# Patient Record
Sex: Female | Born: 1955
Health system: Southern US, Community
[De-identification: ages and names within clinical notes are randomized; demographics above are authoritative.]

## PROBLEM LIST (undated history)

## (undated) DIAGNOSIS — J189 Pneumonia, unspecified organism: Secondary | ICD-10-CM

## (undated) DIAGNOSIS — I639 Cerebral infarction, unspecified: Secondary | ICD-10-CM

## (undated) DIAGNOSIS — K766 Portal hypertension: Secondary | ICD-10-CM

## (undated) DIAGNOSIS — T4145XA Adverse effect of unspecified anesthetic, initial encounter: Secondary | ICD-10-CM

## (undated) DIAGNOSIS — R112 Nausea with vomiting, unspecified: Secondary | ICD-10-CM

## (undated) DIAGNOSIS — G459 Transient cerebral ischemic attack, unspecified: Secondary | ICD-10-CM

## (undated) DIAGNOSIS — G43909 Migraine, unspecified, not intractable, without status migrainosus: Secondary | ICD-10-CM

## (undated) DIAGNOSIS — K3189 Other diseases of stomach and duodenum: Secondary | ICD-10-CM

## (undated) DIAGNOSIS — G4700 Insomnia, unspecified: Secondary | ICD-10-CM

## (undated) DIAGNOSIS — F1011 Alcohol abuse, in remission: Secondary | ICD-10-CM

## (undated) DIAGNOSIS — B1920 Unspecified viral hepatitis C without hepatic coma: Secondary | ICD-10-CM

## (undated) DIAGNOSIS — Z9889 Other specified postprocedural states: Secondary | ICD-10-CM

## (undated) DIAGNOSIS — F319 Bipolar disorder, unspecified: Secondary | ICD-10-CM

## (undated) DIAGNOSIS — I8390 Asymptomatic varicose veins of unspecified lower extremity: Secondary | ICD-10-CM

## (undated) DIAGNOSIS — K219 Gastro-esophageal reflux disease without esophagitis: Secondary | ICD-10-CM

## (undated) DIAGNOSIS — J449 Chronic obstructive pulmonary disease, unspecified: Secondary | ICD-10-CM

## (undated) DIAGNOSIS — K7469 Other cirrhosis of liver: Secondary | ICD-10-CM

## (undated) DIAGNOSIS — B182 Chronic viral hepatitis C: Secondary | ICD-10-CM

## (undated) DIAGNOSIS — F17200 Nicotine dependence, unspecified, uncomplicated: Secondary | ICD-10-CM

## (undated) DIAGNOSIS — T8859XA Other complications of anesthesia, initial encounter: Secondary | ICD-10-CM

## (undated) DIAGNOSIS — F32A Depression, unspecified: Secondary | ICD-10-CM

## (undated) DIAGNOSIS — E039 Hypothyroidism, unspecified: Secondary | ICD-10-CM

## (undated) DIAGNOSIS — T7840XA Allergy, unspecified, initial encounter: Secondary | ICD-10-CM

## (undated) DIAGNOSIS — F329 Major depressive disorder, single episode, unspecified: Secondary | ICD-10-CM

## (undated) HISTORY — DX: Asymptomatic varicose veins of unspecified lower extremity: I83.90

## (undated) HISTORY — DX: Unspecified viral hepatitis C without hepatic coma: B19.20

## (undated) HISTORY — DX: Allergy, unspecified, initial encounter: T78.40XA

## (undated) HISTORY — DX: Bipolar disorder, unspecified: F31.9

## (undated) HISTORY — DX: Chronic viral hepatitis C: B18.2

## (undated) HISTORY — PX: APPENDECTOMY: SHX54

## (undated) HISTORY — DX: Migraine, unspecified, not intractable, without status migrainosus: G43.909

## (undated) HISTORY — DX: Unspecified viral hepatitis C without hepatic coma: K74.69

## (undated) HISTORY — PX: DIAGNOSTIC LAPAROSCOPY: SUR761

## (undated) HISTORY — DX: Other diseases of stomach and duodenum: K31.89

## (undated) HISTORY — PX: OTHER SURGICAL HISTORY: SHX169

## (undated) HISTORY — DX: Portal hypertension: K76.6

## (undated) HISTORY — DX: Alcohol abuse, in remission: F10.11

## (undated) HISTORY — DX: Insomnia, unspecified: G47.00

## (undated) HISTORY — DX: Nicotine dependence, unspecified, uncomplicated: F17.200

## (undated) HISTORY — DX: Cerebral infarction, unspecified: I63.9

## (undated) HISTORY — PX: LIVER BIOPSY: SHX301

## (undated) HISTORY — DX: Hypothyroidism, unspecified: E03.9

---

## 2008-07-20 ENCOUNTER — Emergency Department (HOSPITAL_COMMUNITY): Admission: EM | Admit: 2008-07-20 | Discharge: 2008-07-20 | Payer: Self-pay | Admitting: Family Medicine

## 2009-03-08 ENCOUNTER — Other Ambulatory Visit: Payer: Self-pay | Admitting: Specialist

## 2009-07-05 ENCOUNTER — Ambulatory Visit: Payer: Self-pay

## 2010-05-09 ENCOUNTER — Encounter (INDEPENDENT_AMBULATORY_CARE_PROVIDER_SITE_OTHER): Payer: Self-pay | Admitting: *Deleted

## 2010-05-09 ENCOUNTER — Other Ambulatory Visit (HOSPITAL_COMMUNITY): Payer: Self-pay | Admitting: Family Medicine

## 2010-05-09 DIAGNOSIS — K7031 Alcoholic cirrhosis of liver with ascites: Secondary | ICD-10-CM

## 2010-05-09 LAB — CONVERTED CEMR LAB
BUN: 15 mg/dL (ref 6–23)
Basophils Absolute: 0 10*3/uL (ref 0.0–0.1)
Basophils Relative: 0 % (ref 0–1)
CO2: 21 meq/L (ref 19–32)
Cholesterol: 165 mg/dL (ref 0–200)
Creatinine, Ser: 0.66 mg/dL (ref 0.40–1.20)
Eosinophils Absolute: 0.1 10*3/uL (ref 0.0–0.7)
Eosinophils Relative: 2 % (ref 0–5)
Glucose, Bld: 107 mg/dL — ABNORMAL HIGH (ref 70–99)
HCT: 51.1 % — ABNORMAL HIGH (ref 36.0–46.0)
HDL: 59 mg/dL (ref >39)
HIV: NONREACTIVE
Hemoglobin: 17.3 g/dL — ABNORMAL HIGH (ref 12.0–15.0)
Hep B S Ab: POSITIVE — AB
MCHC: 33.9 g/dL (ref 30.0–36.0)
MCV: 93.2 fL (ref 78.0–100.0)
Monocytes Absolute: 0.7 10*3/uL (ref 0.1–1.0)
Platelets: 142 10*3/uL — ABNORMAL LOW (ref 150–400)
RDW: 13.7 % (ref 11.5–15.5)
Sodium: 138 meq/L (ref 135–145)
T4, Total: 16.1 ug/dL — ABNORMAL HIGH (ref 5.0–12.5)
Total Bilirubin: 1 mg/dL (ref 0.3–1.2)
Total Protein: 7.7 g/dL (ref 6.0–8.3)
Triglycerides: 115 mg/dL (ref <150)
VLDL: 23 mg/dL (ref 0–40)

## 2010-05-12 ENCOUNTER — Ambulatory Visit (HOSPITAL_COMMUNITY): Payer: Self-pay

## 2010-05-16 ENCOUNTER — Ambulatory Visit (HOSPITAL_COMMUNITY)
Admission: RE | Admit: 2010-05-16 | Discharge: 2010-05-16 | Disposition: A | Discharge status: Home / Self Care | Payer: Self-pay | Source: Ambulatory Visit | Attending: Family Medicine | Admitting: Family Medicine

## 2010-05-16 DIAGNOSIS — R188 Other ascites: Secondary | ICD-10-CM | POA: Insufficient documentation

## 2010-05-16 DIAGNOSIS — B1920 Unspecified viral hepatitis C without hepatic coma: Secondary | ICD-10-CM | POA: Insufficient documentation

## 2010-05-16 DIAGNOSIS — K7460 Unspecified cirrhosis of liver: Secondary | ICD-10-CM | POA: Insufficient documentation

## 2010-05-16 DIAGNOSIS — K7031 Alcoholic cirrhosis of liver with ascites: Secondary | ICD-10-CM

## 2010-05-30 ENCOUNTER — Ambulatory Visit (HOSPITAL_COMMUNITY)
Admission: RE | Admit: 2010-05-30 | Discharge: 2010-05-30 | Disposition: A | Discharge status: Home / Self Care | Payer: Self-pay | Source: Ambulatory Visit | Attending: Family Medicine | Admitting: Family Medicine

## 2010-05-30 ENCOUNTER — Other Ambulatory Visit (HOSPITAL_COMMUNITY): Payer: Self-pay | Admitting: Family Medicine

## 2010-05-30 DIAGNOSIS — R52 Pain, unspecified: Secondary | ICD-10-CM

## 2010-05-30 DIAGNOSIS — M79609 Pain in unspecified limb: Secondary | ICD-10-CM | POA: Insufficient documentation

## 2010-07-18 ENCOUNTER — Other Ambulatory Visit (HOSPITAL_COMMUNITY): Payer: Self-pay | Admitting: Family Medicine

## 2010-07-18 ENCOUNTER — Ambulatory Visit: Payer: Self-pay

## 2010-07-18 DIAGNOSIS — E049 Nontoxic goiter, unspecified: Secondary | ICD-10-CM

## 2010-07-25 ENCOUNTER — Ambulatory Visit (HOSPITAL_COMMUNITY)
Admission: RE | Admit: 2010-07-25 | Discharge: 2010-07-25 | Disposition: A | Discharge status: Home / Self Care | Payer: Self-pay | Source: Ambulatory Visit | Attending: Family Medicine | Admitting: Family Medicine

## 2010-07-25 DIAGNOSIS — E042 Nontoxic multinodular goiter: Secondary | ICD-10-CM | POA: Insufficient documentation

## 2010-07-25 DIAGNOSIS — E049 Nontoxic goiter, unspecified: Secondary | ICD-10-CM

## 2010-08-01 ENCOUNTER — Other Ambulatory Visit (HOSPITAL_COMMUNITY): Payer: Self-pay | Admitting: Family Medicine

## 2010-08-01 ENCOUNTER — Other Ambulatory Visit: Payer: Self-pay | Admitting: Family Medicine

## 2010-08-01 DIAGNOSIS — E042 Nontoxic multinodular goiter: Secondary | ICD-10-CM

## 2010-08-15 ENCOUNTER — Other Ambulatory Visit (HOSPITAL_COMMUNITY): Payer: Self-pay | Admitting: Family Medicine

## 2010-08-15 ENCOUNTER — Ambulatory Visit (HOSPITAL_COMMUNITY)
Admission: RE | Admit: 2010-08-15 | Discharge: 2010-08-15 | Disposition: A | Discharge status: Home / Self Care | Payer: Self-pay | Source: Ambulatory Visit | Attending: Family Medicine | Admitting: Family Medicine

## 2010-08-15 ENCOUNTER — Other Ambulatory Visit: Payer: Self-pay | Admitting: Interventional Radiology

## 2010-08-15 DIAGNOSIS — E042 Nontoxic multinodular goiter: Secondary | ICD-10-CM

## 2010-08-15 DIAGNOSIS — E049 Nontoxic goiter, unspecified: Secondary | ICD-10-CM | POA: Insufficient documentation

## 2010-10-03 ENCOUNTER — Ambulatory Visit (INDEPENDENT_AMBULATORY_CARE_PROVIDER_SITE_OTHER): Payer: PRIVATE HEALTH INSURANCE | Admitting: Surgery

## 2010-10-03 ENCOUNTER — Encounter (INDEPENDENT_AMBULATORY_CARE_PROVIDER_SITE_OTHER): Payer: Self-pay | Admitting: Surgery

## 2010-10-03 VITALS — BP 108/68 | HR 68 | Temp 96.8°F | Ht 64.0 in | Wt 152.0 lb

## 2010-10-03 DIAGNOSIS — E049 Nontoxic goiter, unspecified: Secondary | ICD-10-CM

## 2010-10-03 NOTE — Progress Notes (Signed)
Chief Complaint  Patient presents with  . Thyroid Nodule    multinodular goiter with compressive symptoms    HISTORY: Patient is a 55 year old white female with long-standing multinodular thyroid goiter. This is been present for many years and was previously followed in [place]. Patient has never been on thyroid medication. She's had no prior head or neck surgery. There is no immediate family history of thyroid disease. There is no family history mother and her colopathy. Patient underwent thyroid ultrasound at [facility] imaging in May 2012. This showed an enlarged right thyroid lobe measuring 5.9 cm and an enlarged left thyroid lobe measuring 8.0 cm. Dominant nodules were noted bilaterally. Patient underwent ultrasound-guided fine-needle aspiration biopsies. Cytopathology was benign. Patient is now referred for evaluation for possible thyroidectomy based on compressive symptoms.   Past Medical History  Diagnosis Date  . Weight gain   . Fatigue   . Hep A w/o coma   . Hep C w/ coma, chronic   . Wears dentures   . Rash     and bruises easily  . Cough with sputum   . NVD (normal vaginal delivery)   . Poor appetite   . Liver disease   . Migraines   . Wears glasses   . Arthritis      Current outpatient prescriptions:benztropine (COGENTIN) 0.5 MG tablet, Take 0.5 mg by mouth daily.  , Disp: , Rfl: ;  citalopram (CELEXA) 40 MG tablet, Take 40 mg by mouth daily.  , Disp: , Rfl: ;  haloperidol (HALDOL) 1 MG tablet, Take 1 mg by mouth 2 (two) times daily.  , Disp: , Rfl: ;  hydrOXYzine (VISTARIL) 50 MG capsule, Take 50 mg by mouth Nightly.  , Disp: , Rfl:  lamoTRIgine (LAMICTAL) 150 MG tablet, Take 150 mg by mouth daily. Patient taking 1.5 daily  , Disp: , Rfl: ;  zolpidem (AMBIEN) 10 MG tablet, Take 10 mg by mouth at bedtime as needed.  , Disp: , Rfl:    Allergies  Allergen Reactions  . Demerol Anaphylaxis  . Penicillins Anaphylaxis, Hives and Itching  . Percocet  (Oxycodone-Acetaminophen) Hives, Shortness Of Breath and Itching  . Percodan (Oxycodone-Aspirin) Hives, Shortness Of Breath and Itching  . Aspirin Nausea And Vomiting  . Morphine And Related Other (See Comments)    Unknown by patient has been a while since reaction has occurred.     Family History  Problem Relation Age of Onset  . Other Mother     pacemaker  . Cancer Father     bladder  . Diabetes Father   . Hypertension Father      History  Substance Use Topics  . Smoking status: Current Everyday Smoker -- 0.5 packs/day  . Smokeless tobacco: Not on file  . Alcohol Use: Yes     occasional     PERTINENT POSITIVES FROM REVIEW OF SYSTEMS: Patient notes moderate compressive symptoms including dysphagia with most meals, occasional choking sensation, nocturnal shortness of breath awakening her from sleep at least twice weekly, and a persistent moderate globus sensation. Patient denies tremors. She denies palpitations.   EXAM: Filed Vitals:   10/03/10 1649  BP: 108/68  Pulse: 68  Temp: 96.8 F (36 C)   HEENT shows her to be normocephalic. Sclerae clear. Pupils equal and reactive. Dentition fair. Mucous membranes moist. Voice quality normal. Neck shows obvious asymmetry with the left thyroid lobe being markedly larger than the right palpation reveals a firm multinodular thyroid gland larger on the left than on  the right. It is mildly tender. There is no associated lymphadenopathy. There is no anterior or posterior cervical lymphadenopathy. There are no supraclavicular masses. Chest is clear to auscultation with a few scattered rhonchi. Cardiac exam shows regular rate and rhythm without significant murmur Extremities are nontender without edema. No tenderness. Peripheral pulses are full. Neurologically the patient is intact without focal deficit. There's no sign of tremor.   LABORATORY RESULTS: See E-Chart for most recent results   RADIOLOGY RESULTS: See E-Chart or I-Site  for most recent results   IMPRESSION: Multinodular thyroid goiter with benign cytopathology, moderate compressive symptoms   PLAN: The patient and I had a lengthy discussion regarding the above findings and symptoms. I explained to her that at this point she had essentially 2 options on how to proceed. First she could pursue a course of observation. This would require followup in approximately 6 months. We would repeat her thyroid ultrasound. We would monitor her thyroid function test it the laboratory. We would perform physical examinations. If her gland showed changes that were worrisome for neoplasm or if her symptoms of compression continued to become more severe, then she would require thyroidectomy.  The second option would be to proceed with thyroidectomy at this time. If the patient elected surgery I would recommend total thyroidectomy. We discussed the risk and benefits of the procedure including the risk of injury to parathyroid glands and the risk of injury to recurrent laryngeal nerves. We discussed the hospital stay to be anticipated and her recovery. We discussed the need for lifelong thyroid hormone replacement.  At this time the patient is not interested in proceeding immediately to surgery. She is comfortable with the plan for continued observation. We will obtain a TSH level at the laboratory. We will schedule her for followup in his office in 6 months.

## 2010-10-25 HISTORY — PX: ESOPHAGOGASTRODUODENOSCOPY: SHX1529

## 2011-03-06 ENCOUNTER — Ambulatory Visit (HOSPITAL_COMMUNITY)
Admission: RE | Admit: 2011-03-06 | Discharge: 2011-03-06 | Disposition: A | Discharge status: Home / Self Care | Payer: Self-pay | Source: Ambulatory Visit | Attending: Family Medicine | Admitting: Family Medicine

## 2011-03-06 ENCOUNTER — Other Ambulatory Visit (HOSPITAL_COMMUNITY): Payer: Self-pay | Admitting: Family Medicine

## 2011-03-06 DIAGNOSIS — R52 Pain, unspecified: Secondary | ICD-10-CM

## 2011-03-06 DIAGNOSIS — M25569 Pain in unspecified knee: Secondary | ICD-10-CM | POA: Insufficient documentation

## 2011-03-23 ENCOUNTER — Other Ambulatory Visit (INDEPENDENT_AMBULATORY_CARE_PROVIDER_SITE_OTHER): Payer: Self-pay | Admitting: Surgery

## 2011-03-23 ENCOUNTER — Ambulatory Visit
Admission: RE | Admit: 2011-03-23 | Discharge: 2011-03-23 | Disposition: A | Discharge status: Home / Self Care | Payer: No Typology Code available for payment source | Source: Ambulatory Visit | Attending: Surgery | Admitting: Surgery

## 2011-03-23 DIAGNOSIS — E042 Nontoxic multinodular goiter: Secondary | ICD-10-CM

## 2011-03-27 DIAGNOSIS — K3189 Other diseases of stomach and duodenum: Secondary | ICD-10-CM

## 2011-03-27 HISTORY — DX: Other diseases of stomach and duodenum: K31.89

## 2011-04-03 ENCOUNTER — Encounter (INDEPENDENT_AMBULATORY_CARE_PROVIDER_SITE_OTHER): Payer: PRIVATE HEALTH INSURANCE | Admitting: Surgery

## 2011-04-20 ENCOUNTER — Encounter (INDEPENDENT_AMBULATORY_CARE_PROVIDER_SITE_OTHER): Payer: Self-pay | Admitting: Surgery

## 2011-04-20 ENCOUNTER — Ambulatory Visit (INDEPENDENT_AMBULATORY_CARE_PROVIDER_SITE_OTHER): Payer: PRIVATE HEALTH INSURANCE | Admitting: Surgery

## 2011-04-20 VITALS — BP 124/76 | HR 72 | Temp 98.2°F | Resp 18 | Ht 64.0 in | Wt 151.2 lb

## 2011-04-20 DIAGNOSIS — E049 Nontoxic goiter, unspecified: Secondary | ICD-10-CM

## 2011-04-20 NOTE — Progress Notes (Signed)
Chief Complaint  Patient presents with  . Follow-up    thyroid goiter   HISTORY: Patient is a 55-year-old white female previously evaluated for bilateral thyroid nodules, multinodular goiter, and moderate compressive symptoms. At her last visit we discussed observation versus thyroidectomy. Patient decided to observe the gland with a followup thyroid ultrasound. Ultrasound was performed in December 2012 and shows interval increase in size of 3 dominant nodules in the right thyroid lobe and persistent abnormality of a markedly enlarged left thyroid lobe.  Patient believes the thyroid gland has enlarged over the past 6 months. She notes no problems with dysphagia had practically every meal. She complains of nighttime choking sensation and chronic cough. Patient now presents for consideration for thyroidectomy.  Past Medical History  Diagnosis Date  . Weight gain   . Fatigue   . Hep A w/o coma   . Hep C w/ coma, chronic   . Wears dentures   . Rash     and bruises easily  . Cough with sputum   . NVD (normal vaginal delivery)   . Poor appetite   . Liver disease   . Migraines   . Wears glasses   . Arthritis   . Thyroid disease     thyroid goiter  . Bipolar 1 disorder   . Insomnia      Current Outpatient Prescriptions  Medication Sig Dispense Refill  . benztropine (COGENTIN) 0.5 MG tablet Take 0.5 mg by mouth daily.        . citalopram (CELEXA) 40 MG tablet Take 40 mg by mouth daily.        . haloperidol (HALDOL) 1 MG tablet Take 1 mg by mouth 2 (two) times daily.        . hydrOXYzine (VISTARIL) 50 MG capsule Take 50 mg by mouth Nightly.        . lamoTRIgine (LAMICTAL) 150 MG tablet Take 150 mg by mouth daily. Patient taking 1.5 daily        . zolpidem (AMBIEN) 10 MG tablet Take 10 mg by mouth at bedtime as needed.           Allergies  Allergen Reactions  . Demerol Anaphylaxis  . Penicillins Anaphylaxis, Hives and Itching  . Percocet (Oxycodone-Acetaminophen) Hives,  Shortness Of Breath and Itching  . Percodan (Oxycodone-Aspirin) Hives, Shortness Of Breath and Itching  . Aspirin Nausea And Vomiting  . Morphine And Related Other (See Comments)    Unknown by patient has been a while since reaction has occurred.     Family History  Problem Relation Age of Onset  . Other Mother     pacemaker  . Cancer Father     bladder  . Diabetes Father   . Hypertension Father      History   Social History  . Marital Status: Legally Separated    Spouse Name: N/A    Number of Children: N/A  . Years of Education: N/A   Social History Main Topics  . Smoking status: Current Everyday Smoker -- 0.5 packs/day  . Smokeless tobacco: None  . Alcohol Use: Yes     occasional  . Drug Use: No  . Sexually Active: None   Other Topics Concern  . None   Social History Narrative  . None    REVIEW OF SYSTEMS - PERTINENT POSITIVES ONLY: Moderate dysphagia at most meals. Nocturnal choking sensation and shortness of breath. Chronic cough.  EXAM: Filed Vitals:   04/20/11 1015  BP: 124/76  Pulse: 72    Temp: 98.2 F (36.8 C)  Resp: 18    HEENT: normocephalic; pupils equal and reactive; sclerae clear; dentition good; mucous membranes moist NECK:  Enlarged thyroid with multiple nodules; dominant mass left lobe approx 6 cm in size; asymmetric on extension; no palpable anterior or posterior cervical lymphadenopathy; no supraclavicular masses; no tenderness CHEST: clear to auscultation bilaterally without rales, rhonchi, or wheezes CARDIAC: regular rate and rhythm without significant murmur; peripheral pulses are full ABDOMEN: soft without distension; bowel sounds present; no mass; no hepatosplenomegaly; no hernia EXT:  non-tender without edema; no deformity NEURO: no gross focal deficits; no sign of tremor   LABORATORY RESULTS: See [facility] ([facility]L-Epic) for most recent results   RADIOLOGY RESULTS: See [facility] ([facility]-Epic) for most recent  results   IMPRESSION: Multinodular thyroid goiter, interval enlargement of thyroid nodules, progression of compressive symptoms.  PLAN: The patient and I reviewed the above studies and issues. I have recommended proceeding with total thyroidectomy at this time. We again was reviewed the procedure of total thyroidectomy. We discussed the risk and benefits including potential for injury to laryngeal nerves and the parathyroid glands. We discussed the location of the surgical incision. We discussed the hospital stay to be anticipated. We discussed the need for lifelong thyroid hormone replacement therapy. Patient understands and wishes to proceed.  We will make arrangements for surgery in the near future.  The risks and benefits of the procedure have been discussed at length with the patient.  The patient understands the proposed procedure, potential alternative treatments, and the course of recovery to be expected.  All of the patient's questions have been answered at this time.  The patient wishes to proceed with surgery and will schedule a date for their procedure through our office staff.   Korbyn Vanes M. Taralynn Quiett, MD, FACS General & Endocrine Surgery [facility]   Visit Diagnoses: 1. Thyroid goiter, multinodular     Primary Care Physician: SHAW,EVA, MD, MD   

## 2011-04-20 NOTE — Patient Instructions (Signed)
[facility] [phone]  THYROID & PARATHYROID SURGERY -- POST OP INSTRUCTIONS  Always review your discharge instruction sheet from the facility where your surgery was performed.  1. A prescription for pain medication may be given to you upon discharge.  Take your pain medication as prescribed, if needed.  If narcotic pain medicine is not needed, then you may take acetaminophen (Tylenol) or ibuprofen (Advil) as needed. 2. Take your usually prescribed medications unless otherwise directed. 3. If you need a refill on your pain medication, please contact your pharmacy. They will contact our office to request authorization.  Prescriptions will not be processed after 5 pm or on weekends. 4. Start with a light diet upon arrival home, such as soup and crackers, etc.  Be sure to drink pleny of fluids daily.  Resume your normal diet the day after surgery. 5. Most patients will experience some swelling and bruising on the chest and neck area.  Ice packs will help.  Swelling and bruising can take several days to resolve.  6. It is common to experience some constipation if taking pain medication after surgery.  Increasing fluid intake and taking a stool softener will usually help or prevent this problem.  A mild laxative (Milk of Magnesia or Miralax) should be taken according to package directions if there are no bowel movements after 48 hours. 7. You may remove your bandages 24-48 hours after surgery, and you may shower at that time.  You have steri-strips (small skin tapes) in place directly over the incision.  These strips should be left on the skin for 7-10 days and then removed. 8. You may resume regular (light) daily activities beginning the next day-such as daily self-care, walking, climbing stairs-gradually increasing activities as tolerated.  You may have sexual intercourse when it is comfortable.  Refrain from any heavy lifting or straining until approved by your doctor.  You may drive  when you no longer are taking prescription pain medication, you can comfortably wear a seatbelt, and you can safely maneuver your car and apply brakes. 9. You should see your doctor in the office for a follow-up appointment approximately two weeks after your surgery.  Make sure that you call for this appointment within a day or two after you arrive home to insure a convenient appointment time.  WHEN TO CALL YOUR DOCTOR: 1. Fever over 101.5 2. Inability to urinate 3. Nausea and/or vomiting - persistent 4. Extreme swelling or bruising 5. Continued bleeding from incision 6. Increased pain, redness, or drainage from the incision 7. Difficulty swallowing or breathing 8. Muscle cramping or spasms 9. Numbness or tingling in hands or feet or around lips  The clinic staff is available to answer your questions during regular business hours.  Please don't hesitate to call and ask to speak to one of the nurses if you have concerns.  [email or web address]   

## 2011-05-14 ENCOUNTER — Encounter (HOSPITAL_COMMUNITY): Payer: Self-pay | Admitting: Pharmacy Technician

## 2011-05-14 ENCOUNTER — Ambulatory Visit (HOSPITAL_COMMUNITY)
Admission: RE | Admit: 2011-05-14 | Discharge: 2011-05-14 | Disposition: A | Discharge status: Home / Self Care | Payer: Self-pay | Source: Ambulatory Visit | Attending: Surgery | Admitting: Surgery

## 2011-05-14 ENCOUNTER — Other Ambulatory Visit: Payer: Self-pay

## 2011-05-14 ENCOUNTER — Encounter (HOSPITAL_COMMUNITY): Payer: Self-pay

## 2011-05-14 ENCOUNTER — Encounter (HOSPITAL_COMMUNITY)
Admission: RE | Admit: 2011-05-14 | Discharge: 2011-05-14 | Disposition: A | Discharge status: Home / Self Care | Payer: Self-pay | Source: Ambulatory Visit | Attending: Surgery | Admitting: Surgery

## 2011-05-14 DIAGNOSIS — Z01811 Encounter for preprocedural respiratory examination: Secondary | ICD-10-CM | POA: Insufficient documentation

## 2011-05-14 DIAGNOSIS — F319 Bipolar disorder, unspecified: Secondary | ICD-10-CM | POA: Insufficient documentation

## 2011-05-14 DIAGNOSIS — K7460 Unspecified cirrhosis of liver: Secondary | ICD-10-CM | POA: Insufficient documentation

## 2011-05-14 DIAGNOSIS — Z01812 Encounter for preprocedural laboratory examination: Secondary | ICD-10-CM | POA: Insufficient documentation

## 2011-05-14 DIAGNOSIS — E042 Nontoxic multinodular goiter: Secondary | ICD-10-CM | POA: Insufficient documentation

## 2011-05-14 DIAGNOSIS — K219 Gastro-esophageal reflux disease without esophagitis: Secondary | ICD-10-CM | POA: Insufficient documentation

## 2011-05-14 DIAGNOSIS — B1920 Unspecified viral hepatitis C without hepatic coma: Secondary | ICD-10-CM | POA: Insufficient documentation

## 2011-05-14 HISTORY — DX: Gastro-esophageal reflux disease without esophagitis: K21.9

## 2011-05-14 HISTORY — DX: Depression, unspecified: F32.A

## 2011-05-14 HISTORY — DX: Other specified postprocedural states: R11.2

## 2011-05-14 HISTORY — DX: Major depressive disorder, single episode, unspecified: F32.9

## 2011-05-14 HISTORY — DX: Other complications of anesthesia, initial encounter: T88.59XA

## 2011-05-14 HISTORY — DX: Other specified postprocedural states: Z98.890

## 2011-05-14 HISTORY — DX: Adverse effect of unspecified anesthetic, initial encounter: T41.45XA

## 2011-05-14 LAB — CBC
HCT: 44.5 % (ref 36.0–46.0)
Hemoglobin: 15.3 g/dL — ABNORMAL HIGH (ref 12.0–15.0)
MCV: 91.2 fL (ref 78.0–100.0)
RDW: 13.1 % (ref 11.5–15.5)
WBC: 5.3 10*3/uL (ref 4.0–10.5)

## 2011-05-14 LAB — COMPREHENSIVE METABOLIC PANEL
Alkaline Phosphatase: 77 U/L (ref 39–117)
BUN: 12 mg/dL (ref 6–23)
CO2: 27 mEq/L (ref 19–32)
Chloride: 102 mEq/L (ref 96–112)
Creatinine, Ser: 0.73 mg/dL (ref 0.50–1.10)
GFR calc Af Amer: >90 mL/min (ref >90)
GFR calc non Af Amer: >90 mL/min (ref >90)
Glucose, Bld: 97 mg/dL (ref 70–99)
Potassium: 4.2 mEq/L (ref 3.5–5.1)
Total Bilirubin: 0.8 mg/dL (ref 0.3–1.2)

## 2011-05-14 LAB — SURGICAL PCR SCREEN: Staphylococcus aureus: INVALID — AB

## 2011-05-14 NOTE — Pre-Procedure Instructions (Signed)
Per LAB- INVALID MRSA RESULTS- SENT OFF TO LAB FOR FURTHER STUDIES

## 2011-05-14 NOTE — Pre-Procedure Instructions (Signed)
1610- abnormal platelets called to Dr Sid Falcon nurse Zella Ball at [facility]

## 2011-05-14 NOTE — Patient Instructions (Signed)
20 [name]  05/14/2011   Your procedure is scheduled on:  05/17/11  Thursday  Surgery [id number]  Report to [facility] at   0515    AM.  Call this number if you have problems the morning of surgery: [phone]      Remember:  STOP ASPIRINS,ANTIINFLAMMATORIES,VITAMINS 7 DAYS BEFORE SURGERY   Do not eat food:After Midnight. Wednesday night  May have clear liquids: none after MIDNIGHT Wednesday NIGHT  Clear liquids include soda, tea, black coffee, apple or grape juice, broth.  Take these medicines the morning of surgery with A SIP OF WATER: COGENTIN,CELEXA,HALDOL,LAMICTEL WITH SIP WATER   Do not wear jewelry, make-up or nail polish.  Do not wear lotions, powders, or perfumes. You may wear deodorant.  Do not shave 48 hours prior to surgery.  Do not bring valuables to the hospital.  Contacts, dentures or bridgework may not be worn into surgery.  Leave suitcase in the car. After surgery it may be brought to your room.  For patients admitted to the hospital, checkout time is 11:00 AM the day of discharge.   Patients discharged the day of surgery will not be allowed to drive home.  Name and phone number of your driver:    friend                                                                  Special Instructions: CHG Shower Use Special Wash: 1/2 bottle night before surgery and 1/2 bottle morning of surgery. REGULAR SOAP FACE AND PRIVATES              LADIES- NO SHAVING 48 HOURS BEFORE USING BETASEPT SOAP.                   Please read over the following fact sheets that you were given: MRSA Information

## 2011-05-15 ENCOUNTER — Encounter (HOSPITAL_COMMUNITY): Payer: Self-pay

## 2011-05-15 NOTE — Progress Notes (Signed)
Quick Note:  These results are acceptable for scheduled surgery. TMG ______ 

## 2011-05-15 NOTE — Progress Notes (Signed)
Faxed to [facility] 05/15/2011.

## 2011-05-15 NOTE — Progress Notes (Signed)
Faxed to [facility] 05/15/2011. 

## 2011-05-16 NOTE — Pre-Procedure Instructions (Signed)
Per lab at 1525-  mrsa result still preliminary- chart flagged for checking by short stay nurses

## 2011-05-16 NOTE — Anesthesia Preprocedure Evaluation (Addendum)
Anesthesia Evaluation  Patient identified by MRN, date of birth, ID band Patient awake    Reviewed: Allergy & Precautions, H&P , NPO status , Patient's Chart, lab work & pertinent test results  History of Anesthesia Complications (+) PONV and PROLONGED EMERGENCE  Airway Mallampati: II TM Distance: >3 FB Neck ROM: full    Dental  (+) Lower Dentures and Upper Dentures   Pulmonary Current Smoker,  clear to auscultation  Pulmonary exam normal       Cardiovascular Exercise Tolerance: Good neg cardio ROS regular Normal    Neuro/Psych  Headaches, PSYCHIATRIC DISORDERS Depression Bipolar Disorder Negative Neurological ROS  Negative Psych ROS   GI/Hepatic negative GI ROS, GERD-  ,(+) Cirrhosis -       , Hepatitis -, C  Endo/Other  Negative Endocrine ROS  Renal/GU negative Renal ROS  Genitourinary negative   Musculoskeletal   Abdominal   Peds  Hematology negative hematology ROS (+)   Anesthesia Other Findings   Reproductive/Obstetrics negative OB ROS                          Anesthesia Physical Anesthesia Plan  ASA: III  Anesthesia Plan: General   Post-op Pain Management:    Induction: Intravenous  Airway Management Planned: Oral ETT  Additional Equipment:   Intra-op Plan:   Post-operative Plan: Extubation in OR  Informed Consent: I have reviewed the patients History and Physical, chart, labs and discussed the procedure including the risks, benefits and alternatives for the proposed anesthesia with the patient or authorized representative who has indicated his/her understanding and acceptance.   Dental Advisory Given  Plan Discussed with: CRNA and Surgeon  Anesthesia Plan Comments: (Major problems with narcotics in past including anaphylaxis with demerol and hives with oxycodone.)       Anesthesia Quick Evaluation

## 2011-05-17 ENCOUNTER — Ambulatory Visit (HOSPITAL_COMMUNITY)
Admission: RE | Admit: 2011-05-17 | Discharge: 2011-05-20 | Disposition: A | Discharge status: Home Health | Payer: Medicaid Other | Source: Ambulatory Visit | Attending: Surgery | Admitting: Surgery

## 2011-05-17 ENCOUNTER — Other Ambulatory Visit (INDEPENDENT_AMBULATORY_CARE_PROVIDER_SITE_OTHER): Payer: Self-pay | Admitting: Surgery

## 2011-05-17 ENCOUNTER — Ambulatory Visit (HOSPITAL_COMMUNITY): Payer: Medicaid Other | Admitting: Anesthesiology

## 2011-05-17 ENCOUNTER — Encounter (HOSPITAL_COMMUNITY)
Admission: RE | Disposition: A | Discharge status: Home Health | Payer: Self-pay | Source: Ambulatory Visit | Attending: Surgery

## 2011-05-17 ENCOUNTER — Encounter (HOSPITAL_COMMUNITY): Payer: Self-pay | Admitting: *Deleted

## 2011-05-17 ENCOUNTER — Encounter (HOSPITAL_COMMUNITY): Payer: Self-pay | Admitting: Anesthesiology

## 2011-05-17 DIAGNOSIS — Z8619 Personal history of other infectious and parasitic diseases: Secondary | ICD-10-CM | POA: Insufficient documentation

## 2011-05-17 DIAGNOSIS — Z79899 Other long term (current) drug therapy: Secondary | ICD-10-CM | POA: Insufficient documentation

## 2011-05-17 DIAGNOSIS — R5383 Other fatigue: Secondary | ICD-10-CM | POA: Insufficient documentation

## 2011-05-17 DIAGNOSIS — R5381 Other malaise: Secondary | ICD-10-CM | POA: Insufficient documentation

## 2011-05-17 DIAGNOSIS — E042 Nontoxic multinodular goiter: Secondary | ICD-10-CM

## 2011-05-17 DIAGNOSIS — E049 Nontoxic goiter, unspecified: Secondary | ICD-10-CM

## 2011-05-17 DIAGNOSIS — F319 Bipolar disorder, unspecified: Secondary | ICD-10-CM | POA: Insufficient documentation

## 2011-05-17 DIAGNOSIS — Z01812 Encounter for preprocedural laboratory examination: Secondary | ICD-10-CM | POA: Insufficient documentation

## 2011-05-17 DIAGNOSIS — R635 Abnormal weight gain: Secondary | ICD-10-CM | POA: Insufficient documentation

## 2011-05-17 HISTORY — PX: THYROIDECTOMY: SHX17

## 2011-05-17 LAB — MRSA CULTURE

## 2011-05-17 SURGERY — THYROIDECTOMY
Anesthesia: General | Site: Neck | Wound class: Clean

## 2011-05-17 MED ORDER — DEXAMETHASONE SODIUM PHOSPHATE 10 MG/ML IJ SOLN
INTRAMUSCULAR | Status: DC | PRN
  Administered 2011-05-17: 8 mg via INTRAVENOUS

## 2011-05-17 MED ORDER — NEOSTIGMINE METHYLSULFATE 1 MG/ML IJ SOLN
INTRAMUSCULAR | Status: DC | PRN
  Administered 2011-05-17: 3 mg via INTRAVENOUS

## 2011-05-17 MED ORDER — ZOLPIDEM TARTRATE 10 MG PO TABS: 10.0000 mg | ORAL_TABLET | Freq: Every evening | ORAL | Status: DC | PRN

## 2011-05-17 MED ORDER — PROMETHAZINE HCL 25 MG/ML IJ SOLN: 6.2500 mg | INTRAMUSCULAR | Status: DC | PRN

## 2011-05-17 MED ORDER — LACTATED RINGERS IV SOLN: INTRAVENOUS | Status: DC

## 2011-05-17 MED ORDER — PROPOFOL 10 MG/ML IV BOLUS
INTRAVENOUS | Status: DC | PRN
  Administered 2011-05-17: 150 mg via INTRAVENOUS

## 2011-05-17 MED ORDER — HALOPERIDOL 1 MG PO TABS
1.0000 mg | ORAL_TABLET | Freq: Three times a day (TID) | ORAL | Status: DC
  Filled 2011-05-17 (×11): qty 1

## 2011-05-17 MED ORDER — PROMETHAZINE HCL 25 MG/ML IJ SOLN
12.5000 mg | Freq: Four times a day (QID) | INTRAMUSCULAR | Status: DC | PRN
  Administered 2011-05-18: 12.5 mg via INTRAVENOUS
  Filled 2011-05-17: qty 1

## 2011-05-17 MED ORDER — ROCURONIUM BROMIDE 100 MG/10ML IV SOLN
INTRAVENOUS | Status: DC | PRN
  Administered 2011-05-17: 30 mg via INTRAVENOUS

## 2011-05-17 MED ORDER — CALCIUM CARBONATE 1250 (500 CA) MG PO TABS
1000.0000 mg | ORAL_TABLET | Freq: Three times a day (TID) | ORAL | Status: DC
  Filled 2011-05-17 (×6): qty 2

## 2011-05-17 MED ORDER — FENTANYL CITRATE 0.05 MG/ML IJ SOLN
25.0000 ug | INTRAMUSCULAR | Status: DC | PRN
  Administered 2011-05-17: 50 ug via INTRAVENOUS

## 2011-05-17 MED ORDER — CITALOPRAM HYDROBROMIDE 40 MG PO TABS
40.0000 mg | ORAL_TABLET | Freq: Every day | ORAL | Status: DC
  Administered 2011-05-17: 40 mg via ORAL
  Filled 2011-05-17 (×4): qty 1

## 2011-05-17 MED ORDER — MIDAZOLAM HCL 5 MG/5ML IJ SOLN
INTRAMUSCULAR | Status: DC | PRN
  Administered 2011-05-17: 2 mg via INTRAVENOUS

## 2011-05-17 MED ORDER — DROPERIDOL 2.5 MG/ML IJ SOLN
INTRAMUSCULAR | Status: DC | PRN
  Administered 2011-05-17: 0.625 mg via INTRAVENOUS

## 2011-05-17 MED ORDER — HYDROXYZINE PAMOATE 50 MG PO CAPS: 50.0000 mg | ORAL_CAPSULE | Freq: Every day | ORAL | Status: DC

## 2011-05-17 MED ORDER — HYDROXYZINE HCL 25 MG PO TABS
25.0000 mg | ORAL_TABLET | Freq: Every day | ORAL | Status: DC
  Filled 2011-05-17 (×4): qty 1

## 2011-05-17 MED ORDER — KCL IN DEXTROSE-NACL 20-5-0.45 MEQ/L-%-% IV SOLN
INTRAVENOUS | Status: DC
  Administered 2011-05-17 – 2011-05-19 (×3): via INTRAVENOUS
  Filled 2011-05-17 (×4): qty 1000

## 2011-05-17 MED ORDER — ONDANSETRON HCL 4 MG/2ML IJ SOLN
INTRAMUSCULAR | Status: DC | PRN
  Administered 2011-05-17: 4 mg via INTRAVENOUS

## 2011-05-17 MED ORDER — ACETAMINOPHEN 325 MG PO TABS
650.0000 mg | ORAL_TABLET | ORAL | Status: DC | PRN
  Administered 2011-05-18 – 2011-05-19 (×2): 650 mg via ORAL
  Filled 2011-05-17 (×2): qty 2

## 2011-05-17 MED ORDER — LACTATED RINGERS IV SOLN
INTRAVENOUS | Status: DC | PRN
  Administered 2011-05-17: 07:00:00 via INTRAVENOUS

## 2011-05-17 MED ORDER — GLYCOPYRROLATE 0.2 MG/ML IJ SOLN
INTRAMUSCULAR | Status: DC | PRN
  Administered 2011-05-17: .4 mg via INTRAVENOUS

## 2011-05-17 MED ORDER — LIDOCAINE HCL (CARDIAC) 20 MG/ML IV SOLN
INTRAVENOUS | Status: DC | PRN
  Administered 2011-05-17: 75 mg via INTRAVENOUS

## 2011-05-17 MED ORDER — LAMOTRIGINE 150 MG PO TABS
150.0000 mg | ORAL_TABLET | Freq: Every day | ORAL | Status: DC
  Administered 2011-05-17: 150 mg via ORAL
  Filled 2011-05-17 (×4): qty 1

## 2011-05-17 MED ORDER — BENZTROPINE MESYLATE 0.5 MG PO TABS
0.5000 mg | ORAL_TABLET | Freq: Every day | ORAL | Status: DC
  Administered 2011-05-17: 0.5 mg via ORAL
  Filled 2011-05-17 (×4): qty 1

## 2011-05-17 MED ORDER — FENTANYL CITRATE 0.05 MG/ML IJ SOLN
INTRAMUSCULAR | Status: DC | PRN
  Administered 2011-05-17: 100 ug via INTRAVENOUS
  Administered 2011-05-17 (×2): 50 ug via INTRAVENOUS
  Administered 2011-05-17: 100 ug via INTRAVENOUS
  Administered 2011-05-17: 50 ug via INTRAVENOUS

## 2011-05-17 MED ORDER — TRAMADOL HCL 50 MG PO TABS: 50.0000 mg | ORAL_TABLET | Freq: Four times a day (QID) | ORAL | Status: DC | PRN

## 2011-05-17 MED ORDER — CIPROFLOXACIN IN D5W 400 MG/200ML IV SOLN
400.0000 mg | INTRAVENOUS | Status: AC
  Administered 2011-05-17: 400 mg via INTRAVENOUS

## 2011-05-17 SURGICAL SUPPLY — 40 items
ATTRACTOMAT 16X20 MAGNETIC DRP (DRAPES) ×2 IMPLANT
BENZOIN TINCTURE PRP APPL 2/3 (GAUZE/BANDAGES/DRESSINGS) ×2 IMPLANT
BLADE HEX COATED 2.75 (ELECTRODE) ×2 IMPLANT
BLADE SURG 15 STRL LF DISP TIS (BLADE) ×1 IMPLANT
BLADE SURG 15 STRL SS (BLADE) ×1
CANISTER SUCTION 2500CC (MISCELLANEOUS) ×2 IMPLANT
CHLORAPREP W/TINT 10.5 ML (MISCELLANEOUS) ×2 IMPLANT
CLIP TI MEDIUM 6 (CLIP) ×4 IMPLANT
CLIP TI WIDE RED SMALL 6 (CLIP) ×4 IMPLANT
CLOSURE STERI STRIP 1/2 X4 (GAUZE/BANDAGES/DRESSINGS) ×2 IMPLANT
CLOTH BEACON ORANGE TIMEOUT ST (SAFETY) ×2 IMPLANT
DISSECTOR ROUND CHERRY 3/8 STR (MISCELLANEOUS) IMPLANT
DRAPE PED LAPAROTOMY (DRAPES) ×2 IMPLANT
DRESSING SURGICEL FIBRLLR 1X2 (HEMOSTASIS) ×1 IMPLANT
DRSG SURGICEL FIBRILLAR 1X2 (HEMOSTASIS) ×2
ELECT REM PT RETURN 9FT ADLT (ELECTROSURGICAL) ×2
ELECTRODE REM PT RTRN 9FT ADLT (ELECTROSURGICAL) ×1 IMPLANT
GAUZE SPONGE 4X4 16PLY XRAY LF (GAUZE/BANDAGES/DRESSINGS) ×2 IMPLANT
GLOVE SURG ORTHO 8.0 STRL STRW (GLOVE) ×2 IMPLANT
GOWN STRL NON-REIN LRG LVL3 (GOWN DISPOSABLE) ×2 IMPLANT
GOWN STRL REIN XL XLG (GOWN DISPOSABLE) ×4 IMPLANT
KIT BASIN OR (CUSTOM PROCEDURE TRAY) ×2 IMPLANT
NS IRRIG 1000ML POUR BTL (IV SOLUTION) ×2 IMPLANT
PACK BASIC VI WITH GOWN DISP (CUSTOM PROCEDURE TRAY) ×2 IMPLANT
PENCIL BUTTON HOLSTER BLD 10FT (ELECTRODE) ×2 IMPLANT
SHEARS HARMONIC 9CM CVD (BLADE) ×2 IMPLANT
SPONGE GAUZE 4X4 12PLY (GAUZE/BANDAGES/DRESSINGS) IMPLANT
SPONGE GAUZE 4X4 FOR O.R. (GAUZE/BANDAGES/DRESSINGS) ×2 IMPLANT
STAPLER VISISTAT 35W (STAPLE) ×2 IMPLANT
STRIP CLOSURE SKIN 1/2X4 (GAUZE/BANDAGES/DRESSINGS) ×2 IMPLANT
SUT MNCRL AB 4-0 PS2 18 (SUTURE) ×2 IMPLANT
SUT SILK 2 0 (SUTURE) ×1
SUT SILK 2-0 18XBRD TIE 12 (SUTURE) ×1 IMPLANT
SUT SILK 3 0 (SUTURE)
SUT SILK 3-0 18XBRD TIE 12 (SUTURE) IMPLANT
SUT VIC AB 3-0 SH 18 (SUTURE) ×2 IMPLANT
SYR BULB IRRIGATION 50ML (SYRINGE) ×2 IMPLANT
TAPE CLOTH SURG 4X10 WHT LF (GAUZE/BANDAGES/DRESSINGS) ×2 IMPLANT
TOWEL OR 17X26 10 PK STRL BLUE (TOWEL DISPOSABLE) ×2 IMPLANT
YANKAUER SUCT BULB TIP 10FT TU (MISCELLANEOUS) ×2 IMPLANT

## 2011-05-17 NOTE — Progress Notes (Signed)
Pt is alert and oriented, pt is extremely agitated, pt refuses meds pt is also complaining of nausea but is refuses to receive anything for it, pt uncooperative Means, Metztli Sachdev N 05-17-11 18:32pm

## 2011-05-17 NOTE — Interval H&P Note (Signed)
History and Physical Interval Note:  05/17/2011 7:33 AM  [name]  has presented today for surgery, with the diagnosis of multinodular goiter.  The various methods of treatment have been discussed with the patient and family. After consideration of risks, benefits and other options for treatment, the patient has consented to    Procedure(s) (LRB): THYROIDECTOMY (N/A) as a surgical intervention .    The patients' history has been reviewed, patient examined, no change in status, stable for surgery.  I have reviewed the patients' chart and labs.  Questions were answered to the patient's satisfaction.    Velora Heckler, MD, FACS General & Endocrine Surgery [facility]  Ollie Delano Judie Petit

## 2011-05-17 NOTE — Plan of Care (Signed)
Problem: Phase I Progression Outcomes Goal: Incision/dressings dry and intact Outcome: Progressing Dressing to anterior neck is CDI

## 2011-05-17 NOTE — Anesthesia Postprocedure Evaluation (Signed)
  Anesthesia Post-op Note  Patient: [name]  Procedure(s) Performed: Procedure(s) (LRB): THYROIDECTOMY (N/A)  Patient Location: PACU  Anesthesia Type: General  Level of Consciousness: awake and alert   Airway and Oxygen Therapy: Patient Spontanous Breathing  Post-op Pain: mild  Post-op Assessment: Post-op Vital signs reviewed, Patient's Cardiovascular Status Stable, Respiratory Function Stable, Patent Airway and No signs of Nausea or vomiting  Post-op Vital Signs: stable  Complications: No apparent anesthesia complications

## 2011-05-17 NOTE — H&P (View-Only) (Signed)
Chief Complaint  Patient presents with  . Follow-up    thyroid goiter   HISTORY: Patient is a 56 year old white female previously evaluated for bilateral thyroid nodules, multinodular goiter, and moderate compressive symptoms. At her last visit we discussed observation versus thyroidectomy. Patient decided to observe the gland with a followup thyroid ultrasound. Ultrasound was performed in December 2012 and shows interval increase in size of 3 dominant nodules in the right thyroid lobe and persistent abnormality of a markedly enlarged left thyroid lobe.  Patient believes the thyroid gland has enlarged over the past 6 months. She notes no problems with dysphagia had practically every meal. She complains of nighttime choking sensation and chronic cough. Patient now presents for consideration for thyroidectomy.  Past Medical History  Diagnosis Date  . Weight gain   . Fatigue   . Hep A w/o coma   . Hep C w/ coma, chronic   . Wears dentures   . Rash     and bruises easily  . Cough with sputum   . NVD (normal vaginal delivery)   . Poor appetite   . Liver disease   . Migraines   . Wears glasses   . Arthritis   . Thyroid disease     thyroid goiter  . Bipolar 1 disorder   . Insomnia      Current Outpatient Prescriptions  Medication Sig Dispense Refill  . benztropine (COGENTIN) 0.5 MG tablet Take 0.5 mg by mouth daily.        . citalopram (CELEXA) 40 MG tablet Take 40 mg by mouth daily.        . haloperidol (HALDOL) 1 MG tablet Take 1 mg by mouth 2 (two) times daily.        . hydrOXYzine (VISTARIL) 50 MG capsule Take 50 mg by mouth Nightly.        . lamoTRIgine (LAMICTAL) 150 MG tablet Take 150 mg by mouth daily. Patient taking 1.5 daily        . zolpidem (AMBIEN) 10 MG tablet Take 10 mg by mouth at bedtime as needed.           Allergies  Allergen Reactions  . Demerol Anaphylaxis  . Penicillins Anaphylaxis, Hives and Itching  . Percocet (Oxycodone-Acetaminophen) Hives,  Shortness Of Breath and Itching  . Percodan (Oxycodone-Aspirin) Hives, Shortness Of Breath and Itching  . Aspirin Nausea And Vomiting  . Morphine And Related Other (See Comments)    Unknown by patient has been a while since reaction has occurred.     Family History  Problem Relation Age of Onset  . Other Mother     pacemaker  . Cancer Father     bladder  . Diabetes Father   . Hypertension Father      History   Social History  . Marital Status: Legally Separated    Spouse Name: N/A    Number of Children: N/A  . Years of Education: N/A   Social History Main Topics  . Smoking status: Current Everyday Smoker -- 0.5 packs/day  . Smokeless tobacco: None  . Alcohol Use: Yes     occasional  . Drug Use: No  . Sexually Active: None   Other Topics Concern  . None   Social History Narrative  . None    REVIEW OF SYSTEMS - PERTINENT POSITIVES ONLY: Moderate dysphagia at most meals. Nocturnal choking sensation and shortness of breath. Chronic cough.  EXAM: Filed Vitals:   04/20/11 1015  BP: 124/76  Pulse: 72  Temp: 98.2 F (36.8 C)  Resp: 18    HEENT: normocephalic; pupils equal and reactive; sclerae clear; dentition good; mucous membranes moist NECK:  Enlarged thyroid with multiple nodules; dominant mass left lobe approx 6 cm in size; asymmetric on extension; no palpable anterior or posterior cervical lymphadenopathy; no supraclavicular masses; no tenderness CHEST: clear to auscultation bilaterally without rales, rhonchi, or wheezes CARDIAC: regular rate and rhythm without significant murmur; peripheral pulses are full ABDOMEN: soft without distension; bowel sounds present; no mass; no hepatosplenomegaly; no hernia EXT:  non-tender without edema; no deformity NEURO: no gross focal deficits; no sign of tremor   LABORATORY RESULTS: See [facility] (CHL-Epic) for most recent results   RADIOLOGY RESULTS: See [facility] (CHL-Epic) for most recent  results   IMPRESSION: Multinodular thyroid goiter, interval enlargement of thyroid nodules, progression of compressive symptoms.  PLAN: The patient and I reviewed the above studies and issues. I have recommended proceeding with total thyroidectomy at this time. We again was reviewed the procedure of total thyroidectomy. We discussed the risk and benefits including potential for injury to laryngeal nerves and the parathyroid glands. We discussed the location of the surgical incision. We discussed the hospital stay to be anticipated. We discussed the need for lifelong thyroid hormone replacement therapy. Patient understands and wishes to proceed.  We will make arrangements for surgery in the near future.  The risks and benefits of the procedure have been discussed at length with the patient.  The patient understands the proposed procedure, potential alternative treatments, and the course of recovery to be expected.  All of the patient's questions have been answered at this time.  The patient wishes to proceed with surgery and will schedule a date for their procedure through our office staff.   Velora Heckler, MD, FACS General & Endocrine Surgery [facility]   Visit Diagnoses: 1. Thyroid goiter, multinodular     Primary Care Physician: Norberto Sorenson, MD, MD

## 2011-05-17 NOTE — Plan of Care (Signed)
Problem: Phase I Progression Outcomes Goal: OOB as tolerated unless otherwise ordered Outcome: Progressing Patient has been up walking in room, husband at bedside, no c/o pain voiced

## 2011-05-17 NOTE — Brief Op Note (Signed)
05/17/2011  9:36 AM  PATIENT:  Eulis Foster  56 y.o. female  PRE-OPERATIVE DIAGNOSIS:  multinodular goiter   POST-OPERATIVE DIAGNOSIS:  multinodular goiter  PROCEDURE:  Procedure(s) (LRB): THYROIDECTOMY (N/A)  SURGEON:  Surgeon(s) and Role:    * Velora Heckler, MD - Primary  ASSISTANTS: none   ANESTHESIA:   general  EBL:     BLOOD ADMINISTERED:none  DRAINS: none   LOCAL MEDICATIONS USED:  NONE  SPECIMEN:  Excision  DISPOSITION OF SPECIMEN:  PATHOLOGY  COUNTS:  YES  DICTATION: .Other Dictation: Dictation Number [id number]  PLAN OF CARE: Admit for overnight observation  PATIENT DISPOSITION:  PACU - hemodynamically stable.   Delay start of Pharmacological VTE agent (>24hrs) due to surgical blood loss or risk of bleeding: yes  Velora Heckler, MD, FACS General & Endocrine Surgery [facility]

## 2011-05-17 NOTE — Progress Notes (Signed)
Patient has refused all medicine due at this time. Requesting something to eat; will change diet to full liquids, tolerating clears without any difficulty, no n/v.

## 2011-05-17 NOTE — Transfer of Care (Signed)
Immediate Anesthesia Transfer of Care Note  Patient: [name]  Procedure(s) Performed: Procedure(s) (LRB): THYROIDECTOMY (N/A)  Patient Location: PACU  Anesthesia Type: General  Level of Consciousness: awake, alert , oriented and patient cooperative  Airway & Oxygen Therapy: Patient Spontanous Breathing and Patient connected to face mask oxygen  Post-op Assessment: Report given to PACU RN and Post -op Vital signs reviewed and stable  Post vital signs: Reviewed and stable  Complications: No apparent anesthesia complications

## 2011-05-18 LAB — BASIC METABOLIC PANEL
BUN: 12 mg/dL (ref 6–23)
CO2: 24 mEq/L (ref 19–32)
Calcium: 7.4 mg/dL — ABNORMAL LOW (ref 8.4–10.5)
Calcium: 7.9 mg/dL — ABNORMAL LOW (ref 8.4–10.5)
Creatinine, Ser: 0.71 mg/dL (ref 0.50–1.10)
GFR calc Af Amer: >90 mL/min (ref >90)
GFR calc non Af Amer: >90 mL/min (ref >90)
GFR calc non Af Amer: 81 mL/min — ABNORMAL LOW (ref >90)
Glucose, Bld: 126 mg/dL — ABNORMAL HIGH (ref 70–99)
Glucose, Bld: 108 mg/dL — ABNORMAL HIGH (ref 70–99)
Sodium: 138 mEq/L (ref 135–145)

## 2011-05-18 MED ORDER — SODIUM CHLORIDE 0.9 % IV SOLN
1.0000 g | Freq: Once | INTRAVENOUS | Status: AC
  Administered 2011-05-18: 1 g via INTRAVENOUS
  Filled 2011-05-18 (×2): qty 10

## 2011-05-18 MED ORDER — CALCIUM CARBONATE 1250 (500 CA) MG PO TABS
1000.0000 mg | ORAL_TABLET | Freq: Four times a day (QID) | ORAL | Status: DC
  Administered 2011-05-18 – 2011-05-20 (×9): 1000 mg via ORAL
  Filled 2011-05-18 (×12): qty 2

## 2011-05-18 MED ORDER — CALCITRIOL 0.25 MCG PO CAPS
0.2500 ug | ORAL_CAPSULE | Freq: Every day | ORAL | Status: DC
  Administered 2011-05-19: 0.25 ug via ORAL
  Filled 2011-05-18 (×3): qty 1

## 2011-05-18 MED ORDER — SODIUM CHLORIDE 0.9 % IV SOLN
1.0000 g | INTRAVENOUS | Status: AC
  Administered 2011-05-18: 1 g via INTRAVENOUS
  Filled 2011-05-18: qty 10

## 2011-05-18 NOTE — Progress Notes (Deleted)
Pt discharged home, discharge instructions reviewed with patient and spouse, IV removed per protocol, pt to follow up with MD 05-19-11 15:13pm

## 2011-05-18 NOTE — Progress Notes (Signed)
Patient ID: [name], female   DOB: 1955-06-13, 56 y.o.   MRN: [id number] 1 Day Post-Op  Subjective: Pt feels ok this morning.  The nurse reports that the patient refused to take her calcium supplements yesterday.  Objective: Vital signs in last 24 hours: Temp:  [96.6 F (35.9 C)-98.8 F (37.1 C)] 98.2 F (36.8 C) (02/22 0625) Pulse Rate:  [52-71] 60  (02/22 0625) Resp:  [14-21] 18  (02/22 0625) BP: (100-146)/(50-74) 100/57 mmHg (02/22 0625) SpO2:  [93 %-100 %] 94 % (02/22 0625) FiO2 (%):  [2 %] 2 % (02/21 1218) Weight:  [146 lb (66.225 kg)] 146 lb (66.225 kg) (02/21 1118)    Intake/Output from previous day: 02/21 0701 - 02/22 0700 In: 3080.4 [P.O.:1200; I.V.:1880.4] Out: 1300 [Urine:1300] Intake/Output this shift:    PE: Neck: incision c/d/i with steri-strips present.  No hematoma present.  Tender appropriately. No edema.  Trachea midline.  Lab Results:  No results found for this basename: WBC:2,HGB:2,HCT:2,PLT:2 in the last 72 hours BMET  [facility] 05/18/11 0355  NA 135  K 4.1  CL 102  CO2 24  GLUCOSE 126*  BUN 11  CREATININE 0.71  CALCIUM 7.4*   PT/INR No results found for this basename: LABPROT:2,INR:2 in the last 72 hours   Studies/Results: No results found.  Anti-infectives: Anti-infectives     Start     Dose/Rate Route Frequency Ordered Stop   05/17/11 0530   ciprofloxacin (CIPRO) IVPB 400 mg        400 mg 200 mL/hr over 60 Minutes Intravenous 120 min pre-op 05/17/11 0527 05/17/11 0750           Assessment/Plan  1. S/p total thyroidectomy 2. Hypocalcemia  Plan: 1. D/w Dr. Gerrit [name].  He is replacing her calcium today through her IV and giving her Vit D.   2. Recheck her Ca level this afternoon 3. Anticipate discharge tomorrow if Ca+ above 8.0 per Dr. Gerrit [name].   LOS: 1 day    [name] E 05/18/2011

## 2011-05-18 NOTE — Op Note (Signed)
[name], KILMARTIN NO.:  [id number]  MEDICAL RECORD NO.:  [id number]  LOCATION:  1530                         FACILITY:  [facility]  PHYSICIAN:  Velora Heckler, MD      DATE OF BIRTH:  25-Dec-1955  DATE OF PROCEDURE: 17 May 2011                               OPERATIVE REPORT   PREOPERATIVE DIAGNOSIS:  Multinodular thyroid goiter with compressive symptoms.  POSTOPERATIVE DIAGNOSIS:  Multinodular thyroid goiter with compressive symptoms.  PROCEDURE:  Total thyroidectomy.  SURGEON:  Velora Heckler, MD, FACS  ANESTHESIA:  General.  ANESTHESIOLOGIST:  Dr. Ronelle Nigh  ESTIMATED BLOOD LOSS:  Minimal.  PREPARATION:  ChloraPrep.  COMPLICATIONS:  None.  INDICATIONS:  The patient is a 56 year old, white female followed for bilateral thyroid nodules.  She has a multinodular goiter with compressive symptoms.  On sequential ultrasound scanning, she has had an interval increase in the size of the dominant nodules bilaterally.  She now comes to surgery for thyroidectomy.  BODY OF REPORT:  Procedure was done in OR #6 at the [facility].  The patient was brought to the operating room, placed in a supine position on the operating room table.  Following administration of general anesthesia, the patient was positioned and then prepped and draped in the usual strict aseptic fashion.  After ascertaining that an adequate level of anesthesia had been achieved, a Kocher incision was made with #15 blade.  Dissection was carried through subcutaneous tissues and platysma.  Hemostasis was obtained with the electrocautery.  Skin flaps were elevated cephalad and caudad from the thyroid notch to the sternal notch.  A Mahorner self-retaining retractor was placed for exposure.  Strap muscles were incised in the midline, dissection was begun on the left side.  Left lobe was markedly enlarged with a dominant nodule occupying the upper portion of the lobe  measuring at least 4 cm in maximum diameter.  Middle thyroid vein was divided between Ligaclips with the Harmonic Scalpel.  Gland was gently mobilized.  Inferior venous tributaries were divided between Ligaclips with the Harmonic Scalpel.  Superior pole vessels were dissected out individually and divided between Ligaclips with the Harmonic Scalpel. Parathyroid tissue was identified and preserved.  Gland was rolled anteriorly.  Branches of the inferior thyroid artery were divided between Ligaclips with the Harmonic Scalpel.  Recurrent laryngeal nerve was identified and preserved along its course.  Ligament of Allyson Sabal was released with the electrocautery and the gland was mobilized onto the anterior trachea.  Isthmus was mobilized across the midline.  There was no significant pyramidal lobe.  Dry pack was placed in the left neck.  Next, we turned our attention to the right thyroid lobe.  There were multiple nodules.  Middle vein was divided between Ligaclips with the Harmonic Scalpel.  Superior pole vessels were dissected out and divided individually between Ligaclips with the Harmonic Scalpel.  Parathyroid tissue was identified and preserved.  Inferior venous tributaries were divided between Ligaclips with the Harmonic Scalpel.  Branches of the inferior thyroid artery were divided between small Ligaclips with the Harmonic Scalpel.  Recurrent laryngeal nerve was identified and preserved along its course at the edge  of the esophagus.  Ligament of Allyson Sabal was released with the electrocautery and the gland was mobilized onto the anterior trachea, from which it was completely excised with the electrocautery used for hemostasis.  Sutures used to mark the right superior pole.  The entire thyroid gland was submitted to Pathology for review.  Neck was irrigated with warm saline.  Surgicel was placed in the operative field bilaterally.  Strap muscles were reapproximated in the midline with  interrupted 3-0 Vicryl sutures.  Platysma was closed with interrupted 3-0 Vicryl sutures.  Skin was closed with a running 4-0 Monocryl subcuticular suture.  Wound was washed and dried and benzoin and Steri-Strips were applied.  Sterile dressings were applied.  The patient was awakened from anesthesia and brought to the recovery room. The patient tolerated the procedure well.   Velora Heckler, MD, FACS   TMG/MEDQ  D:  05/17/2011  T:  05/18/2011  Job:  [id number]  cc:   Norberto Sorenson, MD Fax: [phone]

## 2011-05-19 LAB — BASIC METABOLIC PANEL
BUN: 10 mg/dL (ref 6–23)
Creatinine, Ser: 0.8 mg/dL (ref 0.50–1.10)
GFR calc Af Amer: >90 mL/min (ref >90)
GFR calc non Af Amer: 81 mL/min — ABNORMAL LOW (ref >90)

## 2011-05-19 MED ORDER — CALCIUM GLUCONATE 10 % IV SOLN
1.0000 g | Freq: Three times a day (TID) | INTRAVENOUS | Status: DC
  Administered 2011-05-19 (×3): 1 g via INTRAVENOUS
  Filled 2011-05-19 (×6): qty 10

## 2011-05-19 NOTE — Progress Notes (Signed)
Patient ID: [name], female   DOB: [birth year], 56 y.o.   MRN: [id number] 2 Days Post-Op  Subjective: No complaints today. Denies cramps or paresthesias.  Objective: Vital signs in last 24 hours: Temp:  [97.9 F (36.6 C)-98 F (36.7 C)] 97.9 F (36.6 C) (02/23 0543) Pulse Rate:  [61-63] 62  (02/23 0543) Resp:  [18] 18  (02/23 0543) BP: (95-110)/(55-61) 106/61 mmHg (02/23 0543) SpO2:  [92 %-95 %] 95 % (02/23 0543)    Intake/Output from previous day: 02/22 0701 - 02/23 0700 In: 1683.3 [P.O.:480; I.V.:1203.3] Out: 1700 [Urine:1700] Intake/Output this shift:    General appearance: alert and no distress Incision/Wound:clean and dry without hematoma or evidence of infection  Lab Results:  No results found for this basename: WBC:2,HGB:2,HCT:2,PLT:2 in the last 72 hours BMET  Basename 05/19/11 0500 05/18/11 1550  NA 138 138  K 3.8 4.3  CL 104 104  CO2 26 25  GLUCOSE 100* 108*  BUN 10 12  CREATININE 0.80 0.80  CALCIUM 7.1* 7.9*     Studies/Results: No results found.  Anti-infectives: Anti-infectives     Start     Dose/Rate Route Frequency Ordered Stop   05/17/11 0530   ciprofloxacin (CIPRO) IVPB 400 mg        400 mg 200 mL/hr over 60 Minutes Intravenous 120 min pre-op 05/17/11 0527 05/17/11 0750          Assessment/Plan: s/p Procedure(s): THYROIDECTOMY Persistent postop hypocalcemia. We'll continue oral calcium and Rocaltrol and repeat IV calcium today.   LOS: 2 days    Luceil Herrin T 05/19/2011

## 2011-05-20 MED ORDER — TRAMADOL HCL 50 MG PO TABS: 50.0000 mg | ORAL_TABLET | Freq: Four times a day (QID) | ORAL | Status: AC | PRN

## 2011-05-20 MED ORDER — CALCIUM CARBONATE 1250 (500 CA) MG PO TABS: 2.0000 | ORAL_TABLET | Freq: Four times a day (QID) | ORAL | Status: DC

## 2011-05-20 NOTE — Discharge Instructions (Signed)
[facility]      [facility] °[phone] ° °THYROID/ PARATHYROID SURGERY: POST OP INSTRUCTIONS ° °Always review your discharge instruction sheet given to you by the facility where your surgery was performed. ° °IF YOU HAVE DISABILITY OR FAMILY LEAVE FORMS, YOU MUST BRING THEM TO THE OFFICE FOR PROCESSING.  PLEASE DO NOT GIVE THEM TO YOUR DOCTOR. ° °1. A prescription for pain medication may be given to you upon discharge.  Take your pain medication as prescribed, if needed.  If narcotic pain medicine is not needed, then you may take acetaminophen (Tylenol) or ibuprofen (Advil) as needed. °2. Take your usually prescribed medications unless otherwise directed. °3. If you need a refill on your pain medication, please contact your pharmacy. They will contact our office to request authorization.  Prescriptions will not be filled after 5pm or on week-ends. °4. You should follow a light diet the first 24 hours after arrival home, such as soup and crackers, etc.  Be sure to include lots of fluids daily.  Resume your normal diet the day after surgery. °5. Most patients will experience some swelling and bruising on the chest and neck area.  Ice packs will help.  Swelling and bruising can take several days to resolve.  °6. It is common to experience some constipation if taking pain medication after surgery.  Increasing fluid intake and taking a stool softener will usually help or prevent this problem from occurring.  A mild laxative (Milk of Magnesia or Miralax) should be taken according to package directions if there are no bowel movements after 48 hours. °7. Unless discharge instructions indicate otherwise, you may remove your bandages 24-48 hours after surgery, and you may shower at that time.  You may have steri-strips (small skin tapes) in place directly over the incision.  These strips should be left on the skin for 7-10 days.  If your surgeon used skin glue on the incision, you may shower in 24 hours.  The  glue will flake off over the next 2-3 weeks.  Any sutures or staples will be removed at the office during your follow-up visit. °8. ACTIVITIES:  You may resume regular (light) daily activities beginning the next day--such as daily self-care, walking, climbing stairs--gradually increasing activities as tolerated.  You may have sexual intercourse when it is comfortable.  Refrain from any heavy lifting or straining until approved by your doctor. °a. You may drive when you no longer are taking prescription pain medication, you can comfortably wear a seatbelt, and you can safely maneuver your car and apply brakes °b. RETURN TO WORK:  __________________________________________________________ °9. You should see your doctor in the office for a follow-up appointment approximately two weeks after your surgery.  Make sure that you call for this appointment within a day or two after you arrive home to insure a convenient appointment time. °10. OTHER INSTRUCTIONS: ____________________________________________________________________________ _________________________________________________________________________________________________________________ °_________________________________________________________________________________________________________________ ° ° °WHEN TO CALL YOUR DOCTOR: °1. Fever over 101.0 °2. Inability to urinate °3. Nausea and/or vomiting °4. Extreme swelling or bruising °5. Continued bleeding from incision. °6. Increased pain, redness, or drainage from the incision. °7. Difficulty swallowing or breathing °8. Muscle cramping or spasms. °9. Numbness or tingling in hands or feet or around lips. ° °The clinic staff is available to answer your questions during regular business hours.  Please don’t hesitate to call and ask to speak to one of the nurses if you have concerns. ° °For further questions, please visit [email or web address] °

## 2011-05-20 NOTE — Progress Notes (Signed)
Patient ID: [name], female   DOB: March 24, 1956, 56 y.o.   MRN: [id number] 3 Days Post-Op  Subjective: No complaints. Was to go home.  Objective: Vital signs in last 24 hours: Temp:  [97.7 F (36.5 C)-98.6 F (37 C)] 97.7 F (36.5 C) (02/24 0547) Pulse Rate:  [62-71] 67  (02/24 0547) Resp:  [18] 18  (02/24 0547) BP: (104-114)/(52-72) 114/72 mmHg (02/24 0547) SpO2:  [95 %-97 %] 97 % (02/24 0547) Last BM Date: 05/19/11  Intake/Output from previous day: 02/23 0701 - 02/24 0700 In: 1145 [P.O.:480; I.V.:665] Out: 500 [Urine:500] Intake/Output this shift:    Incision/Wound:clean and dry without complication  Lab Results:  No results found for this basename: WBC:2,HGB:2,HCT:2,PLT:2 in the last 72 hours BMET  Basename 05/20/11 0417 05/19/11 0500 05/18/11 1550  NA -- 138 138  K -- 3.8 4.3  CL -- 104 104  CO2 -- 26 25  GLUCOSE -- 100* 108*  BUN -- 10 12  CREATININE -- 0.80 0.80  CALCIUM 8.6 7.1* --     Studies/Results: No results found.  Anti-infectives: Anti-infectives     Start     Dose/Rate Route Frequency Ordered Stop   05/17/11 0530   ciprofloxacin (CIPRO) IVPB 400 mg        400 mg 200 mL/hr over 60 Minutes Intravenous 120 min pre-op 05/17/11 0527 05/17/11 0750          Assessment/Plan: s/p Procedure(s): THYROIDECTOMY Doing well. Postop hypocalcemia resolved. Discharge home.   LOS: 3 days    Paulette Lynch T 05/20/2011

## 2011-05-21 ENCOUNTER — Other Ambulatory Visit (INDEPENDENT_AMBULATORY_CARE_PROVIDER_SITE_OTHER): Payer: Self-pay | Admitting: Surgery

## 2011-05-21 ENCOUNTER — Telehealth (INDEPENDENT_AMBULATORY_CARE_PROVIDER_SITE_OTHER): Payer: Self-pay

## 2011-05-21 DIAGNOSIS — Z9889 Other specified postprocedural states: Secondary | ICD-10-CM

## 2011-05-21 DIAGNOSIS — E890 Postprocedural hypothyroidism: Secondary | ICD-10-CM

## 2011-05-21 NOTE — Telephone Encounter (Addendum)
8:45am - Call to patient for update on condition as requested- no answer- left message for a call back. 10:15am - Call returned- Patient stated she is doing well- Glad to be at home- She was advised to call back if condition should change or if she has any questions before her follow up visit - Serum calcium level will be checked on Wednesday 05/23/2011 - Lab slip faxed today.

## 2011-05-23 ENCOUNTER — Telehealth (INDEPENDENT_AMBULATORY_CARE_PROVIDER_SITE_OTHER): Payer: Self-pay

## 2011-05-23 ENCOUNTER — Other Ambulatory Visit (INDEPENDENT_AMBULATORY_CARE_PROVIDER_SITE_OTHER): Payer: Self-pay | Admitting: Surgery

## 2011-05-23 NOTE — Telephone Encounter (Signed)
done

## 2011-05-24 ENCOUNTER — Other Ambulatory Visit (INDEPENDENT_AMBULATORY_CARE_PROVIDER_SITE_OTHER): Payer: Self-pay | Admitting: Surgery

## 2011-05-24 DIAGNOSIS — E890 Postprocedural hypothyroidism: Secondary | ICD-10-CM

## 2011-05-24 DIAGNOSIS — E042 Nontoxic multinodular goiter: Secondary | ICD-10-CM

## 2011-05-24 DIAGNOSIS — Z9889 Other specified postprocedural states: Secondary | ICD-10-CM

## 2011-05-24 NOTE — Progress Notes (Signed)
Addended by: Charise Carwin on: 05/24/2011 10:05 AM   Modules accepted: Orders

## 2011-05-24 NOTE — Progress Notes (Signed)
Addended by: Charise Carwin on: 05/24/2011 10:16 AM   Modules accepted: Orders

## 2011-05-28 ENCOUNTER — Encounter (HOSPITAL_COMMUNITY): Payer: Self-pay | Admitting: Surgery

## 2011-05-30 ENCOUNTER — Encounter (INDEPENDENT_AMBULATORY_CARE_PROVIDER_SITE_OTHER): Payer: Self-pay | Admitting: Surgery

## 2011-05-30 ENCOUNTER — Ambulatory Visit (INDEPENDENT_AMBULATORY_CARE_PROVIDER_SITE_OTHER): Payer: PRIVATE HEALTH INSURANCE | Admitting: Surgery

## 2011-05-30 VITALS — BP 108/78 | HR 78 | Temp 97.0°F | Resp 16 | Ht 64.0 in | Wt 153.4 lb

## 2011-05-30 DIAGNOSIS — E049 Nontoxic goiter, unspecified: Secondary | ICD-10-CM

## 2011-05-30 MED ORDER — SYNTHROID 88 MCG PO TABS: 88.0000 ug | ORAL_TABLET | Freq: Every day | ORAL | Status: DC

## 2011-05-30 NOTE — Patient Instructions (Signed)
  COCOA BUTTER & VITAMIN E CREAM  ([employer] or other brand)  Apply cocoa butter/vitamin E cream to your incision 2 - 3 times daily.  Massage cream into incision for one minute with each application.  Use sunscreen (50 SPF or higher) for first 6 months after surgery if area is exposed to sun.  You may substitute Mederma or other scar reducing creams as desired.   

## 2011-05-30 NOTE — Progress Notes (Signed)
Visit Diagnoses: 1. Thyroid goiter, multinodular     HISTORY: Patient returns for her first postoperative visit having undergone total thyroidectomy for multinodular thyroid goiter. Postoperative course has been uncomplicated. Followup serum calcium level is low normal at 8.3. Patient continues to take calcium supplements several times daily. She has had no symptoms. Patient is not currently taking thyroid hormone replacement.  EXAM: Surgical wound is healing nicely. Steri-Strips are removed. Mild soft tissue swelling. No sign of infection. Voice quality is normal.  IMPRESSION: Status post total thyroidectomy for multinodular thyroid goiter  PLAN: Patient is given a prescription for Synthroid 88 mcg daily. She will start that now. She will wean her calcium tablets. We will check a TSH level in 6 weeks. I will see her back at that time for a wound check. Patient will also start applying topical creams her incision.  Velora Heckler, MD, FACS General & Endocrine Surgery [facility]

## 2011-06-26 ENCOUNTER — Telehealth (INDEPENDENT_AMBULATORY_CARE_PROVIDER_SITE_OTHER): Payer: Self-pay

## 2011-06-26 NOTE — Telephone Encounter (Addendum)
C/o of weight gain. Patient stated she has gained 15 lbs since surgery. She thinks its because she is taking Synthroid. Incision still tender. Patient advised to monitor diet intake. Surgery is somewhat fresh and it will take time to heal. She is a daily smoker.

## 2011-07-16 ENCOUNTER — Encounter (INDEPENDENT_AMBULATORY_CARE_PROVIDER_SITE_OTHER): Payer: Self-pay | Admitting: Surgery

## 2011-07-16 ENCOUNTER — Ambulatory Visit (INDEPENDENT_AMBULATORY_CARE_PROVIDER_SITE_OTHER): Payer: PRIVATE HEALTH INSURANCE | Admitting: Surgery

## 2011-07-16 VITALS — BP 126/80 | HR 74 | Temp 97.4°F | Resp 18 | Ht 64.0 in | Wt 153.2 lb

## 2011-07-16 DIAGNOSIS — E049 Nontoxic goiter, unspecified: Secondary | ICD-10-CM

## 2011-07-16 DIAGNOSIS — E890 Postprocedural hypothyroidism: Secondary | ICD-10-CM

## 2011-07-16 NOTE — Progress Notes (Signed)
Visit Diagnoses: 1. Thyroid goiter, multinodular   2. Hypothyroidism, postsurgical     HISTORY: Patient returns today for followup. At her last visit she was started on thyroid hormone replacement with Synthroid 88 mcg daily. Followup TSH level was checked last week and is normal at 3.740.  EXAM: Surgical wound is well-healed. Mild soft tissue swelling. No sign of seroma. Voice quality is normal. No palpable masses.  IMPRESSION: Status post total thyroidectomy, now on thyroid hormone replacement therapy  PLAN: Patient will remain on Synthroid 88 mcg daily. I will ask her primary physician to manage this and to check her TSH level periodically in the future.  Patient will return in 6 months for final wound check.  Velora Heckler, MD, FACS General & Endocrine Surgery [facility]

## 2011-07-16 NOTE — Patient Instructions (Signed)
  COCOA BUTTER & VITAMIN E CREAM  ([employer] or other brand)  Apply cocoa butter/vitamin E cream to your incision 2 - 3 times daily.  Massage cream into incision for one minute with each application.  Use sunscreen (50 SPF or higher) for first 6 months after surgery if area is exposed to sun.  You may substitute Mederma or other scar reducing creams as desired.   

## 2011-07-20 ENCOUNTER — Encounter (INDEPENDENT_AMBULATORY_CARE_PROVIDER_SITE_OTHER): Payer: Self-pay

## 2011-09-03 ENCOUNTER — Telehealth (INDEPENDENT_AMBULATORY_CARE_PROVIDER_SITE_OTHER): Payer: Self-pay

## 2011-09-03 NOTE — Telephone Encounter (Signed)
I spoke with pt via phone. The nodules pt feels are not at thyroid area. Pt is advised to see her PCP to have this evaluated. Pt advised she should not have bloody sputum from surgery back in Feb. 2013. Pt states she agrees and will call her pcp now.

## 2011-09-03 NOTE — Telephone Encounter (Signed)
[name] is s/p total thyroidectomy.  She called today c/o of a cough which started one week ago.  She also says she feels some lumps on her neck.  While eating an ice cream cone this weekend she began coughing.  She says there was a light spatter of blood on the cone.  She noticed the cough and the lump(s) at the same time. She has no fever or pain.  I told her we would contact her with a f/u appointment.

## 2011-09-04 ENCOUNTER — Emergency Department (INDEPENDENT_AMBULATORY_CARE_PROVIDER_SITE_OTHER): Payer: Medicaid Other

## 2011-09-04 ENCOUNTER — Encounter (HOSPITAL_COMMUNITY): Payer: Self-pay | Admitting: *Deleted

## 2011-09-04 ENCOUNTER — Emergency Department (INDEPENDENT_AMBULATORY_CARE_PROVIDER_SITE_OTHER)
Admission: EM | Admit: 2011-09-04 | Discharge: 2011-09-04 | Disposition: A | Discharge status: Home / Self Care | Payer: Self-pay | Source: Home / Self Care | Attending: Emergency Medicine | Admitting: Emergency Medicine

## 2011-09-04 DIAGNOSIS — J209 Acute bronchitis, unspecified: Secondary | ICD-10-CM

## 2011-09-04 MED ORDER — ALBUTEROL SULFATE HFA 108 (90 BASE) MCG/ACT IN AERS: 1.0000 | INHALATION_SPRAY | Freq: Four times a day (QID) | RESPIRATORY_TRACT | Status: DC | PRN

## 2011-09-04 MED ORDER — BENZONATATE 200 MG PO CAPS: 200.0000 mg | ORAL_CAPSULE | Freq: Three times a day (TID) | ORAL | Status: AC | PRN

## 2011-09-04 MED ORDER — DOXYCYCLINE HYCLATE 100 MG PO TABS: 100.0000 mg | ORAL_TABLET | Freq: Two times a day (BID) | ORAL | Status: AC

## 2011-09-04 NOTE — Discharge Instructions (Signed)

## 2011-09-04 NOTE — ED Provider Notes (Signed)
Chief Complaint  Patient presents with  . Cough    History of Present Illness:   [name] is a 56 year old female with hepatitis B and C. and cirrhosis. She's had a one-week history of cough productive green sputum. She denies any blood in her sputum. She's had occasional wheezing. She denies any history of asthma, but she does smoke a half pack of cigarettes a day. She's had no fevers, chills, sweats, nasal congestion, rhinorrhea, or sore throat. She's had a slight headache. She's felt a lump in the right side of her neck which seems somewhat tender to touch. She denies any other lymphadenopathy. The patient also states that a week ago she was in the ice cream cone she noted some specks of blood on aspirin. She was not coughing at the time and doesn't think she coughed up any blood. She thinks this came from somewhere inside of her mouth, but she's not sure where it's from. She has dentures. She's not had any oral ulcerations. She denies any sore throat, hoarseness, or pain on swallowing.  Review of Systems:  Other than noted above, the patient denies any of the following symptoms: Systemic:  No fevers, chills, sweats, weight loss or gain, fatigue, or tiredness. ENT:  No nasal congestion, sneezing, itching, postnasal drip, sinus pressure, headache, sore throat, or hoarseness. Lungs:  No wheezing, shortness of breath, chest tightness or congestion. Heart:  No chest pain, tightness, pressure, PND, orthopnea, or ankle edema. GI:  No indigestion, heartburn, waterbrash, burping, abdominal pain, nausea, or vomiting.  PMFSH:  Past medical history, family history, social history, meds, and allergies were reviewed.  Physical Exam:   Vital signs:  BP 116/64  Pulse 72  Temp(Src) 98.2 F (36.8 C) (Oral)  Resp 16  SpO2 94% General:  Alert and oriented.  In no distress.  Skin warm and dry. ENT: TMs and ear canals normal.  Nasal mucosa normal, without drainage.  Pharynx clear without exudate or drainage.  No  intraoral lesions. She has upper and lower dentures. No lesions were seen on the lips, tongue, floor the mouth, the alveolar ridges, or the pharynx. Her frenulum was a little bit irritated, it looks like from her dentures, and this may have been the source of the bleeding. There was no active bleeding today. Neck:  No adenopathy, tenderness or mass.  No JVD. She had no lymphadenopathy. The bump that she is referring to in the right side of her neck appears to be a prominent carotid bulb. There is no lymphadenopathy, mass, or tenderness to palpation. Lungs:  No respiratory distress.  Breath sounds clear and equal bilaterally.  No wheezes, rales or rhonchi. Heart:  Regular rhythm, no gallops or murmers.  No pedal edema. Abdomon:  Soft and nontender.  No organomegaly or mass.  Radiology:  Dg Chest 2 View  09/04/2011  *RADIOLOGY REPORT*  Clinical Data: 1-week history of productive coughing.  Smoker.  CHEST - 2 VIEW  Comparison: 05/14/2011.  Findings: Cardiac silhouette is in the upper range of normal size. Mediastinal and hilar contours appear stable. No pulmonary infiltrates or nodules were evident.  No pleural disease is evident.  There is slightly osteopenic appearance of the bones. Minimal degenerative spondylosis changes are present.  Minimal central peribronchial thickening is seen.  IMPRESSION: Minimal central peribronchial thickening.  This may be associated with bronchitis, asthma, and reactive airway disease.  No consolidation or pleural effusion is evident.  Original Report Authenticated By: Crawford Givens, M.D.    Medical Decision Making:  I think she just has acute bronchitis. The knot on her neck just appears to be a prominent carotid bulb. I did not feel any lymphadenopathy. I'm not sure the cause for the blood that she was able to spread out her mouth. This may or discomfort and irritated frenulum. I did suggest that she followup with an ENT Dr. for the knot on her neck and also the oral  bleeding.  Assessment:  The encounter diagnosis was Acute bronchitis.  Plan:   1.  The following meds were prescribed:   New Prescriptions   ALBUTEROL (PROVENTIL HFA;VENTOLIN HFA) 108 (90 BASE) MCG/ACT INHALER    Inhale 1-2 puffs into the lungs every 6 (six) hours as needed for wheezing.   BENZONATATE (TESSALON) 200 MG CAPSULE    Take 1 capsule (200 mg total) by mouth 3 (three) times daily as needed for cough.   DOXYCYCLINE (VIBRA-TABS) 100 MG TABLET    Take 1 tablet (100 mg total) by mouth 2 (two) times daily.   2.  The patient was instructed in symptomatic care and handouts were given. 3.  The patient was told to return if becoming worse in any way, if no better in 3 or 4 days, and given some red flag symptoms that would indicate earlier return.  Follow up:  The patient was told to follow up with a primary care physician and also with Dr. Suzanna Obey for her ENT issues.      [name], MD 09/04/11 Mikle Bosworth

## 2011-09-04 NOTE — ED Notes (Signed)
Pt reports a productive cough, and   Sore throat  X 1 week without a fever.    She had a thyroidectomy about 2 months ago

## 2011-09-17 ENCOUNTER — Other Ambulatory Visit (INDEPENDENT_AMBULATORY_CARE_PROVIDER_SITE_OTHER): Payer: Self-pay | Admitting: Surgery

## 2011-09-19 ENCOUNTER — Telehealth (INDEPENDENT_AMBULATORY_CARE_PROVIDER_SITE_OTHER): Payer: Self-pay

## 2011-09-19 NOTE — Telephone Encounter (Signed)
Pt notified 2 refills of her Synthroid called to [facility].  She was instructed to see Dr. Norberto Sorenson (PCP) for followup management of her TSH and.  Pt has an appt with Dr. Clelia Croft next week.

## 2011-09-26 LAB — CBC
Hemoglobin: 15.9 g/dL (ref 12.0–16.0)
WBC: 5.9
platelet count: 103

## 2011-09-26 LAB — PROTIME-INR: PT: 11.2

## 2011-09-26 LAB — COMPREHENSIVE METABOLIC PANEL
ALT: 113 U/L — AB (ref 7–35)
Albumin: 4.1
Creat: 0.67

## 2011-10-02 ENCOUNTER — Other Ambulatory Visit (HOSPITAL_COMMUNITY): Payer: Self-pay | Admitting: Family Medicine

## 2011-10-02 DIAGNOSIS — Z1231 Encounter for screening mammogram for malignant neoplasm of breast: Secondary | ICD-10-CM

## 2011-10-19 ENCOUNTER — Ambulatory Visit (HOSPITAL_COMMUNITY)
Admission: RE | Admit: 2011-10-19 | Discharge: 2011-10-19 | Disposition: A | Discharge status: Home / Self Care | Payer: Self-pay | Source: Ambulatory Visit | Attending: Family Medicine | Admitting: Family Medicine

## 2011-10-19 DIAGNOSIS — Z1231 Encounter for screening mammogram for malignant neoplasm of breast: Secondary | ICD-10-CM | POA: Insufficient documentation

## 2011-11-30 ENCOUNTER — Telehealth (INDEPENDENT_AMBULATORY_CARE_PROVIDER_SITE_OTHER): Payer: Self-pay

## 2011-11-30 ENCOUNTER — Encounter (INDEPENDENT_AMBULATORY_CARE_PROVIDER_SITE_OTHER): Payer: Self-pay

## 2011-11-30 NOTE — Telephone Encounter (Signed)
Recall letter mailed to pt. Exam only.

## 2012-01-16 ENCOUNTER — Ambulatory Visit (INDEPENDENT_AMBULATORY_CARE_PROVIDER_SITE_OTHER): Payer: Self-pay | Admitting: Surgery

## 2012-01-16 ENCOUNTER — Encounter (INDEPENDENT_AMBULATORY_CARE_PROVIDER_SITE_OTHER): Payer: Self-pay | Admitting: Surgery

## 2012-01-16 VITALS — BP 120/74 | HR 71 | Temp 97.3°F | Ht 64.0 in | Wt 155.6 lb

## 2012-01-16 DIAGNOSIS — E890 Postprocedural hypothyroidism: Secondary | ICD-10-CM

## 2012-01-16 DIAGNOSIS — E049 Nontoxic goiter, unspecified: Secondary | ICD-10-CM

## 2012-01-16 NOTE — Patient Instructions (Signed)
  COCOA BUTTER & VITAMIN E CREAM  ([employer] or other brand)  Apply cocoa butter/vitamin E cream to your incision 2 - 3 times daily.  Massage cream into incision for one minute with each application.  Use sunscreen (50 SPF or higher) for first 6 months after surgery if area is exposed to sun.  You may substitute Mederma or other scar reducing creams as desired.   

## 2012-01-16 NOTE — Progress Notes (Signed)
General Surgery [facility]  Visit Diagnoses: 1. Thyroid goiter, multinodular   2. Hypothyroidism, postsurgical     HISTORY: Patient returns for follow-up having undergone total thyroidectomy in February 2013 for multinodular goiter with compressive symptoms. She is currently taking Synthroid 88 mcg daily. She continues to apply topical creams to her incision.  PERTINENT REVIEW OF SYSTEMS: Denies tremor. Denies palpitations. Patient notes weight gain of approximately 20 pounds. She notes mild thinning of the hair. She notes mild fatigue.  EXAM: HEENT: normocephalic; pupils equal and reactive; sclerae clear; dentition good; mucous membranes moist NECK:  Well-healed surgical incision, no palpable masses in thyroid bed; symmetric on extension; no palpable anterior or posterior cervical lymphadenopathy; no supraclavicular masses; no tenderness CHEST: clear to auscultation bilaterally without rales, rhonchi, or wheezes CARDIAC: regular rate and rhythm without significant murmur; peripheral pulses are full EXT:  non-tender without edema; no deformity NEURO: no gross focal deficits; no sign of tremor   IMPRESSION: Status post total thyroidectomy, possible mild postsurgical hypothyroidism  PLAN: Patient will establish a new relationship with her primary care provider in December. She should have her TSH level checked at that time and possibly her dose of Synthroid adjusted. I will leave this up to her primary care physician. Patient will continue to apply topical creams to her incision.  Patient will return to this practice as needed for follow-up.  Velora Heckler, MD, [facility] Office: [phone]

## 2012-03-13 ENCOUNTER — Ambulatory Visit (INDEPENDENT_AMBULATORY_CARE_PROVIDER_SITE_OTHER): Payer: Medicare Other | Admitting: Family Medicine

## 2012-03-13 ENCOUNTER — Encounter: Payer: Self-pay | Admitting: Family Medicine

## 2012-03-13 VITALS — BP 112/64 | HR 76 | Temp 97.8°F | Ht 64.0 in | Wt 161.0 lb

## 2012-03-13 DIAGNOSIS — F1011 Alcohol abuse, in remission: Secondary | ICD-10-CM

## 2012-03-13 DIAGNOSIS — K7469 Other cirrhosis of liver: Secondary | ICD-10-CM | POA: Insufficient documentation

## 2012-03-13 DIAGNOSIS — F319 Bipolar disorder, unspecified: Secondary | ICD-10-CM | POA: Insufficient documentation

## 2012-03-13 DIAGNOSIS — K7460 Unspecified cirrhosis of liver: Secondary | ICD-10-CM

## 2012-03-13 DIAGNOSIS — B182 Chronic viral hepatitis C: Secondary | ICD-10-CM

## 2012-03-13 DIAGNOSIS — G43909 Migraine, unspecified, not intractable, without status migrainosus: Secondary | ICD-10-CM | POA: Insufficient documentation

## 2012-03-13 DIAGNOSIS — K219 Gastro-esophageal reflux disease without esophagitis: Secondary | ICD-10-CM | POA: Insufficient documentation

## 2012-03-13 DIAGNOSIS — K769 Liver disease, unspecified: Secondary | ICD-10-CM

## 2012-03-13 DIAGNOSIS — E049 Nontoxic goiter, unspecified: Secondary | ICD-10-CM

## 2012-03-13 DIAGNOSIS — E890 Postprocedural hypothyroidism: Secondary | ICD-10-CM

## 2012-03-13 DIAGNOSIS — B070 Plantar wart: Secondary | ICD-10-CM

## 2012-03-13 DIAGNOSIS — F17200 Nicotine dependence, unspecified, uncomplicated: Secondary | ICD-10-CM | POA: Insufficient documentation

## 2012-03-13 DIAGNOSIS — L84 Corns and callosities: Secondary | ICD-10-CM | POA: Insufficient documentation

## 2012-03-13 DIAGNOSIS — B1920 Unspecified viral hepatitis C without hepatic coma: Secondary | ICD-10-CM | POA: Insufficient documentation

## 2012-03-13 LAB — T4, FREE: Free T4: 1 ng/dL (ref 0.60–1.60)

## 2012-03-13 NOTE — Assessment & Plan Note (Signed)
Continue to encourage cessation.  Discussed methods to help quit.  precontemplative

## 2012-03-13 NOTE — Progress Notes (Signed)
Subjective:    Patient ID: [name], female    DOB: 21-Oct-1955, 56 y.o.   MRN: [id number]  HPI CC: new pt to establish  Has seen several doctors in last few years.  Prior saw [facility].  Sees Dr. Gerrit Friends for thyroid.  Currently seeing [facility] Dr. Foy Guadalajara (GI) for h/o Hep C and cirrhosis.  Off liver meds (ie interferon) 2/2 h/o bipolar and interactions.  Has f/u with GI 03/2012.    Bipolar disorder - dx age 6s.  compliant with meds.  Followed by [facility] psych (Dr. Verlon Au).  Doesn't like risperdal.  To f/u with her 03/2012.  H/o alcohol abuse recently (stopped in last several months) and substance abuse (remote). Smoking - 1/2 ppd.  Precontemplative.  Foot pain - h/o calluses, plantar fasciitis.  R big toe occasionally locks up.  Has not seen foot doctor in past.  Hypothyroid - on synthroid daily.  Noticing weight gain as well as hair loss.  Has gained 37lbs in last year. Wt Readings from Last 3 Encounters:  03/13/12 161 lb (73.029 kg)  01/16/12 155 lb 9.6 oz (70.58 kg)  07/16/11 153 lb 4 oz (69.514 kg)   Lives alone.  Divorced.  No children Occupation: disability for bipolar Activity: no regular exercise Diet: some water, fruits/vegetables occasional  Preventative: Mammogram - 09/2011 WNL No recent CPE. Flu shot - declines Tetanus shot - thinks about 61yrs ago.  Declines today.  Medications and allergies reviewed and updated in chart.  Past histories reviewed and updated if relevant as below. Patient Active Problem List  Diagnosis  . Thyroid goiter, multinodular  . Hypothyroidism, postsurgical  . History of alcohol abuse  . Smoker  . Chronic hepatitis C  . GERD (gastroesophageal reflux disease)  . Bipolar 1 disorder  . Migraines  . Cirrhosis   Past Medical History  Diagnosis Date  . Migraines   . Acquired hypothyroidism     thyroid goiter s/p thyroidectomy  . Bipolar 1 disorder 1990s  . Insomnia   . Varicose veins   . GERD (gastroesophageal reflux  disease)   . Depression     states seen weekly at [facility]  . Complication of anesthesia     states 15 yrs ago, hard time waking up, states cant breathe when coming out of anesthesia  . Hepatitis A     states followed at [facility]/last OV 10/12 and EGD report on chart  . Chronic hepatitis C   . Cirrhosis   . Liver disease   . Smoker   . History of alcohol abuse    Past Surgical History  Procedure Date  . Appendectomy   . Liver biopsy   . Diagnostic laparoscopy     15 yrs ago  . Thyroidectomy 05/17/2011    Procedure: THYROIDECTOMY;  Surgeon: Velora Heckler, MD;  Location: [facility];  Service: General;  Laterality: N/A;  Total Thyroidectomy    History  Substance Use Topics  . Smoking status: Current Every Day Smoker -- 0.5 packs/day for 40 years    Types: Cigarettes  . Smokeless tobacco: Former Neurosurgeon     Comment: decreasing use 05/30/2011  . Alcohol Use: No   Family History  Problem Relation Age of Onset  . Heart disease Mother     pacemaker  . Cancer Father     bladder  . Diabetes Father   . Hypertension Father   . Cancer Father     melanoma   Allergies  Allergen Reactions  .  Demerol Anaphylaxis  . Penicillins Anaphylaxis, Hives and Itching  . Percocet (Oxycodone-Acetaminophen) Hives, Shortness Of Breath and Itching  . Percodan (Oxycodone-Aspirin) Hives, Shortness Of Breath and Itching  . Aspirin Nausea And Vomiting  . Morphine And Related Other (See Comments)    Trouble breathing, rash   Current Outpatient Prescriptions on File Prior to Visit  Medication Sig Dispense Refill  . citalopram (CELEXA) 40 MG tablet Take 40 mg by mouth daily.       . risperiDONE (RISPERDAL) 3 MG tablet Take 3 mg by mouth daily.      Marland Kitchen SYNTHROID 88 MCG tablet TAKE 1 TABLET (88 MCG TOTAL) BY MOUTH DAILY.  30 tablet  3     Review of Systems  Constitutional: Positive for unexpected weight change (weight gain). Negative for fever, chills, activity change, appetite change and fatigue.   HENT: Negative for hearing loss and neck pain.   Eyes: Negative for visual disturbance.  Respiratory: Positive for cough and wheezing. Negative for chest tightness and shortness of breath.   Cardiovascular: Negative for chest pain, palpitations and leg swelling.  Gastrointestinal: Negative for nausea, vomiting, abdominal pain, diarrhea, constipation, blood in stool and abdominal distention.  Genitourinary: Negative for hematuria and difficulty urinating.  Musculoskeletal: Negative for myalgias and arthralgias.  Skin: Negative for rash.  Neurological: Negative for dizziness, seizures, syncope and headaches.  Hematological: Does not bruise/bleed easily.  Psychiatric/Behavioral: Positive for dysphoric mood (on rollercoaster, h/o bipolar). The patient is not nervous/anxious.        Objective:   Physical Exam  Nursing note and vitals reviewed. Constitutional: She is oriented to person, place, and time. She appears well-developed and well-nourished. No distress.  HENT:  Head: Normocephalic and atraumatic.  Right Ear: Hearing, tympanic membrane, external ear and ear canal normal.  Left Ear: Hearing, tympanic membrane, external ear and ear canal normal.  Nose: Nose normal.  Mouth/Throat: Oropharynx is clear and moist. No oropharyngeal exudate.  Eyes: Conjunctivae normal and EOM are normal. Pupils are equal, round, and reactive to light. No scleral icterus.  Neck: Normal range of motion. Neck supple. No thyromegaly present.  Cardiovascular: Normal rate, regular rhythm, normal heart sounds and intact distal pulses.   No murmur heard. Pulses:      Radial pulses are 2+ on the right side, and 2+ on the left side.  Pulmonary/Chest: Effort normal and breath sounds normal. No respiratory distress. She has no wheezes. She has no rales.  Abdominal: Soft. Bowel sounds are normal. She exhibits no distension and no mass. There is no tenderness. There is no rebound and no guarding.  Musculoskeletal:  Normal range of motion. She exhibits no edema.       L sole with 3 large hyperkeratotic calluses, anticipate plantar wart at central core, very tender to palpation  Lymphadenopathy:    She has no cervical adenopathy.  Neurological: She is alert and oriented to person, place, and time.       CN grossly intact, station and gait intact  Skin: Skin is warm and dry. No rash noted.  Psychiatric: She has a normal mood and affect. Her behavior is normal. Judgment and thought content normal.       Assessment & Plan:

## 2012-03-13 NOTE — Assessment & Plan Note (Signed)
requested records from [facility].

## 2012-03-13 NOTE — Assessment & Plan Note (Signed)
Endorsing several sxs of uncontrolled thyroid.  Check TSH today, titrate levothyroxine accordingly.

## 2012-03-13 NOTE — Assessment & Plan Note (Signed)
Presumed 2/2 plantar warts on left sole. Will refer to podiatry for further evaluation/treatment. Pt agrees with plan.

## 2012-03-13 NOTE — Assessment & Plan Note (Signed)
S/p thyroidectomy, now hypothyroid.  See below.

## 2012-03-13 NOTE — Patient Instructions (Signed)
Blood work today. Pass by [name]'s office for referral to foot doctor. We will request records from Dr. Foy Guadalajara. Return in 2-3 months for medicare wellness visit, prior fasting for blood work. Return sooner if needed.

## 2012-03-13 NOTE — Assessment & Plan Note (Signed)
Request records from GI.

## 2012-03-13 NOTE — Assessment & Plan Note (Signed)
Followed by [facility].  Recommended keep f/u with Dr. Verlon Au. Pt not happy with current regimen.  Advised her to discuss concerns with psychiatrist.

## 2012-03-13 NOTE — Assessment & Plan Note (Signed)
Endorses abstinence currently

## 2012-03-14 ENCOUNTER — Encounter: Payer: Self-pay | Admitting: *Deleted

## 2012-04-27 ENCOUNTER — Encounter: Payer: Self-pay | Admitting: Family Medicine

## 2012-04-30 ENCOUNTER — Encounter: Payer: Self-pay | Admitting: Family Medicine

## 2012-05-08 ENCOUNTER — Other Ambulatory Visit: Payer: Medicare Other

## 2012-05-11 ENCOUNTER — Other Ambulatory Visit: Payer: Self-pay | Admitting: Family Medicine

## 2012-05-11 DIAGNOSIS — K7460 Unspecified cirrhosis of liver: Secondary | ICD-10-CM

## 2012-05-11 DIAGNOSIS — B182 Chronic viral hepatitis C: Secondary | ICD-10-CM

## 2012-05-15 ENCOUNTER — Ambulatory Visit (INDEPENDENT_AMBULATORY_CARE_PROVIDER_SITE_OTHER): Payer: Medicare Other | Admitting: Family Medicine

## 2012-05-15 ENCOUNTER — Encounter: Payer: Self-pay | Admitting: Family Medicine

## 2012-05-15 VITALS — BP 128/76 | HR 68 | Temp 97.8°F | Ht 64.0 in | Wt 159.2 lb

## 2012-05-15 DIAGNOSIS — K7460 Unspecified cirrhosis of liver: Secondary | ICD-10-CM

## 2012-05-15 DIAGNOSIS — Z0000 Encounter for general adult medical examination without abnormal findings: Secondary | ICD-10-CM

## 2012-05-15 DIAGNOSIS — B182 Chronic viral hepatitis C: Secondary | ICD-10-CM

## 2012-05-15 DIAGNOSIS — K5900 Constipation, unspecified: Secondary | ICD-10-CM

## 2012-05-15 DIAGNOSIS — Z139 Encounter for screening, unspecified: Secondary | ICD-10-CM

## 2012-05-15 DIAGNOSIS — Z113 Encounter for screening for infections with a predominantly sexual mode of transmission: Secondary | ICD-10-CM | POA: Insufficient documentation

## 2012-05-15 DIAGNOSIS — F17200 Nicotine dependence, unspecified, uncomplicated: Secondary | ICD-10-CM

## 2012-05-15 DIAGNOSIS — E890 Postprocedural hypothyroidism: Secondary | ICD-10-CM

## 2012-05-15 DIAGNOSIS — F319 Bipolar disorder, unspecified: Secondary | ICD-10-CM

## 2012-05-15 DIAGNOSIS — Z1211 Encounter for screening for malignant neoplasm of colon: Secondary | ICD-10-CM

## 2012-05-15 LAB — CBC WITH DIFFERENTIAL/PLATELET
Basophils Absolute: 0 10*3/uL (ref 0.0–0.1)
Eosinophils Absolute: 0.1 10*3/uL (ref 0.0–0.7)
HCT: 46 % (ref 36.0–46.0)
Lymphs Abs: 1.6 10*3/uL (ref 0.7–4.0)
MCV: 95 fl (ref 78.0–100.0)
Monocytes Absolute: 0.8 10*3/uL (ref 0.1–1.0)
Neutrophils Relative %: 55.2 % (ref 43.0–77.0)
Platelets: 91 10*3/uL — ABNORMAL LOW (ref 150.0–400.0)
RDW: 13.1 % (ref 11.5–14.6)
WBC: 5.7 10*3/uL (ref 4.5–10.5)

## 2012-05-15 LAB — LIPID PANEL: Cholesterol: 163 mg/dL (ref 0–200)

## 2012-05-15 LAB — COMPREHENSIVE METABOLIC PANEL
Albumin: 3.7 g/dL (ref 3.5–5.2)
BUN: 11 mg/dL (ref 6–23)
Calcium: 8.6 mg/dL (ref 8.4–10.5)
Chloride: 106 mEq/L (ref 96–112)
Glucose, Bld: 85 mg/dL (ref 70–99)
Potassium: 4.2 mEq/L (ref 3.5–5.1)

## 2012-05-15 MED ORDER — POLYETHYLENE GLYCOL 3350 17 GM/SCOOP PO POWD: 17.0000 g | Freq: Every day | ORAL | Status: DC

## 2012-05-15 NOTE — Patient Instructions (Signed)
Stool kit today. EKG today. For constipation - start miralax powder once daily (hold for diarrhea) - with a lot of water to help it work.  If not improved let me know. Blood work today Return in 6 months for follow up, sooner if needed.

## 2012-05-15 NOTE — Progress Notes (Signed)
Subjective:    Patient ID: [name], female    DOB: 1955-10-22, 57 y.o.   MRN: [id number]  HPI CC: welcome to medicare visit  Received medicare since 01/2012. Lots of family stress recently.  Step mother not doing well.  Hospice involved. Smoking 1/2 ppd. Significant constipation - relieved with fleet laxatives.  Endorses good water and fiber intake. Decreased sex drive noted.  Weight gain noted.  Compensated HCV cirrhosis - followed by hepatologist at [place].  Menopause at early age - late 41s.  LMP at age 51.  No spotting/bleeding since.  Lives alone. Divorced. No children  Occupation: disability for bipolar  Activity: no regular exercise  Diet: some water, fruits/vegetables occasional  Hearing and vision screen passed today. Denies depression/anhedonia. + 2 falls in last year - fall down steps.  Didn't injure herself.  No prodromal sxs.  Denies dizziness, lightneadedness, presycope, imbalance.  All mechanical falls.  Preventative:  Colon cancer screening - never colonoscopy.  Requests stool kit.  No blood in stool. No fmhx colon cancer Mammogram - 09/2011 WNL Well woman with OBGYN - done 09/2011 ([facility]).  States normal. Flu shot - declines  UTD hep A/B shots Tetanus shot - declines Advanced directives: did not discuss given age  Medications and allergies reviewed and updated in chart.  Past histories reviewed and updated if relevant as below. Patient Active Problem List  Diagnosis  . Hypothyroidism, postsurgical  . History of alcohol abuse  . Smoker  . Chronic hepatitis C  . GERD (gastroesophageal reflux disease)  . Bipolar 1 disorder  . Migraines  . Cirrhosis  . Corns and callus   Past Medical History  Diagnosis Date  . Migraines   . Acquired hypothyroidism     thyroid goiter s/p thyroidectomy  . Bipolar 1 disorder 1990s  . Insomnia   . Varicose veins   . GERD (gastroesophageal reflux disease)     Hpylori + and treated (2012)  . Depression    states seen weekly at [facility]  . Complication of anesthesia     states 15 yrs ago, hard time waking up, states cant breathe when coming out of anesthesia  . Chronic hepatitis C     genotype 1a  . Compensated HCV cirrhosis   . Smoker   . History of alcohol abuse   . Portal hypertensive gastropathy 2013  . Portal hypertension    Past Surgical History  Procedure Laterality Date  . Appendectomy    . Liver biopsy    . Diagnostic laparoscopy      15 yrs ago  . Thyroidectomy  05/17/2011    Procedure: TOTAL THYROIDECTOMY;  Surgeon: Velora Heckler, MD  . Left foot surgery  1990s  . Esophagogastroduodenoscopy  10/2010    nl esophagus, HH, portal hypertensive gastropathy, erosive gastropathy, Hpylori + (Dr. Karrie Doffing)   History  Substance Use Topics  . Smoking status: Current Every Day Smoker -- 0.50 packs/day for 40 years    Types: Cigarettes  . Smokeless tobacco: Former Neurosurgeon     Comment: decreasing use 05/30/2011  . Alcohol Use: No   Family History  Problem Relation Age of Onset  . Heart disease Mother     pacemaker  . Cancer Father     bladder  . Diabetes Father   . Hypertension Father   . Cancer Father     melanoma   Allergies  Allergen Reactions  . Demerol Anaphylaxis  . Penicillins Anaphylaxis, Hives and Itching  .  Percocet (Oxycodone-Acetaminophen) Hives, Shortness Of Breath and Itching  . Percodan (Oxycodone-Aspirin) Hives, Shortness Of Breath and Itching  . Aspirin Nausea And Vomiting  . Morphine And Related Other (See Comments)    Trouble breathing, rash   Current Outpatient Prescriptions on File Prior to Visit  Medication Sig Dispense Refill  . benztropine (COGENTIN) 2 MG tablet Take 2 mg by mouth 2 (two) times daily.      Marland Kitchen lamoTRIgine (LAMICTAL) 200 MG tablet Take 200 mg by mouth daily.      Marland Kitchen SYNTHROID 88 MCG tablet TAKE 1 TABLET (88 MCG TOTAL) BY MOUTH DAILY.  30 tablet  3  . citalopram (CELEXA) 40 MG tablet Take 40 mg by mouth daily.       .  risperiDONE (RISPERDAL) 3 MG tablet Take 3 mg by mouth daily.       No current facility-administered medications on file prior to visit.     Review of Systems  Constitutional: Positive for unexpected weight change (gaining weight). Negative for fever, chills, activity change, appetite change and fatigue.  HENT: Negative for hearing loss and neck pain.   Eyes: Negative for visual disturbance.  Respiratory: Positive for cough. Negative for chest tightness, shortness of breath and wheezing.   Cardiovascular: Negative for chest pain, palpitations and leg swelling.  Gastrointestinal: Negative for nausea, vomiting, abdominal pain, diarrhea, constipation, blood in stool and abdominal distention.  Genitourinary: Negative for hematuria and difficulty urinating.  Musculoskeletal: Negative for myalgias and arthralgias.  Skin: Negative for rash.  Neurological: Negative for dizziness, seizures, syncope and headaches.  Hematological: Does not bruise/bleed easily.  Psychiatric/Behavioral: Positive for dysphoric mood. The patient is not nervous/anxious.        Objective:   Physical Exam  Nursing note and vitals reviewed. Constitutional: She is oriented to person, place, and time. She appears well-developed and well-nourished. No distress.  HENT:  Head: Normocephalic and atraumatic.  Right Ear: Hearing, tympanic membrane, external ear and ear canal normal.  Left Ear: Hearing, tympanic membrane, external ear and ear canal normal.  Nose: Nose normal.  Mouth/Throat: Oropharynx is clear and moist. No oropharyngeal exudate.  Eyes: Conjunctivae and EOM are normal. Pupils are equal, round, and reactive to light. No scleral icterus.  Neck: Normal range of motion. Neck supple. No thyromegaly present.  Cardiovascular: Normal rate, regular rhythm, normal heart sounds and intact distal pulses.   No murmur heard. Pulses:      Radial pulses are 2+ on the right side, and 2+ on the left side.  Pulmonary/Chest:  Effort normal and breath sounds normal. No respiratory distress. She has no wheezes. She has no rales.  Abdominal: Soft. Bowel sounds are normal. She exhibits no distension and no mass. There is no tenderness. There is no rebound and no guarding.  Genitourinary:  deferred  Musculoskeletal: Normal range of motion. She exhibits no edema.  Lymphadenopathy:    She has no cervical adenopathy.  Neurological: She is alert and oriented to person, place, and time.  CN grossly intact, station and gait intact  Skin: Skin is warm and dry. No rash noted.  Psychiatric: She has a normal mood and affect. Her behavior is normal. Judgment and thought content normal.       Assessment & Plan:

## 2012-05-15 NOTE — Assessment & Plan Note (Addendum)
I have personally reviewed the Medicare Annual Wellness questionnaire and have noted 1. The patient's medical and social history 2. Their use of alcohol, tobacco or illicit drugs 3. Their current medications and supplements 4. The patient's functional ability including ADL's, fall risks, home safety risks and hearing or visual impairment. 5. Diet and physical activity 6. Evidence for depression or mood disorders The patients weight, height, BMI have been recorded in the chart.  Hearing and vision has been addressed. I have made referrals, counseling and provided education to the patient based review of the above and I have provided the pt with a written personalized care plan for preventive services. See scanned questionairre. Advanced directives not discussed  Welcome to medicare EKG today - normal sinus brady at rate 50s, normal axis, intervals, no acute ST/T changes. Reviewed preventative protocols and updated unless pt declined. Declines tetanus shot. iFOB today. Well woman with OBGYN. 2+ recurrent falls - all mechanical.  Encouraged to move more slowly, be more cognisent of surroundings.

## 2012-05-16 ENCOUNTER — Encounter: Payer: Self-pay | Admitting: Family Medicine

## 2012-05-16 DIAGNOSIS — K5909 Other constipation: Secondary | ICD-10-CM | POA: Insufficient documentation

## 2012-05-16 NOTE — Assessment & Plan Note (Signed)
Check CBC, CMP today. Seems compensated cirrhosis.

## 2012-05-16 NOTE — Assessment & Plan Note (Signed)
Continue to encourage cessation. 

## 2012-05-16 NOTE — Assessment & Plan Note (Signed)
Stable on current regimen - continue. 

## 2012-05-16 NOTE — Assessment & Plan Note (Signed)
Compensated 

## 2012-05-16 NOTE — Assessment & Plan Note (Signed)
Thyroid level at goal.

## 2012-05-16 NOTE — Assessment & Plan Note (Signed)
Encouraged to start miralax daily with water. If not better, return for further evaluation. Advised against regular use of laxatives.

## 2012-06-24 ENCOUNTER — Other Ambulatory Visit: Payer: Self-pay | Admitting: *Deleted

## 2012-06-24 MED ORDER — SYNTHROID 88 MCG PO TABS: ORAL_TABLET | ORAL | Status: DC

## 2012-08-05 ENCOUNTER — Encounter: Payer: Self-pay | Admitting: Family Medicine

## 2012-08-05 ENCOUNTER — Telehealth: Payer: Self-pay

## 2012-08-05 ENCOUNTER — Ambulatory Visit (INDEPENDENT_AMBULATORY_CARE_PROVIDER_SITE_OTHER): Payer: Medicare Other | Admitting: Family Medicine

## 2012-08-05 VITALS — BP 118/68 | HR 60 | Temp 98.1°F | Wt 163.5 lb

## 2012-08-05 DIAGNOSIS — B182 Chronic viral hepatitis C: Secondary | ICD-10-CM

## 2012-08-05 DIAGNOSIS — E890 Postprocedural hypothyroidism: Secondary | ICD-10-CM

## 2012-08-05 DIAGNOSIS — M7989 Other specified soft tissue disorders: Secondary | ICD-10-CM | POA: Insufficient documentation

## 2012-08-05 DIAGNOSIS — M25562 Pain in left knee: Secondary | ICD-10-CM

## 2012-08-05 DIAGNOSIS — R209 Unspecified disturbances of skin sensation: Secondary | ICD-10-CM

## 2012-08-05 DIAGNOSIS — R202 Paresthesia of skin: Secondary | ICD-10-CM

## 2012-08-05 DIAGNOSIS — K7460 Unspecified cirrhosis of liver: Secondary | ICD-10-CM

## 2012-08-05 DIAGNOSIS — M25569 Pain in unspecified knee: Secondary | ICD-10-CM

## 2012-08-05 LAB — COMPREHENSIVE METABOLIC PANEL
ALT: 92 U/L — ABNORMAL HIGH (ref 0–35)
AST: 119 U/L — ABNORMAL HIGH (ref 0–37)
Albumin: 3.5 g/dL (ref 3.5–5.2)
Alkaline Phosphatase: 60 U/L (ref 39–117)
Potassium: 4 mEq/L (ref 3.5–5.1)
Sodium: 138 mEq/L (ref 135–145)
Total Bilirubin: 0.7 mg/dL (ref 0.3–1.2)
Total Protein: 7 g/dL (ref 6.0–8.3)

## 2012-08-05 LAB — TSH: TSH: 0.58 u[IU]/mL (ref 0.35–5.50)

## 2012-08-05 LAB — CBC WITH DIFFERENTIAL/PLATELET
Basophils Absolute: 0 10*3/uL (ref 0.0–0.1)
HCT: 43.5 % (ref 36.0–46.0)
Hemoglobin: 14.7 g/dL (ref 12.0–15.0)
Lymphs Abs: 1.6 10*3/uL (ref 0.7–4.0)
MCHC: 33.8 g/dL (ref 30.0–36.0)
MCV: 93.5 fl (ref 78.0–100.0)
Monocytes Absolute: 0.8 10*3/uL (ref 0.1–1.0)
Monocytes Relative: 13.9 % — ABNORMAL HIGH (ref 3.0–12.0)
Neutro Abs: 3.2 10*3/uL (ref 1.4–7.7)
RDW: 13.5 % (ref 11.5–14.6)

## 2012-08-05 LAB — PROTIME-INR: INR: 1.1 ratio — ABNORMAL HIGH (ref 0.8–1.0)

## 2012-08-05 NOTE — Progress Notes (Addendum)
Subjective:    Patient ID: [name], female    DOB: [birth year], 57 y.o.   MRN: [id number]  HPI CC: leg pain and swelling  1 wk ago noticed numbness and tingling of R hand and forearm.  Persistent pin/needle sensation in R digits.  L hand spared.  Yesterday noticed bilateral feet and legs to knees swollen.  L foot/leg feels very numb and achey pain of left leg from toes up to thighs.   Some Gi upset with nausea recently, worse with food.  Denies back pain.   No recent immunizations or viral illness. No fevers/chills, extremity weakness. No new joint pains, rashes. No abd pain.  Noticed R neck swelling.   Wt Readings from Last 3 Encounters:  08/05/12 163 lb 8 oz (74.163 kg)  05/15/12 159 lb 4 oz (72.235 kg)  03/13/12 161 lb (73.029 kg)   Past Medical History  Diagnosis Date  . Migraines   . Acquired hypothyroidism     thyroid goiter s/p thyroidectomy  . Bipolar 1 disorder 1990s  . Insomnia   . Varicose veins   . GERD (gastroesophageal reflux disease)     Hpylori + and treated (2012)  . Depression     states seen weekly at [facility]  . Complication of anesthesia     states 15 yrs ago, hard time waking up, states cant breathe when coming out of anesthesia  . Chronic hepatitis C     genotype 1a  . Compensated HCV cirrhosis   . Smoker   . History of alcohol abuse   . Portal hypertensive gastropathy 2013  . Portal hypertension    Past Surgical History  Procedure Laterality Date  . Appendectomy    . Liver biopsy    . Diagnostic laparoscopy      15 yrs ago  . Thyroidectomy  05/17/2011    Procedure: TOTAL THYROIDECTOMY;  Surgeon: Velora Heckler, MD  . Left foot surgery  1990s  . Esophagogastroduodenoscopy  10/2010    nl esophagus, HH, portal hypertensive gastropathy, erosive gastropathy, Hpylori + (Dr. Karrie Doffing)   Review of Systems Per HPI    Objective:   Physical Exam  Nursing note and vitals reviewed. Constitutional: She appears well-developed and  well-nourished. No distress.  HENT:  Head: Normocephalic and atraumatic.  Right Ear: Hearing, tympanic membrane, external ear and ear canal normal.  Left Ear: Hearing, tympanic membrane, external ear and ear canal normal.  Mouth/Throat: Uvula is midline, oropharynx is clear and moist and mucous membranes are normal. No oropharyngeal exudate, posterior oropharyngeal edema, posterior oropharyngeal erythema or tonsillar abscesses.  Upper and lowe dentures  Eyes: Conjunctivae and EOM are normal. Pupils are equal, round, and reactive to light. No scleral icterus.  Neck: Normal range of motion. Neck supple. No thyromegaly present.  Cardiovascular: Normal rate, regular rhythm, normal heart sounds and intact distal pulses.   No murmur heard. Pulmonary/Chest: Effort normal and breath sounds normal. No respiratory distress. She has no wheezes. She has no rales.  Abdominal: Soft. Normal appearance and bowel sounds are normal. She exhibits no distension and no mass. There is no hepatosplenomegaly. There is tenderness (mild) in the right upper quadrant and suprapubic area. There is no rigidity, no rebound, no guarding, no CVA tenderness and negative Murphy's sign.  Musculoskeletal: She exhibits no edema.  Tender to palpation bilateral lower legs but no evident peripheral edema 2+ DP bilaterally  Lymphadenopathy:    She has no cervical adenopathy.  Neurological: She has normal strength.  A sensory deficit is present.  Temperature sensation intact. Sensation to light touch diminished on right hand compared to left 5/5 strength BUE/BLE Grip strength intact  Skin: Skin is warm and dry. Rash noted.  Spider angiomata throughout upper extremities       Assessment & Plan:

## 2012-08-05 NOTE — Assessment & Plan Note (Addendum)
Associated with paresthesias. No significant swelling noted today - will check blood work to eval liver function today given concern for progression of cirrhosis, as well as vit B12 levels. I also recommended she f/u with hepatology as she is overdue for f/u. Doubt cardiac or renal cause, doubt polyneruopathy-type picture In interim, elevate legs and I suggested she try compression stockings (will await blood work prior to filling).

## 2012-08-05 NOTE — Telephone Encounter (Signed)
Will see today.  

## 2012-08-05 NOTE — Telephone Encounter (Signed)
One week ago rt arm pain goes into mid chest on and off (sharp pain), no chest pain now.feet and ankles swelling for months but more swollen on 08/04/12 and did not go down but slightly overnight. Lt lower leg feels numb and tingly but pt can walk.No SOB but slight wheezing for awhile, pt has cough on and off. Pt request appt with Dr [name]. Pt scheduled to see Dr [name] today at 11:15 am.(pt could not come before 11 am). Advised pt if condition changes or worsens to call back or go to UC. Pt voiced understanding.

## 2012-08-05 NOTE — Assessment & Plan Note (Signed)
Check blood work today.

## 2012-08-05 NOTE — Patient Instructions (Signed)
Blood work today to check on numbness and swelling. In meantime, elevate legs as much as able. We may talk about compression stockings - prescription provided today, but don't fill until you hear from [place]. I do want you to set up appointment with liver doctor for follow up.

## 2012-08-06 ENCOUNTER — Other Ambulatory Visit: Payer: Self-pay | Admitting: *Deleted

## 2012-08-06 MED ORDER — LAMOTRIGINE 200 MG PO TABS: 200.0000 mg | ORAL_TABLET | Freq: Every day | ORAL | Status: DC

## 2012-08-06 NOTE — Telephone Encounter (Signed)
Her psychiatrist should continue to fill these medications.

## 2012-08-06 NOTE — Telephone Encounter (Signed)
Ok to refill? Patient called and confirmed dosing.

## 2012-08-11 ENCOUNTER — Telehealth: Payer: Self-pay | Admitting: Family Medicine

## 2012-08-11 NOTE — Telephone Encounter (Signed)
Patient Information:  Caller Name: Tniya  Phone: [phone]  Patient: [name], [name]  Gender: Female  DOB: [birth year]  Age: 57 Years  PCP: Eustaquio Boyden [facility])  Office Follow Up:  Does the office need to follow up with this patient?: No  Instructions For The Office: N/A   Symptoms  Reason For Call & Symptoms: Akiba states she was told to callback this week" if numbness and tingling were no better" and schedule appointment. Was seen in office on 08/05/12.. Had swelling up to thighs and numbness and tingling in fingers of right hand. States swelling is better but not resolved. Was instructed to schedule appointment with Hepatology at [facility]. Leoni states she scheduled an appointment on September 3rd at 09:30. Per cecc protocol for numbness and tingling  has see provider within 72 hrs due to numbness and tingling not realted to trauma which has not responded to 24 hrs of home care.  Appointment scheduled by Rose/Scheduler for 08/14/12 at 1445pm with Dr. Sharen Hones. Advised to callback if needed.  Reviewed Health History In EMR: Yes  Reviewed Medications In EMR: Yes  Reviewed Allergies In EMR: Yes  Reviewed Surgeries / Procedures: Yes  Date of Onset of Symptoms: 07/29/2012  Treatments Tried: Elevating legs  Treatments Tried Worked: Yes  Guideline(s) Used:  No Protocol Available - Sick Adult  Disposition Per Guideline:   See Within 3 Days in Office  Reason For Disposition Reached:   Nursing judgment  Advice Given:  N/A  Patient Will Follow Care Advice:  YES  Appointment Scheduled:  08/14/2012 14:45:00 Appointment Scheduled Provider:  Eustaquio Boyden [facility])

## 2012-08-14 ENCOUNTER — Encounter: Payer: Self-pay | Admitting: Family Medicine

## 2012-08-14 ENCOUNTER — Ambulatory Visit (INDEPENDENT_AMBULATORY_CARE_PROVIDER_SITE_OTHER): Payer: Medicare Other | Admitting: Family Medicine

## 2012-08-14 VITALS — BP 140/90 | HR 92 | Temp 98.3°F | Wt 161.2 lb

## 2012-08-14 DIAGNOSIS — K219 Gastro-esophageal reflux disease without esophagitis: Secondary | ICD-10-CM

## 2012-08-14 DIAGNOSIS — R1013 Epigastric pain: Secondary | ICD-10-CM | POA: Insufficient documentation

## 2012-08-14 DIAGNOSIS — R209 Unspecified disturbances of skin sensation: Secondary | ICD-10-CM

## 2012-08-14 DIAGNOSIS — R202 Paresthesia of skin: Secondary | ICD-10-CM | POA: Insufficient documentation

## 2012-08-14 DIAGNOSIS — K3189 Other diseases of stomach and duodenum: Secondary | ICD-10-CM

## 2012-08-14 MED ORDER — PANTOPRAZOLE SODIUM 40 MG PO TBEC: 40.0000 mg | DELAYED_RELEASE_TABLET | Freq: Every day | ORAL | Status: DC

## 2012-08-14 NOTE — Patient Instructions (Addendum)
For bloating - I think this is dyspepsia - or exacerbation of heartburn.  Treat with protonix daily for next 3 weeks then as needed.  If worsening, or not better with treatment, let me know for abdominal ultrasound to evaluate gallbladder. For right hand numbness - we will place you in right wrist brace to use at night time.  Also pass by up front to schedule nerve conduction study to evaluate for carpal tunnel syndrome.

## 2012-08-14 NOTE — Assessment & Plan Note (Signed)
Suspicious for CTS, so have placed in R wrist brace at night and will order nerve conduction study to further evaluate. Lab Results  Component Value Date   VITAMINB12 696 08/05/2012  Leg swelling/paresthesias have resolved.

## 2012-08-14 NOTE — Assessment & Plan Note (Signed)
Start protonix daily for 3 wks, update if sxs persist. See above.

## 2012-08-14 NOTE — Assessment & Plan Note (Signed)
Recently deteriorated, presenting as bloating/indigestion and increased heartburn. Anticipate GERD vs dyspepsia - will treat with 3wk course of PPI (protonix as on celexa) - and if persistent or any worsening, low threshold to obtain abd [place]. Has f/u appt with [facility] 11/2012. Pt agrees with plan.

## 2012-08-14 NOTE — Progress Notes (Signed)
  Subjective:    Patient ID: [name], female    DOB: 01/05/1956, 57 y.o.   MRN: [id number]  HPI CC: paresthesias  See prior note for details.  Blood work all unrevealing.  Persistent numbness mainly in R hand, palmar surface.  No trouble in back of hand.  No h/o CTS.  Mild paresthesias in bottom of feet.  Notes decreased appetite - eats half sandwich and feels full - and bloated abdomen for rest of day.  Noticing this more over the last week.   Noticing persistent reflux and indigestion.  Worse with onions, peppers or tomatoes. No dysphagia.  Wt Readings from Last 3 Encounters:  08/14/12 161 lb 4 oz (73.143 kg)  08/05/12 163 lb 8 oz (74.163 kg)  05/15/12 159 lb 4 oz (72.235 kg)    Past Medical History  Diagnosis Date  . Migraines   . Acquired hypothyroidism     thyroid goiter s/p thyroidectomy  . Bipolar 1 disorder 1990s  . Insomnia   . Varicose veins   . GERD (gastroesophageal reflux disease)     Hpylori + and treated (2012)  . Depression     states seen weekly at [facility]  . Complication of anesthesia     states 15 yrs ago, hard time waking up, states cant breathe when coming out of anesthesia  . Chronic hepatitis C     genotype 1a  . Compensated HCV cirrhosis   . Smoker   . History of alcohol abuse   . Portal hypertensive gastropathy 2013  . Portal hypertension      Review of Systems Per HPI    Objective:   Physical Exam  Nursing note and vitals reviewed. Constitutional: She appears well-developed and well-nourished. No distress.  HENT:  Mouth/Throat: Oropharynx is clear and moist. No oropharyngeal exudate.  Eyes: Conjunctivae and EOM are normal. Pupils are equal, round, and reactive to light.  Cardiovascular: Normal rate, regular rhythm, normal heart sounds and intact distal pulses.   No murmur heard. Pulmonary/Chest: Effort normal and breath sounds normal. No respiratory distress. She has no wheezes. She has no rales.  Abdominal: Soft.  Bowel sounds are normal. She exhibits no distension and no mass. There is no hepatosplenomegaly. There is tenderness (moderate to deep palpation) in the right upper quadrant. There is no rigidity, no rebound, no guarding and negative Murphy's sign.  Musculoskeletal: She exhibits no edema.  FROM at hands/wrists. No deformity appreciated  Neurological: She has normal strength. She displays no atrophy. A sensory deficit is present. She exhibits normal muscle tone.  Temperature sensation hyperacute right hand. Grip strength intact. Sensation to light touch diminished along dorsal digit tips and entire palmar surface of right hand, normal on left. Mildly positive tinel's, negative phalen.  Skin: Skin is warm and dry. No rash noted.       Assessment & Plan:

## 2012-08-19 ENCOUNTER — Other Ambulatory Visit (INDEPENDENT_AMBULATORY_CARE_PROVIDER_SITE_OTHER): Payer: Medicare Other

## 2012-08-19 ENCOUNTER — Encounter: Payer: Self-pay | Admitting: *Deleted

## 2012-08-19 DIAGNOSIS — Z1211 Encounter for screening for malignant neoplasm of colon: Secondary | ICD-10-CM

## 2012-08-19 LAB — FECAL OCCULT BLOOD, IMMUNOCHEMICAL: Fecal Occult Bld: NEGATIVE

## 2012-08-21 ENCOUNTER — Telehealth: Payer: Self-pay | Admitting: Family Medicine

## 2012-08-21 MED ORDER — FUROSEMIDE 20 MG PO TABS: 10.0000 mg | ORAL_TABLET | Freq: Every day | ORAL | Status: DC | PRN

## 2012-08-21 NOTE — Telephone Encounter (Signed)
Patient said when she came in the other day, the swelling had gotten better, but then they started swelling again. She was notified to try 1/2 tab first for a few days to see how it works & if no help then increase to a whole pill. She verbalized understanding and will call with an update.

## 2012-08-21 NOTE — Telephone Encounter (Signed)
Patient Information:  Caller Name: [name]  Phone: [phone]  Patient: [name], [name]  Gender: Female  DOB: 07/11/1955  Age: 57 Years  PCP: Eustaquio Boyden [facility])  Office Follow Up:  Does the office need to follow up with this patient?: Yes  Instructions For The Office: OFFICE PLEASE FOLLOW UP WITH PT.  DOES PT NEED OFFICE VISIT OR MEDICATION?  RN Note:  pt has been into office twice.  Pt states she really did not want to come back in unless Dr Reece Agar felt it was necessary.  Symptoms  Reason For Call & Symptoms: swelling in feet and legs.  Pt was seen in the office 08/05/12 for same symptoms.  Pt states swelling still has not gone down.  Pt is reporting pain as 8/10 due to swelling.  Pt is elevating feet, drinking fluids, restricting salt  Reviewed Health History In EMR: Yes  Reviewed Medications In EMR: Yes  Reviewed Allergies In EMR: Yes  Reviewed Surgeries / Procedures: Yes  Date of Onset of Symptoms: 08/05/2012  Guideline(s) Used:  Leg Swelling and Edema  Disposition Per Guideline:   See Today in Office  Reason For Disposition Reached:   Moderate swelling of both ankles (e.g., swelling extends up to the knees) AND new onset or worsening  Advice Given:  N/A  Patient Refused Recommendation:  Patient Refused Care Advice  Pt wants Dr Reece Agar made aware that her symptoms have not improved any (maybe getting worse) with leg swelling. Pt wants to know if she needs to be seen again or if medication can be prescribed

## 2012-08-21 NOTE — Telephone Encounter (Signed)
Caller: [place]/Patient; Phone: [phone]; Reason for Call: Pt states she was seen in the office last week and still having swelling and pain.  Pt disconnected the call when nurse started triage.  PLEASE F/U WITH PT TO ADVISE IF APPT NEEDED.  THANK YOU.

## 2012-08-21 NOTE — Telephone Encounter (Signed)
When I saw her in follow up on 08/14/2012 she had said leg swelling had improved. Lab Results  Component Value Date   CREATININE 0.7 08/05/2012   She can try 1/2 to 1 pill of lasix 20mg  which is a water pill as needed for swelling (sent into pharamcy).  To let me know how this is helping.

## 2012-08-21 NOTE — Telephone Encounter (Signed)
See additional note. 

## 2012-11-12 ENCOUNTER — Ambulatory Visit (INDEPENDENT_AMBULATORY_CARE_PROVIDER_SITE_OTHER): Payer: Medicare Other | Admitting: Family Medicine

## 2012-11-12 ENCOUNTER — Encounter: Payer: Self-pay | Admitting: Family Medicine

## 2012-11-12 VITALS — BP 124/72 | HR 72 | Temp 97.8°F | Wt 162.5 lb

## 2012-11-12 DIAGNOSIS — R209 Unspecified disturbances of skin sensation: Secondary | ICD-10-CM

## 2012-11-12 DIAGNOSIS — R202 Paresthesia of skin: Secondary | ICD-10-CM

## 2012-11-12 DIAGNOSIS — E663 Overweight: Secondary | ICD-10-CM

## 2012-11-12 NOTE — Assessment & Plan Note (Signed)
Workup so far unrevealing. Pt has declined NCS for now, will consider in the future. No weakness on exam today - will continue to monitor. Check Copper levels next blood draw.

## 2012-11-12 NOTE — Progress Notes (Addendum)
  Subjective:    Patient ID: [name], female    DOB: 23-Jul-1955, 57 y.o.   MRN: [id number]  HPI CC: 65mo f/u  See prior note for details. Blood work for paresthesias all unrevealing.  Persistent numbness mainly in R hand, palmar and dorsal surface. No h/o CTS. Never had NCS done 2/2 concerned about needle procedure. No persistent neck pain.  Unable to tolerate lasix prn swelling - felt some dyspnea from med.  Has appt with [facility] next month to follow cirrhosis.  Wt Readings from Last 3 Encounters:  11/12/12 162 lb 8 oz (73.71 kg)  08/14/12 161 lb 4 oz (73.143 kg)  08/05/12 163 lb 8 oz (74.163 kg)  worried about weight gain - 25lb weight gain in last 4 years. walks daily - 23min/day 5d/wk.  Tries to adhere to healthy diet. Does use slimfast regularly.  Fruits/vegetables daily.  Drinks water and coffee.  Avoids sodas.  Gardens for her vegetables.  Avoids fried greasy foods.  Avoids salt in diet.   Past Medical History  Diagnosis Date  . Migraines   . Acquired hypothyroidism     thyroid goiter s/p thyroidectomy  . Bipolar 1 disorder 1990s  . Insomnia   . Varicose veins   . GERD (gastroesophageal reflux disease)     Hpylori + and treated (2012)  . Depression     states seen weekly at [facility]  . Complication of anesthesia     states 15 yrs ago, hard time waking up, states cant breathe when coming out of anesthesia  . Chronic hepatitis C     genotype 1a  . Compensated HCV cirrhosis   . Smoker   . History of alcohol abuse   . Portal hypertensive gastropathy 2013  . Portal hypertension      Review of Systems Per HPI    Objective:   Physical Exam  Nursing note and vitals reviewed. Constitutional: She appears well-developed and well-nourished. No distress.  HENT:  Mouth/Throat: Oropharynx is clear and moist. No oropharyngeal exudate.  Eyes: Conjunctivae and EOM are normal. Pupils are equal, round, and reactive to light.  Neck: Normal range of motion. Neck  supple. No thyromegaly present.  Cardiovascular: Normal rate, regular rhythm, normal heart sounds and intact distal pulses.   No murmur heard. Pulmonary/Chest: Effort normal and breath sounds normal. No respiratory distress. She has no wheezes. She has no rales.  Musculoskeletal: She exhibits no edema.  Neurological:  Endorses decreased sensation R dorsal and palmar hand as well as up lateral right arm 5/5 strength BUE  Skin: Skin is warm and dry.  Psychiatric: She has a normal mood and affect.       Assessment & Plan:

## 2012-11-12 NOTE — Patient Instructions (Addendum)
If you do decide to have nerve conduction study, let us know. Remind [place] to check copper levels next blood work  Increase walking to 45 min sessions.  We will continue to watch weight. Return to see [place] in 6 months for follow up

## 2012-11-12 NOTE — Assessment & Plan Note (Signed)
Continue to monitor. Reviewed diet and healthy choices. Recommended increased activity - incresae walking to 45 min at a time 5x/wk. Pt agrees with plan.

## 2012-11-14 ENCOUNTER — Ambulatory Visit: Payer: Medicare Other | Admitting: Family Medicine

## 2012-12-12 ENCOUNTER — Other Ambulatory Visit: Payer: Self-pay | Admitting: Family Medicine

## 2012-12-23 ENCOUNTER — Ambulatory Visit (INDEPENDENT_AMBULATORY_CARE_PROVIDER_SITE_OTHER): Payer: Medicare Other | Admitting: Family Medicine

## 2012-12-23 ENCOUNTER — Encounter: Payer: Self-pay | Admitting: Family Medicine

## 2012-12-23 VITALS — BP 120/70 | HR 75 | Temp 98.1°F | Ht 64.0 in | Wt 164.0 lb

## 2012-12-23 DIAGNOSIS — IMO0002 Reserved for concepts with insufficient information to code with codable children: Secondary | ICD-10-CM

## 2012-12-23 DIAGNOSIS — M5410 Radiculopathy, site unspecified: Secondary | ICD-10-CM | POA: Insufficient documentation

## 2012-12-23 MED ORDER — PREDNISONE 20 MG PO TABS: ORAL_TABLET | ORAL | Status: DC

## 2012-12-23 NOTE — Patient Instructions (Addendum)
Start and complete prednisone course. Heat, massage and stretching.  When done prednisone you can use advil 800 mg every 8 hours as needed for pain on full stomach.  Follow up with PCP in 2 weeks if not improving as expected... Call sooner if severe pain. No lifting > 10 pounds. No repetitive twisting/bending.

## 2012-12-23 NOTE — Progress Notes (Signed)
  Subjective:    Patient ID: [name], female    DOB: 1956/02/17, 57 y.o.   MRN: [id number]  HPI  57 year old female pt of Dr. Patrice Paradise with history of GERD, chronic hepatitis C and cirrhosis who presents with  New onset low back pain x 3 days. Started in central low back, severe, now referred to right leg.  No numbness, no weakness.  No fever.  She has tried advil 200 mg BID, aleve, tylenol with minimal relief.  She has been trying to lay on floor, stretching, and heat ing pad as well as ice.  No proceeding fall or change in activity.  No history of back problems or surgery.     Review of Systems  Constitutional: Negative for fever and fatigue.  HENT: Negative for congestion.   Eyes: Negative for pain.  Respiratory: Negative for shortness of breath.   Cardiovascular: Positive for leg swelling. Negative for chest pain.       Followed by hep C clinic.Marland Kitchen Placed on low salt diet.  Gastrointestinal: Negative for abdominal pain.  Genitourinary: Negative for dysuria, urgency, frequency, flank pain and decreased urine volume.       Objective:   Physical Exam  Constitutional: Vital signs are normal. She appears well-developed and well-nourished. She is cooperative.  Non-toxic appearance. She does not appear ill. No distress.  HENT:  Head: Normocephalic.  Right Ear: Hearing, tympanic membrane, external ear and ear canal normal. Tympanic membrane is not erythematous, not retracted and not bulging.  Left Ear: Hearing, tympanic membrane, external ear and ear canal normal. Tympanic membrane is not erythematous, not retracted and not bulging.  Nose: No mucosal edema or rhinorrhea. Right sinus exhibits no maxillary sinus tenderness and no frontal sinus tenderness. Left sinus exhibits no maxillary sinus tenderness and no frontal sinus tenderness.  Mouth/Throat: Uvula is midline, oropharynx is clear and moist and mucous membranes are normal.  Eyes: Conjunctivae, EOM and lids are normal.  Pupils are equal, round, and reactive to light. Lids are everted and swept, no foreign bodies found.  Neck: Trachea normal and normal range of motion. Neck supple. Carotid bruit is not present. No mass and no thyromegaly present.  Cardiovascular: Normal rate, regular rhythm, S1 normal, S2 normal, normal heart sounds, intact distal pulses and normal pulses.  Exam reveals no gallop and no friction rub.   No murmur heard. Pulmonary/Chest: Effort normal and breath sounds normal. Not tachypneic. No respiratory distress. She has no decreased breath sounds. She has no wheezes. She has no rhonchi. She has no rales.  Abdominal: Soft. Normal appearance and bowel sounds are normal. There is no tenderness.  Musculoskeletal:       Lumbar back: She exhibits decreased range of motion, tenderness, bony tenderness, pain and spasm. She exhibits no swelling.  Positive SLR on right, neg faber's  Neurological: She is alert.  Skin: Skin is warm, dry and intact. No rash noted.  Psychiatric: Her speech is normal and behavior is normal. Judgment and thought content normal. Her mood appears not anxious. Cognition and memory are normal. She does not exhibit a depressed mood.          Assessment & Plan:

## 2012-12-23 NOTE — Assessment & Plan Note (Signed)
Treat with prednisone followed by NSAID. Heat, massage and strethcing. Info given. No indication for X-ray. Follow up if not improving as expected.

## 2012-12-29 ENCOUNTER — Telehealth: Payer: Self-pay

## 2012-12-29 NOTE — Telephone Encounter (Signed)
Pt was seen on 12-23-12 pt finished prednisone on 12-28-12. Pt said prednisone did not help back pain but did cause swelling in pts face and abdomen.  Pt said now pt having low back pain on both sides of back and on and off pain down rt leg. Pt has been using heat and doing stretching exercises; pt said now pain level is 8.  Pt scheduled appt 12/30/12 at 7:30 am with Dr Ermalene Searing; if condition changes or worsens prior to appt pt will go to [facility].

## 2012-12-30 ENCOUNTER — Ambulatory Visit: Payer: Medicare Other | Admitting: Family Medicine

## 2012-12-30 DIAGNOSIS — Z0289 Encounter for other administrative examinations: Secondary | ICD-10-CM

## 2012-12-30 NOTE — Telephone Encounter (Signed)
Left message for patient to return my call.

## 2012-12-30 NOTE — Telephone Encounter (Signed)
Pt mises appt.. Call for update.

## 2012-12-31 NOTE — Telephone Encounter (Signed)
Left message for patient to return my call.

## 2013-01-05 NOTE — Telephone Encounter (Signed)
Left message for patient to return my call to follow up on her back pain.  Have tried to reach patient on three different occasions without any return calls.  Will forward back to Dr. Ermalene Searing for review.

## 2013-01-06 NOTE — Telephone Encounter (Signed)
Send letter asking pt to make an appointment to be seen if her back apin is continuing or to let us know if this resolved or has been taken care of otherwise.

## 2013-01-07 ENCOUNTER — Encounter: Payer: Self-pay | Admitting: *Deleted

## 2013-01-07 NOTE — Telephone Encounter (Signed)
Letter mailed to patient.

## 2013-04-02 ENCOUNTER — Other Ambulatory Visit: Payer: Self-pay | Admitting: Family Medicine

## 2013-04-21 ENCOUNTER — Other Ambulatory Visit: Payer: Self-pay | Admitting: *Deleted

## 2013-04-21 MED ORDER — CITALOPRAM HYDROBROMIDE 40 MG PO TABS: 40.0000 mg | ORAL_TABLET | Freq: Every day | ORAL | Status: DC

## 2013-04-21 MED ORDER — ARIPIPRAZOLE 10 MG PO TABS: 10.0000 mg | ORAL_TABLET | Freq: Every day | ORAL | Status: DC

## 2013-04-21 NOTE — Telephone Encounter (Signed)
I belive this is Dr. Synthia Innocent patient? He has seen multiple time in 2013 and 2014, as well as for CPX.  I believe PCP is listed wrong.  Let me know if I am incorrent.

## 2013-04-21 NOTE — Telephone Encounter (Signed)
Last office visit 12/23/2012.  Ok to refill?

## 2013-04-21 NOTE — Telephone Encounter (Signed)
Looks like PCP was chnged from Dr. Danise Mina to you in August 2014.  Patient has appointment with you 05/19/2013 @7 :15 am.

## 2013-04-21 NOTE — Telephone Encounter (Signed)
Refilled for one month to get her to her appointment with Dr. Diona Browner on 05/19/2013.

## 2013-05-04 ENCOUNTER — Ambulatory Visit: Payer: Self-pay | Admitting: Family Medicine

## 2013-05-05 ENCOUNTER — Encounter: Payer: Self-pay | Admitting: Family Medicine

## 2013-05-14 ENCOUNTER — Encounter: Payer: Self-pay | Admitting: Family Medicine

## 2013-05-14 ENCOUNTER — Ambulatory Visit: Payer: Self-pay | Admitting: Family Medicine

## 2013-05-18 ENCOUNTER — Ambulatory Visit: Payer: Medicare Other | Admitting: Family Medicine

## 2013-05-19 ENCOUNTER — Ambulatory Visit: Payer: Medicare Other | Admitting: Family Medicine

## 2013-05-31 ENCOUNTER — Other Ambulatory Visit: Payer: Self-pay | Admitting: Family Medicine

## 2013-06-02 ENCOUNTER — Ambulatory Visit: Payer: Medicare Other | Admitting: Family Medicine

## 2013-06-04 ENCOUNTER — Ambulatory Visit (INDEPENDENT_AMBULATORY_CARE_PROVIDER_SITE_OTHER): Payer: Private Health Insurance - Indemnity | Admitting: Family Medicine

## 2013-06-04 ENCOUNTER — Encounter: Payer: Self-pay | Admitting: Family Medicine

## 2013-06-04 ENCOUNTER — Telehealth: Payer: Self-pay | Admitting: Family Medicine

## 2013-06-04 ENCOUNTER — Encounter: Payer: Self-pay | Admitting: *Deleted

## 2013-06-04 VITALS — BP 130/78 | HR 61 | Temp 97.7°F | Ht 64.0 in | Wt 166.5 lb

## 2013-06-04 DIAGNOSIS — K7460 Unspecified cirrhosis of liver: Secondary | ICD-10-CM

## 2013-06-04 DIAGNOSIS — R142 Eructation: Secondary | ICD-10-CM

## 2013-06-04 DIAGNOSIS — E890 Postprocedural hypothyroidism: Secondary | ICD-10-CM

## 2013-06-04 DIAGNOSIS — Z13220 Encounter for screening for lipoid disorders: Secondary | ICD-10-CM

## 2013-06-04 DIAGNOSIS — R141 Gas pain: Secondary | ICD-10-CM

## 2013-06-04 DIAGNOSIS — B182 Chronic viral hepatitis C: Secondary | ICD-10-CM

## 2013-06-04 DIAGNOSIS — K920 Hematemesis: Secondary | ICD-10-CM

## 2013-06-04 DIAGNOSIS — R140 Abdominal distension (gaseous): Secondary | ICD-10-CM | POA: Insufficient documentation

## 2013-06-04 DIAGNOSIS — M7989 Other specified soft tissue disorders: Secondary | ICD-10-CM

## 2013-06-04 DIAGNOSIS — R143 Flatulence: Secondary | ICD-10-CM

## 2013-06-04 LAB — COMPREHENSIVE METABOLIC PANEL
ALT: 86 U/L — AB (ref 0–35)
AST: 120 U/L — AB (ref 0–37)
Albumin: 3.4 g/dL — ABNORMAL LOW (ref 3.5–5.2)
Alkaline Phosphatase: 66 U/L (ref 39–117)
BUN: 12 mg/dL (ref 6–23)
CALCIUM: 8.4 mg/dL (ref 8.4–10.5)
CHLORIDE: 108 meq/L (ref 96–112)
CO2: 26 mEq/L (ref 19–32)
Creatinine, Ser: 0.7 mg/dL (ref 0.4–1.2)
GFR: 86.93 mL/min (ref >60.00)
Glucose, Bld: 88 mg/dL (ref 70–99)
POTASSIUM: 4 meq/L (ref 3.5–5.1)
Sodium: 140 mEq/L (ref 135–145)
Total Bilirubin: 1.3 mg/dL — ABNORMAL HIGH (ref 0.3–1.2)
Total Protein: 7 g/dL (ref 6.0–8.3)

## 2013-06-04 LAB — CBC WITH DIFFERENTIAL/PLATELET
BASOS ABS: 0 10*3/uL (ref 0.0–0.1)
Basophils Relative: 0.8 % (ref 0.0–3.0)
EOS ABS: 0.1 10*3/uL (ref 0.0–0.7)
Eosinophils Relative: 2.2 % (ref 0.0–5.0)
HCT: 44 % (ref 36.0–46.0)
HEMOGLOBIN: 14.9 g/dL (ref 12.0–15.0)
LYMPHS PCT: 26.7 % (ref 12.0–46.0)
Lymphs Abs: 1.3 10*3/uL (ref 0.7–4.0)
MCHC: 33.9 g/dL (ref 30.0–36.0)
MCV: 94.2 fl (ref 78.0–100.0)
MONO ABS: 0.8 10*3/uL (ref 0.1–1.0)
Monocytes Relative: 15.2 % — ABNORMAL HIGH (ref 3.0–12.0)
NEUTROS ABS: 2.8 10*3/uL (ref 1.4–7.7)
NEUTROS PCT: 55.1 % (ref 43.0–77.0)
Platelets: 71 10*3/uL — ABNORMAL LOW (ref 150.0–400.0)
RBC: 4.67 Mil/uL (ref 3.87–5.11)
RDW: 13.8 % (ref 11.5–14.6)
WBC: 5 10*3/uL (ref 4.5–10.5)

## 2013-06-04 LAB — LIPID PANEL
CHOL/HDL RATIO: 3
Cholesterol: 158 mg/dL (ref 0–200)
HDL: 62.5 mg/dL (ref >39.00)
LDL Cholesterol: 80 mg/dL (ref 0–99)
Triglycerides: 79 mg/dL (ref 0.0–149.0)
VLDL: 15.8 mg/dL (ref 0.0–40.0)

## 2013-06-04 NOTE — Telephone Encounter (Signed)
Relevant patient education mailed to patient.  

## 2013-06-04 NOTE — Progress Notes (Signed)
Pre visit review using our clinic review tool, if applicable. No additional management support is needed unless otherwise documented below in the visit note. 

## 2013-06-04 NOTE — Patient Instructions (Addendum)
Return for medicare wellness in next few weeks/month. Stop at lab on way out.  Work on increasing exercise and healthy low fat low carb diet.

## 2013-06-04 NOTE — Assessment & Plan Note (Signed)
Stable last check 

## 2013-06-04 NOTE — Progress Notes (Signed)
Subjective:    Patient ID: Dalaya Suppa, female    DOB: 1955/04/11, 58 y.o.   MRN: [id number]  HPI  58 year old female presents for 6 month follow up. She previously saw Dr. Darnell Level but is transfering care to me.  Reviewed last OV and PMH, SH, prevention etc.  She has been gaining a lot of weight in last year. Wt Readings from Last 3 Encounters:  06/04/13 166 lb 8 oz (75.524 kg)  12/23/12 164 lb (74.39 kg)  11/12/12 162 lb 8 oz (73.71 kg)   She has noted swelling all over, hands, feels bloated over past years, gradually worsening. Fatigued.  No further paraesthesias... No need for copper test as recommended by Dr. Darnell Level.   Controlled back pain, tolerable.   Minimal exercsie, moderate diet.  BP Readings from Last 3 Encounters:  06/04/13 130/78  12/23/12 120/70  11/12/12 124/72    Overdue for medicare wellness.  Followed at [facility] for cirrhosis and hep C. Last seen 05/19/13 Had labs, [place], results not back yet. Considering new medication treatment.  In last 2 weeks episodes of blood in mouth.. Has upcoming endoscopy to evaluate this further.  Bipolar DO: Followed by psychiatrist at [facility].  On abilify, celexa and lamictal.  Postsurgical hypothyroidism: On synthroid. Lab Results  Component Value Date   TSH 0.58 08/05/2012     Review of Systems  Constitutional: Negative for fever and fatigue.  HENT: Negative for ear pain.   Eyes: Negative for pain.  Respiratory: Negative for chest tightness and shortness of breath.   Cardiovascular: Negative for chest pain, palpitations and leg swelling.  Gastrointestinal: Positive for abdominal pain and abdominal distention.  Genitourinary: Negative for dysuria.       Objective:   Physical Exam  Constitutional: Vital signs are normal. She appears well-developed and well-nourished. She is cooperative.  Non-toxic appearance. She does not appear ill. No distress.  HENT:  Head: Normocephalic.  Right Ear: Hearing, tympanic membrane,  external ear and ear canal normal. Tympanic membrane is not erythematous, not retracted and not bulging.  Left Ear: Hearing, tympanic membrane, external ear and ear canal normal. Tympanic membrane is not erythematous, not retracted and not bulging.  Nose: No mucosal edema or rhinorrhea. Right sinus exhibits no maxillary sinus tenderness and no frontal sinus tenderness. Left sinus exhibits no maxillary sinus tenderness and no frontal sinus tenderness.  Mouth/Throat: Uvula is midline, oropharynx is clear and moist and mucous membranes are normal.  Eyes: Conjunctivae, EOM and lids are normal. Pupils are equal, round, and reactive to light. Lids are everted and swept, no foreign bodies found.  Neck: Trachea normal and normal range of motion. Neck supple. Carotid bruit is not present. No mass and no thyromegaly present.  Cardiovascular: Normal rate, regular rhythm, S1 normal, S2 normal, normal heart sounds, intact distal pulses and normal pulses.  Exam reveals no gallop and no friction rub.   No murmur heard. Pulmonary/Chest: Effort normal and breath sounds normal. Not tachypneic. No respiratory distress. She has no decreased breath sounds. She has no wheezes. She has no rhonchi. She has no rales.  Abdominal: Soft. Normal appearance and bowel sounds are normal. She exhibits distension. There is tenderness in the right upper quadrant and epigastric area. There is guarding.  Neurological: She is alert.  Skin: Skin is warm, dry and intact. No rash noted.  Psychiatric: Her speech is normal and behavior is normal. Judgment and thought content normal. Her mood appears not anxious. Cognition and memory  are normal. She does not exhibit a depressed mood.          Assessment & Plan:    new pt ot me. Transfer of care. Symptoms likely all due to cirrhosis.. [place] and labs pending.  hematemesis/ blood in mouth.. Concerning for esophageal varices. Endo upcoming.   Will eval here for lipids DM screen. Schedule  wellness visit to up date prevention.  Discussed weight loss, healthy eating and ways to start exercise.

## 2013-07-21 ENCOUNTER — Ambulatory Visit (INDEPENDENT_AMBULATORY_CARE_PROVIDER_SITE_OTHER): Payer: Private Health Insurance - Indemnity | Admitting: Family Medicine

## 2013-07-21 ENCOUNTER — Encounter: Payer: Self-pay | Admitting: Family Medicine

## 2013-07-21 VITALS — BP 116/64 | HR 64 | Temp 97.7°F | Ht 63.5 in | Wt 162.2 lb

## 2013-07-21 DIAGNOSIS — Z1211 Encounter for screening for malignant neoplasm of colon: Secondary | ICD-10-CM

## 2013-07-21 DIAGNOSIS — E890 Postprocedural hypothyroidism: Secondary | ICD-10-CM

## 2013-07-21 DIAGNOSIS — Z0000 Encounter for general adult medical examination without abnormal findings: Secondary | ICD-10-CM

## 2013-07-21 LAB — TSH: TSH: 1.55 u[IU]/mL (ref 0.35–5.50)

## 2013-07-21 NOTE — Progress Notes (Signed)
Pre visit review using our clinic review tool, if applicable. No additional management support is needed unless otherwise documented below in the visit note. 

## 2013-07-21 NOTE — Progress Notes (Signed)
Subjective:    Patient ID: [name], female    DOB: 1955/08/10, 58 y.o.   MRN: [id number]  HPI  I have personally reviewed the Medicare Annual Wellness questionnaire and have noted 1. The patient's medical and social history 2. Their use of alcohol, tobacco or illicit drugs 3. Their current medications and supplements 4. The patient's functional ability including ADL's, fall risks, home safety risks and hearing or visual             impairment. 5. Diet and physical activities 6. Evidence for depression or mood disorders The patients weight, height, BMI and visual acuity have been recorded in the chart I have made referrals, counseling and provided education to the patient based review of the above and I have provided the pt with a written personalized care plan for preventive services.  Cholesterol at goal on no med. Lab Results  Component Value Date   CHOL 158 06/04/2013   HDL 62.50 06/04/2013   LDLCALC 80 06/04/2013   TRIG 79.0 06/04/2013   CHOLHDL 3 06/04/2013    Hypothyroid:  Due  Lab Results  Component Value Date   TSH 0.58 08/05/2012   Stable LFTs and platelets secondary to cirrhosis. Has upcoming GI appt for upper GI next week.. No further bleeding.  Review of Systems  Constitutional: Negative for fever and fatigue.  HENT: Negative for ear pain.   Eyes: Negative for pain.  Respiratory: Negative for chest tightness and shortness of breath.   Cardiovascular: Negative for chest pain, palpitations and leg swelling.  Gastrointestinal: Negative for abdominal pain.  Genitourinary: Negative for dysuria.       Objective:   Physical Exam  Constitutional: Vital signs are normal. She appears well-developed and well-nourished. She is cooperative.  Non-toxic appearance. She does not appear ill. No distress.  HENT:  Head: Normocephalic.  Right Ear: Hearing, tympanic membrane, external ear and ear canal normal.  Left Ear: Hearing, tympanic membrane, external ear and ear canal  normal.  Nose: Nose normal.  Eyes: Conjunctivae, EOM and lids are normal. Pupils are equal, round, and reactive to light. Lids are everted and swept, no foreign bodies found.  Neck: Trachea normal and normal range of motion. Neck supple. Carotid bruit is not present. No mass and no thyromegaly present.  Cardiovascular: Normal rate, regular rhythm, S1 normal, S2 normal, normal heart sounds and intact distal pulses.  Exam reveals no gallop.   No murmur heard. Pulmonary/Chest: Effort normal and breath sounds normal. No respiratory distress. She has no wheezes. She has no rhonchi. She has no rales.  Abdominal: Soft. Normal appearance and bowel sounds are normal. She exhibits no distension, no fluid wave, no abdominal bruit and no mass. There is no hepatosplenomegaly. There is no tenderness. There is no rebound, no guarding and no CVA tenderness. No hernia.  Genitourinary: No breast swelling, tenderness, discharge or bleeding.  Refused pelvic exam today   Lymphadenopathy:    She has no cervical adenopathy.    She has no axillary adenopathy.  Neurological: She is alert. She has normal strength. No cranial nerve deficit or sensory deficit.  Skin: Skin is warm, dry and intact. No rash noted.  Psychiatric: Her speech is normal and behavior is normal. Judgment normal. Her mood appears not anxious. Cognition and memory are normal. She does not exhibit a depressed mood.          Assessment & Plan:  The patient's preventative maintenance and recommended screening tests for an annual wellness exam were  reviewed in full today. Brought up to date unless services declined.  Counselled on the importance of diet, exercise, and its role in overall health and mortality. The patient's FH and SH was reviewed, including their home life, tobacco status, and drug and alcohol status.   Vaccines: Due for tdap, up to date with hep A abd B. She refuses today. Colon: neg ifob last year, continue yearly. Mammo:  04/2013 nml  PAP/DVE: last pap 3 years ago, due this year, but she refuses DEXA: no family history of osteoporosis known

## 2013-07-21 NOTE — Patient Instructions (Addendum)
Stop by lab to check TSH on way out today.  Stop at lab for stool test. Schedule appt for pap smear only when able.

## 2013-08-13 ENCOUNTER — Ambulatory Visit (INDEPENDENT_AMBULATORY_CARE_PROVIDER_SITE_OTHER): Payer: Medicare HMO | Admitting: Podiatrist

## 2013-08-13 ENCOUNTER — Encounter: Payer: Self-pay | Admitting: Podiatrist

## 2013-08-13 ENCOUNTER — Ambulatory Visit (INDEPENDENT_AMBULATORY_CARE_PROVIDER_SITE_OTHER): Payer: Medicare HMO

## 2013-08-13 VITALS — BP 121/67 | HR 73 | Resp 16 | Ht 64.0 in | Wt 170.0 lb

## 2013-08-13 DIAGNOSIS — M722 Plantar fascial fibromatosis: Secondary | ICD-10-CM

## 2013-08-13 DIAGNOSIS — Q828 Other specified congenital malformations of skin: Secondary | ICD-10-CM

## 2013-08-13 DIAGNOSIS — Q829 Congenital malformation of skin, unspecified: Secondary | ICD-10-CM

## 2013-08-13 MED ORDER — LIDOCAINE-PRILOCAINE 2.5-2.5 % EX CREA: 1.0000 "application " | TOPICAL_CREAM | CUTANEOUS | Status: DC | PRN

## 2013-08-13 NOTE — Progress Notes (Signed)
Subjective: [name] presents today for pain of bilateral feet. She has multiple deeply enucleated lesions sub-met 1, 5, lateral foot, left and right lateral foot. She also complains of plantar fasciitis-type symptomatology bilaterally. She states she had surgery in the past to try and elevate the first metatarsal and prevent the lesions from occurring. She relates this was unsuccessful and she continues to have painful lesions that occur regularly. She was seen in the past by Dr. Jiles Crocker at [facility] group and more recently was treated by Dr. Milinda Pointer. She has had debridement of lesions and treatment for plantar fasciitis in the past. Usually she applies EMLA cream to the lesions prior to her visit for debridement purposes. Today she did not have any to apply.  Objective:Patient is awake, alert, and oriented x 3.  In no acute distress.  Vascular status is intact with palpable pedal pulses at 2/4 DP and PT bilateral and capillary refill time within normal limits. Neurological sensation is also intact bilaterally via Semmes Weinstein monofilament at 5/5 sites. Light touch, vibratory sensation, Achilles tendon reflex is intact. Dermatological exam reveals multiple intractable hyperkeratotic lesions with a waxy-like appearance present. They are present submetatarsal 1, 3, 5 and lateral left foot as well as the right heel and right lateral foot they're painful and symptomatic. She also has mild discomfort along the insertion of the plantar fascia bilaterally. No pain along the posterior tibial tendon is noted bilateral. X-rays show a very minimal heel spur however no sign of stress fracture or abnormality is present.  Assessment: Plantar fasciitis, intractable hyperkeratotic lesions multiple in nature bilateral feet  Plan: Discussed an injection for the plantar fasciitis and she declined. Recommended debridement of the lesions however she is very touchy at today's visit and I don't feel I would be able to debride  the lesions to a level that would improve her pain. I recommended having her utilize the EMLA cream for one to 2 hours prior to her next visit and to allow me to debride them fully at that time. I will see to her more about plantar fasciitis at that time and possibly discuss injection therapy. Her shoes today or worn out and I recommended she get a new pair. Also recommended stretching and icing exercises as well. She has had orthotics in the past by Dr. Jiles Crocker and we will see if she continues to wear those now. She will be reappointed for next week for a thorough debridement.

## 2013-08-20 ENCOUNTER — Ambulatory Visit (INDEPENDENT_AMBULATORY_CARE_PROVIDER_SITE_OTHER): Payer: Medicare HMO | Admitting: Podiatrist

## 2013-08-20 VITALS — BP 112/70 | HR 72 | Resp 16

## 2013-08-20 DIAGNOSIS — Q828 Other specified congenital malformations of skin: Secondary | ICD-10-CM

## 2013-08-20 DIAGNOSIS — Q829 Congenital malformation of skin, unspecified: Secondary | ICD-10-CM

## 2013-08-20 NOTE — Progress Notes (Signed)
Subjective: [name] presents today for continued pain of bilateral feet. She has multiple deeply enucleated lesions sub-met 1, 5, lateral foot, left and right lateral foot and heel. She states she had surgery in the past to try and elevate the first metatarsal and prevent the lesions from occurring. She relates this was unsuccessful and she continues to have painful lesions that occur regularly. She was seen in the past by Dr. Jiles Crocker at [facility] group and more recently was treated by Dr. Milinda Pointer. She has had debridement of lesions and treatment for plantar fasciitis in the past. Today she applied EMLA cream to the lesions prior to her visit for debridement purposes.   Objective:Patient is awake, alert, and oriented x 3. In no acute distress. Vascular status is intact with palpable pedal pulses at 2/4 DP and PT bilateral and capillary refill time within normal limits. Neurological sensation is also intact bilaterally via Semmes Weinstein monofilament at 5/5 sites. Light touch, vibratory sensation, Achilles tendon reflex is intact. Dermatological exam reveals multiple intractable hyperkeratotic lesions with a waxy-like appearance present. They are present submetatarsal 1, 3, 5 and lateral left foot as well as the right heel and right lateral foot they're painful and symptomatic.   Assessment: , intractable hyperkeratotic lesions multiple in nature bilateral feet porokeratosis bilateral  Plan: She anesthetized her feet with EMLA cream prior to her visit. She was still tender when she got here to the office and therefore we added some more. I then debrided the lesions as deeply as she can tolerate with a chisel blade. I will see her back in 2 weeks for repeat treatment. Once we get the areas under control she'll then be seen back monthly. He will continue to apply EMLA cream prior to her visit.

## 2013-08-21 ENCOUNTER — Ambulatory Visit: Payer: Private Health Insurance - Indemnity | Admitting: Family Medicine

## 2013-08-28 ENCOUNTER — Other Ambulatory Visit (HOSPITAL_COMMUNITY)
Admission: RE | Admit: 2013-08-28 | Discharge: 2013-08-28 | Disposition: A | Discharge status: Home / Self Care | Payer: Private Health Insurance - Indemnity | Source: Ambulatory Visit | Attending: Family Medicine | Admitting: Family Medicine

## 2013-08-28 ENCOUNTER — Ambulatory Visit (INDEPENDENT_AMBULATORY_CARE_PROVIDER_SITE_OTHER): Payer: Private Health Insurance - Indemnity | Admitting: Family Medicine

## 2013-08-28 ENCOUNTER — Encounter: Payer: Self-pay | Admitting: Family Medicine

## 2013-08-28 VITALS — BP 112/60 | HR 67 | Temp 97.8°F | Ht 63.5 in | Wt 161.5 lb

## 2013-08-28 DIAGNOSIS — Z124 Encounter for screening for malignant neoplasm of cervix: Secondary | ICD-10-CM

## 2013-08-28 DIAGNOSIS — Z01419 Encounter for gynecological examination (general) (routine) without abnormal findings: Secondary | ICD-10-CM | POA: Insufficient documentation

## 2013-08-28 DIAGNOSIS — Z1151 Encounter for screening for human papillomavirus (HPV): Secondary | ICD-10-CM | POA: Insufficient documentation

## 2013-08-28 NOTE — Patient Instructions (Signed)
We will obtain records from [facility] to look into history further. Follow up for cpx next April.

## 2013-08-28 NOTE — Progress Notes (Signed)
Pre visit review using our clinic review tool, if applicable. No additional management support is needed unless otherwise documented below in the visit note. 

## 2013-08-28 NOTE — Progress Notes (Signed)
   Subjective:    Patient ID: [name], female    DOB: 1955-08-01, 58 y.o.   MRN: [id number]  Gynecologic Exam The patient's pertinent negatives include no vaginal discharge. Pertinent negatives include no fever.    58 year old female with complete physical in 06/2013 returns for pap smear pelvic exam only. At last OV, she refused, but is now agreeable.  No vaginal itching, discharge. Postmenopausal.    She notes she had past episode of syncope.. ? Cause,? Work up,? She is uncertain and thinks they told her she had mini stroke or mini heart attack. NO cardiac or imaging work up per pt. Not on aspirin or anything else. Will try to get records from [facility].  Review of Systems  Constitutional: Negative for fever and fatigue.  HENT: Negative for ear pain.   Eyes: Negative for pain.  Respiratory: Negative for cough.   Cardiovascular: Negative for chest pain.  Genitourinary: Negative for vaginal bleeding, vaginal discharge and vaginal pain.       Objective:   Physical Exam  Genitourinary: Rectum normal, vagina normal and uterus normal. Guaiac negative stool. Pelvic exam was performed with patient supine. There is no rash, tenderness, lesion or injury on the right labia. There is no rash, tenderness, lesion or injury on the left labia. Cervix exhibits no motion tenderness, no discharge and no friability. Right adnexum displays no mass, no tenderness and no fullness. Left adnexum displays no mass, no tenderness and no fullness. No erythema, tenderness or bleeding around the vagina. No foreign body around the vagina. No signs of injury around the vagina. No vaginal discharge found.          Assessment & Plan:

## 2013-08-31 LAB — CYTOLOGY - PAP

## 2013-09-03 ENCOUNTER — Ambulatory Visit: Payer: Medicare HMO | Admitting: Podiatrist

## 2013-09-03 ENCOUNTER — Encounter: Payer: Self-pay | Admitting: *Deleted

## 2013-09-03 VITALS — BP 123/66 | HR 76 | Resp 16

## 2013-09-03 DIAGNOSIS — M79609 Pain in unspecified limb: Secondary | ICD-10-CM

## 2013-09-03 DIAGNOSIS — Q828 Other specified congenital malformations of skin: Secondary | ICD-10-CM

## 2013-09-03 DIAGNOSIS — M216X9 Other acquired deformities of unspecified foot: Secondary | ICD-10-CM

## 2013-09-03 NOTE — Progress Notes (Signed)
Subjective: Abel presents today for continued pain of bilateral feet. She has multiple deeply enucleated lesions sub-met 1, 5, lateral foot, left and right lateral foot and heel. She states she had surgery in the past to try and elevate the first metatarsal and prevent the lesions from occurring. She relates this was unsuccessful and she continues to have painful lesions that occur regularly. She was seen in the past by Dr. Jiles Crocker at [facility] group and more recently was treated by Dr. Milinda Pointer. She has had debridement of lesions and treatment for plantar fasciitis in the past. Today she applied EMLA cream to the lesions prior to her visit for debridement purposes.  Objective:Patient is awake, alert, and oriented x 3. In no acute distress. Vascular status is intact with palpable pedal pulses at 2/4 DP and PT bilateral and capillary refill time within normal limits. Neurological sensation is also intact bilaterally via Semmes Weinstein monofilament at 5/5 sites. Light touch, vibratory sensation, Achilles tendon reflex is intact. Dermatological exam reveals multiple intractable hyperkeratotic lesions with a waxy-like appearance present. They are present submetatarsal 1, 3, 5 and lateral left foot as well as the right heel and right lateral foot they're painful and symptomatic.  Assessment: , intractable hyperkeratotic lesions multiple in nature bilateral feet porokeratosis bilateral  Plan: She anesthetized her feet with EMLA cream prior to her visit. I then debrided the lesions as deeply as she can tolerate with a chisel blade. I will see her back in 2 weeks for repeat treatment. Once we get the areas under control she'll then be seen back monthly. she will continue to apply EMLA cream prior to her visit.

## 2013-09-07 ENCOUNTER — Ambulatory Visit (INDEPENDENT_AMBULATORY_CARE_PROVIDER_SITE_OTHER): Payer: Private Health Insurance - Indemnity | Admitting: Internal Medicine

## 2013-09-07 ENCOUNTER — Encounter: Payer: Self-pay | Admitting: Internal Medicine

## 2013-09-07 VITALS — BP 110/70 | HR 71 | Temp 98.2°F | Wt 162.0 lb

## 2013-09-07 DIAGNOSIS — M5431 Sciatica, right side: Secondary | ICD-10-CM

## 2013-09-07 DIAGNOSIS — M5430 Sciatica, unspecified side: Secondary | ICD-10-CM

## 2013-09-07 MED ORDER — PREDNISONE 10 MG PO TABS: ORAL_TABLET | ORAL | Status: DC

## 2013-09-07 NOTE — Progress Notes (Signed)
Pre visit review using our clinic review tool, if applicable. No additional management support is needed unless otherwise documented below in the visit note. 

## 2013-09-07 NOTE — Progress Notes (Signed)
Subjective:    Patient ID: [name], female    DOB: Sep 02, 1955, 58 y.o.   MRN: [id number]  HPI  Pt presents to the clinic today with c/o back pain. This started 2 days ago. The pain starts in the middle/right side of her back and radiates down to her right knee. She denies any specific injury to the area but reports she has been sleeping on her couch instead of her bed for the last 2 weeks. It does seem worse with movement. She rates the pain as 9/10. She has tried aleve, ibuprofen and soaking in a tub with epsom salt.  Review of Systems      Past Medical History  Diagnosis Date  . Migraines   . Acquired hypothyroidism     thyroid goiter s/p thyroidectomy  . Bipolar 1 disorder 1990s  . Insomnia   . Varicose veins   . GERD (gastroesophageal reflux disease)     Hpylori + and treated (2012)  . Depression     states seen weekly at [facility]  . Complication of anesthesia     states 15 yrs ago, hard time waking up, states cant breathe when coming out of anesthesia  . Chronic hepatitis C     genotype 1a  . Compensated HCV cirrhosis   . Smoker   . History of alcohol abuse   . Portal hypertensive gastropathy 2013  . Portal hypertension     Current Outpatient Prescriptions  Medication Sig Dispense Refill  . ARIPiprazole (ABILIFY) 10 MG tablet Take 1 tablet (10 mg total) by mouth daily.  30 tablet  0  . citalopram (CELEXA) 40 MG tablet TAKE 1 TABLET BY MOUTH DAILY  30 tablet  0  . lamoTRIgine (LAMICTAL) 200 MG tablet Take 1 tablet (200 mg total) by mouth daily.  30 tablet  3  . lidocaine-prilocaine (EMLA) cream Apply 1 application topically as needed.  30 g  5  . SYNTHROID 88 MCG tablet TAKE 1 TABLET BY MOUTH DAILY  30 tablet  3   No current facility-administered medications for this visit.    Allergies  Allergen Reactions  . Demerol Anaphylaxis  . Penicillins Anaphylaxis, Hives and Itching  . Aspirin Nausea And Vomiting    Family History  Problem Relation  Age of Onset  . Heart disease Mother     pacemaker  . Cancer Father     bladder  . Diabetes Father   . Hypertension Father   . Cancer Father     melanoma    History   Social History  . Marital Status: Legally Separated    Spouse Name: N/A    Number of Children: N/A  . Years of Education: N/A   Occupational History  . Not on file.   Social History Main Topics  . Smoking status: Current Every Day Smoker -- 0.50 packs/day for 40 years    Types: Cigarettes  . Smokeless tobacco: Former Systems developer     Comment: decreasing use 05/30/2011  . Alcohol Use: No  . Drug Use: No     Comment: cocaine, lsd, marijuana, heroin  in 20's  . Sexual Activity: Not on file   Other Topics Concern  . Not on file   Social History Narrative   Lives alone.  Divorced.  No children   Occupation: disability for bipolar   Activity: no regular exercise   Diet: some water, fruits/vegetables occasional     Constitutional: Denies fever, malaise, fatigue, headache or abrupt weight changes.  Musculoskeletal: Pt reports low back pain. Denies decrease in    No other specific complaints in a complete review of systems (except as listed in HPI above).  Objective:   Physical Exam   Wt 162 lb (73.483 kg) Wt Readings from Last 3 Encounters:  09/07/13 162 lb (73.483 kg)  08/28/13 161 lb 8 oz (73.256 kg)  08/13/13 170 lb (77.111 kg)    General: Appears her stated age, well developed, well nourished in NAD. Cardiovascular: Normal rate and rhythm. S1,S2 noted.  No murmur, rubs or gallops noted. No JVD or BLE edema. No carotid bruits noted. Pulmonary/Chest: Normal effort and positive vesicular breath sounds. No respiratory distress. No wheezes, rales or ronchi noted.  Musculoskeletal: Ataxic gait. Pain with palpation of the lower lumbar spine. Difficulty with flexion and extension of the back secondary to pain. Positive straight leg raise.   BMET    Component Value Date/Time   NA 140 06/04/2013 1047   NA 142  09/26/2011   K 4.0 06/04/2013 1047   CL 108 06/04/2013 1047   CO2 26 06/04/2013 1047   GLUCOSE 88 06/04/2013 1047   BUN 12 06/04/2013 1047   BUN 11 09/26/2011   CREATININE 0.7 06/04/2013 1047   CREATININE 0.67 09/26/2011   CALCIUM 8.4 06/04/2013 1047   GFRNONAA 81* 05/19/2011 0500   GFRAA >90 05/19/2011 0500    Lipid Panel     Component Value Date/Time   CHOL 158 06/04/2013 1047   TRIG 79.0 06/04/2013 1047   HDL 62.50 06/04/2013 1047   CHOLHDL 3 06/04/2013 1047   VLDL 15.8 06/04/2013 1047   LDLCALC 80 06/04/2013 1047    CBC    Component Value Date/Time   WBC 5.0 06/04/2013 1047   RBC 4.67 06/04/2013 1047   HGB 14.9 06/04/2013 1047   HCT 44.0 06/04/2013 1047   PLT 71.0* 06/04/2013 1047   MCV 94.2 06/04/2013 1047   MCH 31.4 05/14/2011 0930   MCHC 33.9 06/04/2013 1047   RDW 13.8 06/04/2013 1047   LYMPHSABS 1.3 06/04/2013 1047   MONOABS 0.8 06/04/2013 1047   EOSABS 0.1 06/04/2013 1047   BASOSABS 0.0 06/04/2013 1047    Hgb A1C No results found for this basename: HGBA1C        Assessment & Plan:   Sciatica right side:  eRx for pred taper Stretching exercises given   RTC as needed or if symptoms persist or worsen

## 2013-09-07 NOTE — Patient Instructions (Addendum)
Back Exercises These exercises may help you when beginning to rehabilitate your injury. Your symptoms may resolve with or without further involvement from your physician, physical therapist or athletic trainer. While completing these exercises, remember:   Restoring tissue flexibility helps normal motion to return to the joints. This allows healthier, less painful movement and activity.  An effective stretch should be held for at least 30 seconds.  A stretch should never be painful. You should only feel a gentle lengthening or release in the stretched tissue. STRETCH  Extension, Prone on Elbows   Lie on your stomach on the floor, a bed will be too soft. Place your palms about shoulder width apart and at the height of your head.  Place your elbows under your shoulders. If this is too painful, stack pillows under your chest.  Allow your body to relax so that your hips drop lower and make contact more completely with the floor.  Hold this position for __________ seconds.  Slowly return to lying flat on the floor. Repeat __________ times. Complete this exercise __________ times per day.  RANGE OF MOTION  Extension, Prone Press Ups   Lie on your stomach on the floor, a bed will be too soft. Place your palms about shoulder width apart and at the height of your head.  Keeping your back as relaxed as possible, slowly straighten your elbows while keeping your hips on the floor. You may adjust the placement of your hands to maximize your comfort. As you gain motion, your hands will come more underneath your shoulders.  Hold this position __________ seconds.  Slowly return to lying flat on the floor. Repeat __________ times. Complete this exercise __________ times per day.  RANGE OF MOTION- Quadruped, Neutral Spine   Assume a hands and knees position on a firm surface. Keep your hands under your shoulders and your knees under your hips. You may place padding under your knees for comfort.  Drop  your head and point your tail bone toward the ground below you. This will round out your low back like an angry cat. Hold this position for __________ seconds.  Slowly lift your head and release your tail bone so that your back sags into a large arch, like an old horse.  Hold this position for __________ seconds.  Repeat this until you feel limber in your low back.  Now, find your "sweet spot." This will be the most comfortable position somewhere between the two previous positions. This is your neutral spine. Once you have found this position, tense your stomach muscles to support your low back.  Hold this position for __________ seconds. Repeat __________ times. Complete this exercise __________ times per day.  STRETCH  Flexion, Single Knee to Chest   Lie on a firm bed or floor with both legs extended in front of you.  Keeping one leg in contact with the floor, bring your opposite knee to your chest. Hold your leg in place by either grabbing behind your thigh or at your knee.  Pull until you feel a gentle stretch in your low back. Hold __________ seconds.  Slowly release your grasp and repeat the exercise with the opposite side. Repeat __________ times. Complete this exercise __________ times per day.  STRETCH - Hamstrings, Standing  Stand or sit and extend your right / left leg, placing your foot on a chair or foot stool  Keeping a slight arch in your low back and your hips straight forward.  Lead with your chest and   lean forward at the waist until you feel a gentle stretch in the back of your right / left knee or thigh. (When done correctly, this exercise requires leaning only a small distance.)  Hold this position for __________ seconds. Repeat __________ times. Complete this stretch __________ times per day. STRENGTHENING  Deep Abdominals, Pelvic Tilt   Lie on a firm bed or floor. Keeping your legs in front of you, bend your knees so they are both pointed toward the ceiling and  your feet are flat on the floor.  Tense your lower abdominal muscles to press your low back into the floor. This motion will rotate your pelvis so that your tail bone is scooping upwards rather than pointing at your feet or into the floor.  With a gentle tension and even breathing, hold this position for __________ seconds. Repeat __________ times. Complete this exercise __________ times per day.  STRENGTHENING  Abdominals, Crunches   Lie on a firm bed or floor. Keeping your legs in front of you, bend your knees so they are both pointed toward the ceiling and your feet are flat on the floor. Cross your arms over your chest.  Slightly tip your chin down without bending your neck.  Tense your abdominals and slowly lift your trunk high enough to just clear your shoulder blades. Lifting higher can put excessive stress on the low back and does not further strengthen your abdominal muscles.  Control your return to the starting position. Repeat __________ times. Complete this exercise __________ times per day.  STRENGTHENING  Quadruped, Opposite UE/LE Lift   Assume a hands and knees position on a firm surface. Keep your hands under your shoulders and your knees under your hips. You may place padding under your knees for comfort.  Find your neutral spine and gently tense your abdominal muscles so that you can maintain this position. Your shoulders and hips should form a rectangle that is parallel with the floor and is not twisted.  Keeping your trunk steady, lift your right hand no higher than your shoulder and then your left leg no higher than your hip. Make sure you are not holding your breath. Hold this position __________ seconds.  Continuing to keep your abdominal muscles tense and your back steady, slowly return to your starting position. Repeat with the opposite arm and leg. Repeat __________ times. Complete this exercise __________ times per day. Document Released: 03/30/2005 Document  Revised: 06/04/2011 Document Reviewed: 06/24/2008 [employer] Patient Information 2014 [employer].  

## 2013-09-08 ENCOUNTER — Telehealth: Payer: Self-pay | Admitting: Family Medicine

## 2013-09-08 NOTE — Telephone Encounter (Signed)
Relevant patient education mailed to patient.  

## 2013-09-10 ENCOUNTER — Ambulatory Visit: Payer: Medicare HMO | Admitting: Podiatrist

## 2013-09-17 ENCOUNTER — Ambulatory Visit (INDEPENDENT_AMBULATORY_CARE_PROVIDER_SITE_OTHER): Payer: Medicare HMO | Admitting: Podiatrist

## 2013-09-17 VITALS — BP 133/66 | HR 74 | Resp 16

## 2013-09-17 DIAGNOSIS — Q829 Congenital malformation of skin, unspecified: Secondary | ICD-10-CM

## 2013-09-17 DIAGNOSIS — M216X9 Other acquired deformities of unspecified foot: Secondary | ICD-10-CM

## 2013-09-17 DIAGNOSIS — Q828 Other specified congenital malformations of skin: Secondary | ICD-10-CM

## 2013-09-17 NOTE — Progress Notes (Signed)
Subjective: [name] presents today for continued pain of bilateral feet. She has multiple deeply enucleated lesions sub-met 1, 5, lateral foot, left and right lateral foot and heel. She states she had surgery in the past to try and elevate the first metatarsal and prevent the lesions from occurring. She relates this was unsuccessful and she continues to have painful lesions that occur regularly. We have been debriding the lesions every 2 weeks and she has noticed improvement. Today she applied EMLA cream to the lesions prior to her visit for debridement purposes.    Objective:Patient is awake, alert, and oriented x 3. In no acute distress. Vascular status is intact with palpable pedal pulses at 2/4 DP and PT bilateral and capillary refill time within normal limits. Neurological sensation is also intact bilaterally via Semmes Weinstein monofilament at 5/5 sites. Light touch, vibratory sensation, Achilles tendon reflex is intact. Dermatological exam reveals multiple intractable hyperkeratotic lesions with a waxy-like appearance present. They are present submetatarsal 1, 4 and lateral left foot as well as the  right lateral foot they're painful and symptomatic.   Assessment: , intractable hyperkeratotic lesions multiple in nature bilateral feet porokeratosis bilateral   Plan: She anesthetized her feet with EMLA cream prior to her visit. I then debrided the lesions as deeply as she can tolerate with a chisel blade. I will see her back in 3 weeks for repeat treatment. May anesthetize the lesion near the digit on the left foot as this is the most symptomatic and painful for her. If we anesthetize the third debridement can be performed. She'll consider it for the next visit. she will continue to apply EMLA cream prior to her visit.

## 2013-09-18 ENCOUNTER — Other Ambulatory Visit: Payer: Self-pay | Admitting: Family Medicine

## 2013-10-08 ENCOUNTER — Encounter: Payer: Self-pay | Admitting: Podiatrist

## 2013-10-08 ENCOUNTER — Ambulatory Visit (INDEPENDENT_AMBULATORY_CARE_PROVIDER_SITE_OTHER): Payer: Medicare HMO | Admitting: Podiatrist

## 2013-10-08 VITALS — BP 137/79 | HR 60 | Resp 12

## 2013-10-08 DIAGNOSIS — M216X9 Other acquired deformities of unspecified foot: Secondary | ICD-10-CM

## 2013-10-08 DIAGNOSIS — Q828 Other specified congenital malformations of skin: Secondary | ICD-10-CM

## 2013-10-08 DIAGNOSIS — Q829 Congenital malformation of skin, unspecified: Secondary | ICD-10-CM

## 2013-10-08 NOTE — Progress Notes (Signed)
Subjective: [name] presents today for continued pain of bilateral feet. She has multiple deeply enucleated lesions sub-met 1, 5, lateral foot, left and right lateral foot and heel. She states she had surgery in the past to try and elevate the first metatarsal and prevent the lesions from occurring. She relates this was unsuccessful and she continues to have painful lesions that occur regularly. We have been debriding the lesions every 2 weeks and she has noticed improvement. Today she applied EMLA cream to the lesions prior to her visit for debridement purposes. She states the right foot is not painful and does not require debridement today.  Objective:Patient is awake, alert, and oriented x 3. In no acute distress. Vascular status is intact with palpable pedal pulses at 2/4 DP and PT bilateral and capillary refill time within normal limits. Neurological sensation is also intact bilaterally via Semmes Weinstein monofilament at 5/5 sites. Light touch, vibratory sensation, Achilles tendon reflex is intact. Dermatological exam reveals multiple intractable hyperkeratotic lesions with a waxy-like appearance present. They are present submetatarsal 1, 4 and lateral left foot as well as the right lateral foot they're painful and symptomatic.   Assessment: , intractable hyperkeratotic lesions multiple in nature bilateral feet porokeratosis bilateral   Plan: She anesthetized her feet with EMLA cream prior to her visit. I then debrided the lesions as deeply as she can tolerate with a chisel blade. I will see her back in 3 weeks for repeat treatment. she will continue to apply EMLA cream prior to her visit

## 2013-10-13 ENCOUNTER — Ambulatory Visit (INDEPENDENT_AMBULATORY_CARE_PROVIDER_SITE_OTHER): Payer: Private Health Insurance - Indemnity | Admitting: Internal Medicine

## 2013-10-13 ENCOUNTER — Encounter: Payer: Self-pay | Admitting: Internal Medicine

## 2013-10-13 VITALS — BP 116/72 | HR 78 | Temp 97.9°F | Wt 161.0 lb

## 2013-10-13 DIAGNOSIS — R011 Cardiac murmur, unspecified: Secondary | ICD-10-CM

## 2013-10-13 DIAGNOSIS — R143 Flatulence: Secondary | ICD-10-CM

## 2013-10-13 DIAGNOSIS — R140 Abdominal distension (gaseous): Secondary | ICD-10-CM

## 2013-10-13 DIAGNOSIS — K7460 Unspecified cirrhosis of liver: Secondary | ICD-10-CM

## 2013-10-13 DIAGNOSIS — K7469 Other cirrhosis of liver: Secondary | ICD-10-CM

## 2013-10-13 DIAGNOSIS — R609 Edema, unspecified: Secondary | ICD-10-CM

## 2013-10-13 DIAGNOSIS — R142 Eructation: Secondary | ICD-10-CM

## 2013-10-13 DIAGNOSIS — R141 Gas pain: Secondary | ICD-10-CM

## 2013-10-13 LAB — COMPREHENSIVE METABOLIC PANEL
ALK PHOS: 64 U/L (ref 39–117)
ALT: 64 U/L — AB (ref 0–35)
AST: 93 U/L — AB (ref 0–37)
Albumin: 3.3 g/dL — ABNORMAL LOW (ref 3.5–5.2)
BILIRUBIN TOTAL: 1 mg/dL (ref 0.2–1.2)
BUN: 10 mg/dL (ref 6–23)
CO2: 28 meq/L (ref 19–32)
Calcium: 8.7 mg/dL (ref 8.4–10.5)
Chloride: 106 mEq/L (ref 96–112)
Creatinine, Ser: 0.7 mg/dL (ref 0.4–1.2)
GFR: 97.53 mL/min (ref >60.00)
Glucose, Bld: 88 mg/dL (ref 70–99)
Potassium: 4.2 mEq/L (ref 3.5–5.1)
SODIUM: 139 meq/L (ref 135–145)
TOTAL PROTEIN: 7 g/dL (ref 6.0–8.3)

## 2013-10-13 NOTE — Patient Instructions (Addendum)
Bloating Bloating is the feeling of fullness in your belly. You may feel as though your pants are too tight. Often the cause of bloating is overeating, retaining fluids, or having gas in your bowel. It is also caused by swallowing air and eating foods that cause gas. Irritable bowel syndrome is one of the most common causes of bloating. Constipation is also a common cause. Sometimes more serious problems can cause bloating. SYMPTOMS  Usually there is a feeling of fullness, as though your abdomen is bulged out. There may be mild discomfort.  DIAGNOSIS  Usually no particular testing is necessary for most bloating. If the condition persists and seems to become worse, your caregiver may do additional testing.  TREATMENT   There is no direct treatment for bloating.  Do not put gas into the bowel. Avoid chewing gum and sucking on candy. These tend to make you swallow air. Swallowing air can also be a nervous habit. Try to avoid this.  Avoiding high residue diets will help. Eat foods with soluble fibers (examples include root vegetables, apples, or barley) and substitute dairy products with soy and rice products. This helps irritable bowel syndrome.  If constipation is the cause, then a high residue diet with more fiber will help.  Avoid carbonated beverages.  Over-the-counter preparations are available that help reduce gas. Your pharmacist can help you with this. SEEK MEDICAL CARE IF:   Bloating continues and seems to be getting worse.  You notice a weight gain.  You have a weight loss but the bloating is getting worse.  You have changes in your bowel habits or develop nausea or vomiting. SEEK IMMEDIATE MEDICAL CARE IF:   You develop shortness of breath or swelling in your legs.  You have an increase in abdominal pain or develop chest pain. Document Released: 01/10/2006 Document Revised: 06/04/2011 Document Reviewed: 02/28/2007 Exit[employer] Patient Information 2015 [employer]. This  information is not intended to replace advice given to you by your health care provider. Make sure you discuss any questions you have with your health care provider.  

## 2013-10-13 NOTE — Progress Notes (Signed)
Pre visit review using our clinic review tool, if applicable. No additional management support is needed unless otherwise documented below in the visit note. 

## 2013-10-13 NOTE — Progress Notes (Signed)
Subjective:    Patient ID: [name], female    DOB: [birth year], 58 y.o.   MRN: [id number]  HPI  Pt presents to the clinic today with c/o swelling of bilateral legs. She reports this started 2 weeks ago. She has not noticed any pain in her legs but they do feel tight. She also reports facial swelling and bloating in her abdomen. She has not had any weight gain or loss. She has not started any new medications. She denies difficulty breathing or shortness of breath. She does have a history of cirrhosis. She does report feeling constipated. She did try 2 fleet and ex lax without good results. She reports her stool is hard and usually formed into little balls. She denies blood in her stool, nausea or vomiting.   Review of Systems      Past Medical History  Diagnosis Date  . Migraines   . Acquired hypothyroidism     thyroid goiter s/p thyroidectomy  . Bipolar 1 disorder 1990s  . Insomnia   . Varicose veins   . GERD (gastroesophageal reflux disease)     Hpylori + and treated (2012)  . Depression     states seen weekly at [facility]  . Complication of anesthesia     states 15 yrs ago, hard time waking up, states cant breathe when coming out of anesthesia  . Chronic hepatitis C     genotype 1a  . Compensated HCV cirrhosis   . Smoker   . History of alcohol abuse   . Portal hypertensive gastropathy 2013  . Portal hypertension     Current Outpatient Prescriptions  Medication Sig Dispense Refill  . ARIPiprazole (ABILIFY) 10 MG tablet Take 1 tablet (10 mg total) by mouth daily.  30 tablet  0  . citalopram (CELEXA) 40 MG tablet TAKE 1 TABLET BY MOUTH DAILY  30 tablet  0  . hydrOXYzine (VISTARIL) 25 MG capsule       . lamoTRIgine (LAMICTAL) 200 MG tablet Take 1 tablet (200 mg total) by mouth daily.  30 tablet  3  . lidocaine-prilocaine (EMLA) cream Apply 1 application topically as needed.  30 g  5  . SYNTHROID 88 MCG tablet TAKE 1 TABLET BY MOUTH DAILY  30 tablet  3   No  current facility-administered medications for this visit.    Allergies  Allergen Reactions  . Demerol Anaphylaxis  . Penicillins Anaphylaxis, Hives and Itching  . Aspirin Nausea And Vomiting    Family History  Problem Relation Age of Onset  . Heart disease Mother     pacemaker  . Cancer Father     bladder  . Diabetes Father   . Hypertension Father   . Cancer Father     melanoma    History   Social History  . Marital Status: Legally Separated    Spouse Name: N/A    Number of Children: N/A  . Years of Education: N/A   Occupational History  . Not on file.   Social History Main Topics  . Smoking status: Current Every Day Smoker -- 0.50 packs/day for 40 years    Types: Cigarettes  . Smokeless tobacco: Former Systems developer     Comment: decreasing use 05/30/2011  . Alcohol Use: No  . Drug Use: No     Comment: cocaine, lsd, marijuana, heroin  in 20's  . Sexual Activity: Not on file   Other Topics Concern  . Not on file   Social History Narrative  Lives alone.  Divorced.  No children   Occupation: disability for bipolar   Activity: no regular exercise   Diet: some water, fruits/vegetables occasional     Constitutional: Denies fever, malaise, fatigue, headache or abrupt weight changes.  HEENT: Pt reports swelling of her face. Denies eye pain, eye redness, ear pain, ringing in the ears, wax buildup, runny nose, nasal congestion, bloody nose, or sore throat. Respiratory: Denies difficulty breathing, shortness of breath, cough or sputum production.   Cardiovascular: Pt reports swelling of legs. Denies chest pain, chest tightness, palpitations or swelling in the hands.  Gastrointestinal: Pt reports bloating. Denies abdominal pain, constipation, diarrhea or blood in the stool.   No other specific complaints in a complete review of systems (except as listed in HPI above).  Objective:   Physical Exam  BP 116/72  Pulse 78  Temp(Src) 97.9 F (36.6 C) (Oral)  Wt 161 lb (73.029  kg)  SpO2 96% Wt Readings from Last 3 Encounters:  10/13/13 161 lb (73.029 kg)  09/07/13 162 lb (73.483 kg)  08/28/13 161 lb 8 oz (73.256 kg)    General: Appears her stated age, ill appearing in NAD. Skin: Warm, dry and intact. Jaundice HEENT: Head: normal shape and size, does not appear swollen to me.  Cardiovascular: Normal rate and rhythm. S1,S2 noted. Murmur noted at LSB.  No JVD. Trace BLE edema. No carotid bruits noted. Pulmonary/Chest: Normal effort and positive vesicular breath sounds. No respiratory distress. No wheezes, rales or ronchi noted.  Abdomen: Soft and generally tender. Slightly distended. Normal bowel sounds, no bruits noted. No r masses noted. Liver, spleen and kidneys non palpable.  BMET    Component Value Date/Time   NA 140 06/04/2013 1047   NA 142 09/26/2011   K 4.0 06/04/2013 1047   CL 108 06/04/2013 1047   CO2 26 06/04/2013 1047   GLUCOSE 88 06/04/2013 1047   BUN 12 06/04/2013 1047   BUN 11 09/26/2011   CREATININE 0.7 06/04/2013 1047   CREATININE 0.67 09/26/2011   CALCIUM 8.4 06/04/2013 1047   GFRNONAA 81* 05/19/2011 0500   GFRAA >90 05/19/2011 0500    Lipid Panel     Component Value Date/Time   CHOL 158 06/04/2013 1047   TRIG 79.0 06/04/2013 1047   HDL 62.50 06/04/2013 1047   CHOLHDL 3 06/04/2013 1047   VLDL 15.8 06/04/2013 1047   LDLCALC 80 06/04/2013 1047    CBC    Component Value Date/Time   WBC 5.0 06/04/2013 1047   RBC 4.67 06/04/2013 1047   HGB 14.9 06/04/2013 1047   HCT 44.0 06/04/2013 1047   PLT 71.0* 06/04/2013 1047   MCV 94.2 06/04/2013 1047   MCH 31.4 05/14/2011 0930   MCHC 33.9 06/04/2013 1047   RDW 13.8 06/04/2013 1047   LYMPHSABS 1.3 06/04/2013 1047   MONOABS 0.8 06/04/2013 1047   EOSABS 0.1 06/04/2013 1047   BASOSABS 0.0 06/04/2013 1047    Hgb A1C No results found for this basename: HGBA1C       Assessment & Plan:   Abdominal bloating:  I think this is r/t constipation instead of cirrhosis Will repeat CMET anyway Advised her to start  Mirilax OTC daily Drink plenty of fluids  Will follow up with you after test results  Follow up with PCP later this week regarding murmur

## 2013-10-16 ENCOUNTER — Telehealth: Payer: Self-pay | Admitting: Internal Medicine

## 2013-10-16 NOTE — Telephone Encounter (Signed)
Pt returned your call late afternoon yesterday. Informed pt of using Mirilax and if not feeling better schedule appt w/ PCP. Pt made appt for 8/7 with Dr. Diona Browner just incase.

## 2013-10-29 ENCOUNTER — Ambulatory Visit: Payer: Medicare HMO | Admitting: Podiatrist

## 2013-10-30 ENCOUNTER — Ambulatory Visit (INDEPENDENT_AMBULATORY_CARE_PROVIDER_SITE_OTHER): Payer: Private Health Insurance - Indemnity | Admitting: Family Medicine

## 2013-10-30 ENCOUNTER — Encounter: Payer: Self-pay | Admitting: Family Medicine

## 2013-10-30 VITALS — BP 100/60 | HR 59 | Temp 97.6°F | Ht 63.5 in | Wt 163.0 lb

## 2013-10-30 DIAGNOSIS — R609 Edema, unspecified: Secondary | ICD-10-CM | POA: Insufficient documentation

## 2013-10-30 DIAGNOSIS — R600 Localized edema: Secondary | ICD-10-CM | POA: Insufficient documentation

## 2013-10-30 DIAGNOSIS — R0989 Other specified symptoms and signs involving the circulatory and respiratory systems: Secondary | ICD-10-CM

## 2013-10-30 DIAGNOSIS — R5381 Other malaise: Secondary | ICD-10-CM | POA: Insufficient documentation

## 2013-10-30 DIAGNOSIS — R5383 Other fatigue: Secondary | ICD-10-CM

## 2013-10-30 DIAGNOSIS — R0609 Other forms of dyspnea: Secondary | ICD-10-CM | POA: Insufficient documentation

## 2013-10-30 DIAGNOSIS — R0600 Dyspnea, unspecified: Secondary | ICD-10-CM | POA: Insufficient documentation

## 2013-10-30 DIAGNOSIS — R011 Cardiac murmur, unspecified: Secondary | ICD-10-CM | POA: Insufficient documentation

## 2013-10-30 NOTE — Progress Notes (Signed)
Pre visit review using our clinic review tool, if applicable. No additional management support is needed unless otherwise documented below in the visit note. 

## 2013-10-30 NOTE — Progress Notes (Signed)
Subjective:    Patient ID: [name], female    DOB: Mar 05, 1956, 58 y.o.   MRN: [id number]  HPI  58 year old female with history of chronic hep C, cirrhosis presents for follow up on constipation.  She saw Cecille Po on 10/13/2013 for swelling in  Legs, abdominal bloating and constipation.  At that appt  She had CMET repeated, stable results. She was started on miralax and fluids.  A ? new murmur at LUSB was also noted on exam.  Today she reports she still has intermittent swelling in ankles later in the day.  She has better controlled constipation with miralax. BMs occuring 2 times a week. This is baseline for her in last 20 years.  No exertion chest pain.  occ DOE. She has felt increase in fatigue in last month. She is current smoker. Well controlled chol. Family history: mother with pacemaker, afib, brady  Review of Systems  Constitutional: Positive for fatigue. Negative for fever.  HENT: Negative for ear pain.   Eyes: Negative for pain.  Respiratory: Positive for shortness of breath. Negative for chest tightness.   Cardiovascular: Positive for leg swelling. Negative for chest pain and palpitations.  Gastrointestinal: Negative for abdominal pain.  Genitourinary: Negative for dysuria.       Objective:   Physical Exam  Constitutional: Vital signs are normal. She appears well-developed and well-nourished. She is cooperative.  Non-toxic appearance. She does not appear ill. No distress.  HENT:  Head: Normocephalic.  Right Ear: Hearing, tympanic membrane, external ear and ear canal normal. Tympanic membrane is not erythematous, not retracted and not bulging.  Left Ear: Hearing, tympanic membrane, external ear and ear canal normal. Tympanic membrane is not erythematous, not retracted and not bulging.  Nose: No mucosal edema or rhinorrhea. Right sinus exhibits no maxillary sinus tenderness and no frontal sinus tenderness. Left sinus exhibits no maxillary sinus tenderness and no  frontal sinus tenderness.  Mouth/Throat: Uvula is midline, oropharynx is clear and moist and mucous membranes are normal.  Eyes: Conjunctivae, EOM and lids are normal. Pupils are equal, round, and reactive to light. Lids are everted and swept, no foreign bodies found.  Neck: Trachea normal and normal range of motion. Neck supple. Carotid bruit is not present. No mass and no thyromegaly present.  Cardiovascular: Normal rate, regular rhythm, S1 normal, S2 normal, intact distal pulses and normal pulses.  Exam reveals no gallop and no friction rub.   Murmur heard.  Systolic murmur is present with a grade of 1/6  Heard at RUSB  Pulmonary/Chest: Effort normal and breath sounds normal. Not tachypneic. No respiratory distress. She has no decreased breath sounds. She has no wheezes. She has no rhonchi. She has no rales.  Abdominal: Soft. Normal appearance and bowel sounds are normal. There is no tenderness.  Neurological: She is alert.  Skin: Skin is warm, dry and intact. No rash noted.  Psychiatric: Her speech is normal and behavior is normal. Judgment and thought content normal. Her mood appears not anxious. Cognition and memory are normal. She does not exhibit a depressed mood.          Assessment & Plan:  IN setting of ? New murmur, swelling, fatigue and DOE.  Will check EKG today. Recent liver check showed liver stable and not necessarily cause of swelling. TSH, cbc earlier in year normal will recheck given new symptoms along with BNP. Will eval with ECHO for valve issues and for CHF.  EKG showed slight sinus brady and  possible short PR interval.

## 2013-10-30 NOTE — Patient Instructions (Signed)
Stop at lab and front desk on way out for referral for ECHO.

## 2013-11-02 ENCOUNTER — Other Ambulatory Visit (INDEPENDENT_AMBULATORY_CARE_PROVIDER_SITE_OTHER): Payer: Private Health Insurance - Indemnity

## 2013-11-02 DIAGNOSIS — R5381 Other malaise: Secondary | ICD-10-CM

## 2013-11-02 DIAGNOSIS — R5383 Other fatigue: Principal | ICD-10-CM

## 2013-11-02 LAB — CBC WITH DIFFERENTIAL/PLATELET
BASOS ABS: 0 10*3/uL (ref 0.0–0.1)
Basophils Relative: 0.5 % (ref 0.0–3.0)
EOS PCT: 2 % (ref 0.0–5.0)
Eosinophils Absolute: 0.1 10*3/uL (ref 0.0–0.7)
HCT: 41.4 % (ref 36.0–46.0)
Hemoglobin: 14 g/dL (ref 12.0–15.0)
Lymphocytes Relative: 29.1 % (ref 12.0–46.0)
Lymphs Abs: 1.5 10*3/uL (ref 0.7–4.0)
MCHC: 33.8 g/dL (ref 30.0–36.0)
MCV: 95.5 fl (ref 78.0–100.0)
MONOS PCT: 14.4 % — AB (ref 3.0–12.0)
Monocytes Absolute: 0.8 10*3/uL (ref 0.1–1.0)
NEUTROS PCT: 54 % (ref 43.0–77.0)
Neutro Abs: 2.8 10*3/uL (ref 1.4–7.7)
PLATELETS: 66 10*3/uL — AB (ref 150.0–400.0)
RBC: 4.33 Mil/uL (ref 3.87–5.11)
RDW: 13.8 % (ref 11.5–15.5)
WBC: 5.3 10*3/uL (ref 4.0–10.5)

## 2013-11-02 LAB — BRAIN NATRIURETIC PEPTIDE: PRO B NATRI PEPTIDE: 52 pg/mL (ref 0.0–100.0)

## 2013-11-02 LAB — TSH: TSH: 0.49 u[IU]/mL (ref 0.35–4.50)

## 2013-11-05 ENCOUNTER — Other Ambulatory Visit (HOSPITAL_COMMUNITY): Payer: Self-pay | Admitting: Cardiology

## 2013-11-05 DIAGNOSIS — R0602 Shortness of breath: Secondary | ICD-10-CM

## 2013-11-05 DIAGNOSIS — R609 Edema, unspecified: Secondary | ICD-10-CM

## 2013-11-05 DIAGNOSIS — R011 Cardiac murmur, unspecified: Secondary | ICD-10-CM

## 2013-11-12 ENCOUNTER — Ambulatory Visit (INDEPENDENT_AMBULATORY_CARE_PROVIDER_SITE_OTHER): Payer: Medicare HMO | Admitting: Podiatrist

## 2013-11-13 ENCOUNTER — Other Ambulatory Visit: Payer: Self-pay

## 2013-11-13 ENCOUNTER — Other Ambulatory Visit (INDEPENDENT_AMBULATORY_CARE_PROVIDER_SITE_OTHER): Payer: Private Health Insurance - Indemnity

## 2013-11-13 DIAGNOSIS — R011 Cardiac murmur, unspecified: Secondary | ICD-10-CM

## 2013-11-13 DIAGNOSIS — R609 Edema, unspecified: Secondary | ICD-10-CM

## 2013-11-13 DIAGNOSIS — R0602 Shortness of breath: Secondary | ICD-10-CM

## 2013-11-20 ENCOUNTER — Ambulatory Visit (INDEPENDENT_AMBULATORY_CARE_PROVIDER_SITE_OTHER): Payer: Medicare HMO | Admitting: Podiatrist

## 2013-11-20 ENCOUNTER — Encounter: Payer: Self-pay | Admitting: Podiatrist

## 2013-11-20 VITALS — BP 122/63 | HR 72 | Resp 18

## 2013-11-20 DIAGNOSIS — M216X9 Other acquired deformities of unspecified foot: Secondary | ICD-10-CM

## 2013-11-20 DIAGNOSIS — Q828 Other specified congenital malformations of skin: Secondary | ICD-10-CM

## 2013-11-20 DIAGNOSIS — Q829 Congenital malformation of skin, unspecified: Secondary | ICD-10-CM

## 2013-11-20 MED ORDER — DIAZEPAM 5 MG PO TABS: 5.0000 mg | ORAL_TABLET | Freq: Four times a day (QID) | ORAL | Status: DC | PRN

## 2013-11-22 NOTE — Progress Notes (Signed)
Subjective: [name] presents today for continued pain of bilateral feet. She has multiple deeply enucleated lesions sub-met 1, 5, lateral foot, left and right lateral foot and heel. She states she had surgery in the past to try and elevate the first metatarsal and prevent the lesions from occurring. She relates this was unsuccessful and she continues to have painful lesions that occur regularly. We have been debriding the lesions every 2 weeks and she has noticed improvement. Today she applied EMLA cream to the lesions prior to her visit for debridement purposes. She states the right foot is not painful and does not require debridement today.  Objective:Patient is awake, alert, and oriented x 3. In no acute distress. Vascular status is intact with palpable pedal pulses at 2/4 DP and PT bilateral and capillary refill time within normal limits. Neurological sensation is also intact bilaterally via Semmes Weinstein monofilament at 5/5 sites. Light touch, vibratory sensation, Achilles tendon reflex is intact. Dermatological exam reveals multiple intractable hyperkeratotic lesions with a waxy-like appearance present. They are present submetatarsal 1, 4 and lateral left foot as well as the right lateral foot they're painful and symptomatic.  Assessment: , intractable hyperkeratotic lesions multiple in nature bilateral feet porokeratosis bilateral-- left is symptomatic today.   Plan: She anesthetized her feet with EMLA cream prior to her visit. I then debrided the lesions as deeply as she can tolerate with a chisel blade-- left foot only. I will see her back in 3 weeks for repeat treatment- rx for valium written. She will have a friend drive her and we will anesthetize the submet 4 lesion and deeply debride next visit. she will continue to apply EMLA cream prior to her visit

## 2013-12-11 ENCOUNTER — Ambulatory Visit (INDEPENDENT_AMBULATORY_CARE_PROVIDER_SITE_OTHER): Payer: Medicare HMO | Admitting: Podiatrist

## 2013-12-11 ENCOUNTER — Encounter: Payer: Self-pay | Admitting: Podiatrist

## 2013-12-11 VITALS — BP 129/73 | HR 59 | Resp 14 | Ht 64.0 in | Wt 160.0 lb

## 2013-12-11 DIAGNOSIS — Q828 Other specified congenital malformations of skin: Secondary | ICD-10-CM

## 2013-12-11 DIAGNOSIS — Q829 Congenital malformation of skin, unspecified: Secondary | ICD-10-CM

## 2013-12-11 DIAGNOSIS — M216X9 Other acquired deformities of unspecified foot: Secondary | ICD-10-CM

## 2013-12-11 NOTE — Progress Notes (Signed)
Subjective: [name] presents today for continued pain of left foot. She has multiple deeply enucleated lesions sub-met 1, 5, lateral foot, left  foot and heel. She states she had surgery in the past to try and elevate the first metatarsal and prevent the lesions from occurring. She relates this was unsuccessful and she continues to have painful lesions that occur regularly. We have been debriding the lesions every 2 weeks and she has noticed improvement. Today she applied EMLA cream to the lesions prior to her visit for debridement purposes. She states the right foot is not painful and does not require debridement today.   Objective:Patient is awake, alert, and oriented x 3. In no acute distress. Vascular status is intact with palpable pedal pulses at 2/4 DP and PT bilateral and capillary refill time within normal limits. Neurological sensation is also intact bilaterally via Semmes Weinstein monofilament at 5/5 sites. Light touch, vibratory sensation, Achilles tendon reflex is intact. Dermatological exam reveals multiple intractable hyperkeratotic lesions with a waxy-like appearance present.- submetatarsal 1,3/4 and plantar left heel. they're painful and symptomatic.   Assessment: , intractable hyperkeratotic lesions multiple in nature left foot porokeratosis bilateral--  Plan:  Debridement of lesions accomplished today without complication.  Pre-treated with emla cream for her comfort.  She will be seen back in 2 weeks or as needed for follow up

## 2014-01-01 ENCOUNTER — Ambulatory Visit: Payer: Medicare HMO | Admitting: Podiatrist

## 2014-01-12 ENCOUNTER — Encounter (INDEPENDENT_AMBULATORY_CARE_PROVIDER_SITE_OTHER): Payer: Private Health Insurance - Indemnity | Admitting: Family Medicine

## 2014-01-12 ENCOUNTER — Encounter: Payer: Self-pay | Admitting: Family Medicine

## 2014-01-12 ENCOUNTER — Other Ambulatory Visit: Payer: Self-pay | Admitting: Family Medicine

## 2014-01-12 NOTE — Progress Notes (Signed)
Pt left angry without being seen.

## 2014-01-12 NOTE — Progress Notes (Signed)
Pre visit review using our clinic review tool, if applicable. No additional management support is needed unless otherwise documented below in the visit note.  This encounter was created in error - please disregard.

## 2014-05-27 ENCOUNTER — Telehealth: Payer: Self-pay | Admitting: Family Medicine

## 2014-05-27 ENCOUNTER — Other Ambulatory Visit (INDEPENDENT_AMBULATORY_CARE_PROVIDER_SITE_OTHER): Payer: Medicare HMO

## 2014-05-27 DIAGNOSIS — Z13220 Encounter for screening for lipoid disorders: Secondary | ICD-10-CM | POA: Diagnosis not present

## 2014-05-27 DIAGNOSIS — E890 Postprocedural hypothyroidism: Secondary | ICD-10-CM

## 2014-05-27 DIAGNOSIS — Z79899 Other long term (current) drug therapy: Secondary | ICD-10-CM

## 2014-05-27 LAB — COMPREHENSIVE METABOLIC PANEL
ALT: 67 U/L — AB (ref 0–35)
AST: 105 U/L — AB (ref 0–37)
Albumin: 3.3 g/dL — ABNORMAL LOW (ref 3.5–5.2)
Alkaline Phosphatase: 85 U/L (ref 39–117)
BUN: 11 mg/dL (ref 6–23)
CALCIUM: 8.5 mg/dL (ref 8.4–10.5)
CHLORIDE: 107 meq/L (ref 96–112)
CO2: 28 mEq/L (ref 19–32)
Creatinine, Ser: 0.72 mg/dL (ref 0.40–1.20)
GFR: 88.02 mL/min (ref >60.00)
Glucose, Bld: 118 mg/dL — ABNORMAL HIGH (ref 70–99)
Potassium: 4 mEq/L (ref 3.5–5.1)
Sodium: 138 mEq/L (ref 135–145)
TOTAL PROTEIN: 7.5 g/dL (ref 6.0–8.3)
Total Bilirubin: 1.4 mg/dL — ABNORMAL HIGH (ref 0.2–1.2)

## 2014-05-27 LAB — LIPID PANEL
CHOLESTEROL: 148 mg/dL (ref 0–200)
HDL: 53 mg/dL (ref >39.00)
LDL CALC: 74 mg/dL (ref 0–99)
NonHDL: 95
TRIGLYCERIDES: 106 mg/dL (ref 0.0–149.0)
Total CHOL/HDL Ratio: 3
VLDL: 21.2 mg/dL (ref 0.0–40.0)

## 2014-05-27 NOTE — Telephone Encounter (Signed)
-----   Message from Ellamae Sia sent at 05/26/2014  4:10 PM EST ----- Regarding: Lab orders for Thursday, 3.3.16 Patient is scheduled for CPX labs, please order future labs, Thanks , Karna Christmas

## 2014-05-27 NOTE — Telephone Encounter (Signed)
-----   Message from Ellamae Sia sent at 05/19/2014 11:34 AM EST ----- Regarding: Lab orders for Thursday, 3.3.16 Patient is scheduled for CPX labs, please order future labs, Thanks , Karna Christmas

## 2014-06-03 ENCOUNTER — Ambulatory Visit (INDEPENDENT_AMBULATORY_CARE_PROVIDER_SITE_OTHER): Payer: Medicare HMO | Admitting: Family Medicine

## 2014-06-03 ENCOUNTER — Encounter: Payer: Self-pay | Admitting: Family Medicine

## 2014-06-03 VITALS — BP 119/58 | HR 63 | Temp 97.6°F | Ht 63.5 in | Wt 161.0 lb

## 2014-06-03 DIAGNOSIS — R7309 Other abnormal glucose: Secondary | ICD-10-CM | POA: Diagnosis not present

## 2014-06-03 DIAGNOSIS — Z7189 Other specified counseling: Secondary | ICD-10-CM | POA: Diagnosis not present

## 2014-06-03 DIAGNOSIS — R7303 Prediabetes: Secondary | ICD-10-CM

## 2014-06-03 DIAGNOSIS — B182 Chronic viral hepatitis C: Secondary | ICD-10-CM

## 2014-06-03 DIAGNOSIS — Z1211 Encounter for screening for malignant neoplasm of colon: Secondary | ICD-10-CM | POA: Diagnosis not present

## 2014-06-03 DIAGNOSIS — Z0000 Encounter for general adult medical examination without abnormal findings: Secondary | ICD-10-CM

## 2014-06-03 NOTE — Patient Instructions (Addendum)
Work on low [employer] , increase exercsie as able. Call to set up yearly GI appt for cirrhosis follow up. Stop at lab on way out to pick up stool. Call to schedule mammogram on your own.  Return when able for Pap smear. Stop at front desk for referral for nutritionist.

## 2014-06-03 NOTE — Assessment & Plan Note (Signed)
Recommend yearly visits with hep doctor, LFTs trending up.

## 2014-06-03 NOTE — Assessment & Plan Note (Signed)
New diagnosis. Referral to nutrition. Encouraged exercise, weight loss, healthy eating habits.

## 2014-06-03 NOTE — Progress Notes (Signed)
Pre visit review using our clinic review tool, if applicable. No additional management support is needed unless otherwise documented below in the visit note. 

## 2014-06-03 NOTE — Progress Notes (Signed)
I have personally reviewed the Medicare Annual Wellness questionnaire and have noted 1.The patient's medical and social history 2.Their use of alcohol, tobacco or illicit drugs 3.Their current medications and supplements 4.The patient's functional ability including ADL's, fall risks, home safety risks and hearing or visual  impairment. 5.Diet and physical activities 6.Evidence for depression or mood disorders The patients weight, height, BMI and visual acuity have been recorded in the chart I have made referrals, counseling and provided education to the patient based review of the above and I have provided the pt with a written personalized care plan for preventive services.  Cholesterol at goal on no med. Lab Results  Component Value Date   CHOL 148 05/27/2014   HDL 53.00 05/27/2014   LDLCALC 74 05/27/2014   TRIG 106.0 05/27/2014   CHOLHDL 3 05/27/2014              She has been walking 3 days a week 30-60 min. Diet: low fat foods, increased salad and water.  Prediabetes : new diagnosis.o family history.  Hypothyroid: stable Lab Results  Component Value Date   TSH 0.49 11/02/2013    Slight increase in LFTs  Secondary to cirrhosis.  Last saw [place] last year.... No further bleeding.  Wt Readings from Last 3 Encounters:  06/03/14 161 lb (73.029 kg)  01/12/14 164 lb 8 oz (74.617 kg)  12/11/13 160 lb (72.576 kg)    Review of Systems  Constitutional: Negative for fever and fatigue.  HENT: Negative for ear pain.  Eyes: Negative for pain.  Respiratory: Negative for chest tightness and shortness of breath.  Cardiovascular: Negative for chest pain, palpitations and leg swelling.  Gastrointestinal: Negative for abdominal pain.  She has noted more bloating in abdomen lately. Feeling full sooner. Genitourinary: Negative for dysuria.       Objective:   Physical Exam  Constitutional: Vital signs are  normal. She appears well-developed and well-nourished. She is cooperative. Non-toxic appearance. She does not appear ill. No distress.  HENT:  Head: Normocephalic.  Right Ear: Hearing, tympanic membrane, external ear and ear canal normal.  Left Ear: Hearing, tympanic membrane, external ear and ear canal normal.  Nose: Nose normal.  Eyes: Conjunctivae, EOM and lids are normal. Pupils are equal, round, and reactive to light. Lids are everted and swept, no foreign bodies found.  Neck: Trachea normal and normal range of motion. Neck supple. Carotid bruit is not present. No mass and no thyromegaly present.  Cardiovascular: Normal rate, regular rhythm, S1 normal, S2 normal, normal heart sounds and intact distal pulses. Exam reveals no gallop.  No murmur heard. Pulmonary/Chest: Effort normal and breath sounds normal. No respiratory distress. She has no wheezes. She has no rhonchi. She has no rales.  Abdominal: Soft. Mild distention and bowel sounds are normal. She exhibits no distension, no fluid wave, no abdominal bruit and no mass. There is no splenomegaly, mild hepatomegaly.. There is no tenderness. There is no rebound, no guarding and no CVA tenderness. No hernia.  Genitourinary: No breast swelling, tenderness, discharge or bleeding.  Refused pelvic exam today  Lymphadenopathy:   She has no cervical adenopathy.  She has no axillary adenopathy.  Neurological: She is alert. She has normal strength. No cranial nerve deficit or sensory deficit.  Skin: Skin is warm, dry and intact. No rash noted.  Psychiatric: Her speech is normal and behavior is normal. Judgment normal. Her mood appears not anxious. Cognition and memory are normal. She does not exhibit a depressed mood.  Assessment & Plan:  The patient's preventative maintenance and recommended screening tests for an annual wellness exam were reviewed in full today. Brought up to date unless services declined.  Counselled on the  importance of diet, exercise, and its role in overall health and mortality. The patient's FH and SH was reviewed, including their home life, tobacco status, and drug and alcohol status.   Vaccines: Due for tdap,flu up to date with hep A abd B. She refuses today. Colon: Neg ifob last year, continue yearly. Mammo: Due PAP/DVE: last pap 3 years ago, due this year, but she refuses DEXA: no family history of osteoporosis known

## 2014-06-22 ENCOUNTER — Ambulatory Visit: Payer: Self-pay | Admitting: Family Medicine

## 2014-06-23 ENCOUNTER — Encounter: Payer: Self-pay | Admitting: Family Medicine

## 2014-06-29 ENCOUNTER — Ambulatory Visit
Admit: 2014-06-29 | Disposition: A | Discharge status: Home / Self Care | Payer: Self-pay | Attending: Family Medicine | Admitting: Family Medicine

## 2014-07-16 ENCOUNTER — Other Ambulatory Visit: Payer: Private Health Insurance - Indemnity

## 2014-07-23 ENCOUNTER — Encounter: Payer: Private Health Insurance - Indemnity | Admitting: Family Medicine

## 2014-07-29 ENCOUNTER — Ambulatory Visit: Payer: Medicare HMO | Admitting: Dietician

## 2014-08-06 ENCOUNTER — Encounter: Payer: Self-pay | Admitting: Dietician

## 2014-08-06 NOTE — Progress Notes (Signed)
Mailed discharge letter to Dr. Eliezer Lofts. Pt. did not show for her 07/29/14 Medical Nutrition Follow-up appt. and has not responded to a phone message requesting that she reschedule.

## 2014-09-03 ENCOUNTER — Encounter: Payer: Self-pay | Admitting: Nurse Practitioner

## 2014-09-03 ENCOUNTER — Ambulatory Visit (INDEPENDENT_AMBULATORY_CARE_PROVIDER_SITE_OTHER): Payer: Medicare HMO | Admitting: Nurse Practitioner

## 2014-09-03 VITALS — BP 113/69 | HR 68 | Temp 98.2°F | Resp 18 | Ht 63.5 in | Wt 160.0 lb

## 2014-09-03 DIAGNOSIS — J069 Acute upper respiratory infection, unspecified: Secondary | ICD-10-CM

## 2014-09-03 NOTE — Patient Instructions (Addendum)
You have a virus causing your symptoms. The average duration of cold symptoms is 14 days.  For nasal congestion: Start daily sinus rinses (neilmed Sinus Rinse). Use 30 mg to 60 mg pseudoephedrine 2 to 3 times daily. Vicks vapor rub under nose to help breathe. For sore throat: Benzocaine throat lozenges for sore throat. For cough:  Use cough syrup at night-delsym.  Tylenol or ibuprophen for headache & body aches. Sip fluids every hour. Rest. Please follow up with Dr Diona Browner in 2 weeks or sooner if you feel worse or are running fever.  Feel better!  Upper Respiratory Infection, Adult An upper respiratory infection (URI) is also sometimes known as the common cold. The upper respiratory tract includes the nose, sinuses, throat, trachea, and bronchi. Bronchi are the airways leading to the lungs. Most people improve within 1 week, but symptoms can last up to 2 weeks. A residual cough may last even longer.  CAUSES Many different viruses can infect the tissues lining the upper respiratory tract. The tissues become irritated and inflamed and often become very moist. Mucus production is also common. A cold is contagious. You can easily spread the virus to others by oral contact. This includes kissing, sharing a glass, coughing, or sneezing. Touching your mouth or nose and then touching a surface, which is then touched by another person, can also spread the virus. SYMPTOMS  Symptoms typically develop 1 to 3 days after you come in contact with a cold virus. Symptoms vary from person to person. They may include:  Runny nose.  Sneezing.  Nasal congestion.  Sinus irritation.  Sore throat.  Loss of voice (laryngitis).  Cough.  Fatigue.  Muscle aches.  Loss of appetite.  Headache.  Low-grade fever. DIAGNOSIS  You might diagnose your own cold based on familiar symptoms, since most people get a cold 2 to 3 times a year. Your caregiver can confirm this based on your exam. Most importantly, your  caregiver can check that your symptoms are not due to another disease such as strep throat, sinusitis, pneumonia, asthma, or epiglottitis. Blood tests, throat tests, and X-rays are not necessary to diagnose a common cold, but they may sometimes be helpful in excluding other more serious diseases. Your caregiver will decide if any further tests are required. RISKS AND COMPLICATIONS  You may be at risk for a more severe case of the common cold if you smoke cigarettes, have chronic heart disease (such as heart failure) or lung disease (such as asthma), or if you have a weakened immune system. The very young and very old are also at risk for more serious infections. Bacterial sinusitis, middle ear infections, and bacterial pneumonia can complicate the common cold. The common cold can worsen asthma and chronic obstructive pulmonary disease (COPD). Sometimes, these complications can require emergency medical care and may be life-threatening. PREVENTION  The best way to protect against getting a cold is to practice good hygiene. Avoid oral or hand contact with people with cold symptoms. Wash your hands often if contact occurs. There is no clear evidence that vitamin C, vitamin E, echinacea, or exercise reduces the chance of developing a cold. However, it is always recommended to get plenty of rest and practice good nutrition. TREATMENT  Treatment is directed at relieving symptoms. There is no cure. Antibiotics are not effective, because the infection is caused by a virus, not by bacteria. Treatment may include:  Increased fluid intake. Sports drinks offer valuable electrolytes, sugars, and fluids.  Breathing heated mist or  steam (vaporizer or shower).  Eating chicken soup or other clear broths, and maintaining good nutrition.  Getting plenty of rest.  Using gargles or lozenges for comfort.  Controlling fevers with ibuprofen or acetaminophen as directed by your caregiver.  Increasing usage of your  inhaler if you have asthma. Zinc gel and zinc lozenges, taken in the first 24 hours of the common cold, can shorten the duration and lessen the severity of symptoms. Pain medicines may help with fever, muscle aches, and throat pain. A variety of non-prescription medicines are available to treat congestion and runny nose. Your caregiver can make recommendations and may suggest nasal or lung inhalers for other symptoms.  HOME CARE INSTRUCTIONS   Only take over-the-counter or prescription medicines for pain, discomfort, or fever as directed by your caregiver.  Use a warm mist humidifier or inhale steam from a shower to increase air moisture. This may keep secretions moist and make it easier to breathe.  Drink enough water and fluids to keep your urine clear or pale yellow.  Rest as needed.  Return to work when your temperature has returned to normal or as your caregiver advises. You may need to stay home longer to avoid infecting others. You can also use a face mask and careful hand washing to prevent spread of the virus. SEEK MEDICAL CARE IF:   After the first few days, you feel you are getting worse rather than better.  You need your caregiver's advice about medicines to control symptoms.  You develop chills, worsening shortness of breath, or brown or red sputum. These may be signs of pneumonia.  You develop yellow or brown nasal discharge or pain in the face, especially when you bend forward. These may be signs of sinusitis.  You develop a fever, swollen neck glands, pain with swallowing, or white areas in the back of your throat. These may be signs of strep throat. SEEK IMMEDIATE MEDICAL CARE IF:   You have a fever.  You develop severe or persistent headache, ear pain, sinus pain, or chest pain.  You develop wheezing, a prolonged cough, cough up blood, or have a change in your usual mucus (if you have chronic lung disease).  You develop sore muscles or a stiff neck. Document  Released: 09/05/2000 Document Revised: 06/04/2011 Document Reviewed: 07/14/2010 [facility] Patient Information 2014 [place], [place].

## 2014-09-03 NOTE — Progress Notes (Signed)
   Subjective:    Patient ID: [name], female    DOB: Feb 26, 1956, 59 y.o.   MRN: [id number]  HPI Comments: Pt has Hep C  Sore Throat  This is a new problem. The current episode started in the past 7 days (2d). The problem has been waxing and waning (sore throat better, nasal congestion worse). There has been no fever (reports chills yesterday). Associated symptoms include congestion and coughing. Pertinent negatives include no abdominal pain, diarrhea, ear pain, headaches, plugged ear sensation, neck pain, shortness of breath, swollen glands or vomiting. Treatments tried: robitussin. The treatment provided no relief.      Review of Systems  HENT: Positive for congestion. Negative for ear pain.   Respiratory: Positive for cough. Negative for shortness of breath.   Gastrointestinal: Negative for vomiting, abdominal pain and diarrhea.  Musculoskeletal: Negative for neck pain.  Neurological: Negative for headaches.       Objective:   Physical Exam  Constitutional: She is oriented to person, place, and time. She appears well-developed and well-nourished. No distress.  HENT:  Head: Normocephalic and atraumatic.  Right Ear: External ear normal.  Left Ear: External ear normal.  Mouth/Throat: No oropharyngeal exudate.  Posterior pharynx erythematous   Eyes: Conjunctivae are normal. Right eye exhibits no discharge. Left eye exhibits no discharge.  Neck: Normal range of motion. Neck supple. No thyromegaly present.  Cardiovascular: Normal rate, regular rhythm and normal heart sounds.   No murmur heard. Pulmonary/Chest: Effort normal and breath sounds normal.  Lymphadenopathy:    She has no cervical adenopathy.  Neurological: She is alert and oriented to person, place, and time.  Skin: Skin is warm and dry.  Psychiatric: She has a normal mood and affect. Her behavior is normal. Thought content normal.  Vitals reviewed.         Assessment & Plan:  1. Acute upper respiratory  infection Symptoms management F/u 2 weeks See pt instrictions

## 2014-09-03 NOTE — Progress Notes (Signed)
Pre visit review using our clinic review tool, if applicable. No additional management support is needed unless otherwise documented below in the visit note. 

## 2014-09-13 ENCOUNTER — Telehealth: Payer: Self-pay | Admitting: Family Medicine

## 2014-09-13 ENCOUNTER — Ambulatory Visit: Payer: Medicare HMO | Admitting: Primary Care

## 2014-09-13 NOTE — Telephone Encounter (Signed)
No follow up needed.  Thanks

## 2014-09-13 NOTE — Telephone Encounter (Signed)
Patient did not come in for their appointment today for throat.  Please let me know if patient needs to be contacted immediately for follow up or no follow up needed.

## 2014-09-17 ENCOUNTER — Ambulatory Visit (INDEPENDENT_AMBULATORY_CARE_PROVIDER_SITE_OTHER): Payer: Medicare HMO | Admitting: Family Medicine

## 2014-09-17 ENCOUNTER — Encounter: Payer: Self-pay | Admitting: Family Medicine

## 2014-09-17 VITALS — BP 114/68 | HR 64 | Temp 97.6°F | Wt 156.5 lb

## 2014-09-17 DIAGNOSIS — B9689 Other specified bacterial agents as the cause of diseases classified elsewhere: Secondary | ICD-10-CM

## 2014-09-17 DIAGNOSIS — J208 Acute bronchitis due to other specified organisms: Principal | ICD-10-CM

## 2014-09-17 DIAGNOSIS — R197 Diarrhea, unspecified: Secondary | ICD-10-CM | POA: Diagnosis not present

## 2014-09-17 DIAGNOSIS — R630 Anorexia: Secondary | ICD-10-CM | POA: Diagnosis not present

## 2014-09-17 DIAGNOSIS — J00 Acute nasopharyngitis [common cold]: Secondary | ICD-10-CM

## 2014-09-17 MED ORDER — GUAIFENESIN-CODEINE 100-10 MG/5ML PO SYRP: 5.0000 mL | ORAL_SOLUTION | Freq: Every evening | ORAL | Status: DC | PRN

## 2014-09-17 MED ORDER — AZITHROMYCIN 250 MG PO TABS: ORAL_TABLET | ORAL | Status: DC

## 2014-09-17 NOTE — Assessment & Plan Note (Signed)
Most likely superimposed vitral GE given weakened from bronchitis. Encourage fluids, return to [employer].

## 2014-09-17 NOTE — Progress Notes (Signed)
   Subjective:    Patient ID: [name], female    DOB: Jul 31, 1955, 59 y.o.   MRN: [id number]  HPI  59 year old female soker presents for 2 week follow up on URI.  Seen at [facility] on 09/03/2014. Felt due to viral infection, supportive care given.  She was unhappy with care at [facility].  Symptoms have not improved at all.  No appetite, losing weight, continued cough (productive, yellow), congestion. No face pain, no ear pain. Sore throat mild.  Wheezing at night, no SOB.  Diarrhea with every meal, loose stool x 6 days. Not eating regularly. No vomiting, no chills, no fever. No blood in stool.  No sick contacts.  Social History /Family History/Past Medical History reviewed and updated if needed.  Wt Readings from Last 3 Encounters:  09/17/14 156 lb 8 oz (70.988 kg)  09/03/14 160 lb (72.576 kg)  06/03/14 161 lb (73.029 kg)    Has not used anything for diarrhea, has tried multiple cough meds OTC.   Review of Systems  Constitutional: Negative for fever and fatigue.  HENT: Negative for ear pain, sinus pressure and tinnitus.   Eyes: Negative for pain.  Respiratory: Positive for cough. Negative for chest tightness and shortness of breath.   Cardiovascular: Negative for chest pain, palpitations and leg swelling.  Gastrointestinal: Negative for abdominal pain.  Genitourinary: Negative for dysuria.  Neurological: Negative for dizziness.       Objective:   Physical Exam  Constitutional: Vital signs are normal. She appears well-developed and well-nourished. She is cooperative.  Non-toxic appearance. She does not appear ill. No distress.  HENT:  Head: Normocephalic.  Right Ear: Hearing, tympanic membrane, external ear and ear canal normal. Tympanic membrane is not erythematous, not retracted and not bulging.  Left Ear: Hearing, tympanic membrane, external ear and ear canal normal. Tympanic membrane is not erythematous, not retracted and not bulging.  Nose: Mucosal edema and  rhinorrhea present. Right sinus exhibits no maxillary sinus tenderness and no frontal sinus tenderness. Left sinus exhibits no maxillary sinus tenderness and no frontal sinus tenderness.  Mouth/Throat: Uvula is midline, oropharynx is clear and moist and mucous membranes are normal.  Eyes: Conjunctivae, EOM and lids are normal. Pupils are equal, round, and reactive to light. Lids are everted and swept, no foreign bodies found.  Neck: Trachea normal and normal range of motion. Neck supple. Carotid bruit is not present. No thyroid mass and no thyromegaly present.  Cardiovascular: Normal rate, regular rhythm, S1 normal, S2 normal, normal heart sounds, intact distal pulses and normal pulses.  Exam reveals no gallop and no friction rub.   No murmur heard. Pulmonary/Chest: Effort normal and breath sounds normal. No tachypnea. No respiratory distress. She has no decreased breath sounds. She has no wheezes. She has no rhonchi. She has no rales.  Neurological: She is alert.  Skin: Skin is warm, dry and intact. No rash noted.  Psychiatric: Her speech is normal and behavior is normal. Judgment normal. Her mood appears not anxious. Cognition and memory are normal. She does not exhibit a depressed mood.          Assessment & Plan:

## 2014-09-17 NOTE — Patient Instructions (Signed)
Start and complete antibiotic.  Push lots of fluid, water.  Try to eat three meals a day, can be bland. That should help stool get back to back normal faster.  Call if not improving  In 7-10 day, abdominal pain or blood in stool.

## 2014-09-17 NOTE — Assessment & Plan Note (Signed)
>   2 weeks in smoker. Treat with antibitoics. Cough suppressant at night.

## 2014-09-17 NOTE — Progress Notes (Signed)
Pre visit review using our clinic review tool, if applicable. No additional management support is needed unless otherwise documented below in the visit note. 

## 2014-10-12 ENCOUNTER — Encounter: Payer: Self-pay | Admitting: Family Medicine

## 2014-10-12 ENCOUNTER — Ambulatory Visit (INDEPENDENT_AMBULATORY_CARE_PROVIDER_SITE_OTHER): Payer: Medicare HMO | Admitting: Family Medicine

## 2014-10-12 ENCOUNTER — Ambulatory Visit (INDEPENDENT_AMBULATORY_CARE_PROVIDER_SITE_OTHER)
Admission: RE | Admit: 2014-10-12 | Discharge: 2014-10-12 | Disposition: A | Discharge status: Home / Self Care | Payer: Medicare HMO | Source: Ambulatory Visit | Attending: Family Medicine | Admitting: Family Medicine

## 2014-10-12 VITALS — BP 102/58 | HR 74 | Temp 97.4°F | Wt 158.4 lb

## 2014-10-12 DIAGNOSIS — M25561 Pain in right knee: Secondary | ICD-10-CM

## 2014-10-12 MED ORDER — MELOXICAM 15 MG PO TABS: 15.0000 mg | ORAL_TABLET | Freq: Every day | ORAL | Status: DC

## 2014-10-12 NOTE — Patient Instructions (Signed)
Get a pull on neoprene knee sleeve with hole in the middle to wear while active  Use ice any chance you get  Elevate knee when not on it  Try meloxicam 15 mg daily with a meal for knee pain and inflammation  Xray now - we will contact you with results later

## 2014-10-12 NOTE — Assessment & Plan Note (Signed)
Tender in med joint line and also patellofemoral area  Disc poss etiologies and handout given Xray now mobic 15 mg with food daily  Elevation/ice and neoprene knee sleeve  Pending result for plan

## 2014-10-12 NOTE — Progress Notes (Signed)
Subjective:    Patient ID: [name], female    DOB: 1955-07-13, 59 y.o.   MRN: [id number]  HPI Here with R knee pain   Hurting for 2 months on and off Thought perhaps arthritis  Yesterday and today- started to swell  Rough night last night - "screaming and hollering in pain" Could not get comfortable Tried heat and ice also  Aleve and tylenol as well   Points to patellar tendon as area of worse pain  Thinks it is swollen behind the knee   No redness or heat   No specific past injuries   Hx of arthritis in R toe   Patient Active Problem List   Diagnosis Date Noted  . Acute bacterial bronchitis 09/17/2014  . Diarrhea 09/17/2014  . Loss of appetite 09/17/2014  . Acute upper respiratory infection 09/03/2014  . Prediabetes 06/03/2014  . Counseling regarding end of life decision making 06/03/2014  . Systolic murmur 70/03/7492  . Overweight 11/12/2012  . Unspecified constipation 05/16/2012  . History of alcohol abuse   . Smoker   . Chronic hepatitis C   . GERD (gastroesophageal reflux disease)   . Bipolar 1 disorder   . Migraines   . Cirrhosis   . Hypothyroidism, postsurgical 07/16/2011   Past Medical History  Diagnosis Date  . Migraines   . Acquired hypothyroidism     thyroid goiter s/p thyroidectomy  . Bipolar 1 disorder 1990s  . Insomnia   . Varicose veins   . GERD (gastroesophageal reflux disease)     Hpylori + and treated (2012)  . Depression     states seen weekly at [facility]  . Complication of anesthesia     states 15 yrs ago, hard time waking up, states cant breathe when coming out of anesthesia  . Chronic hepatitis C     genotype 1a  . Compensated HCV cirrhosis   . Smoker   . History of alcohol abuse   . Portal hypertensive gastropathy 2013  . Portal hypertension    Past Surgical History  Procedure Laterality Date  . Appendectomy    . Liver biopsy    . Diagnostic laparoscopy      15 yrs ago  . Thyroidectomy  05/17/2011   Procedure: TOTAL THYROIDECTOMY;  Surgeon: Earnstine Regal, MD, benign goiter per path report  . Left foot surgery  1990s  . Esophagogastroduodenoscopy  10/2010    nl esophagus, HH, portal hypertensive gastropathy, erosive gastropathy, Hpylori + (Dr. Sydnee Levans)   History  Substance Use Topics  . Smoking status: Current Every Day Smoker -- 0.50 packs/day for 40 years    Types: Cigarettes  . Smokeless tobacco: Former Systems developer     Comment: decreasing use 05/30/2011  . Alcohol Use: No   Family History  Problem Relation Age of Onset  . Heart disease Mother     pacemaker  . Cancer Father     bladder  . Diabetes Father   . Hypertension Father   . Cancer Father     melanoma   Allergies  Allergen Reactions  . Demerol Anaphylaxis  . Penicillins Anaphylaxis, Hives and Itching    Other reaction(s): ANAPHYLAXIS  . Aspirin Nausea And Vomiting    Other reaction(s): OTHER  . Oxycodone-Acetaminophen     Other reaction(s): RASH   Current Outpatient Prescriptions on File Prior to Visit  Medication Sig Dispense Refill  . citalopram (CELEXA) 20 MG tablet     . lamoTRIgine (LAMICTAL) 200 MG tablet  Take 1 tablet (200 mg total) by mouth daily. (Patient taking differently: Take 150 mg by mouth 2 (two) times daily. ) 30 tablet 3  . LATUDA 40 MG TABS tablet     . lidocaine-prilocaine (EMLA) cream Apply 1 application topically as needed. 30 g 5  . SYNTHROID 88 MCG tablet TAKE 1 TABLET BY MOUTH DAILY 30 tablet 10  . azithromycin (ZITHROMAX) 250 MG tablet 2 tab po x 1 day then 1 tab po daily (Patient not taking: Reported on 10/12/2014) 6 tablet 0  . guaiFENesin-codeine (ROBITUSSIN AC) 100-10 MG/5ML syrup Take 5-10 mLs by mouth at bedtime as needed for cough. (Patient not taking: Reported on 10/12/2014) 180 mL 0   No current facility-administered medications on file prior to visit.      Review of Systems Review of Systems  Constitutional: Negative for fever, appetite change, fatigue and unexpected weight  change.  Eyes: Negative for pain and visual disturbance.  Respiratory: Negative for cough and shortness of breath.   Cardiovascular: Negative for cp or palpitations    Gastrointestinal: Negative for nausea, diarrhea and constipation.  Genitourinary: Negative for urgency and frequency.  Skin: Negative for pallor or rash   MSK pos for R knee pain  Neurological: Negative for weakness, light-headedness, numbness and headaches.  Hematological: Negative for adenopathy. Does not bruise/bleed easily.  Psychiatric/Behavioral: Negative for dysphoric mood. The patient is not nervous/anxious.         Objective:   Physical Exam  Constitutional: She appears well-developed and well-nourished. No distress.  HENT:  Head: Normocephalic and atraumatic.  Cardiovascular: Normal rate and regular rhythm.   Musculoskeletal: She exhibits tenderness.       Right knee: She exhibits decreased range of motion and swelling. She exhibits no effusion, no ecchymosis, no deformity, no erythema, no LCL laxity, normal patellar mobility, no bony tenderness and no MCL laxity. Tenderness found. Medial joint line tenderness noted.  R knee tenderness over medial joint line and also patellofemoral area  slt fullness No obv effusion No crepitus Flex 90 deg with pain  Ext full Nl stability on drawer and lachman exams  Can bear weight on that extremity  Nl sens and perfusion    Neurological: She is alert. She has normal reflexes.  Skin: Skin is warm and dry. No rash noted. No erythema.  Psychiatric: She has a normal mood and affect.          Assessment & Plan:   Problem List Items Addressed This Visit    Right knee pain - Primary    Tender in med joint line and also patellofemoral area  Disc poss etiologies and handout given Xray now mobic 15 mg with food daily  Elevation/ice and neoprene knee sleeve  Pending result for plan      Relevant Orders   DG Knee Complete 4 Views Right (Completed)

## 2014-10-12 NOTE — Progress Notes (Signed)
Pre visit review using our clinic review tool, if applicable. No additional management support is needed unless otherwise documented below in the visit note. 

## 2014-11-25 ENCOUNTER — Other Ambulatory Visit: Payer: Self-pay | Admitting: Family Medicine

## 2014-11-25 DIAGNOSIS — E890 Postprocedural hypothyroidism: Secondary | ICD-10-CM

## 2014-11-25 NOTE — Telephone Encounter (Signed)
Pt needs to havre TSH done now unless done elsewhere. Refilled, no further refills until lab visit.

## 2014-11-25 NOTE — Telephone Encounter (Signed)
Last office visit 10/12/2014 with Dr. Glori Bickers for knee pain.  Last TSH 11/02/2013.  CPE scheduled for 06/07/2015.  Ok to refill until scheduled CPE??

## 2014-11-26 NOTE — Addendum Note (Signed)
Addended by: Carter Kitten on: 11/26/2014 12:27 PM   Modules accepted: Orders

## 2014-11-26 NOTE — Telephone Encounter (Signed)
Lab appointment scheduled for 11/30/2014 at 8:15 am to check TSH.

## 2014-11-26 NOTE — Telephone Encounter (Signed)
Left message for [name] to return my call

## 2014-11-30 ENCOUNTER — Other Ambulatory Visit (INDEPENDENT_AMBULATORY_CARE_PROVIDER_SITE_OTHER): Payer: Medicare HMO

## 2014-11-30 ENCOUNTER — Encounter: Payer: Self-pay | Admitting: *Deleted

## 2014-11-30 DIAGNOSIS — E890 Postprocedural hypothyroidism: Secondary | ICD-10-CM

## 2014-11-30 LAB — TSH: TSH: 1.22 u[IU]/mL (ref 0.35–4.50)

## 2014-12-01 ENCOUNTER — Other Ambulatory Visit: Payer: Self-pay | Admitting: Family Medicine

## 2014-12-23 ENCOUNTER — Telehealth: Payer: Self-pay | Admitting: *Deleted

## 2014-12-23 MED ORDER — LIDOCAINE-PRILOCAINE 2.5-2.5 % EX CREA: 1.0000 "application " | TOPICAL_CREAM | CUTANEOUS | Status: DC | PRN

## 2014-12-23 NOTE — Telephone Encounter (Signed)
Fax received for EMLA Cream.

## 2014-12-28 ENCOUNTER — Ambulatory Visit (INDEPENDENT_AMBULATORY_CARE_PROVIDER_SITE_OTHER): Payer: Medicare HMO | Admitting: Family Medicine

## 2014-12-28 ENCOUNTER — Encounter: Payer: Self-pay | Admitting: Family Medicine

## 2014-12-28 VITALS — BP 110/66 | HR 94 | Temp 98.5°F | Ht 63.5 in | Wt 158.5 lb

## 2014-12-28 DIAGNOSIS — R21 Rash and other nonspecific skin eruption: Secondary | ICD-10-CM

## 2014-12-28 DIAGNOSIS — L299 Pruritus, unspecified: Secondary | ICD-10-CM

## 2014-12-28 MED ORDER — TRIAMCINOLONE ACETONIDE 0.5 % EX CREA: 1.0000 "application " | TOPICAL_CREAM | Freq: Two times a day (BID) | CUTANEOUS | Status: DC

## 2014-12-28 NOTE — Patient Instructions (Addendum)
Start OTC generic zyrtec. Can apply topical triamcinolone cream to most itchy areas.  If not improving consider hep C and liver issues versus new medication latuda as possible cause.

## 2014-12-28 NOTE — Progress Notes (Signed)
   Subjective:    Patient ID: [name], female    DOB: [birth year], 59 y.o.   MRN: [id number]  HPI  59 year old female with bipolar disorder, chronic hep C, hypothyroid presents with new onset itching all over.   She reports she first noted itching [place]. People visit as a summer house, dogs are there at times. Slept in bed there. Next AM she awoke with itching all over. She noted rash, little red bumps.. Like bites. ( appear to me more like more spider angioma On neck, head, scalp, arms, ears, groin in armpits.  No rash on hands or feet or legs. Felt like creepy crawly feeling of something on leg..nothing there.  No other sick contacts known.  She has put on oatmeal lotion. She applied gasoline on skin! Helped a little.  Social History /Family History/Past Medical History reviewed and updated if needed. She is on lamictal ( dermatitis), and latuda (puritis, but she has been on 3-4 months)) . All which can cause ithcing  Has elevated LFTs from chronic cirrhosis hep C. She has chronic spider angiomas on chest.  Review of Systems  Constitutional: Negative for fever and fatigue.  HENT: Negative for ear pain.   Respiratory: Negative for shortness of breath.   Cardiovascular: Negative for chest pain.  Gastrointestinal: Negative for abdominal pain.       Objective:   Physical Exam  Constitutional: She appears well-developed and well-nourished.  HENT:  Right Ear: External ear normal.  Left Ear: External ear normal.  Eyes: Conjunctivae are normal. Pupils are equal, round, and reactive to light.  Cardiovascular: Normal rate and normal heart sounds.   No murmur heard. Pulmonary/Chest: Effort normal. No respiratory distress. She has no wheezes. She has no rales. She exhibits no tenderness.  Skin:  Chronic spider angiomas on chests, pt state new lesion on arms, appear vascular as well, no raised areas, no whelps  no  excoriations.          Assessment & Plan:  Rash with pruritis: Minimal rash on exam, may be chronic although pt had not noted before.  In exposed regions.. Not consistent with scabies, possibly consistent with bed bugs.  Pt also with Hep C/cirrhosis and on Latuda than may cause pruritis.  Treat with antihistamine and topical steroid if not improving consider other etiology.

## 2014-12-28 NOTE — Progress Notes (Signed)
Pre visit review using our clinic review tool, if applicable. No additional management support is needed unless otherwise documented below in the visit note. 

## 2015-01-24 ENCOUNTER — Telehealth: Payer: Self-pay | Admitting: Family Medicine

## 2015-01-24 NOTE — Telephone Encounter (Signed)
I have not called Ms. Leitzke.

## 2015-01-24 NOTE — Telephone Encounter (Signed)
Pt returned your call.  

## 2015-01-24 NOTE — Telephone Encounter (Signed)
It sounds as if she has emergent ascites given cirrhosis - ER most appropriate.

## 2015-01-24 NOTE — Telephone Encounter (Signed)
Spoke with pt and pt is going to go to [facility] ED for eval.

## 2015-01-24 NOTE — Telephone Encounter (Signed)
[facility] Patient Name: LIBERTY SETO DOB: [birth year] Initial Comment caller states she has swelling in right knee and pain, swelling in abdomen and right hand and fingers Nurse Assessment Nurse: Markus Daft, RN, Sherre Poot Date/Time (Eastern Time): 01/24/2015 10:52:14 AM Confirm and document reason for call. If symptomatic, describe symptoms. ---Caller states she has swelling in right knee with pain, right hand/fingers and swelling in abdomen "like she is 9 months pregnant". She has been taking Aleve for knee pain. This swelling started last Wednesday and worsening today. No injury to knee. Knee pain flares up from time to time but this time a lot worse, rates 8/10. Denies abdominal pain, CP, SOB. Has the patient traveled out of the country within the last 30 days? ---No Does the patient have any new or worsening symptoms? ---Yes Will a triage be completed? ---Yes Related visit to physician within the last 2 weeks? ---No Does the PT have any chronic conditions? (i.e. diabetes, asthma, etc.) ---Yes List chronic conditions. ---Cirrhosis of liver secondary to ETOH abuse Guidelines Guideline Title Affirmed Question Affirmed Notes Knee Pain [1] SEVERE pain (e.g., excruciating, unable to walk) AND [2] not improved after 2 hours of pain medicine Final Disposition User See Physician within 4 Hours (or PCP triage) Markus Daft, RN, Windy Comments Pt states that she can not come to office right now, but could possibly later today. She has to visit a sick relative at the hospital now. --RN looked for appt time today, and nothing open. RN advised caller that someone from office would contact her. Otherwise, if she can't be seen at office, she needs to go to ER within 4 hours. Caller verb. understanding. Referrals GO TO FACILITY REFUSED GO TO FACILITY UNDECIDED Disagree/Comply: Comply

## 2015-01-25 ENCOUNTER — Emergency Department
Admission: EM | Admit: 2015-01-25 | Discharge: 2015-01-25 | Discharge status: Against Medical Advice | Payer: Medicare HMO | Attending: Emergency Medicine | Admitting: Emergency Medicine

## 2015-01-25 ENCOUNTER — Encounter: Payer: Self-pay | Admitting: Emergency Medicine

## 2015-01-25 ENCOUNTER — Telehealth: Payer: Self-pay | Admitting: Family Medicine

## 2015-01-25 DIAGNOSIS — R2231 Localized swelling, mass and lump, right upper limb: Secondary | ICD-10-CM | POA: Insufficient documentation

## 2015-01-25 DIAGNOSIS — Z720 Tobacco use: Secondary | ICD-10-CM | POA: Insufficient documentation

## 2015-01-25 DIAGNOSIS — R2241 Localized swelling, mass and lump, right lower limb: Secondary | ICD-10-CM | POA: Insufficient documentation

## 2015-01-25 DIAGNOSIS — R110 Nausea: Secondary | ICD-10-CM | POA: Insufficient documentation

## 2015-01-25 DIAGNOSIS — R140 Abdominal distension (gaseous): Secondary | ICD-10-CM | POA: Insufficient documentation

## 2015-01-25 DIAGNOSIS — R630 Anorexia: Secondary | ICD-10-CM | POA: Diagnosis not present

## 2015-01-25 NOTE — ED Notes (Addendum)
Patient to ER for right knee swelling, right sided hand swelling. Patient also reports abdominal swelling. Reports decreased appetite as well. +Nausea after eating. Denies shortness of breath. Denies fevers.

## 2015-01-25 NOTE — Telephone Encounter (Signed)
Patient called yesterday and spoke to [facility] and was told to go to the ER.  Patient called today from the [facility] ER and said she went there today.  Patient said they took her back to draw blood and after 2 unsuccessful attempts told her to go back out to the waiting room.  Patient called to schedule an appointment at our office and we told her to stay at the ER to be treated.  Patient said she would.  Patient just called and said she left the ER and wanted to make an appointment with Dr.Bedsole.  Dr.Bedsole's schedule is full today and she was told we could schedule her with Dr.Copland tomorrow.  Patient said she wanted to wait and see Dr.Bedsole on Thursday.  Patient scheduled appointment on Thursday at 4:00.

## 2015-01-25 NOTE — Telephone Encounter (Signed)
Noted  

## 2015-01-25 NOTE — ED Notes (Signed)
Patient stuck twice for blood draw, continued to move arm and body away from needle while kicking her feet, and would not let needle be advanced. Discussed option of using other vein, but patient did not want to be stuck again at this time.

## 2015-01-25 NOTE — ED Notes (Addendum)
This RN called to room by ED secretary. Upon centering the room pt was sitting up on side of bed. Pt states she wants to leave due to the physician not coming into the room. Pt states "this is a lousy place and I'm not sitting here all damn night". This RN attempted to sway pt decision to leave, pt removed all the monitoring equipment and grabbed her purse stating "They will definetely be hearing about this". This RN apologized for delay and explained the acuity of patients however pt walked out of the door and did not return at this time.

## 2015-01-27 ENCOUNTER — Encounter: Payer: Self-pay | Admitting: Family Medicine

## 2015-01-27 ENCOUNTER — Ambulatory Visit (INDEPENDENT_AMBULATORY_CARE_PROVIDER_SITE_OTHER): Payer: Medicare HMO | Admitting: Family Medicine

## 2015-01-27 ENCOUNTER — Ambulatory Visit (INDEPENDENT_AMBULATORY_CARE_PROVIDER_SITE_OTHER)
Admission: RE | Admit: 2015-01-27 | Discharge: 2015-01-27 | Disposition: A | Discharge status: Home / Self Care | Payer: Medicare HMO | Source: Ambulatory Visit | Attending: Family Medicine | Admitting: Family Medicine

## 2015-01-27 VITALS — BP 134/65 | HR 67 | Temp 97.5°F | Ht 63.5 in | Wt 163.8 lb

## 2015-01-27 DIAGNOSIS — B182 Chronic viral hepatitis C: Secondary | ICD-10-CM

## 2015-01-27 DIAGNOSIS — R630 Anorexia: Secondary | ICD-10-CM | POA: Diagnosis not present

## 2015-01-27 DIAGNOSIS — M25561 Pain in right knee: Secondary | ICD-10-CM

## 2015-01-27 DIAGNOSIS — K7469 Other cirrhosis of liver: Secondary | ICD-10-CM | POA: Diagnosis not present

## 2015-01-27 DIAGNOSIS — R609 Edema, unspecified: Secondary | ICD-10-CM | POA: Diagnosis not present

## 2015-01-27 DIAGNOSIS — R1011 Right upper quadrant pain: Secondary | ICD-10-CM

## 2015-01-27 DIAGNOSIS — M25569 Pain in unspecified knee: Secondary | ICD-10-CM | POA: Diagnosis not present

## 2015-01-27 DIAGNOSIS — R140 Abdominal distension (gaseous): Secondary | ICD-10-CM | POA: Insufficient documentation

## 2015-01-27 DIAGNOSIS — K7460 Unspecified cirrhosis of liver: Secondary | ICD-10-CM | POA: Diagnosis not present

## 2015-01-27 DIAGNOSIS — M25461 Effusion, right knee: Secondary | ICD-10-CM | POA: Diagnosis not present

## 2015-01-27 LAB — CBC WITH DIFFERENTIAL/PLATELET
BASOS PCT: 1 % (ref 0–1)
Basophils Absolute: 0.1 10*3/uL (ref 0.0–0.1)
EOS ABS: 0.1 10*3/uL (ref 0.0–0.7)
EOS PCT: 2 % (ref 0–5)
HCT: 39.2 % (ref 36.0–46.0)
HEMOGLOBIN: 14 g/dL (ref 12.0–15.0)
Lymphocytes Relative: 26 % (ref 12–46)
Lymphs Abs: 1.4 10*3/uL (ref 0.7–4.0)
MCH: 32.3 pg (ref 26.0–34.0)
MCHC: 35.7 g/dL (ref 30.0–36.0)
MCV: 90.5 fL (ref 78.0–100.0)
MONO ABS: 1 10*3/uL (ref 0.1–1.0)
MPV: 11.2 fL (ref 8.6–12.4)
Monocytes Relative: 19 % — ABNORMAL HIGH (ref 3–12)
NEUTROS ABS: 2.8 10*3/uL (ref 1.7–7.7)
Neutrophils Relative %: 52 % (ref 43–77)
PLATELETS: 98 10*3/uL — AB (ref 150–400)
RBC: 4.33 MIL/uL (ref 3.87–5.11)
RDW: 13.7 % (ref 11.5–15.5)
WBC: 5.4 10*3/uL (ref 4.0–10.5)

## 2015-01-27 LAB — COMPREHENSIVE METABOLIC PANEL
ALBUMIN: 3.2 g/dL — AB (ref 3.6–5.1)
ALK PHOS: 74 U/L (ref 33–130)
ALT: 67 U/L — ABNORMAL HIGH (ref 6–29)
AST: 107 U/L — AB (ref 10–35)
BUN: 12 mg/dL (ref 7–25)
CHLORIDE: 103 mmol/L (ref 98–110)
CO2: 24 mmol/L (ref 20–31)
Calcium: 8.5 mg/dL — ABNORMAL LOW (ref 8.6–10.4)
Creat: 0.65 mg/dL (ref 0.50–1.05)
GLUCOSE: 82 mg/dL (ref 65–99)
Potassium: 3.9 mmol/L (ref 3.5–5.3)
SODIUM: 136 mmol/L (ref 135–146)
Total Bilirubin: 1.5 mg/dL — ABNORMAL HIGH (ref 0.2–1.2)
Total Protein: 6.9 g/dL (ref 6.1–8.1)

## 2015-01-27 LAB — LIPASE: LIPASE: 20 U/L (ref 7–60)

## 2015-01-27 LAB — TSH: TSH: 2.572 u[IU]/mL (ref 0.350–4.500)

## 2015-01-27 NOTE — Patient Instructions (Addendum)
Stop at lab on way out.  We will call with X-ray results Stop at front desk to set up abd [place] today or tomorrow.  Go to ER with severe pain or shortness of breath.

## 2015-01-27 NOTE — Addendum Note (Signed)
Addended by: Ellamae Sia on: 01/27/2015 05:19 PM   Modules accepted: Orders

## 2015-01-27 NOTE — Progress Notes (Signed)
Pre visit review using our clinic review tool, if applicable. No additional management support is needed unless otherwise documented below in the visit note. 

## 2015-01-27 NOTE — Progress Notes (Signed)
Subjective:    Patient ID: [name], female    DOB: 1955-07-21, 59 y.o.   MRN: [id number]  HPI  59 year old female  Smoker with chronic hepatitis C , cirrhosis and bipolar disorder presents with multiple symtpoms.  She was recently  Seen at ER on 11/1 for  Joint swelling and abdominal distention, right knee swelling, nausea and decreased appetitie.  She left AMA after multiple attmepts unsuccessfully at lab draw. Never saw ER physician. Reviewed RN notes.  Today she reports multiple issues in the last few  weeks.  1. Swelling in bilateral ankles in last week  2. Right knee swelling, no injury, no change in activity in last week, pain in right knee with walking up steps, throbbing.  No catching, no popping. Pain mainly on right lateral knee. Using  Aleve and heat, help little bit.  No HX of knee problem, no knee surgery  3. Abdominal distension in upper abdomen x several weeks. Comes and goes.  No pain but associated with tightness.  4. Decreased appetite, intermittent nausea: ongoing x  Several week.  5. Mild shortness of breath in last few weeks as well.  She has appt with liver doctor in 03/2014  Last [place] 2013 cirrhotic liver  and spleen size upper limits of normal Hx of hypothyroid    BP Readings from Last 3 Encounters:  01/27/15 134/65  01/25/15 129/68  12/28/14 110/66   Wt Readings from Last 3 Encounters:  01/27/15 163 lb 12 oz (74.277 kg)  01/25/15 149 lb (67.586 kg)  12/28/14 158 lb 8 oz (71.895 kg)     Review of Systems  Constitutional: Positive for appetite change and fatigue. Negative for fever.  HENT: Negative for sore throat.   Respiratory: Positive for shortness of breath.   Gastrointestinal: Positive for nausea. Negative for diarrhea, constipation and blood in stool.  Endocrine: Negative for cold intolerance and heat intolerance.  Genitourinary: Negative for dysuria.  Neurological: Negative for headaches.       Objective:   Physical Exam    Constitutional: Vital signs are normal. She appears well-developed and well-nourished. She is cooperative.  Non-toxic appearance. She does not appear ill. No distress.  HENT:  Head: Normocephalic.  Right Ear: Hearing, tympanic membrane, external ear and ear canal normal. Tympanic membrane is not erythematous, not retracted and not bulging.  Left Ear: Hearing, tympanic membrane, external ear and ear canal normal. Tympanic membrane is not erythematous, not retracted and not bulging.  Nose: No mucosal edema or rhinorrhea. Right sinus exhibits no maxillary sinus tenderness and no frontal sinus tenderness. Left sinus exhibits no maxillary sinus tenderness and no frontal sinus tenderness.  Mouth/Throat: Uvula is midline, oropharynx is clear and moist and mucous membranes are normal.  Eyes: Conjunctivae, EOM and lids are normal. Pupils are equal, round, and reactive to light. Lids are everted and swept, no foreign bodies found.  Neck: Trachea normal and normal range of motion. Neck supple. Carotid bruit is not present. No thyroid mass and no thyromegaly present.  Cardiovascular: Normal rate, regular rhythm, S1 normal, S2 normal, normal heart sounds, intact distal pulses and normal pulses.  PMI is not displaced.  Exam reveals no gallop and no friction rub.   No murmur heard. Bilateral peripheral swelling  bilateral varicose veins.  Pulmonary/Chest: Effort normal and breath sounds normal. No tachypnea. No respiratory distress. She has no decreased breath sounds. She has no wheezes. She has no rhonchi. She has no rales.  Abdominal: Soft. Normal appearance  and bowel sounds are normal. She exhibits distension and fluid wave. There is hepatomegaly. There is no splenomegaly. There is tenderness in the right upper quadrant and epigastric area. There is no rigidity, no rebound, no guarding and no CVA tenderness.  possible  fluid wave, liver appears mildly enlarged on exam Central obesity as well  Neurological:  She is alert.  Skin: Skin is warm, dry and intact. No rash noted.  Psychiatric: Her speech is normal and behavior is normal. Judgment and thought content normal. Her mood appears not anxious. Cognition and memory are normal. She does not exhibit a depressed mood.          Assessment & Plan:   Concern for acute liver failure in setting of cirrhosis and chronic hep C. Pt with possible ascites, enlarged liver as well as peripheral swelling, nausea and decreased appetite. Will eval LFTS/CMET  CBC and urgent abdominal US.  Given RUQ/epigastric abdominal pain: Will eval with lipase as well.  Given history of thyroid issues, this could be cause of peripheral swelling so will re-eval TSH. Check kidney function  Right knee pain: most consistent with osteoparthritis flare. Will X-ray. Hold meds until status of liver and kidney function determined.

## 2015-01-28 ENCOUNTER — Other Ambulatory Visit: Payer: Self-pay | Admitting: Family Medicine

## 2015-01-28 ENCOUNTER — Telehealth: Payer: Self-pay

## 2015-01-28 ENCOUNTER — Ambulatory Visit
Admission: RE | Admit: 2015-01-28 | Discharge: 2015-01-28 | Disposition: A | Discharge status: Home / Self Care | Payer: Medicare HMO | Source: Ambulatory Visit | Attending: Family Medicine | Admitting: Family Medicine

## 2015-01-28 DIAGNOSIS — R188 Other ascites: Secondary | ICD-10-CM | POA: Insufficient documentation

## 2015-01-28 DIAGNOSIS — K7460 Unspecified cirrhosis of liver: Secondary | ICD-10-CM | POA: Diagnosis not present

## 2015-01-28 DIAGNOSIS — R1011 Right upper quadrant pain: Secondary | ICD-10-CM | POA: Insufficient documentation

## 2015-01-28 DIAGNOSIS — R140 Abdominal distension (gaseous): Secondary | ICD-10-CM | POA: Insufficient documentation

## 2015-01-28 MED ORDER — DICLOFENAC SODIUM 75 MG PO TBEC: 75.0000 mg | DELAYED_RELEASE_TABLET | Freq: Two times a day (BID) | ORAL | Status: DC

## 2015-01-28 NOTE — Telephone Encounter (Signed)
PLEASE NOTE: All timestamps contained within this report are represented as [place] Standard Time. CONFIDENTIALTY NOTICE: This fax transmission is intended only for the addressee. It contains information that is legally privileged, confidential or otherwise protected from use or disclosure. If you are not the intended recipient, you are strictly prohibited from reviewing, disclosing, copying using or disseminating any of this information or taking any action in reliance on or regarding this information. If you have received this fax in error, please notify us immediately by telephone so that we can arrange for its return to [place]. Phone: [phone], Toll-Free: [phone], Fax: [phone] Page: 1 of 1 Call Id: [id number] Erma Patient Name: [name] Gender: Female DOB: 20-Apr-1955 Age: 59 Y 11 M 28 D Return Phone Number: [phone] (Primary), [phone] (Secondary) Address: City/State/Zip: [place] Client [facility] - Client Client Site [facility] - Day Physician Diona Browner, Amy Contact Type Fax Call Type Labs Is this call to report lab results? Yes Tech Name Janett Billow Name of [facility] Phone Number [phone] Lab Reference Number [id number] Initial Comment Caller states that she is [facility] with stat results Nurse Assessment Guidelines Guideline Title Affirmed Question Affirmed Notes Nurse Date/Time Eilene Ghazi Time) Disp. Time Eilene Ghazi Time) Disposition Final User 01/27/2015 9:51:11 PM Send to Villanueva, James 01/27/2015 9:56:45 PM Paged On Call back to Call [place], [place]al[place] 01/27/2015 9:58:40 PM Lab Call Completed Yes Erskin [facility]et After [facility] Instructions Given Call Event Type User Date / Time Description Paging [facility] Phone DateTime Result/Outcome Message Type  Notes Renee Pain [phone] 01/27/2015 9:56:45 PM Called On Call Provider - Left Message Doctor Paged Renee Pain 01/27/2015 9:58:28 PM Spoke with On Call - General Message Result

## 2015-01-31 NOTE — Telephone Encounter (Signed)
Pt called requesting results from labs,X-ray and [place]. Read pt the notes from Dr. Diona Browner for labs and xray and instructed that medication was at [facility].  Pt is expecting call from [place] re: [place] results.  Best number [phone] / lt

## 2015-01-31 NOTE — Telephone Encounter (Signed)
Records refaxed to [phone] Attn: Lattie Haw.

## 2015-01-31 NOTE — Telephone Encounter (Signed)
Pt called. Stated Dr. Ronne Binning office at [facility] has not received records yet. Can you please refax to [place].  Thanks

## 2015-01-31 NOTE — Telephone Encounter (Signed)
See result notes.  Results given to patient via telephone.  Records faxed to Dr. Sanjuana Letters at [facility].

## 2015-02-02 ENCOUNTER — Telehealth: Payer: Self-pay | Admitting: Family Medicine

## 2015-02-02 DIAGNOSIS — M25561 Pain in right knee: Secondary | ICD-10-CM

## 2015-02-02 NOTE — Telephone Encounter (Signed)
Called patient.  She has not been to the ED.  She states breathing has improved.  C/o epigastric "pressure" and swelling, and decreased appetite.  Recommended evaluation.  Patient declines OV or ED visit at this time.  States she will call back to schedule if new or worsening symptoms.

## 2015-02-02 NOTE — Telephone Encounter (Signed)
[place] Patient Name: [name] DOB: Apr 06, 1955 Initial Comment Caller states has enlarged spleen, having trouble breathing Nurse Assessment Nurse: Mechele Dawley, RN, Amy Date/Time (Eastern Time): 02/02/2015 11:45:36 AM Confirm and document reason for call. If symptomatic, describe symptoms. ---SHE IS HAVING SOME DIFFICULTY BREATHING. SHE IS HAVING SOME PRESSURE ON HER STOMACH AND DUE TO THE SWELLING SHE IS HAVING SOME DIFFICULTY BREATHING. SHE STATES THE MD TOLD HER THAT SHE HAS AN ENLARGED SPLEEN. SHE STATES SHE WAS HAVING SOME KNEE PROBLEMS AND HER STOMACH WAS GETTING ENLARGED. SHE STATES SHE HD XRAYS ON KNEES. SHE HAD [place] AND FOUND OUT LARGE SPLEEN. SHE STATES SHE HAS PAPERWORK SENT OVER TO DR. FREE - CIRRHOSIS OF THE LIVER. SHE STATES THAT SHE ALWAYS HAS A YELLOW SKIN TINT ANYWAY. Has the patient traveled out of the country within the last 30 days? ---Not Applicable Does the patient have any new or worsening symptoms? ---Yes Will a triage be completed? ---Yes Related visit to physician within the last 2 weeks? ---Yes Does the PT have any chronic conditions? (i.e. diabetes, asthma, etc.) ---Yes List chronic conditions. ---CIRRHOSIS OF LIVER Guidelines Guideline Title Affirmed Question Affirmed Notes Abdominal Injury [1] Can't take a deep breath BUT [2] no respiratory distress Final Disposition User Go to ED Now [place], Therapist, sports, Amy Comments CALLER STATES THAT SHE DOES NOT KNOW WHAT SHE IS SUPPOSE TO DO IN THE MEANTIME OF WAITING FOR HER APPT IN [place]. Referrals [facility] - ED Disagree/Comply: Comply

## 2015-02-02 NOTE — Telephone Encounter (Signed)
I don't really have any suggestions until she is able to see liver MD. Can she not get appt moved sooner? I think her symptoms are related to her cirrhosis, but no urgent changes. Have her work on healthy eating and regular exercise.

## 2015-02-03 NOTE — Telephone Encounter (Signed)
Left message for Gwynevere to return my call and let me know if she was able to move up her appointment with her liver specialist.

## 2015-02-07 NOTE — Telephone Encounter (Signed)
Spoke with [employer].  She states they did not move up her appointment with the Liver MD but they put her on a cancellation list.  Her appointment is not until 04/13/2015.  She states her abdomen is still swollen with pressure and she is still having the swelling in her knee.  She didn't know if Dr. Diona Browner might want to call Dr. Sanjuana Letters at [phone] to see if that would help get her appointment moved up.

## 2015-02-08 NOTE — Telephone Encounter (Signed)
Notify pt unable to get appt moved sooner. Work on weight loss and healthy eating. Keep appt with Dr. Sanjuana Letters.

## 2015-02-08 NOTE — Telephone Encounter (Signed)
[name] notified as instructed by telephone.  She is agreeable to the orthopedic referral.

## 2015-02-08 NOTE — Telephone Encounter (Signed)
For knee. Recommend referral to [facility] for possible injection in knee for pain.  I will try to contact Dr. Sanjuana Letters.

## 2015-02-08 NOTE — Addendum Note (Signed)
Addended by: Eliezer Lofts E on: 02/08/2015 04:26 PM   Modules accepted: Orders

## 2015-02-09 ENCOUNTER — Other Ambulatory Visit: Payer: Self-pay | Admitting: Family Medicine

## 2015-02-09 NOTE — Telephone Encounter (Signed)
Last office visit 01/27/2015.  Last refilled 01/28/2015 for #30 with no refills.  Ok to refill?

## 2015-02-16 DIAGNOSIS — M2241 Chondromalacia patellae, right knee: Secondary | ICD-10-CM | POA: Diagnosis not present

## 2015-02-25 ENCOUNTER — Other Ambulatory Visit: Payer: Self-pay | Admitting: Family Medicine

## 2015-02-25 NOTE — Telephone Encounter (Signed)
Last office visit 01/27/2015.  Last refilled 02/10/2015 for #30 with 0 refills.  Quantity changed to #60 so we are not having to refill every two weeks.  Ok to refill?

## 2015-03-09 ENCOUNTER — Telehealth: Payer: Self-pay | Admitting: Family Medicine

## 2015-03-09 NOTE — Telephone Encounter (Signed)
Pt called to see if you heard anything back from [place] about getting her in for her stomach swelling. She stated her stomach is not going down it is getting bigger She wanted to know what she needs to do until her appointment 1/19 with dr free in [place]. She called to see if there has been any cancellations

## 2015-03-10 NOTE — Telephone Encounter (Signed)
Last office note and ultrasound faxed to Dr. Maceo Pro asking for recommendations for [name] until her appointment on 04/14/2015.  [name] also notified that I have faxed last office note and ultrasound to Dr. Maceo Pro asking for his recommendations. She states she also has called his office and all they advised was for her to go to the ER.  I also recommended that she call Dr. Delman Kitten office daily to see if they have had any cancellations to see if she can get in earlier.

## 2015-03-10 NOTE — Telephone Encounter (Addendum)
Butch Penny please call her liver MD office... Send them her last OV note here, last [place] in 01/2105 and ask her liver MD to review it to see if there is anything he/she would recommend as pt continues to be bothered by abdominal bloiating. Can they get her in earlier?

## 2015-03-27 HISTORY — PX: OTHER SURGICAL HISTORY: SHX169

## 2015-04-13 DIAGNOSIS — Z79899 Other long term (current) drug therapy: Secondary | ICD-10-CM | POA: Diagnosis not present

## 2015-04-13 DIAGNOSIS — K7460 Unspecified cirrhosis of liver: Secondary | ICD-10-CM | POA: Diagnosis not present

## 2015-04-15 ENCOUNTER — Telehealth: Payer: Self-pay | Admitting: Family Medicine

## 2015-04-15 NOTE — Telephone Encounter (Signed)
Left message asking pt to call office Schedule a lab appointment next week Then send dr Diona Browner this note  unc sent notes wanting pt to have bmp next week after starting back on diuretics this week and fax results to ATTN:  Thayer Ohm RN [phone]

## 2015-04-18 NOTE — Telephone Encounter (Signed)
Patient returned Robin's call and scheduled lab appointment on 04/20/15.

## 2015-04-19 NOTE — Telephone Encounter (Signed)
Agreed -

## 2015-04-20 ENCOUNTER — Other Ambulatory Visit (INDEPENDENT_AMBULATORY_CARE_PROVIDER_SITE_OTHER): Payer: Medicare HMO

## 2015-04-20 DIAGNOSIS — R609 Edema, unspecified: Secondary | ICD-10-CM

## 2015-04-20 LAB — BASIC METABOLIC PANEL
BUN: 14 mg/dL (ref 6–23)
CALCIUM: 8.6 mg/dL (ref 8.4–10.5)
CO2: 28 meq/L (ref 19–32)
CREATININE: 0.71 mg/dL (ref 0.40–1.20)
Chloride: 103 mEq/L (ref 96–112)
GFR: 89.18 mL/min (ref >60.00)
GLUCOSE: 92 mg/dL (ref 70–99)
Potassium: 4.3 mEq/L (ref 3.5–5.1)
SODIUM: 136 meq/L (ref 135–145)

## 2015-04-25 ENCOUNTER — Other Ambulatory Visit: Payer: Self-pay | Admitting: Family Medicine

## 2015-04-25 NOTE — Telephone Encounter (Signed)
Last office visit 01/27/2015.  Last refilled 02/28/2015 for #60 with no refills.  Ok to refill?

## 2015-05-06 DIAGNOSIS — I340 Nonrheumatic mitral (valve) insufficiency: Secondary | ICD-10-CM | POA: Diagnosis not present

## 2015-05-06 DIAGNOSIS — K7460 Unspecified cirrhosis of liver: Secondary | ICD-10-CM | POA: Diagnosis not present

## 2015-05-17 ENCOUNTER — Telehealth: Payer: Self-pay

## 2015-05-17 NOTE — Telephone Encounter (Signed)
Have her increase to 40 mg daily of furosemide for 3 days then return to 20 mg daily. Have pt continue calling [facility] for their recs.  Follow up here if swelling not improving or further weight gain.

## 2015-05-17 NOTE — Telephone Encounter (Signed)
Lizzet notified as instructed by telephone. 

## 2015-05-17 NOTE — Telephone Encounter (Signed)
Pt left v/m; pt is swelling in abdomen, both legs and  rt knee again and med is not helping; No SOB. pt taking spironolactone 25 mg taking one tab bid and furosemide 20 mg taking one tab daily. Pt averages voiding 1 time a day. Pt also called Dr Sanjuana Letters at [facility] today. Pt has not heard back yet from [facility]. today pts weight is 161 lbs. On 05/13/15 weight was 156 lbs. Pt wanted Dr Rometta Emery opinion of what to do. Pt did not want to schedule appt. Last seen 01/27/15 and has CPX scheduled on 06/07/15. [place].

## 2015-05-19 ENCOUNTER — Other Ambulatory Visit: Payer: Self-pay | Admitting: Family Medicine

## 2015-05-19 DIAGNOSIS — K7469 Other cirrhosis of liver: Secondary | ICD-10-CM

## 2015-05-23 ENCOUNTER — Other Ambulatory Visit: Payer: Self-pay | Admitting: Family Medicine

## 2015-05-25 ENCOUNTER — Other Ambulatory Visit: Payer: Medicare HMO

## 2015-05-25 ENCOUNTER — Other Ambulatory Visit (INDEPENDENT_AMBULATORY_CARE_PROVIDER_SITE_OTHER): Payer: Medicare HMO

## 2015-05-25 DIAGNOSIS — K7469 Other cirrhosis of liver: Secondary | ICD-10-CM

## 2015-05-25 DIAGNOSIS — K7460 Unspecified cirrhosis of liver: Secondary | ICD-10-CM | POA: Diagnosis not present

## 2015-05-25 LAB — COMPREHENSIVE METABOLIC PANEL
ALBUMIN: 3.5 g/dL (ref 3.5–5.2)
ALK PHOS: 73 U/L (ref 39–117)
ALT: 80 U/L — ABNORMAL HIGH (ref 0–35)
AST: 111 U/L — AB (ref 0–37)
BUN: 18 mg/dL (ref 6–23)
CALCIUM: 8.9 mg/dL (ref 8.4–10.5)
CHLORIDE: 102 meq/L (ref 96–112)
CO2: 27 mEq/L (ref 19–32)
Creatinine, Ser: 0.76 mg/dL (ref 0.40–1.20)
GFR: 82.42 mL/min (ref >60.00)
Glucose, Bld: 82 mg/dL (ref 70–99)
POTASSIUM: 4.4 meq/L (ref 3.5–5.1)
Sodium: 134 mEq/L — ABNORMAL LOW (ref 135–145)
Total Bilirubin: 1.1 mg/dL (ref 0.2–1.2)
Total Protein: 7.9 g/dL (ref 6.0–8.3)

## 2015-05-26 LAB — SODIUM, URINE, RANDOM: Sodium, Ur: 99 mmol/L (ref 28–272)

## 2015-05-27 ENCOUNTER — Other Ambulatory Visit: Payer: Self-pay | Admitting: Family Medicine

## 2015-05-27 ENCOUNTER — Telehealth: Payer: Self-pay

## 2015-05-27 ENCOUNTER — Telehealth: Payer: Self-pay | Admitting: Family Medicine

## 2015-05-27 NOTE — Telephone Encounter (Signed)
Pt called back and spoke with [place] who transferred pt to [facility] and she transferred call to me; when I answered the call the pt was on the line and I asked how I could help her; she wanted to know if she had to repeat everything; I advised pt I was speaking with her earlier and she said she would have to call me back and ended call. Now talking with pt and I asked her to tell me how I could help her. Pt said she was trying to talk with Dr Diona Browner and since she could not she was going to hang up. Pt hung up and I called pt back and asked her if her problem was an emergency or how could I help her and pt stated she did not want to talk with me and she would cb and talk to someone else.

## 2015-05-27 NOTE — Telephone Encounter (Signed)
[facility]  Patient Name: [name]  DOB: Aug 05, 1955    Initial Comment Caller states a DR she sees on top of DR. Bedsole prescribed her medications - after she took the first pill - hands started looking like she had them in water for a long time and she felt really cold. Harvani 400 mg 1 pill a day & Rivavirin 3 a day 600 mg   Nurse Assessment  Nurse: Justine Null, RN, Rodena Piety Date/Time (Eastern Time): 05/27/2015 4:22:06 PM  Confirm and document reason for call. If symptomatic, describe symptoms. You must click the next button to save text entered. ---Caller states a DR she sees on top of DR. Bedsole prescribed her medications - after she took the first pill - hands started looking like she had them in water for a long time and she felt really cold. Harvani 400 mg 1 pill a day & Rivavirin 3 a day 600 mg caller stated that she has been diagnosed with Hepatitis C and has been given all of her medications and has taken the first pills last night at 0430 PM and has been having her hands to shrivel up and then she has had some clenching of her fingers and has had some cramps and then she has been having some itching and has tried to call the MD who gave her medications but has not been able to get in touch with the MD  Has the patient traveled out of the country within the last 30 days? ---No  Does the patient have any new or worsening symptoms? ---Yes  Will a triage be completed? ---Yes  Related visit to physician within the last 2 weeks? ---No  Does the PT have any chronic conditions? (i.e. diabetes, asthma, etc.) ---Yes  List chronic conditions. ---Hepatitis C cirrhosis liver bipolar disorder PTSD enlarged spleen  Is this a behavioral health or substance abuse call? ---No     Guidelines    Guideline Title Affirmed Question Affirmed Notes  Itching - Widespread [1] MODERATE-SEVERE widespread itching (i.e., interferes  with sleep, normal activities or school) AND [2] not improved after 24 hours of itching Care Advice    Final Disposition User   See Physician within 24 Hours Rowe, RN, Rodena Piety    Comments

## 2015-05-27 NOTE — Telephone Encounter (Signed)
See 05/27/15 phone note.

## 2015-05-27 NOTE — Telephone Encounter (Signed)
Reviewed note. Tried to call pt.. No answer. Left message for pt to call back. I rec stopping med until she can speak with liver MD about Side effects.

## 2015-05-27 NOTE — Telephone Encounter (Signed)
Called pt to discuss new meds causing SE. No answer to [phone].

## 2015-05-27 NOTE — Telephone Encounter (Signed)
Pt left v/m requesting cb. Spoke with pt and asked how I could help her; pt said she would have to cb and ended call.

## 2015-05-27 NOTE — Telephone Encounter (Signed)
Last office visit 01/27/2015.  Last refilled 04/25/2015 for #60 with no refills.  Ok to refill?

## 2015-05-28 ENCOUNTER — Other Ambulatory Visit: Payer: Self-pay | Admitting: Family Medicine

## 2015-05-30 NOTE — Telephone Encounter (Signed)
Israa notified as instructed by telephone.  She states she has spoken with her liver specialist and they advised for her to stay on the medication.

## 2015-05-30 NOTE — Telephone Encounter (Signed)
Left message for Liliann to return my call

## 2015-06-02 ENCOUNTER — Telehealth: Payer: Self-pay | Admitting: Family Medicine

## 2015-06-02 DIAGNOSIS — Z13220 Encounter for screening for lipoid disorders: Secondary | ICD-10-CM

## 2015-06-02 DIAGNOSIS — E890 Postprocedural hypothyroidism: Secondary | ICD-10-CM

## 2015-06-02 NOTE — Telephone Encounter (Signed)
-----   Message from Ellamae Sia sent at 05/24/2015  2:28 PM EST ----- Regarding: Lab orders for Friday, 3.10.17  AWV lab orders, please.

## 2015-06-03 ENCOUNTER — Other Ambulatory Visit (INDEPENDENT_AMBULATORY_CARE_PROVIDER_SITE_OTHER): Payer: Medicare HMO

## 2015-06-03 DIAGNOSIS — Z13220 Encounter for screening for lipoid disorders: Secondary | ICD-10-CM

## 2015-06-03 DIAGNOSIS — E890 Postprocedural hypothyroidism: Secondary | ICD-10-CM

## 2015-06-03 LAB — LIPID PANEL
Cholesterol: 163 mg/dL (ref 0–200)
HDL: 55.2 mg/dL (ref >39.00)
LDL Cholesterol: 95 mg/dL (ref 0–99)
NONHDL: 108.14
Total CHOL/HDL Ratio: 3
Triglycerides: 68 mg/dL (ref 0.0–149.0)
VLDL: 13.6 mg/dL (ref 0.0–40.0)

## 2015-06-03 LAB — T4, FREE: FREE T4: 1.54 ng/dL (ref 0.60–1.60)

## 2015-06-03 LAB — TSH: TSH: 0.1 u[IU]/mL — AB (ref 0.35–4.50)

## 2015-06-03 LAB — T3, FREE: T3 FREE: 2.7 pg/mL (ref 2.3–4.2)

## 2015-06-07 ENCOUNTER — Encounter: Payer: Self-pay | Admitting: Family Medicine

## 2015-06-07 ENCOUNTER — Other Ambulatory Visit (HOSPITAL_COMMUNITY)
Admission: RE | Admit: 2015-06-07 | Discharge: 2015-06-07 | Disposition: A | Discharge status: Home / Self Care | Payer: Medicare HMO | Source: Ambulatory Visit | Attending: Family Medicine | Admitting: Family Medicine

## 2015-06-07 ENCOUNTER — Ambulatory Visit (INDEPENDENT_AMBULATORY_CARE_PROVIDER_SITE_OTHER): Payer: Medicare HMO | Admitting: Family Medicine

## 2015-06-07 VITALS — BP 100/64 | HR 78 | Temp 97.8°F | Ht 63.0 in | Wt 151.0 lb

## 2015-06-07 DIAGNOSIS — K1379 Other lesions of oral mucosa: Secondary | ICD-10-CM | POA: Diagnosis not present

## 2015-06-07 DIAGNOSIS — F17200 Nicotine dependence, unspecified, uncomplicated: Secondary | ICD-10-CM

## 2015-06-07 DIAGNOSIS — B182 Chronic viral hepatitis C: Secondary | ICD-10-CM

## 2015-06-07 DIAGNOSIS — Z01419 Encounter for gynecological examination (general) (routine) without abnormal findings: Secondary | ICD-10-CM | POA: Diagnosis not present

## 2015-06-07 DIAGNOSIS — R7303 Prediabetes: Secondary | ICD-10-CM | POA: Diagnosis not present

## 2015-06-07 DIAGNOSIS — E890 Postprocedural hypothyroidism: Secondary | ICD-10-CM | POA: Diagnosis not present

## 2015-06-07 DIAGNOSIS — Z01411 Encounter for gynecological examination (general) (routine) with abnormal findings: Secondary | ICD-10-CM | POA: Diagnosis present

## 2015-06-07 DIAGNOSIS — K5909 Other constipation: Secondary | ICD-10-CM

## 2015-06-07 DIAGNOSIS — Z7189 Other specified counseling: Secondary | ICD-10-CM

## 2015-06-07 DIAGNOSIS — R87810 Cervical high risk human papillomavirus (HPV) DNA test positive: Secondary | ICD-10-CM | POA: Insufficient documentation

## 2015-06-07 DIAGNOSIS — Z0000 Encounter for general adult medical examination without abnormal findings: Secondary | ICD-10-CM

## 2015-06-07 DIAGNOSIS — Z1151 Encounter for screening for human papillomavirus (HPV): Secondary | ICD-10-CM | POA: Diagnosis present

## 2015-06-07 DIAGNOSIS — K7469 Other cirrhosis of liver: Secondary | ICD-10-CM

## 2015-06-07 DIAGNOSIS — Z124 Encounter for screening for malignant neoplasm of cervix: Secondary | ICD-10-CM

## 2015-06-07 DIAGNOSIS — Z720 Tobacco use: Secondary | ICD-10-CM

## 2015-06-07 LAB — CBC WITH DIFFERENTIAL/PLATELET
Basophils Absolute: 0 10*3/uL (ref 0.0–0.1)
Basophils Relative: 0.9 % (ref 0.0–3.0)
EOS PCT: 3.8 % (ref 0.0–5.0)
Eosinophils Absolute: 0.1 10*3/uL (ref 0.0–0.7)
HEMATOCRIT: 43.5 % (ref 36.0–46.0)
HEMOGLOBIN: 14.9 g/dL (ref 12.0–15.0)
LYMPHS ABS: 1.3 10*3/uL (ref 0.7–4.0)
LYMPHS PCT: 37.4 % (ref 12.0–46.0)
MCHC: 34.3 g/dL (ref 30.0–36.0)
MCV: 93.1 fl (ref 78.0–100.0)
Monocytes Absolute: 2 10*3/uL — ABNORMAL HIGH (ref 0.1–1.0)
NEUTROS PCT: 2.7 % — AB (ref 43.0–77.0)
Neutro Abs: 0.1 10*3/uL — ABNORMAL LOW (ref 1.4–7.7)
Platelets: 80 10*3/uL — ABNORMAL LOW (ref 150.0–400.0)
RBC: 4.67 Mil/uL (ref 3.87–5.11)
RDW: 14.3 % (ref 11.5–15.5)
WBC: 3.5 10*3/uL — ABNORMAL LOW (ref 4.0–10.5)

## 2015-06-07 MED ORDER — VARENICLINE TARTRATE 0.5 MG X 11 & 1 MG X 42 PO MISC: ORAL | Status: DC

## 2015-06-07 MED ORDER — VARENICLINE TARTRATE 1 MG PO TABS: 1.0000 mg | ORAL_TABLET | Freq: Two times a day (BID) | ORAL | Status: DC

## 2015-06-07 NOTE — Progress Notes (Signed)
I have personally reviewed the Medicare Annual Wellness questionnaire and have noted 1. The patient's medical and social history 2. Their use of alcohol, tobacco or illicit drugs 3. Their current medications and supplements 4. The patient's functional ability including ADL's, fall risks, home safety risks and hearing or visual             impairment. 5. Diet and physical activities 6. Evidence for depression or mood disorders 7.         Updated provider list Cognitive evaluation was performed and recorded on pt medicare questionnaire form. The patients weight, height, BMI and visual acuity have been recorded in the chart  I have made referrals, counseling and provided education to the patient based review of the above and I have provided the pt with a written personalized care plan for preventive services.   She has been constipated on Harvoni in last 1-2 weeks: taking miralax and 2 laxatives. Only small hard BMs.  Increase stress.. Father lost his wife. Caring father more now. Sister with upcoming surgery.  Cholesterol at goal on no med. Lab Results  Component Value Date   CHOL 163 06/03/2015   HDL 55.20 06/03/2015   LDLCALC 95 06/03/2015   TRIG 68.0 06/03/2015   CHOLHDL 3 06/03/2015  She has been walking 3 days a week 30-60 min. Diet: Poor diet.  Prediabetes : Glucose in nml range.  Hypothyroid: TSH, but nml free t3 and free t4 Lab Results  Component Value Date   TSH 0.10* 06/03/2015   Increased LFTs: Secondary to cirrhosis, hep C. Last saw [place]  In last month, now on  HArvoni....  Wt Readings from Last 3 Encounters:  06/07/15 151 lb (68.493 kg)  01/27/15 163 lb 12 oz (74.277 kg)  01/25/15 149 lb (67.586 kg)  She does have some bleeding in gums/tounge, dry mouth. occuring more often in last month, even prior to starting harvoni. Has low platelets in history. Bipolar disorder.. Stable on latuda.. Followed by psych.  Review of Systems  Constitutional:  Negative for fever and fatigue.  HENT: Negative for ear pain.  Eyes: Negative for pain.  Respiratory: Negative for chest tightness and shortness of breath.  Cardiovascular: Negative for chest pain, palpitations and leg swelling.  Gastrointestinal: Negative for abdominal pain. She has noted more bloating in abdomen lately. Feeling full sooner. Genitourinary: Negative for dysuria.      Objective:  Physical Exam  Constitutional: Vital signs are normal. She appears well-developed and well-nourished. She is cooperative. Non-toxic appearance. She does not appear ill. No distress.  HENT:  Head: Normocephalic.  Right Ear: Hearing, tympanic membrane, external ear and ear canal normal.  Left Ear: Hearing, tympanic membrane, external ear and ear canal normal.  Nose: Nose normal.  Eyes: Conjunctivae, EOM and lids are normal. Pupils are equal, round, and reactive to light. Lids are everted and swept, no foreign bodies found.  Neck: Trachea normal and normal range of motion. Neck supple. Carotid bruit is not present. No mass and no thyromegaly present.  Cardiovascular: Normal rate, regular rhythm, S1 normal, S2 normal, normal heart sounds and intact distal pulses. Exam reveals no gallop.  No murmur heard. Pulmonary/Chest: Effort normal and breath sounds normal. No respiratory distress. She has no wheezes. She has no rhonchi. She has no rales.  Abdominal: Soft. Mild distention and bowel sounds are normal. She exhibits no distension, no fluid wave, no abdominal bruit and no mass. There is no splenomegaly, mild hepatomegaly.. There is no tenderness. There  is no rebound, no guarding and no CVA tenderness. No hernia.  Genitourinary: No breast swelling, tenderness, discharge or bleeding.  Normal introitus for age, no external lesions, no vaginal discharge, mucosa pink and moist, no vaginal or cervical lesions, no vaginal atrophy, no friaility or hemorrhage, normal uterus size and  position, no adnexal masses or tenderness.  Chaperoned exam.    Lymphadenopathy:   She has no cervical adenopathy.  She has no axillary adenopathy.  Neurological: She is alert. She has normal strength. No cranial nerve deficit or sensory deficit.  Skin: Skin is warm, dry and intact. No rash noted.  Psychiatric: Her speech is normal and behavior is normal. Judgment normal. Her mood appears not anxious. Cognition and memory are normal. She does not exhibit a depressed mood.      Assessment & Plan:  The patient's preventative maintenance and recommended screening tests for an annual wellness exam were reviewed in full today. Brought up to date unless services declined.  Counselled on the importance of diet, exercise, and its role in overall health and mortality. The patient's FH and SH was reviewed, including their home life, tobacco status, and drug and alcohol status.   Vaccines: Due for tdap,flu up to date with hep A abd B. She refuses today. Colon: Has scheduled colonoscopy. Mammo: 05/2014, plan every 2 years PAP/DVE: last pap 3 years ago, due this year DEXA: no family history of osteoporosis known smoker: 30 years but 1/2 pack per day, trying to quit.  Interested in trail of chantix.She has used patch and gum in past  Refused HIV testing.

## 2015-06-07 NOTE — Progress Notes (Signed)
Pre visit review using our clinic review tool, if applicable. No additional management support is needed unless otherwise documented below in the visit note. 

## 2015-06-07 NOTE — Patient Instructions (Addendum)
Make sure to drink water, increase fiber in diet. Milk of magnesia.. Over the counter for constipation for several days until bowel movement.  If still no BM.Marland Kitchen Do an enema. Get back to healthy low fat low carb high veggie diet. Stop at lab on way out for cbc.

## 2015-06-07 NOTE — Assessment & Plan Note (Signed)
Pt still does not have set up. Info reviewed 2017

## 2015-06-07 NOTE — Assessment & Plan Note (Signed)
Likely secondary to low platelets but will re-eval for worsening.

## 2015-06-07 NOTE — Assessment & Plan Note (Signed)
Due to Riva.  MOM, fiber info and water recommended.

## 2015-06-07 NOTE — Addendum Note (Signed)
Addended by: Pilar Grammes on: 06/07/2015 11:23 AM   Modules accepted: Orders

## 2015-06-07 NOTE — Assessment & Plan Note (Signed)
Resolved. Encouraged exercise, weight loss, healthy eating habits.  

## 2015-06-07 NOTE — Assessment & Plan Note (Signed)
Counseled on smoking cessation. Started on chantix, reviewed smoking cessation techniques.

## 2015-06-07 NOTE — Addendum Note (Signed)
Addended by: Daralene Milch C on: 06/07/2015 11:21 AM   Modules accepted: SmartSet

## 2015-06-07 NOTE — Addendum Note (Signed)
Addended by: Daralene Milch C on: 06/07/2015 11:56 AM   Modules accepted: Orders, SmartSet

## 2015-06-07 NOTE — Assessment & Plan Note (Signed)
Stable on no med. 

## 2015-06-07 NOTE — Assessment & Plan Note (Signed)
Now on harvoni. Followed by GI.

## 2015-06-07 NOTE — Assessment & Plan Note (Signed)
Followed by GI at [facility]. LFTs stable.

## 2015-06-09 LAB — CYTOLOGY - PAP

## 2015-06-23 DIAGNOSIS — B1920 Unspecified viral hepatitis C without hepatic coma: Secondary | ICD-10-CM | POA: Diagnosis not present

## 2015-06-23 DIAGNOSIS — K7469 Other cirrhosis of liver: Secondary | ICD-10-CM | POA: Diagnosis not present

## 2015-06-28 DIAGNOSIS — K3189 Other diseases of stomach and duodenum: Secondary | ICD-10-CM | POA: Diagnosis not present

## 2015-06-28 DIAGNOSIS — I8510 Secondary esophageal varices without bleeding: Secondary | ICD-10-CM | POA: Diagnosis not present

## 2015-06-28 DIAGNOSIS — K219 Gastro-esophageal reflux disease without esophagitis: Secondary | ICD-10-CM | POA: Diagnosis not present

## 2015-06-28 DIAGNOSIS — Z8673 Personal history of transient ischemic attack (TIA), and cerebral infarction without residual deficits: Secondary | ICD-10-CM | POA: Diagnosis not present

## 2015-06-28 DIAGNOSIS — K766 Portal hypertension: Secondary | ICD-10-CM | POA: Diagnosis not present

## 2015-06-28 DIAGNOSIS — Z886 Allergy status to analgesic agent status: Secondary | ICD-10-CM | POA: Diagnosis not present

## 2015-06-28 DIAGNOSIS — K7460 Unspecified cirrhosis of liver: Secondary | ICD-10-CM | POA: Diagnosis not present

## 2015-06-28 DIAGNOSIS — E039 Hypothyroidism, unspecified: Secondary | ICD-10-CM | POA: Diagnosis not present

## 2015-06-28 DIAGNOSIS — Z880 Allergy status to penicillin: Secondary | ICD-10-CM | POA: Diagnosis not present

## 2015-06-28 DIAGNOSIS — R69 Illness, unspecified: Secondary | ICD-10-CM | POA: Diagnosis not present

## 2015-06-28 DIAGNOSIS — I8500 Esophageal varices without bleeding: Secondary | ICD-10-CM | POA: Diagnosis not present

## 2015-07-12 ENCOUNTER — Telehealth: Payer: Self-pay

## 2015-07-12 NOTE — Telephone Encounter (Signed)
Agree, unless pt getting worse with severe shortness of breath.Faythe Ghee to keep appt tommorow.

## 2015-07-12 NOTE — Telephone Encounter (Signed)
Pt left v/m; pt did not call 911 because cannot afford $800.00 ride to hospital; pt has crackling in chest and wants appt to see Dr Diona Browner. Unable to reach pt by phone. Pt already has appt with Dr Lorelei Pont on 07/13/15 at 2 pm.

## 2015-07-12 NOTE — Telephone Encounter (Signed)
I would feel more comfortable sending a copy of this note to Dr. Jacinto Reap - I do not know the patient. To me, it does not look like she is in end stage liver failure, and I think waiting to see me tomorrow is reasonable.

## 2015-07-12 NOTE — Telephone Encounter (Signed)
PLEASE NOTE: All timestamps contained within this report are represented as [place] Standard Time. CONFIDENTIALTY NOTICE: This fax transmission is intended only for the addressee. It contains information that is legally privileged, confidential or otherwise protected from use or disclosure. If you are not the intended recipient, you are strictly prohibited from reviewing, disclosing, copying using or disseminating any of this information or taking any action in reliance on or regarding this information. If you have received this fax in error, please notify us immediately by telephone so that we can arrange for its return to [place]. Phone: [phone], Toll-Free: [phone], Fax: [phone] Page: 1 of 2 Call Id: [id number] [place] Patient Name: [name] Gender: Female DOB: 06/23/1955 Age: 60 Y 75 M 11 D Return Phone Number: [phone] (Primary), [id number] (Secondary) Address: City/State/Zip: [place] Client [place] Night - Client Client Site [place] Physician Diona Browner, Amy Contact Type Call Who Is Calling Patient / Member / Family / Caregiver Call Type Triage / Clinical Relationship To Patient Self Return Phone Number [phone] (Primary) Chief Complaint BREATHING - shortness of breath or sounds breathless Reason for Call Symptomatic / Request for [place] states she may have the flu, hard to breathe PreDisposition Home Care Translation No Nurse Assessment Nurse: Windle Guard, RN, Lesa Date/Time (Eastern Time): 07/11/2015 10:40:37 PM Confirm and document reason for call. If symptomatic, describe symptoms. You must click the next button to save text entered. ---Caller states she is having difficulty breathing. She has a productive cough Has the patient traveled out of the country within the last  30 days? ---No Does the patient have any new or worsening symptoms? ---Yes Will a triage be completed? ---Yes Related visit to physician within the last 2 weeks? ---No Does the PT have any chronic conditions? (i.e. diabetes, asthma, etc.) ---Yes List chronic conditions. ---cirrhosis of the liver Is this a behavioral health or substance abuse call? ---No Guidelines Guideline Title Affirmed Question Affirmed Notes Nurse Date/Time Eilene Ghazi Time) Breathing Difficulty Stridor Conner, RN, Lesa 07/11/2015 10:42:55 PM Disp. Time Eilene Ghazi Time) Disposition Final User 07/11/2015 10:39:13 PM Send to Urgent Queue [place] November 07/11/2015 10:46:04 PM Send To RN Personal Conner, RN, Lesa 07/11/2015 10:56:04 PM 911 Outcome Documentation Conner, RN, Emmaline Kluver Reason: Unable to reach caller 07/11/2015 10:56:16 PM Call Completed Conner, RN, Lesa PLEASE NOTE: All timestamps contained within this report are represented as [place] Standard Time. CONFIDENTIALTY NOTICE: This fax transmission is intended only for the addressee. It contains information that is legally privileged, confidential or otherwise protected from use or disclosure. If you are not the intended recipient, you are strictly prohibited from reviewing, disclosing, copying using or disseminating any of this information or taking any action in reliance on or regarding this information. If you have received this fax in error, please notify us immediately by telephone so that we can arrange for its return to [place]. Phone: [phone], Toll-Free: [phone], Fax: [phone] Page: 2 of 2 Call Id: [id number] 07/11/2015 10:45:15 PM Call EMS 911 Now Yes Conner, RN, Candyce Churn Understands: Yes Disagree/Comply: Comply Care Advice Given Per Guideline CALL EMS 911 NOW: Immediate medical attention is needed. You need to hang up and call 911 (or an ambulance). Psychologist, forensic Discretion: I'll call you back in a few minutes to be sure you were able to reach them.) CARE  ADVICE given per Breathing Difficulty (Adult) guideline.

## 2015-07-12 NOTE — Telephone Encounter (Signed)
Unable to reach pt by phone to get f/u info on pt.

## 2015-07-13 ENCOUNTER — Ambulatory Visit: Payer: Medicare HMO | Admitting: Family Medicine

## 2015-07-13 NOTE — Telephone Encounter (Signed)
Noted. I never saw patient. We would have been happy to see her.

## 2015-07-13 NOTE — Telephone Encounter (Signed)
Pt was being checked in by [facility] and LT.  Stated the copay was $10.00, pt was looking in purse, then just shut it and loudly stated "forget it".   I followed pt out of waiting room and after outside said that we could bill her and that we would definitely see her today.  Pt shouted she would just go somewhere else. Pt got in truck and left parking lot quickly.

## 2015-07-28 ENCOUNTER — Encounter: Payer: Self-pay | Admitting: Family Medicine

## 2015-07-28 ENCOUNTER — Ambulatory Visit (INDEPENDENT_AMBULATORY_CARE_PROVIDER_SITE_OTHER): Payer: Medicare HMO | Admitting: Family Medicine

## 2015-07-28 VITALS — BP 103/56 | HR 69 | Temp 98.3°F | Ht 63.0 in | Wt 152.2 lb

## 2015-07-28 DIAGNOSIS — Z720 Tobacco use: Secondary | ICD-10-CM | POA: Diagnosis not present

## 2015-07-28 DIAGNOSIS — J209 Acute bronchitis, unspecified: Secondary | ICD-10-CM

## 2015-07-28 DIAGNOSIS — F17200 Nicotine dependence, unspecified, uncomplicated: Secondary | ICD-10-CM

## 2015-07-28 MED ORDER — AZITHROMYCIN 250 MG PO TABS: ORAL_TABLET | ORAL | Status: DC

## 2015-07-28 MED ORDER — GUAIFENESIN-CODEINE 100-10 MG/5ML PO SYRP: 5.0000 mL | ORAL_SOLUTION | Freq: Every evening | ORAL | Status: DC | PRN

## 2015-07-28 MED ORDER — VARENICLINE TARTRATE 1 MG PO TABS: 1.0000 mg | ORAL_TABLET | Freq: Two times a day (BID) | ORAL | Status: DC

## 2015-07-28 MED ORDER — PREDNISONE 20 MG PO TABS: ORAL_TABLET | ORAL | Status: DC

## 2015-07-28 NOTE — Patient Instructions (Addendum)
Complete antibiotics and steroid taper.  Cough suppressant night as needed. During the day you can use mucinex DM.  Call if not improving as expected in next 4-5 days, go to ER if severe shortness of breath.  Quit smoking finally with Chantix.

## 2015-07-28 NOTE — Assessment & Plan Note (Signed)
Concern for COPD given smoking history. Pt will return for eval with PFTs when current likely exacerbation is improved. Treat with antibiotics and steroid taper.

## 2015-07-28 NOTE — Progress Notes (Signed)
Pre visit review using our clinic review tool, if applicable. No additional management support is needed unless otherwise documented below in the visit note. 

## 2015-07-28 NOTE — Progress Notes (Signed)
   Subjective:    Patient ID: [name], female    DOB: 26-Nov-1955, 60 y.o.   MRN: [id number]  Cough This is a new problem. The current episode started 1 to 4 weeks ago. The problem has been unchanged. The cough is productive of purulent sputum. Associated symptoms include chest pain, a sore throat, shortness of breath and wheezing. Pertinent negatives include no ear pain, fever, headaches, myalgias, nasal congestion, postnasal drip or rash. Associated symptoms comments: Chest pain since she has been sick, intermittent, nonexertional.. The symptoms are aggravated by lying down (down to 1-2 cigarettes  .). Risk factors for lung disease include smoking/tobacco exposure. She has tried OTC cough suppressant for the symptoms.  Wheezing  Associated symptoms include chest pain, coughing, shortness of breath and a sore throat. Pertinent negatives include no ear pain, fever, headaches or rash.  Chest Pain  Associated symptoms include a cough and shortness of breath. Pertinent negatives include no fever or headaches.    Social History /Family History/Past Medical History reviewed and updated if needed.  Smoker for 50 pack year history. On chantix and down to 1-2 a day.  NO COPD.Marland Kitchen No in a while.  Review of Systems  Constitutional: Negative for fever.  HENT: Positive for sore throat. Negative for ear pain and postnasal drip.   Respiratory: Positive for cough, shortness of breath and wheezing.   Cardiovascular: Positive for chest pain.  Musculoskeletal: Negative for myalgias.  Skin: Negative for rash.  Neurological: Negative for headaches.       Objective:   Physical Exam  Constitutional: Vital signs are normal. She appears well-developed and well-nourished. She is cooperative.  Non-toxic appearance. She does not appear ill. No distress.  HENT:  Head: Normocephalic.  Right Ear: Hearing, tympanic membrane, external ear and ear canal normal. Tympanic membrane is not erythematous, not retracted and  not bulging.  Left Ear: Hearing, tympanic membrane, external ear and ear canal normal. Tympanic membrane is not erythematous, not retracted and not bulging.  Nose: Mucosal edema and rhinorrhea present. Right sinus exhibits no maxillary sinus tenderness and no frontal sinus tenderness. Left sinus exhibits no maxillary sinus tenderness and no frontal sinus tenderness.  Mouth/Throat: Uvula is midline, oropharynx is clear and moist and mucous membranes are normal.  Eyes: Conjunctivae, EOM and lids are normal. Pupils are equal, round, and reactive to light. Lids are everted and swept, no foreign bodies found.  Neck: Trachea normal and normal range of motion. Neck supple. Carotid bruit is not present. No thyroid mass and no thyromegaly present.  Cardiovascular: Normal rate, regular rhythm, S1 normal, S2 normal, normal heart sounds, intact distal pulses and normal pulses.  Exam reveals no gallop and no friction rub.   No murmur heard. Pulmonary/Chest: Effort normal. No tachypnea. No respiratory distress. She has decreased breath sounds. She has wheezes. She has no rhonchi. She has no rales.  Neurological: She is alert.  Skin: Skin is warm, dry and intact. No rash noted.  Psychiatric: Her speech is normal and behavior is normal. Judgment normal. Her mood appears not anxious. Cognition and memory are normal. She does not exhibit a depressed mood.          Assessment & Plan:

## 2015-07-28 NOTE — Assessment & Plan Note (Signed)
Pt on chantix and has almost quit smoking. Pt encouraged to smot the last 1-2 cigs a day especially in light of current respiratory status.

## 2015-08-01 ENCOUNTER — Other Ambulatory Visit: Payer: Self-pay | Admitting: Podiatry

## 2015-08-11 ENCOUNTER — Other Ambulatory Visit: Payer: Self-pay | Admitting: Family Medicine

## 2015-08-11 NOTE — Telephone Encounter (Signed)
Last office visit 07/28/2015.  Refill?

## 2015-08-11 NOTE — Telephone Encounter (Signed)
Refused refills.. Pt needs follow for re-eval if not better.

## 2015-08-16 ENCOUNTER — Telehealth: Payer: Self-pay | Admitting: *Deleted

## 2015-08-16 NOTE — Telephone Encounter (Signed)
Received fax request for topical Lidocaine/Prilocaine cream.  Left message for pt to set up an appt to be established with another doctor, and the medication is not refilled at this time.

## 2015-08-26 DIAGNOSIS — K7469 Other cirrhosis of liver: Secondary | ICD-10-CM | POA: Diagnosis not present

## 2015-08-26 DIAGNOSIS — B182 Chronic viral hepatitis C: Secondary | ICD-10-CM | POA: Diagnosis not present

## 2015-08-26 DIAGNOSIS — L299 Pruritus, unspecified: Secondary | ICD-10-CM | POA: Diagnosis not present

## 2015-08-26 DIAGNOSIS — B1920 Unspecified viral hepatitis C without hepatic coma: Secondary | ICD-10-CM | POA: Diagnosis not present

## 2015-08-26 DIAGNOSIS — K5909 Other constipation: Secondary | ICD-10-CM | POA: Diagnosis not present

## 2015-09-08 ENCOUNTER — Ambulatory Visit (INDEPENDENT_AMBULATORY_CARE_PROVIDER_SITE_OTHER): Payer: Medicare HMO | Admitting: Family Medicine

## 2015-09-08 ENCOUNTER — Encounter: Payer: Self-pay | Admitting: Family Medicine

## 2015-09-08 VITALS — BP 103/57 | HR 69 | Temp 97.4°F | Ht 63.0 in | Wt 150.2 lb

## 2015-09-08 DIAGNOSIS — J449 Chronic obstructive pulmonary disease, unspecified: Secondary | ICD-10-CM | POA: Diagnosis not present

## 2015-09-08 DIAGNOSIS — Z720 Tobacco use: Secondary | ICD-10-CM | POA: Diagnosis not present

## 2015-09-08 DIAGNOSIS — F17200 Nicotine dependence, unspecified, uncomplicated: Secondary | ICD-10-CM

## 2015-09-08 MED ORDER — ALBUTEROL SULFATE HFA 108 (90 BASE) MCG/ACT IN AERS: 2.0000 | INHALATION_SPRAY | Freq: Four times a day (QID) | RESPIRATORY_TRACT | Status: DC | PRN

## 2015-09-08 MED ORDER — TIOTROPIUM BROMIDE MONOHYDRATE 18 MCG IN CAPS: 18.0000 ug | ORAL_CAPSULE | Freq: Every day | RESPIRATORY_TRACT | Status: DC

## 2015-09-08 NOTE — Progress Notes (Signed)
Pre visit review using our clinic review tool, if applicable. No additional management support is needed unless otherwise documented below in the visit note. 

## 2015-09-08 NOTE — Assessment & Plan Note (Signed)
Only restriction on spirometry. FEV1/FVC 73%. Given symptoms will try a trial of spiriva daily.  Quit smoking.

## 2015-09-08 NOTE — Progress Notes (Signed)
   Subjective:    Patient ID: [name], female    DOB: Mar 15, 1956, 60 y.o.   MRN: [id number]  HPI  60 year old female presents for 6 week follow up. She was diagnosed with acute bronchitis 07/28/2014.Marland Kitchen Resolved with antibiotics.  Today she reports she still is wheezing and Shortness of breath off and on. Still with AM cough, productive but clear.. At baseline.  She has a 50 pack year history .  She is a current smoker but is down to 1 a day on chantix x 2 months.. She has had an improvement in craving on chantix. No SE.   Review of Systems  Constitutional: Negative for fever and fatigue.  HENT: Negative for ear pain.   Eyes: Negative for pain.  Respiratory: Negative for chest tightness and shortness of breath.   Cardiovascular: Negative for chest pain, palpitations and leg swelling.  Gastrointestinal: Negative for abdominal pain.  Genitourinary: Negative for dysuria.       Objective:   Physical Exam  Constitutional: Vital signs are normal. She appears well-developed and well-nourished. She is cooperative.  Non-toxic appearance. She does not appear ill. No distress.  HENT:  Head: Normocephalic.  Right Ear: Hearing, tympanic membrane, external ear and ear canal normal. Tympanic membrane is not erythematous, not retracted and not bulging.  Left Ear: Hearing, tympanic membrane, external ear and ear canal normal. Tympanic membrane is not erythematous, not retracted and not bulging.  Nose: No mucosal edema or rhinorrhea. Right sinus exhibits no maxillary sinus tenderness and no frontal sinus tenderness. Left sinus exhibits no maxillary sinus tenderness and no frontal sinus tenderness.  Mouth/Throat: Uvula is midline, oropharynx is clear and moist and mucous membranes are normal.  Eyes: Conjunctivae, EOM and lids are normal. Pupils are equal, round, and reactive to light. Lids are everted and swept, no foreign bodies found.  Neck: Trachea normal and normal range of motion. Neck supple.  Carotid bruit is not present. No thyroid mass and no thyromegaly present.  Cardiovascular: Normal rate, regular rhythm, S1 normal, S2 normal, normal heart sounds, intact distal pulses and normal pulses.  Exam reveals no gallop and no friction rub.   No murmur heard. Pulmonary/Chest: Effort normal and breath sounds normal. No tachypnea. No respiratory distress. She has no decreased breath sounds. She has no wheezes. She has no rhonchi. She has no rales.  Rare wheeze  Abdominal: Soft. Normal appearance and bowel sounds are normal. There is no tenderness.  Neurological: She is alert.  Skin: Skin is warm, dry and intact. No rash noted.  Psychiatric: Her speech is normal and behavior is normal. Judgment and thought content normal. Her mood appears not anxious. Cognition and memory are normal. She does not exhibit a depressed mood.          Assessment & Plan:

## 2015-09-08 NOTE — Patient Instructions (Signed)
Start daily spiriva.  Can use albuterol inhaler as needed for shortness of breath and wheeze rescue.  Quit smoking all the way on chantix.

## 2015-09-30 DIAGNOSIS — K7460 Unspecified cirrhosis of liver: Secondary | ICD-10-CM | POA: Diagnosis not present

## 2015-09-30 DIAGNOSIS — K7689 Other specified diseases of liver: Secondary | ICD-10-CM | POA: Diagnosis not present

## 2015-09-30 DIAGNOSIS — K7469 Other cirrhosis of liver: Secondary | ICD-10-CM | POA: Diagnosis not present

## 2015-09-30 DIAGNOSIS — K766 Portal hypertension: Secondary | ICD-10-CM | POA: Diagnosis not present

## 2015-09-30 DIAGNOSIS — K740 Hepatic fibrosis: Secondary | ICD-10-CM | POA: Diagnosis not present

## 2015-09-30 DIAGNOSIS — B182 Chronic viral hepatitis C: Secondary | ICD-10-CM | POA: Diagnosis not present

## 2015-10-10 ENCOUNTER — Other Ambulatory Visit: Payer: Self-pay | Admitting: Family Medicine

## 2015-10-10 NOTE — Telephone Encounter (Signed)
Received refill request electronically Medication no longer on med list Last refill 10/12/14 #30/1 Last office visit 09/08/15

## 2015-10-25 ENCOUNTER — Encounter: Payer: Self-pay | Admitting: Family Medicine

## 2015-10-25 ENCOUNTER — Ambulatory Visit (INDEPENDENT_AMBULATORY_CARE_PROVIDER_SITE_OTHER): Payer: Medicare HMO | Admitting: Family Medicine

## 2015-10-25 DIAGNOSIS — B182 Chronic viral hepatitis C: Secondary | ICD-10-CM | POA: Diagnosis not present

## 2015-10-25 DIAGNOSIS — F319 Bipolar disorder, unspecified: Secondary | ICD-10-CM | POA: Diagnosis not present

## 2015-10-25 DIAGNOSIS — F5231 Female orgasmic disorder: Secondary | ICD-10-CM | POA: Diagnosis not present

## 2015-10-25 DIAGNOSIS — Z62810 Personal history of physical and sexual abuse in childhood: Secondary | ICD-10-CM

## 2015-10-25 NOTE — Progress Notes (Signed)
Pre visit review using our clinic review tool, if applicable. No additional management support is needed unless otherwise documented below in the visit note. 
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
## 2015-10-25 NOTE — Assessment & Plan Note (Signed)
Initially felt possible to SE of SSRI meds, but pt able to achieve orgasm with masturbation.  Appears most likely psychologically associated. Counseled pt on methods to work around issue. Offered formal counseling.. She refused at this time.

## 2015-10-25 NOTE — Assessment & Plan Note (Signed)
Well controlled on current meds.. Recommend against stopping current meds despite possible SE risk.

## 2015-10-25 NOTE — Assessment & Plan Note (Signed)
Reviewed contagiousness of hep C and recommended pt to  Notify sexual partners (both oral and vaginal/rectal) of her diagnosis and to use condoms.

## 2015-10-27 ENCOUNTER — Telehealth: Payer: Self-pay | Admitting: Family Medicine

## 2015-10-27 NOTE — Telephone Encounter (Signed)
[facility] Patient Name: [name] DOB: 03-02-1956 Initial Comment Caller states she has poison ivy or oak, and calamine lotion and cortisone nothing seems to be working. Nurse Assessment Nurse: Martyn Ehrich RN, Felicia Date/Time (Eastern Time): 10/27/2015 3:32:24 PM Confirm and document reason for call. If symptomatic, describe symptoms. You must click the next button to save text entered. ---PT has poison ivy or poison oak she was outside. It is on ankle L arm R wrist and face onset 2 d ago. No fever. Has the patient traveled out of the country within the last 30 days? ---No Does the patient have any new or worsening symptoms? ---Colbert Ewing a triage be completed? ---Yes Related visit to physician within the last 2 weeks? ---No Does the PT have any chronic conditions? (i.e. diabetes, asthma, etc.) ---No Is this a behavioral health or substance abuse call? ---No Guidelines Guideline Title Affirmed Question Affirmed Notes Poison Karlene Einstein - Oak - [place] SEVERE itching (e.g., interferes with sleep or normal activities) Final Disposition User See Physician within 24 Hours [place], RN, Leland Comments she has liver cirrhosis, hep C, psychiatric issues that are undercontrol (bipolar PTSD) she is going to MD at [employer]. attempted to give her UC info in case the liver specialitswont treat her poison ivy tom. She wants to call us back early afternoon tom if she cant be treated by liver MD at [place] for poison ivy Referrals [place] UNDECIDED Disagree/Comply: Comply Call Id: [phone]

## 2015-10-28 DIAGNOSIS — B182 Chronic viral hepatitis C: Secondary | ICD-10-CM | POA: Diagnosis not present

## 2015-10-28 DIAGNOSIS — K7460 Unspecified cirrhosis of liver: Secondary | ICD-10-CM | POA: Diagnosis not present

## 2015-10-28 DIAGNOSIS — R21 Rash and other nonspecific skin eruption: Secondary | ICD-10-CM | POA: Diagnosis not present

## 2015-12-08 DIAGNOSIS — M2580 Other specified joint disorders, unspecified joint: Secondary | ICD-10-CM | POA: Diagnosis not present

## 2015-12-08 DIAGNOSIS — M898X9 Other specified disorders of bone, unspecified site: Secondary | ICD-10-CM | POA: Diagnosis not present

## 2015-12-08 DIAGNOSIS — M7752 Other enthesopathy of left foot: Secondary | ICD-10-CM | POA: Diagnosis not present

## 2015-12-08 DIAGNOSIS — L851 Acquired keratosis [keratoderma] palmaris et plantaris: Secondary | ICD-10-CM | POA: Diagnosis not present

## 2015-12-09 ENCOUNTER — Ambulatory Visit: Payer: Medicare HMO | Admitting: Family Medicine

## 2015-12-14 ENCOUNTER — Other Ambulatory Visit: Payer: Self-pay | Admitting: Family Medicine

## 2015-12-30 DIAGNOSIS — H2513 Age-related nuclear cataract, bilateral: Secondary | ICD-10-CM | POA: Diagnosis not present

## 2016-01-05 DIAGNOSIS — M2580 Other specified joint disorders, unspecified joint: Secondary | ICD-10-CM | POA: Diagnosis not present

## 2016-01-05 DIAGNOSIS — M7752 Other enthesopathy of left foot: Secondary | ICD-10-CM | POA: Diagnosis not present

## 2016-01-05 DIAGNOSIS — M76822 Posterior tibial tendinitis, left leg: Secondary | ICD-10-CM | POA: Diagnosis not present

## 2016-01-12 ENCOUNTER — Ambulatory Visit (INDEPENDENT_AMBULATORY_CARE_PROVIDER_SITE_OTHER): Payer: Medicare HMO | Admitting: Family Medicine

## 2016-01-12 ENCOUNTER — Encounter: Payer: Self-pay | Admitting: Family Medicine

## 2016-01-12 VITALS — BP 101/53 | HR 68 | Temp 97.5°F | Ht 63.0 in | Wt 149.8 lb

## 2016-01-12 DIAGNOSIS — K7689 Other specified diseases of liver: Secondary | ICD-10-CM

## 2016-01-12 DIAGNOSIS — K7469 Other cirrhosis of liver: Secondary | ICD-10-CM | POA: Diagnosis not present

## 2016-01-12 DIAGNOSIS — B1920 Unspecified viral hepatitis C without hepatic coma: Secondary | ICD-10-CM | POA: Diagnosis not present

## 2016-01-12 DIAGNOSIS — B182 Chronic viral hepatitis C: Secondary | ICD-10-CM | POA: Diagnosis not present

## 2016-01-12 LAB — COMPREHENSIVE METABOLIC PANEL
ALBUMIN: 3.9 g/dL (ref 3.5–5.2)
ALK PHOS: 65 U/L (ref 39–117)
ALT: 14 U/L (ref 0–35)
AST: 25 U/L (ref 0–37)
BILIRUBIN TOTAL: 1 mg/dL (ref 0.2–1.2)
BUN: 16 mg/dL (ref 6–23)
CALCIUM: 8.9 mg/dL (ref 8.4–10.5)
CO2: 29 meq/L (ref 19–32)
CREATININE: 0.87 mg/dL (ref 0.40–1.20)
Chloride: 102 mEq/L (ref 96–112)
GFR: 70.37 mL/min (ref >60.00)
Glucose, Bld: 89 mg/dL (ref 70–99)
Potassium: 4.5 mEq/L (ref 3.5–5.1)
Sodium: 136 mEq/L (ref 135–145)
TOTAL PROTEIN: 7.6 g/dL (ref 6.0–8.3)

## 2016-01-12 NOTE — Assessment & Plan Note (Signed)
Due for repeat MRI per [facility]. Note reviewed.Need cr BUN prior to repeat MRI.

## 2016-01-12 NOTE — Progress Notes (Signed)
Pre visit review using our clinic review tool, if applicable. No additional management support is needed unless otherwise documented below in the visit note. 

## 2016-01-12 NOTE — Patient Instructions (Signed)
Stop at lab on way out.  Great job with healthy lifestyle changes.

## 2016-01-12 NOTE — Progress Notes (Signed)
   Subjective:    Patient ID: [name], female    DOB: 1955/05/07, 60 y.o.   MRN: [id number]  HPI    60 year old female presents prior to MRI  of liver, to follow up spots seen in past.. She request lab work prior for eval of kidney function.    Last OV with hepatologist at [facility]  10/28/2015  PA cardona, Dr. Maceo Pro  Well compensated hep C cirrhosis She states she completed 12 weeks of Harvoni and Ribavirin 600mg  everyday and was adherent with the treatment. MRI 09/30/15 Multiple subcentimeter hyperenhancing foci in the late arterial phase feeding to background liver on delayed postcontrast images, which may represent dysplastic nodules or vascular shunts (LI-RADS 3). Attention on 3-6 months follow-up. A 2.2 cm lesion with high T1 signal and without enhancement in hepatic segment 7. (LIRADS 3) This may represent a regenerative nodule    She is going to the gym 5 days a week.  Body mass index is 26.53 kg/m.  Wt Readings from Last 3 Encounters:  01/12/16 149 lb 12 oz (67.9 kg)  10/25/15 147 lb 4 oz (66.8 kg)  09/08/15 150 lb 4 oz (68.2 kg)     Review of Systems  Constitutional: Negative for fatigue and fever.  HENT: Negative for ear pain.   Eyes: Negative for pain.  Respiratory: Negative for chest tightness and shortness of breath.   Cardiovascular: Negative for chest pain, palpitations and leg swelling.  Gastrointestinal: Negative for abdominal pain.  Genitourinary: Negative for dysuria.       Objective:   Physical Exam  Constitutional: Vital signs are normal. She appears well-developed and well-nourished. She is cooperative.  Non-toxic appearance. She does not appear ill. No distress.  HENT:  Head: Normocephalic.  Right Ear: Hearing, tympanic membrane, external ear and ear canal normal. Tympanic membrane is not erythematous, not retracted and not bulging.  Left Ear: Hearing, tympanic membrane, external ear and ear canal normal. Tympanic membrane is not erythematous, not  retracted and not bulging.  Nose: No mucosal edema or rhinorrhea. Right sinus exhibits no maxillary sinus tenderness and no frontal sinus tenderness. Left sinus exhibits no maxillary sinus tenderness and no frontal sinus tenderness.  Mouth/Throat: Uvula is midline, oropharynx is clear and moist and mucous membranes are normal.  Eyes: Conjunctivae, EOM and lids are normal. Pupils are equal, round, and reactive to light. Lids are everted and swept, no foreign bodies found.  Neck: Trachea normal and normal range of motion. Neck supple. Carotid bruit is not present. No thyroid mass and no thyromegaly present.  Cardiovascular: Normal rate, regular rhythm, S1 normal, S2 normal, normal heart sounds, intact distal pulses and normal pulses.  Exam reveals no gallop and no friction rub.   No murmur heard. Pulmonary/Chest: Effort normal and breath sounds normal. No tachypnea. No respiratory distress. She has no decreased breath sounds. She has no wheezes. She has no rhonchi. She has no rales.  Abdominal: Soft. Normal appearance and bowel sounds are normal. There is no tenderness.  Neurological: She is alert.  Skin: Skin is warm, dry and intact. No rash noted.  Psychiatric: Her speech is normal and behavior is normal. Judgment and thought content normal. Her mood appears not anxious. Cognition and memory are normal. She does not exhibit a depressed mood.          Assessment & Plan:

## 2016-01-12 NOTE — Assessment & Plan Note (Addendum)
She is doing well. Hep C in remisison at last check at [facility]. Note revirewed.  needs labs today for hepatic re-eval.

## 2016-01-18 ENCOUNTER — Ambulatory Visit: Payer: Medicare HMO

## 2016-02-02 ENCOUNTER — Ambulatory Visit (INDEPENDENT_AMBULATORY_CARE_PROVIDER_SITE_OTHER): Payer: Medicare HMO

## 2016-02-02 DIAGNOSIS — Z23 Encounter for immunization: Secondary | ICD-10-CM | POA: Diagnosis not present

## 2016-02-24 DIAGNOSIS — R69 Illness, unspecified: Secondary | ICD-10-CM | POA: Diagnosis not present

## 2016-02-24 DIAGNOSIS — F310 Bipolar disorder, current episode hypomanic: Secondary | ICD-10-CM | POA: Diagnosis not present

## 2016-03-09 DIAGNOSIS — F310 Bipolar disorder, current episode hypomanic: Secondary | ICD-10-CM | POA: Diagnosis not present

## 2016-03-09 DIAGNOSIS — R69 Illness, unspecified: Secondary | ICD-10-CM | POA: Diagnosis not present

## 2016-03-28 DIAGNOSIS — B182 Chronic viral hepatitis C: Secondary | ICD-10-CM | POA: Diagnosis not present

## 2016-03-28 DIAGNOSIS — K7460 Unspecified cirrhosis of liver: Secondary | ICD-10-CM | POA: Diagnosis not present

## 2016-04-06 DIAGNOSIS — R69 Illness, unspecified: Secondary | ICD-10-CM | POA: Diagnosis not present

## 2016-04-17 DIAGNOSIS — I864 Gastric varices: Secondary | ICD-10-CM | POA: Diagnosis not present

## 2016-04-17 DIAGNOSIS — K766 Portal hypertension: Secondary | ICD-10-CM | POA: Diagnosis not present

## 2016-04-17 DIAGNOSIS — K7460 Unspecified cirrhosis of liver: Secondary | ICD-10-CM | POA: Diagnosis not present

## 2016-04-17 DIAGNOSIS — R161 Splenomegaly, not elsewhere classified: Secondary | ICD-10-CM | POA: Diagnosis not present

## 2016-04-17 DIAGNOSIS — K7469 Other cirrhosis of liver: Secondary | ICD-10-CM | POA: Diagnosis not present

## 2016-04-17 DIAGNOSIS — R918 Other nonspecific abnormal finding of lung field: Secondary | ICD-10-CM | POA: Diagnosis not present

## 2016-04-17 DIAGNOSIS — C220 Liver cell carcinoma: Secondary | ICD-10-CM | POA: Diagnosis not present

## 2016-04-20 DIAGNOSIS — R69 Illness, unspecified: Secondary | ICD-10-CM | POA: Diagnosis not present

## 2016-04-27 ENCOUNTER — Ambulatory Visit (INDEPENDENT_AMBULATORY_CARE_PROVIDER_SITE_OTHER): Payer: Medicare HMO | Admitting: Family Medicine

## 2016-04-27 ENCOUNTER — Encounter: Payer: Self-pay | Admitting: Family Medicine

## 2016-04-27 VITALS — BP 110/60 | HR 67 | Temp 97.4°F | Ht 63.0 in | Wt 155.8 lb

## 2016-04-27 DIAGNOSIS — R609 Edema, unspecified: Secondary | ICD-10-CM | POA: Diagnosis not present

## 2016-04-27 DIAGNOSIS — R0789 Other chest pain: Secondary | ICD-10-CM | POA: Diagnosis not present

## 2016-04-27 DIAGNOSIS — K7469 Other cirrhosis of liver: Secondary | ICD-10-CM

## 2016-04-27 DIAGNOSIS — B1920 Unspecified viral hepatitis C without hepatic coma: Secondary | ICD-10-CM

## 2016-04-27 DIAGNOSIS — B182 Chronic viral hepatitis C: Secondary | ICD-10-CM | POA: Diagnosis not present

## 2016-04-27 DIAGNOSIS — R1084 Generalized abdominal pain: Secondary | ICD-10-CM

## 2016-04-27 LAB — POC URINALSYSI DIPSTICK (AUTOMATED)
BILIRUBIN UA: NEGATIVE
Blood, UA: NEGATIVE
GLUCOSE UA: NEGATIVE
KETONES UA: NEGATIVE
LEUKOCYTES UA: NEGATIVE
NITRITE UA: NEGATIVE
PH UA: 6.5
Protein, UA: NEGATIVE
Spec Grav, UA: 1.015
Urobilinogen, UA: 0.2

## 2016-04-27 LAB — BASIC METABOLIC PANEL
BUN: 18 mg/dL (ref 6–23)
CALCIUM: 9.2 mg/dL (ref 8.4–10.5)
CO2: 33 meq/L — AB (ref 19–32)
CREATININE: 0.97 mg/dL (ref 0.40–1.20)
Chloride: 100 mEq/L (ref 96–112)
GFR: 62 mL/min (ref >60.00)
Glucose, Bld: 75 mg/dL (ref 70–99)
Potassium: 4.7 mEq/L (ref 3.5–5.1)
Sodium: 134 mEq/L — ABNORMAL LOW (ref 135–145)

## 2016-04-27 LAB — CBC WITH DIFFERENTIAL/PLATELET
BASOS PCT: 1 % (ref 0.0–3.0)
Basophils Absolute: 0.1 10*3/uL (ref 0.0–0.1)
EOS PCT: 1.4 % (ref 0.0–5.0)
Eosinophils Absolute: 0.1 10*3/uL (ref 0.0–0.7)
HEMATOCRIT: 47.9 % — AB (ref 36.0–46.0)
Hemoglobin: 16.5 g/dL — ABNORMAL HIGH (ref 12.0–15.0)
LYMPHS ABS: 1 10*3/uL (ref 0.7–4.0)
LYMPHS PCT: 13.5 % (ref 12.0–46.0)
MCHC: 34.6 g/dL (ref 30.0–36.0)
MCV: 94.2 fl (ref 78.0–100.0)
MONOS PCT: 15.1 % — AB (ref 3.0–12.0)
Monocytes Absolute: 1.1 10*3/uL — ABNORMAL HIGH (ref 0.1–1.0)
NEUTROS ABS: 5.2 10*3/uL (ref 1.4–7.7)
NEUTROS PCT: 69 % (ref 43.0–77.0)
PLATELETS: 89 10*3/uL — AB (ref 150.0–400.0)
RBC: 5.08 Mil/uL (ref 3.87–5.11)
RDW: 13.6 % (ref 11.5–15.5)
WBC: 7.6 10*3/uL (ref 4.0–10.5)

## 2016-04-27 LAB — HEPATIC FUNCTION PANEL
ALK PHOS: 87 U/L (ref 39–117)
ALT: 19 U/L (ref 0–35)
AST: 30 U/L (ref 0–37)
Albumin: 4.4 g/dL (ref 3.5–5.2)
BILIRUBIN DIRECT: 0.3 mg/dL (ref 0.0–0.3)
BILIRUBIN TOTAL: 1.2 mg/dL (ref 0.2–1.2)
TOTAL PROTEIN: 8.6 g/dL — AB (ref 6.0–8.3)

## 2016-04-27 LAB — PROTIME-INR
INR: 1.1 ratio — ABNORMAL HIGH (ref 0.8–1.0)
Prothrombin Time: 11.2 s (ref 9.6–13.1)

## 2016-04-27 LAB — LIPASE: LIPASE: 39 U/L (ref 11.0–59.0)

## 2016-04-27 NOTE — Patient Instructions (Signed)
Stop at lab on way out. We will call with the results.  We will fax the lab results to Dr. Jeris Penta when they return.

## 2016-04-27 NOTE — Progress Notes (Signed)
Subjective:    Patient ID: [name], female    DOB: [birth year], 61 y.o.   MRN: [id number]  HPI  61 year old female pt with hep C cirrhosis presents with worsened peripheral edema, increase in abdominal bloating and weight gain 10 lbs since 04/11/2016. Symptoms atarted all of a sudden. She is short of breath at rest and when she is walking and feels like an elephant is sitting on her chest in last 2 weeks. No cough, no congestion, no fever. Has noted some epigastric pain.    She is on lasix 40 mg daily.  She is taking spirnolactone 100 mg daily No new meds, no change in meds. She does drink very little now.. Only  1-2 beers in month.    She is followed by [facility] hepatology for chronic hep c cirrhosis  Last OV 1/3 reviewed. Pt called MD  Dr. Jeris Penta at [facility] yesterday.. They were supposed to send lab request for [place] to do to further evaluate.  04/17/2016 MRI : unremarkable  HEPATOBILIARY: The liver is cirrhotic with a nodular contour and left hepatic hypertrophy. Reticular T2 hyperintense signal and enhancement predominantly in the dome and left hepatic lobe secondary to fibrosis.  The following focal hepatic lesions are identified, all of which are LI-RADS 3: --Unchanged 2 cm T1 hyperintense nodule in hepatic segment 8 without enhancement or washout (9:22). --Unchanged size of a 0.9 cm arterially hyperenhancing nodule in hepatic segment 2 without washout (11:27). --Unchanged size of a 0.6 cm arterially hyperenhancing nodule in hepatic segment 5 without washout (11:63). --Unchanged punctate focus of arterial hyperenhancement in hepatic segment 3 without washout (11:34).  Mild intrahepatic biliary ductal dilatation in the left hepatic lobe is similar to prior. No extrahepatic biliary ductal dilatation. Gallbladder is unremarkable.   PANCREAS: Unremarkable. SPLEEN: Mild splenomegaly measuring up to 13.4 cm in maximum craniocaudal dimension. Multiple siderotic nodules. ADRENAL GLANDS:  Unremarkable. KIDNEYS/URETERS: Subcentimeter bilateral renal cysts. BOWEL/PERITONEUM/RETROPERITONEUM: No bowel obstruction. No acute inflammatory process. No ascites. VASCULATURE:Abdominal aorta is patent and normal in caliber.Mesenteric and renal arteries are patent.Portal, splenic, and superior mesenteric veins are patent. Esophageal and gastrohepatic varices. Hepatic veins are patent.IVC normal in caliber. Circumaortic left renal vein. LYMPH NODES: No adenopathy.  BONES/SOFT TISSUES: Unremarkable.  ECHO: 04/2015 nml EF  Social History /Family History/Past Medical History reviewed and updated if needed.  Review of Systems  Constitutional: Negative for fatigue and fever.  HENT: Negative for ear pain.   Eyes: Negative for pain.  Respiratory: Negative for chest tightness and shortness of breath.   Cardiovascular: Negative for chest pain, palpitations and leg swelling.  Gastrointestinal: Positive for abdominal distention, abdominal pain and nausea.  Genitourinary: Negative for dysuria.       Objective:   Physical Exam  Constitutional: Vital signs are normal. She appears well-developed and well-nourished. She is cooperative.  Non-toxic appearance. She does not appear ill. No distress.  HENT:  Head: Normocephalic.  Right Ear: Hearing, tympanic membrane, external ear and ear canal normal. Tympanic membrane is not erythematous, not retracted and not bulging.  Left Ear: Hearing, tympanic membrane, external ear and ear canal normal. Tympanic membrane is not erythematous, not retracted and not bulging.  Nose: No mucosal edema or rhinorrhea. Right sinus exhibits no maxillary sinus tenderness and no frontal sinus tenderness. Left sinus exhibits no maxillary sinus tenderness and no frontal sinus tenderness.  Mouth/Throat: Uvula is midline, oropharynx is clear and moist and mucous membranes are normal.  Eyes: Conjunctivae, EOM and lids are normal. Pupils  are equal, round, and reactive to  light. Lids are everted and swept, no foreign bodies found.  Neck: Trachea normal and normal range of motion. Neck supple. Carotid bruit is not present. No thyroid mass and no thyromegaly present.  Cardiovascular: Normal rate, regular rhythm, S1 normal, S2 normal, normal heart sounds, intact distal pulses and normal pulses.  Exam reveals no gallop and no friction rub.   No murmur heard. Pulmonary/Chest: Effort normal and breath sounds normal. No tachypnea. No respiratory distress. She has no decreased breath sounds. She has no wheezes. She has no rhonchi. She has no rales.  Abdominal: Soft. Normal appearance and bowel sounds are normal. She exhibits distension. She exhibits no shifting dullness, no pulsatile liver, no fluid wave and no abdominal bruit. There is hepatosplenomegaly. There is generalized tenderness. There is no CVA tenderness.  Neurological: She is alert.  Skin: Skin is warm, dry and intact. No rash noted.  Psychiatric: Her speech is normal and behavior is normal. Judgment and thought content normal. Her mood appears not anxious. Cognition and memory are normal. She does not exhibit a depressed mood.          Assessment & Plan:

## 2016-04-27 NOTE — Progress Notes (Signed)
Pre visit review using our clinic review tool, if applicable. No additional management support is needed unless otherwise documented below in the visit note. 

## 2016-04-30 ENCOUNTER — Telehealth: Payer: Self-pay | Admitting: Family Medicine

## 2016-04-30 NOTE — Telephone Encounter (Addendum)
[name] notified by telephone that her labs showed no clear reason for current symptoms except for liver and spleen enlargment as seen of MRI done at [facility].     Will forward results to Dr. Lesia Hausen at [facility] for their recommendations. Labs faxed to     [phone].

## 2016-04-30 NOTE — Telephone Encounter (Signed)
Called patient twice and a gentleman answers and says it is the wrong phone number.

## 2016-04-30 NOTE — Telephone Encounter (Signed)
Patient called to get the results of her lab work.

## 2016-04-30 NOTE — Telephone Encounter (Signed)
Pt returned your call about labs  Pt phone number is [phone]

## 2016-05-01 LAB — HEPATITIS C RNA QUANTITATIVE
HCV QUANT LOG: NOT DETECTED {Log_IU}/mL
HCV Quantitative: <15 IU/mL

## 2016-05-03 DIAGNOSIS — R69 Illness, unspecified: Secondary | ICD-10-CM | POA: Diagnosis not present

## 2016-05-04 DIAGNOSIS — R69 Illness, unspecified: Secondary | ICD-10-CM | POA: Diagnosis not present

## 2016-05-04 DIAGNOSIS — F310 Bipolar disorder, current episode hypomanic: Secondary | ICD-10-CM | POA: Diagnosis not present

## 2016-05-16 ENCOUNTER — Telehealth: Payer: Self-pay | Admitting: *Deleted

## 2016-05-16 NOTE — Telephone Encounter (Signed)
Spiriva approved from 03/24/2016 through 03/25/2017.  [place] notified of approval via fax.

## 2016-05-16 NOTE — Telephone Encounter (Signed)
Received fax from [facility] requesting PA for Spiriva.  PA completed on CoverMyMeds.   Awaiting decision.

## 2016-05-18 DIAGNOSIS — F310 Bipolar disorder, current episode hypomanic: Secondary | ICD-10-CM | POA: Diagnosis not present

## 2016-05-18 DIAGNOSIS — R69 Illness, unspecified: Secondary | ICD-10-CM | POA: Diagnosis not present

## 2016-05-29 NOTE — Assessment & Plan Note (Signed)
Possible connection of symptoms with worsening control?

## 2016-05-29 NOTE — Assessment & Plan Note (Addendum)
Eval further with labs. MRI  Unremarkable. No ascites seen on MRI  Likely due to hepatomegaly and abd distension.

## 2016-05-29 NOTE — Assessment & Plan Note (Signed)
No clear cardiac source. Likely due to abdominal distention.

## 2016-05-31 DIAGNOSIS — R69 Illness, unspecified: Secondary | ICD-10-CM | POA: Diagnosis not present

## 2016-06-11 ENCOUNTER — Other Ambulatory Visit: Payer: Medicare HMO

## 2016-06-11 ENCOUNTER — Telehealth: Payer: Self-pay | Admitting: Family Medicine

## 2016-06-11 ENCOUNTER — Ambulatory Visit: Payer: Medicare HMO

## 2016-06-11 DIAGNOSIS — R7303 Prediabetes: Secondary | ICD-10-CM

## 2016-06-11 DIAGNOSIS — E890 Postprocedural hypothyroidism: Secondary | ICD-10-CM

## 2016-06-11 NOTE — Telephone Encounter (Signed)
-----   Message from Eustace Pen, LPN sent at 4/64/3142  4:36 PM EDT ----- Regarding: Labs 06/11/16 Please place lab orders. Thank you.

## 2016-06-13 ENCOUNTER — Encounter (INDEPENDENT_AMBULATORY_CARE_PROVIDER_SITE_OTHER): Payer: Self-pay

## 2016-06-13 ENCOUNTER — Ambulatory Visit (INDEPENDENT_AMBULATORY_CARE_PROVIDER_SITE_OTHER): Payer: Medicare HMO

## 2016-06-13 VITALS — BP 98/60 | HR 71 | Temp 97.5°F | Ht 63.25 in | Wt 153.8 lb

## 2016-06-13 DIAGNOSIS — R7303 Prediabetes: Secondary | ICD-10-CM

## 2016-06-13 DIAGNOSIS — Z0000 Encounter for general adult medical examination without abnormal findings: Secondary | ICD-10-CM | POA: Diagnosis not present

## 2016-06-13 DIAGNOSIS — E890 Postprocedural hypothyroidism: Secondary | ICD-10-CM

## 2016-06-13 LAB — HEMOGLOBIN A1C: Hgb A1c MFr Bld: 5.4 % (ref 4.6–6.5)

## 2016-06-13 LAB — COMPREHENSIVE METABOLIC PANEL
ALT: 19 U/L (ref 0–35)
AST: 27 U/L (ref 0–37)
Albumin: 3.9 g/dL (ref 3.5–5.2)
Alkaline Phosphatase: 71 U/L (ref 39–117)
BILIRUBIN TOTAL: 1 mg/dL (ref 0.2–1.2)
BUN: 19 mg/dL (ref 6–23)
CHLORIDE: 102 meq/L (ref 96–112)
CO2: 28 meq/L (ref 19–32)
CREATININE: 0.88 mg/dL (ref 0.40–1.20)
Calcium: 9.3 mg/dL (ref 8.4–10.5)
GFR: 69.35 mL/min (ref >60.00)
GLUCOSE: 88 mg/dL (ref 70–99)
Potassium: 4.6 mEq/L (ref 3.5–5.1)
SODIUM: 133 meq/L — AB (ref 135–145)
Total Protein: 7.3 g/dL (ref 6.0–8.3)

## 2016-06-13 LAB — LIPID PANEL
Cholesterol: 151 mg/dL (ref 0–200)
HDL: 51.4 mg/dL (ref >39.00)
LDL CALC: 85 mg/dL (ref 0–99)
NONHDL: 99.34
Total CHOL/HDL Ratio: 3
Triglycerides: 71 mg/dL (ref 0.0–149.0)
VLDL: 14.2 mg/dL (ref 0.0–40.0)

## 2016-06-13 LAB — T3, FREE: T3, Free: 3.3 pg/mL (ref 2.3–4.2)

## 2016-06-13 LAB — TSH: TSH: 0.26 u[IU]/mL — ABNORMAL LOW (ref 0.35–4.50)

## 2016-06-13 LAB — T4, FREE: Free T4: 1.41 ng/dL (ref 0.60–1.60)

## 2016-06-13 NOTE — Progress Notes (Signed)
Subjective:   [name] is a 61 y.o. female who presents for Medicare Annual (Subsequent) preventive examination.  Review of Systems:  N/A Cardiac Risk Factors include: advanced age (>21men, >71 women)     Objective:     Vitals: BP 98/60 (BP Location: Left Arm, Patient Position: Sitting, Cuff Size: Normal)   Pulse 71   Temp 97.5 F (36.4 C) (Oral)   Ht 5' 3.25" (1.607 m) Comment: no shoes  Wt 153 lb 12 oz (69.7 kg)   SpO2 96%   BMI 27.02 kg/m   Body mass index is 27.02 kg/m.   Tobacco History  Smoking Status  . Current Some Day Smoker  . Packs/day: 0.50  . Years: 40.00  . Types: Cigarettes  Smokeless Tobacco  . Former Systems developer  . Types: Chew    Comment: decreasing use 05/30/2011     Ready to quit: No Counseling given: No   Past Medical History:  Diagnosis Date  . Acquired hypothyroidism    thyroid goiter s/p thyroidectomy  . Bipolar 1 disorder ([place]) 1990s  . Chronic hepatitis C (HCC)    genotype 1a  . Compensated HCV cirrhosis ([place])   . Complication of anesthesia    states 15 yrs ago, hard time waking up, states cant breathe when coming out of anesthesia  . Depression    states seen weekly at [facility]  . GERD (gastroesophageal reflux disease)    Hpylori + and treated (2012)  . History of alcohol abuse   . Insomnia   . Migraines   . Portal hypertension ([place])   . Portal hypertensive gastropathy 2013  . Smoker   . Varicose veins    Past Surgical History:  Procedure Laterality Date  . APPENDECTOMY    . DIAGNOSTIC LAPAROSCOPY     15 yrs ago  . ESOPHAGOGASTRODUODENOSCOPY  10/2010   nl esophagus, HH, portal hypertensive gastropathy, erosive gastropathy, Hpylori + (Dr. Sydnee Levans)  . left foot surgery  1990s  . LIVER BIOPSY    . THYROIDECTOMY  05/17/2011   Procedure: TOTAL THYROIDECTOMY;  Surgeon: Earnstine Regal, MD, benign goiter per path report   Family History  Problem Relation Age of Onset  . Heart disease Mother     pacemaker  .  Cancer Father     bladder/melanoma  . Diabetes Father   . Hypertension Father    History  Sexual Activity  . Sexual activity: Yes    Outpatient Encounter Prescriptions as of 06/13/2016  Medication Sig  . albuterol (PROVENTIL HFA;VENTOLIN HFA) 108 (90 Base) MCG/ACT inhaler Inhale 2 puffs into the lungs every 6 (six) hours as needed for wheezing or shortness of breath.  . citalopram (CELEXA) 10 MG tablet   . furosemide (LASIX) 20 MG tablet Take 40 mg by mouth daily.   Marland Kitchen lamoTRIgine (LAMICTAL) 25 MG tablet   . LATUDA 40 MG TABS tablet Take 40 mg by mouth daily with breakfast.   . lidocaine-prilocaine (EMLA) cream Apply 1 application topically as needed.  . meloxicam (MOBIC) 15 MG tablet TAKE ONE TABLET BY MOUTH DAILY AS NEEDEDFOR KNEE PAIN. TAKE WITH FOOD.  Marland Kitchen QUEtiapine (SEROQUEL) 100 MG tablet Take 100 mg by mouth at bedtime.  Marland Kitchen spironolactone (ALDACTONE) 25 MG tablet Take 100 mg by mouth once.   Marland Kitchen SYNTHROID 88 MCG tablet TAKE 1 TABLET BY MOUTH DAILY  . tiotropium (SPIRIVA HANDIHALER) 18 MCG inhalation capsule Place 1 capsule (18 mcg total) into inhaler and inhale daily.  . traZODone (DESYREL)  100 MG tablet Take 100 mg by mouth at bedtime as needed.   . varenicline (CHANTIX CONTINUING MONTH PAK) 1 MG tablet Take 1 tablet (1 mg total) by mouth 2 (two) times daily.   No facility-administered encounter medications on file as of 06/13/2016.     Activities of Daily Living In your present state of health, do you have any difficulty performing the following activities: 06/13/2016  Hearing? Y  Vision? N  Difficulty concentrating or making decisions? Y  Walking or climbing stairs? N  Dressing or bathing? N  Doing errands, shopping? N  Preparing Food and eating ? N  Using the Toilet? N  In the past six months, have you accidently leaked urine? Y  Do you have problems with loss of bowel control? N  Managing your Medications? N  Managing your Finances? N  Housekeeping or managing your  Housekeeping? N  Some recent data might be hidden    Patient Care Team: Jinny Sanders, MD as PCP - General (Family Medicine)    Assessment:     Hearing Screening   125Hz  250Hz  500Hz  1000Hz  2000Hz  3000Hz  4000Hz  6000Hz  8000Hz   Right ear:   40 0 40  40    Left ear:   0 40 40  40    Vision Screening Comments: Last vision exam in Oct 2017 @ [facility]   Exercise Activities and Dietary recommendations Current Exercise Habits: Home exercise routine, Type of exercise: walking, Time (Minutes): 30, Frequency (Times/Week): 4, Weekly Exercise (Minutes/Week): 120, Intensity: Mild, Exercise limited by: None identified  Goals    . Increase physical activity          Starting 06/13/16, I will continue to walk at least 30 min 3-4 days per week in an effort to lose weight.       Fall Risk Fall Risk  06/13/2016 06/07/2015 06/03/2014 05/15/2012  Falls in the past year? Yes Yes Yes Yes  Number falls in past yr: 2 or more 2 or more 2 or more 2 or more  Injury with Fall? Yes No No -  Risk Factor Category  - - High Fall Risk High Fall Risk  Risk for fall due to : - - History of fall(s) -   Depression Screen PHQ 2/9 Scores 06/13/2016 06/07/2015 06/03/2014 05/15/2012  PHQ - 2 Score 4 2 0 1  PHQ- 9 Score 22 20 - -     Cognitive Function MMSE - Mini Mental State Exam 06/13/2016  Orientation to time 5  Orientation to Place 5  Registration 3  Attention/ Calculation 0  Recall 3  Language- name 2 objects 0  Language- repeat 1  Language- follow 3 step command 3  Language- read & follow direction 0  Write a sentence 0  Copy design 0  Total score 20     PLEASE NOTE: A Mini-Cog screen was completed. Maximum score is 20. A value of 0 denotes this part of [place] MMSE was not completed or the patient failed this part of the Mini-Cog screening.   Mini-Cog Screening Orientation to Time - Max 5 pts Orientation to Place - Max 5 pts Registration - Max 3 pts Recall - Max 3 pts Language Repeat - Max  1 pts Language Follow 3 Step Command - Max 3 pts     Immunization History  Administered Date(s) Administered  . Hepatitis A 03/26/1998, 03/27/1999  . Hepatitis B 03/26/1998, 03/27/1999, 03/26/2000  . Influenza,inj,Quad PF,36+ Mos 02/02/2016   Screening Tests Health Maintenance  Topic Date Due  . COLON CANCER SCREENING ANNUAL FOBT  06/13/2017 (Originally 08/19/2013)  . TETANUS/TDAP  06/14/2026 (Originally 01/30/1974)  . MAMMOGRAM  06/21/2016  . PAP SMEAR  06/07/2018  . INFLUENZA VACCINE  Completed  . Hepatitis C Screening  Completed  . HIV Screening  Completed      Plan:     I have personally reviewed and addressed the Medicare Annual Wellness questionnaire and have noted the following in the patient's chart:  A. Medical and social history B. Use of alcohol, tobacco or illicit drugs  C. Current medications and supplements D. Functional ability and status E.  Nutritional status F.  Physical activity G. Advance directives H. List of other physicians I.  Hospitalizations, surgeries, and ER visits in previous 12 months J.  [place] to include hearing, vision, cognitive, depression L. Referrals and appointments - none  In addition, I have reviewed and discussed with patient certain preventive protocols, quality metrics, and best practice recommendations. A written personalized care plan for preventive services as well as general preventive health recommendations were provided to patient.  See attached scanned questionnaire for additional information.   Signed,   Lindell Noe, MHA, BS, LPN Health Coach'

## 2016-06-13 NOTE — Patient Instructions (Addendum)
Ms. Kosar , Thank you for taking time to come for your Medicare Wellness Visit. I appreciate your ongoing commitment to your health goals. Please review the following plan we discussed and let me know if I can assist you in the future.   These are the goals we discussed: Goals    . Increase physical activity          Starting 06/13/16, I will continue to walk at least 30 min 3-4 days per week in an effort to lose weight.        This is a list of the screening recommended for you and due dates:  Health Maintenance  Topic Date Due  . Stool Blood Test  06/13/2017*  . Tetanus Vaccine  06/14/2026*  . Mammogram  06/21/2016  . Pap Smear  06/07/2018  . Flu Shot  Completed  .  Hepatitis C: One time screening is recommended by Center for Disease Control  (CDC) for  adults born from [birth year] through 1966.   Completed  . HIV Screening  Completed  *Topic was postponed. The date shown is not the original due date.   Preventive Care for Adults  A healthy lifestyle and preventive care can promote health and wellness. Preventive health guidelines for adults include the following key practices.  . A routine yearly physical is a good way to check with your health care provider about your health and preventive screening. It is a chance to share any concerns and updates on your health and to receive a thorough exam.  . Visit your dentist for a routine exam and preventive care every 6 months. Brush your teeth twice a day and floss once a day. Good oral hygiene prevents tooth decay and gum disease.  . The frequency of eye exams is based on your age, health, family medical history, use  of contact lenses, and other factors. Follow your health care provider's ecommendations for frequency of eye exams.  . Eat a healthy diet. Foods like vegetables, fruits, whole grains, low-fat dairy products, and lean protein foods contain the nutrients you need without too many calories. Decrease your intake of foods high in  solid fats, added sugars, and salt. Eat the right amount of calories for you. Get information about a proper diet from your health care provider, if necessary.  . Regular physical exercise is one of the most important things you can do for your health. Most adults should get at least 150 minutes of moderate-intensity exercise (any activity that increases your heart rate and causes you to sweat) each week. In addition, most adults need muscle-strengthening exercises on 2 or more days a week.  Silver Sneakers may be a benefit available to you. To determine eligibility, you may visit the website: www.silversneakers.com or contact program at 4507553703 Mon-Fri between 8AM-8PM.   . Maintain a healthy weight. The body mass index (BMI) is a screening tool to identify possible weight problems. It provides an estimate of body fat based on height and weight. Your health care provider can find your BMI and can help you achieve or maintain a healthy weight.   For adults 20 years and older: ? A BMI below 18.5 is considered underweight. ? A BMI of 18.5 to 24.9 is normal. ? A BMI of 25 to 29.9 is considered overweight. ? A BMI of 30 and above is considered obese.   . Maintain normal blood lipids and cholesterol levels by exercising and minimizing your intake of saturated fat. Eat a balanced diet with  plenty of fruit and vegetables. Blood tests for lipids and cholesterol should begin at age 34 and be repeated every 5 years. If your lipid or cholesterol levels are high, you are over 50, or you are at high risk for heart disease, you may need your cholesterol levels checked more frequently. Ongoing high lipid and cholesterol levels should be treated with medicines if diet and exercise are not working.  . If you smoke, find out from your health care provider how to quit. If you do not use tobacco, please do not start.  . If you choose to drink alcohol, please do not consume more than 2 drinks per day. One drink  is considered to be 12 ounces (355 mL) of beer, 5 ounces (148 mL) of wine, or 1.5 ounces (44 mL) of liquor.  . If you are 30-40 years old, ask your health care provider if you should take aspirin to prevent strokes.  . Use sunscreen. Apply sunscreen liberally and repeatedly throughout the day. You should seek shade when your shadow is shorter than you. Protect yourself by wearing long sleeves, pants, a wide-brimmed hat, and sunglasses year round, whenever you are outdoors.  . Once a month, do a whole body skin exam, using a mirror to look at the skin on your back. Tell your health care provider of new moles, moles that have irregular borders, moles that are larger than a pencil eraser, or moles that have changed in shape or color.

## 2016-06-13 NOTE — Progress Notes (Signed)
Pre visit review using our clinic review tool, if applicable. No additional management support is needed unless otherwise documented below in the visit note. 
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
## 2016-06-13 NOTE — Progress Notes (Signed)
I reviewed health advisor's note, was available for consultation, and agree with documentation and plan.   Signed,  Willette Mudry T. Angalena Cousineau, MD  

## 2016-06-14 ENCOUNTER — Other Ambulatory Visit (HOSPITAL_COMMUNITY)
Admission: RE | Admit: 2016-06-14 | Discharge: 2016-06-14 | Disposition: A | Discharge status: Home / Self Care | Payer: Medicare HMO | Source: Ambulatory Visit | Attending: Family Medicine | Admitting: Family Medicine

## 2016-06-14 ENCOUNTER — Ambulatory Visit (INDEPENDENT_AMBULATORY_CARE_PROVIDER_SITE_OTHER): Payer: Medicare HMO | Admitting: Family Medicine

## 2016-06-14 ENCOUNTER — Encounter: Payer: Self-pay | Admitting: Family Medicine

## 2016-06-14 ENCOUNTER — Encounter (INDEPENDENT_AMBULATORY_CARE_PROVIDER_SITE_OTHER): Payer: Self-pay

## 2016-06-14 VITALS — BP 96/60 | HR 72 | Temp 97.3°F | Ht 63.25 in | Wt 155.5 lb

## 2016-06-14 DIAGNOSIS — Z124 Encounter for screening for malignant neoplasm of cervix: Secondary | ICD-10-CM | POA: Insufficient documentation

## 2016-06-14 DIAGNOSIS — F319 Bipolar disorder, unspecified: Secondary | ICD-10-CM | POA: Diagnosis not present

## 2016-06-14 DIAGNOSIS — R3981 Functional urinary incontinence: Secondary | ICD-10-CM

## 2016-06-14 DIAGNOSIS — J449 Chronic obstructive pulmonary disease, unspecified: Secondary | ICD-10-CM

## 2016-06-14 DIAGNOSIS — K7469 Other cirrhosis of liver: Secondary | ICD-10-CM | POA: Diagnosis not present

## 2016-06-14 DIAGNOSIS — Z0000 Encounter for general adult medical examination without abnormal findings: Secondary | ICD-10-CM | POA: Diagnosis not present

## 2016-06-14 DIAGNOSIS — B1920 Unspecified viral hepatitis C without hepatic coma: Secondary | ICD-10-CM

## 2016-06-14 DIAGNOSIS — E890 Postprocedural hypothyroidism: Secondary | ICD-10-CM

## 2016-06-14 DIAGNOSIS — R32 Unspecified urinary incontinence: Secondary | ICD-10-CM

## 2016-06-14 DIAGNOSIS — R69 Illness, unspecified: Secondary | ICD-10-CM | POA: Diagnosis not present

## 2016-06-14 LAB — POC URINALSYSI DIPSTICK (AUTOMATED)
Bilirubin, UA: NEGATIVE
GLUCOSE UA: NEGATIVE
Ketones, UA: NEGATIVE
Leukocytes, UA: NEGATIVE
Nitrite, UA: NEGATIVE
PROTEIN UA: NEGATIVE
RBC UA: NEGATIVE
SPEC GRAV UA: 1.03 (ref 1.030–1.035)
Urobilinogen, UA: 0.2 (ref ?–2.0)
pH, UA: 6 (ref 5.0–8.0)

## 2016-06-14 NOTE — Assessment & Plan Note (Signed)
Neg UA. Avoid bladder irritants, empty bladder frequently. If not improving and very bothered follow up for trial of medication use.

## 2016-06-14 NOTE — Progress Notes (Signed)
Subjective:    Patient ID: [name], female    DOB: 12-Feb-1956, 61 y.o.   MRN: [id number]  HPI  The patient is here for annual wellness exam and preventative care.   The patient saw Candis Musa, LPN for medicare wellness. Note reviewed in detail and important notes copied below.  Bipolar DO:  Moderate controlFollowed by psychiatry on celexa, lamictal, latuda and seroquel. Trazodone for sleep.  Chronic hepatitis C, compensated: followed by [facility] hepatitis clinic.  on spironolactone for fluid control.  Mild COPD: Using spiriva. No SOB, no wheeze. She does have intermittant daily cough.  For control and albuterol prn.   Hypothyroid:  On synthroid.. Stable free t3 and free t4 Lab Results  Component Value Date   TSH 0.26 (L) 06/13/2016    Prediabetes:  Lab Results  Component Value Date   HGBA1C 5.4 06/13/2016    Good control cholesterol. No indication for statin. Lab Results  Component Value Date   CHOL 151 06/13/2016   HDL 51.40 06/13/2016   LDLCALC 85 06/13/2016   TRIG 71.0 06/13/2016   CHOLHDL 3 06/13/2016    Exercise: walking 3-4 times a week 30-60 min  Diet: moderate.  Urinary incontinence- several times daily; moderate to large amount; Interferes with daily life slightly; leaks upon sneezing, before she can get to toilet, leaks for no obvious reason, leaks when she is finished urinating and getting dressed  Ongoing in last month.. No dysuria, no fever.  Chronic constipation - patient reports she goes a week at a time without a bowel movement  Decreased appetite - patient reports she often eats once daily   Social History /Family History/Past Medical History reviewed and updated if needed. Blood pressure 96/60, pulse 72, temperature 97.3 F (36.3 C), temperature source Oral, height 5' 3.25" (1.607 m), weight 155 lb 8 oz (70.5 kg).   Review of Systems  Constitutional: Negative for fatigue and fever.  HENT: Negative for congestion.   Eyes: Negative for  pain.  Respiratory: Negative for cough and shortness of breath.   Cardiovascular: Negative for chest pain, palpitations and leg swelling.  Gastrointestinal: Positive for constipation. Negative for abdominal pain.  Genitourinary: Negative for dysuria and vaginal bleeding.  Musculoskeletal: Negative for back pain.  Neurological: Negative for syncope, light-headedness and headaches.  Psychiatric/Behavioral: Negative for dysphoric mood.       Objective:   Physical Exam  Constitutional: Vital signs are normal. She appears well-developed and well-nourished. She is cooperative.  Non-toxic appearance. She does not appear ill. No distress.  HENT:  Head: Normocephalic.  Right Ear: Hearing, tympanic membrane, external ear and ear canal normal.  Left Ear: Hearing, tympanic membrane, external ear and ear canal normal.  Nose: Nose normal.  Eyes: Conjunctivae, EOM and lids are normal. Pupils are equal, round, and reactive to light. Lids are everted and swept, no foreign bodies found.  Neck: Trachea normal and normal range of motion. Neck supple. Carotid bruit is not present. No thyroid mass and no thyromegaly present.  Cardiovascular: Normal rate, regular rhythm, S1 normal, S2 normal, normal heart sounds and intact distal pulses.  Exam reveals no gallop.   No murmur heard. Pulmonary/Chest: Effort normal and breath sounds normal. No respiratory distress. She has no wheezes. She has no rhonchi. She has no rales.  Abdominal: Soft. Normal appearance and bowel sounds are normal. She exhibits no distension, no fluid wave, no abdominal bruit and no mass. There is no hepatosplenomegaly. There is no tenderness. There is no rebound, no guarding  and no CVA tenderness. No hernia.  Genitourinary: Vagina normal and uterus normal. No breast swelling, tenderness, discharge or bleeding. Pelvic exam was performed with patient supine. There is no rash, tenderness or lesion on the right labia. There is no rash, tenderness or  lesion on the left labia. Uterus is not enlarged and not tender. Cervix exhibits no motion tenderness, no discharge and no friability. Right adnexum displays no mass, no tenderness and no fullness. Left adnexum displays no mass, no tenderness and no fullness.  Lymphadenopathy:    She has no cervical adenopathy.    She has no axillary adenopathy.  Neurological: She is alert. She has normal strength. No cranial nerve deficit or sensory deficit.  Skin: Skin is warm, dry and intact. No rash noted.  Psychiatric: Her speech is normal and behavior is normal. Judgment normal. Her mood appears not anxious. Cognition and memory are normal. She does not exhibit a depressed mood.          Assessment & Plan:  The patient's preventative maintenance and recommended screening tests for an annual wellness exam were reviewed in full today. Brought up to date unless services declined.  Counselled on the importance of diet, exercise, and its role in overall health and mortality. The patient's FH and SH was reviewed, including their home life, tobacco status, and drug and alcohol status.   Vaccines: Due for tdap and shingles. Up to date with hep A abd B. She refuses vaccines today. Colon: Plans FOBT.. Given. Mammo: 05/2014, plan every 2 years  PAP/DVE: last pap 05/2015:high risk HPV seen, nml pap. Due now. DEXA: no family history of osteoporosis known, plan starting eval at 40. Smoker: 30 years but 3-4 per day, trying to quit.  She is currently weaning herself. Has tried chantix, gum etc in past.  Refused HIV testing, wants to do soon.

## 2016-06-14 NOTE — Assessment & Plan Note (Signed)
Moderate control on multiple meds .Marland Kitchen Followed by psychiatry.

## 2016-06-14 NOTE — Progress Notes (Signed)
Pre visit review using our clinic review tool, if applicable. No additional management support is needed unless otherwise documented below in the visit note. 

## 2016-06-14 NOTE — Assessment & Plan Note (Signed)
Stable thyroid control.

## 2016-06-14 NOTE — Assessment & Plan Note (Signed)
Followed by [name]. Euvolemic today on spironolactone. Nml LFTs.

## 2016-06-14 NOTE — Patient Instructions (Addendum)
No need for restriction of sodium.  Keep up healthy eating and regular exercise.  Call to schedule mammogram on your own.  Call for shingels vaccine   In 1-2 months.. Shingrix.  Return stool test for colon cancer.  When ready call for lab appt for HIV testing.  Urinary symptoms: Avoid bladder irritants, empty bladder frequently. If not improving and very bothered follow up for trial of medication use.

## 2016-06-14 NOTE — Assessment & Plan Note (Signed)
Improved symptoms on spiriva.  Stop smoking.

## 2016-06-15 ENCOUNTER — Encounter: Payer: Self-pay | Admitting: *Deleted

## 2016-06-15 DIAGNOSIS — R69 Illness, unspecified: Secondary | ICD-10-CM | POA: Diagnosis not present

## 2016-06-15 LAB — CYTOLOGY - PAP
Diagnosis: NEGATIVE
HPV (WINDOPATH): NOT DETECTED

## 2016-06-20 ENCOUNTER — Other Ambulatory Visit: Payer: Self-pay | Admitting: Family Medicine

## 2016-07-06 ENCOUNTER — Ambulatory Visit: Payer: Medicare HMO | Admitting: Family Medicine

## 2016-07-10 ENCOUNTER — Ambulatory Visit: Payer: Medicare HMO

## 2016-07-10 ENCOUNTER — Ambulatory Visit (INDEPENDENT_AMBULATORY_CARE_PROVIDER_SITE_OTHER): Payer: Medicare HMO | Admitting: Sports Medicine

## 2016-07-10 ENCOUNTER — Telehealth: Payer: Self-pay | Admitting: *Deleted

## 2016-07-10 DIAGNOSIS — Q828 Other specified congenital malformations of skin: Secondary | ICD-10-CM

## 2016-07-10 DIAGNOSIS — M79671 Pain in right foot: Secondary | ICD-10-CM

## 2016-07-10 DIAGNOSIS — M79672 Pain in left foot: Secondary | ICD-10-CM | POA: Diagnosis not present

## 2016-07-10 MED ORDER — LIDOCAINE-PRILOCAINE 2.5-2.5 % EX CREA: 1.0000 "application " | TOPICAL_CREAM | CUTANEOUS | 2 refills | Status: DC | PRN
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
## 2016-07-10 NOTE — Telephone Encounter (Addendum)
Pt states she was seen in office today, left name and phone number. Unable to speak with pt the phone line was busy.07/19/2016-Pt states her pharmacy had sent the request for prior authorization and she wanted to know why it had not been taken care of. Pt states she spoke with someone yesterday and they said it had been faxed. I told her is had spoke to someone from our office that had said pt had wanted to know what the prescription was for and I had told them for local anesthesia. I asked D. [place] - pre-cert medications she states she had not received the PA form. Murlean Caller states she will work to [employer] today. Pt states she might as well cancel the appt for next week, because she wouldn't have the medication. Pt continued to states we should get our stories straight and be argumentative, so I told her I would transfer her to reschedule if she would like and I transferred pt to schedulers. 07/20/2016-[employer] faxed approval of pt's EMLA cream. Bethann Humble faxed approval to [facility]. I contacted pt and informed the medication had been approved and we were faxing the approval to [facility], but they should also have received a copy of the approval.

## 2016-07-11 ENCOUNTER — Telehealth: Payer: Self-pay | Admitting: Sports Medicine

## 2016-07-11 NOTE — Telephone Encounter (Signed)
Pt called stating no one called her back yesterday.the medication that was sent in yesterday could not be filled. She said the pharmacy sent a paper for Dr Cannon Kettle to sign. She said it maybe a prior authorization but she is not sure. Please call pt and let her know whant is going on

## 2016-07-11 NOTE — Telephone Encounter (Signed)
I told pt that I had not seen the EMLA rx prior approval, but once arrived we would process. Pt states her pharmacist said he sent it. I told her that may be in just had not reached the PA office but would be started once received. I told pt that she could opt to self-pay, pt states she will just wait.

## 2016-07-12 DIAGNOSIS — R69 Illness, unspecified: Secondary | ICD-10-CM | POA: Diagnosis not present

## 2016-07-13 DIAGNOSIS — R69 Illness, unspecified: Secondary | ICD-10-CM | POA: Diagnosis not present

## 2016-07-20 DIAGNOSIS — R69 Illness, unspecified: Secondary | ICD-10-CM | POA: Diagnosis not present

## 2016-07-24 ENCOUNTER — Ambulatory Visit: Payer: Medicare HMO | Admitting: Sports Medicine

## 2016-07-26 ENCOUNTER — Other Ambulatory Visit: Payer: Self-pay | Admitting: Family Medicine

## 2016-07-26 DIAGNOSIS — Z1231 Encounter for screening mammogram for malignant neoplasm of breast: Secondary | ICD-10-CM

## 2016-08-03 ENCOUNTER — Encounter: Payer: Self-pay | Admitting: Family Medicine

## 2016-08-03 ENCOUNTER — Telehealth: Payer: Self-pay | Admitting: Family Medicine

## 2016-08-03 ENCOUNTER — Ambulatory Visit (INDEPENDENT_AMBULATORY_CARE_PROVIDER_SITE_OTHER): Payer: Medicare HMO | Admitting: Family Medicine

## 2016-08-03 DIAGNOSIS — R42 Dizziness and giddiness: Secondary | ICD-10-CM

## 2016-08-03 DIAGNOSIS — R69 Illness, unspecified: Secondary | ICD-10-CM | POA: Diagnosis not present

## 2016-08-03 NOTE — Telephone Encounter (Signed)
[facility] Patient Name: MAHNOOR MATHISEN DOB: Dec 14, 1955 Initial Comment vertigo type symptoms, nausea off/on for past 2 wks. Nurse Assessment Nurse: Dimas Chyle, RN, Dellis Filbert Date/Time Eilene Ghazi Time): 08/03/2016 8:50:06 AM Confirm and document reason for call. If symptomatic, describe symptoms. ---Vertigo type symptoms, nausea off/on for past 2 wks. Dizziness with activity. Does the patient have any new or worsening symptoms? ---Yes Will a triage be completed? ---Yes Related visit to physician within the last 2 weeks? ---No Does the PT have any chronic conditions? (i.e. diabetes, asthma, etc.) ---Yes List chronic conditions. ---Fluid retention Is this a behavioral health or substance abuse call? ---No Guidelines Guideline Title Affirmed Question Affirmed Notes Dizziness - Lightheadedness Taking a medicine that could cause dizziness (e.g., blood pressure medications, diuretics) Final Disposition User See Physician within [place], RN, [employer] Referrals REFERRED TO PCP OFFICE Disagree/Comply: Leta Baptist

## 2016-08-03 NOTE — Patient Instructions (Signed)
Likely BPV.  Use bedside exercises for vertigo/dizziness.  Skin a dose of lasix if you are lightheaded.   Update [place] as needed.  Take care.  Glad to see you.

## 2016-08-03 NOTE — Telephone Encounter (Signed)
Pt has appt with Dr Damita Dunnings 08/03/16 at 2:15.

## 2016-08-03 NOTE — Assessment & Plan Note (Signed)
Likely 2 separate issues, both episodic Lightheaded, with relatively low BP, okay to hold lasix for a day or two if needed.  Vertigo, likely BPV, DHP pos, d/w pt about path phys and bedside exercises. Update [place] as needed.  She agrees.

## 2016-08-03 NOTE — Progress Notes (Signed)
She is cutting back on smoking, and I thanked her.    Sx started about 1 week ago.  Started with trouble getting out of bed, fell/off balance getting out of bed, she caught herself on the wall.  Sx resolved at that point.  Then the next day, she has nausea.  No vomiting at that point.  "I didn't feel like myself."  Then was fine for a few days.  In the last ~2 days she had HA and then yesterday with vomiting.  Last night she has sensation of room spinning and nausea.  No fevers.  No diarrhea.  No blood in stool.  She prev felt presyncopal but now has episodes of vertigo/room spinning.  She has been drinking plenty of fluids.   Similar sx about 1 year ago.  No new medicines.      Meds, vitals, and allergies reviewed.   ROS: Per HPI unless specifically indicated in ROS section   GEN: nad, alert and oriented HEENT: mucous membranes moist, TM wnl B, nasal exam wnl NECK: supple w/o LA CV: rrr.  PULM: ctab, no inc wob ABD: soft, +bs EXT: no edema CN 2-12 wnl B, S/S/DTR wnl x4 DHP positive.  Not lightheaded on standing.

## 2016-08-07 ENCOUNTER — Ambulatory Visit (INDEPENDENT_AMBULATORY_CARE_PROVIDER_SITE_OTHER): Payer: Medicare HMO | Admitting: Sports Medicine

## 2016-08-07 DIAGNOSIS — M79671 Pain in right foot: Secondary | ICD-10-CM

## 2016-08-07 DIAGNOSIS — Q828 Other specified congenital malformations of skin: Secondary | ICD-10-CM | POA: Diagnosis not present

## 2016-08-07 DIAGNOSIS — M79672 Pain in left foot: Secondary | ICD-10-CM
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
## 2016-08-07 NOTE — Patient Instructions (Signed)
Pre-Operative Instructions  Congratulations, you have decided to take an important step to improving your quality of life.  You can be assured that the doctors of [facility] will be with you every step of the way.  1. Plan to be at the surgery center/hospital at least 1 (one) hour prior to your scheduled time unless otherwise directed by the surgical center/hospital staff.  You must have a responsible adult accompany you, remain during the surgery and drive you home.  Make sure you have directions to the surgical center/hospital and know how to get there on time. 2. For hospital based surgery you will need to obtain a history and physical form from your family physician within 1 month prior to the date of surgery- we will give you a form for you primary physician.  3. We make every effort to accommodate the date you request for surgery.  There are however, times where surgery dates or times have to be moved.  We will contact you as soon as possible if a change in schedule is required.   4. No Aspirin/Ibuprofen for one week before surgery.  If you are on aspirin, any non-steroidal anti-inflammatory medications (Mobic, Aleve, Ibuprofen) you should stop taking it 7 days prior to your surgery.  You make take Tylenol  For pain prior to surgery.  5. Medications- If you are taking daily heart and blood pressure medications, seizure, reflux, allergy, asthma, anxiety, pain or diabetes medications, make sure the surgery center/hospital is aware before the day of surgery so they may notify you which medications to take or avoid the day of surgery. 6. No food or drink after midnight the night before surgery unless directed otherwise by surgical center/hospital staff. 7. No alcoholic beverages 24 hours prior to surgery.  No smoking 24 hours prior to or 24 hours after surgery. 8. Wear loose pants or shorts- loose enough to fit over bandages, boots, and casts. 9. No slip on shoes, sneakers are best. 10. Bring  your boot with you to the surgery center/hospital.  Also bring crutches or a walker if your physician has prescribed it for you.  If you do not have this equipment, it will be provided for you after surgery. 11. If you have not been contracted by the surgery center/hospital by the day before your surgery, call to confirm the date and time of your surgery. 12. Leave-time from work may vary depending on the type of surgery you have.  Appropriate arrangements should be made prior to surgery with your employer. 13. Prescriptions will be provided immediately following surgery by your doctor.  Have these filled as soon as possible after surgery and take the medication as directed. 14. Remove nail polish on the operative foot. 15. Wash the night before surgery.  The night before surgery wash the foot and leg well with the antibacterial soap provided and water paying special attention to beneath the toenails and in between the toes.  Rinse thoroughly with water and dry well with a towel.  Perform this wash unless told not to do so by your physician.  Enclosed: 1 Ice pack (please put in freezer the night before surgery)   1 Hibiclens skin cleaner   Pre-op Instructions  If you have any questions regarding the instructions, do not hesitate to call our office.  [place]: [address] [place], [place] [address] [phone]  [place]: [address] [place], [place] [address] [phone]  [place]: [address]  [place], [place] [address] [phone]   Dr.   Norman Regal DPM, Dr. Matthew Wagoner DPM, Dr. M. Todd Hyatt DPM, Dr. Wonder Donaway DPM 

## 2016-08-17 ENCOUNTER — Ambulatory Visit
Admission: RE | Admit: 2016-08-17 | Discharge: 2016-08-17 | Disposition: A | Discharge status: Home / Self Care | Payer: Medicare HMO | Source: Ambulatory Visit | Attending: Family Medicine | Admitting: Family Medicine

## 2016-08-17 ENCOUNTER — Encounter: Payer: Self-pay | Admitting: *Deleted

## 2016-08-17 DIAGNOSIS — Z1231 Encounter for screening mammogram for malignant neoplasm of breast: Secondary | ICD-10-CM | POA: Diagnosis not present

## 2016-09-05 DIAGNOSIS — R69 Illness, unspecified: Secondary | ICD-10-CM | POA: Diagnosis not present

## 2016-09-10 ENCOUNTER — Encounter: Payer: Self-pay | Admitting: Sports Medicine

## 2016-09-10 DIAGNOSIS — D492 Neoplasm of unspecified behavior of bone, soft tissue, and skin: Secondary | ICD-10-CM | POA: Diagnosis not present

## 2016-09-10 DIAGNOSIS — E039 Hypothyroidism, unspecified: Secondary | ICD-10-CM | POA: Diagnosis not present

## 2016-09-10 DIAGNOSIS — D2371 Other benign neoplasm of skin of right lower limb, including hip: Secondary | ICD-10-CM | POA: Diagnosis not present

## 2016-09-10 DIAGNOSIS — Q828 Other specified congenital malformations of skin: Secondary | ICD-10-CM | POA: Diagnosis not present

## 2016-09-10 DIAGNOSIS — D485 Neoplasm of uncertain behavior of skin: Secondary | ICD-10-CM | POA: Diagnosis not present

## 2016-09-10 DIAGNOSIS — D239 Other benign neoplasm of skin, unspecified: Secondary | ICD-10-CM | POA: Diagnosis not present

## 2016-09-10 DIAGNOSIS — D2372 Other benign neoplasm of skin of left lower limb, including hip: Secondary | ICD-10-CM | POA: Diagnosis not present

## 2016-09-11 ENCOUNTER — Telehealth: Payer: Self-pay | Admitting: *Deleted

## 2016-09-11 ENCOUNTER — Telehealth: Payer: Self-pay | Admitting: Sports Medicine

## 2016-09-11 NOTE — Telephone Encounter (Signed)
Pt returned your call. She said she is still having tingling in her left foot is this normal and having a little  nausea

## 2016-09-11 NOTE — Telephone Encounter (Addendum)
Pt states missed Dr. Leeanne Rio call this morning and she has some questions. Left message to call again or I would call later.11/02/2016-Pt left message with name, DOB and phone number. I left message informing pt that if she would leave a message with a question I could often call back with an answer, to call again on Monday.

## 2016-09-11 NOTE — Telephone Encounter (Signed)
I called patient back and advised her to loosen ACE wrap and to take her nausea medication as Rx'd and to call back if she has any more issues -Dr. Cannon Kettle

## 2016-09-11 NOTE — Telephone Encounter (Signed)
Post op check phone call made to patient. Patient did not answer. Left voicemail with call back phone # to call office if there are any problems or issues -Dr. Cannon Kettle

## 2016-09-14 NOTE — Progress Notes (Signed)
DOS 06.18.2018 Excise/cut out painful callus lesions bilateral.

## 2016-09-17 ENCOUNTER — Ambulatory Visit (INDEPENDENT_AMBULATORY_CARE_PROVIDER_SITE_OTHER): Payer: Medicare HMO | Admitting: Podiatry

## 2016-09-17 ENCOUNTER — Encounter: Payer: Self-pay | Admitting: Podiatry

## 2016-09-17 DIAGNOSIS — Z9889 Other specified postprocedural states: Secondary | ICD-10-CM

## 2016-09-17 DIAGNOSIS — Q828 Other specified congenital malformations of skin: Secondary | ICD-10-CM

## 2016-09-18 ENCOUNTER — Ambulatory Visit: Payer: Medicare HMO

## 2016-09-19 DIAGNOSIS — R69 Illness, unspecified: Secondary | ICD-10-CM | POA: Diagnosis not present

## 2016-09-20 DIAGNOSIS — R69 Illness, unspecified: Secondary | ICD-10-CM | POA: Diagnosis not present

## 2016-09-20 NOTE — Progress Notes (Signed)
Subjective: [name] is a 61 y.o. is seen today in office s/p bilateral skin lesion excisions preformed by Dr. Cannon Kettle last week. She presents today for POV #1. They state their pain is controlled. She's remain the surgical shoe. Denies any systemic complaints such as fevers, chills, nausea, vomiting. No calf pain, chest pain, shortness of breath.   Objective: General: No acute distress, AAOx3; surgical shoes are very dirty. DP/PT pulses palpable 2/4, CRT < 3 sec to all digits.  Protective sensation intact. Motor function intact.  Bilateral feet: Incision is well coapted without any evidence of dehiscence and sutures are intact. There is no surrounding erythema, ascending cellulitis, fluctuance, crepitus, malodor, drainage/purulence. There is minimal edema around the surgical site. There is no pain along the surgical site.  No other areas of tenderness to bilateral lower extremities.  No other open lesions or pre-ulcerative lesions.  No pain with calf compression, swelling, warmth, erythema.   Assessment and Plan:  Status post bilateral foot surgery, doing well with no complications   -Treatment options discussed including all alternatives, risks, and complications -Antibiotic ointment was applied along the incisions followed by a dry sterile dressing. Keep dressing clean, dry, intact. -Remain in surgical shoes. -Ice/elevation -Pain medication as needed- she has not been needing -Monitor for any clinical signs or symptoms of infection and DVT/PE and directed to call the office immediately should any occur or go to the ER. -Follow-up in 1 week with Dr. Cannon Kettle or sooner if any problems arise. In the meantime, encouraged to call the office with any questions, concerns, change in symptoms.   Celesta Gentile, DPM

## 2016-09-25 ENCOUNTER — Telehealth: Payer: Self-pay | Admitting: Sports Medicine

## 2016-09-25 ENCOUNTER — Ambulatory Visit (INDEPENDENT_AMBULATORY_CARE_PROVIDER_SITE_OTHER): Payer: Self-pay | Admitting: Sports Medicine

## 2016-09-25 DIAGNOSIS — Z9889 Other specified postprocedural states: Secondary | ICD-10-CM

## 2016-09-25 DIAGNOSIS — M79671 Pain in right foot: Secondary | ICD-10-CM

## 2016-09-25 DIAGNOSIS — M79672 Pain in left foot: Secondary | ICD-10-CM

## 2016-09-25 DIAGNOSIS — Q828 Other specified congenital malformations of skin: Secondary | ICD-10-CM

## 2016-09-25 NOTE — Telephone Encounter (Signed)
I called pt back and relayed Dr. Leeanne Rio message. She said she must have not paid attention or she forgot she has been taking them at night. She stated she only had 4 or 5 pills left. I reiterated that Dr. Cannon Kettle stated we could not refill until she is out. I told pt to call us back when she is out and we will go from there.
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
## 2016-09-25 NOTE — Telephone Encounter (Signed)
Yes, I was calling to see if I could get a refill on my pain medication. If you need to talk to me, you can call me at [phone].

## 2016-09-25 NOTE — Telephone Encounter (Signed)
When she was here she said she had enough pain medication, almost the whole bottle. We can only refill it if she's out of pain medication. -Dr. Chauncey Cruel

## 2016-10-01 ENCOUNTER — Ambulatory Visit (INDEPENDENT_AMBULATORY_CARE_PROVIDER_SITE_OTHER): Payer: Self-pay | Admitting: Podiatry

## 2016-10-01 DIAGNOSIS — Z9889 Other specified postprocedural states: Secondary | ICD-10-CM

## 2016-10-01 DIAGNOSIS — Q828 Other specified congenital malformations of skin: Secondary | ICD-10-CM

## 2016-10-01 NOTE — Progress Notes (Signed)
[name] is a 61 y.o. female patient seen today in office for POV #2  (DOS 09-10-16), S/P excision of benign lesions bilateral. She is very anxious to get her sutures out  Noted both incisions on her heels to be gapped open. No active drainage, no redness or any other s/s of infection Noted well healing incisions on plantar forefoot bilateral.   Attempted to remove most stiches, patient was experiencing a lot of pain although I applied lidocaine cream to areas. I was able to cut most of the sutures but unable to pull them out. 4 sutures remained intact. Applied steri-stripes to all surgical areas and applied clean dry gauze. Advised her to stay in her surgical shoes at all times and keep her feet dry. She is to follow up with Dr Cannon Kettle next week or sooner if problems arise

## 2016-10-05 ENCOUNTER — Other Ambulatory Visit: Payer: Self-pay | Admitting: Family Medicine

## 2016-10-05 DIAGNOSIS — R69 Illness, unspecified: Secondary | ICD-10-CM | POA: Diagnosis not present

## 2016-10-05 DIAGNOSIS — F310 Bipolar disorder, current episode hypomanic: Secondary | ICD-10-CM | POA: Diagnosis not present

## 2016-10-09 ENCOUNTER — Ambulatory Visit (INDEPENDENT_AMBULATORY_CARE_PROVIDER_SITE_OTHER): Payer: Self-pay | Admitting: Sports Medicine

## 2016-10-09 DIAGNOSIS — M79672 Pain in left foot: Secondary | ICD-10-CM

## 2016-10-09 DIAGNOSIS — M79671 Pain in right foot: Secondary | ICD-10-CM

## 2016-10-09 DIAGNOSIS — Z9889 Other specified postprocedural states: Secondary | ICD-10-CM

## 2016-10-09 DIAGNOSIS — Q828 Other specified congenital malformations of skin: Secondary | ICD-10-CM

## 2016-10-09 MED ORDER — CLINDAMYCIN HCL 300 MG PO CAPS: 300.0000 mg | ORAL_CAPSULE | Freq: Three times a day (TID) | ORAL | 0 refills | Status: DC
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
## 2016-10-10 DIAGNOSIS — Z13820 Encounter for screening for osteoporosis: Secondary | ICD-10-CM | POA: Diagnosis not present

## 2016-10-10 DIAGNOSIS — B182 Chronic viral hepatitis C: Secondary | ICD-10-CM | POA: Diagnosis not present

## 2016-10-10 DIAGNOSIS — Z1211 Encounter for screening for malignant neoplasm of colon: Secondary | ICD-10-CM | POA: Diagnosis not present

## 2016-10-10 DIAGNOSIS — K7460 Unspecified cirrhosis of liver: Secondary | ICD-10-CM | POA: Diagnosis not present

## 2016-10-10 DIAGNOSIS — I8510 Secondary esophageal varices without bleeding: Secondary | ICD-10-CM | POA: Diagnosis not present

## 2016-10-17 ENCOUNTER — Other Ambulatory Visit: Payer: Self-pay | Admitting: Family Medicine

## 2016-10-17 DIAGNOSIS — Z13820 Encounter for screening for osteoporosis: Secondary | ICD-10-CM

## 2016-10-17 DIAGNOSIS — K7460 Unspecified cirrhosis of liver: Secondary | ICD-10-CM

## 2016-10-17 NOTE — Progress Notes (Signed)
Left message for [name] to call our office and schedule lab visit to get blood work done for [facility].  Future orders entered in EPIC.

## 2016-10-18 ENCOUNTER — Telehealth: Payer: Self-pay | Admitting: Sports Medicine

## 2016-10-18 NOTE — Telephone Encounter (Signed)
Pt presented for me to check her painful foot with cracked surgery sites. Pt's right plantar 5th, 2nd MPJs and heel surgery sites have cracks in the hard callouses, but the skin beneath is without drainage in the MPJ areas, but the heel has a small amount of macerated skin less than the size of a dime. I saw there were no areas of redness or drainage, and there was no drainage on any of the 8 small plastic bandaids covering the areas. I did witness pt squeezing, picking and pushing the surgery sites. I informed Dr. Jacqualyn Posey of pt's surgery site condition and he stated cover the areas with iodoform and gauze and have pt see Dr. Cannon Kettle next Tuesday. In site of pt's picking and pushing on the surgical sites, I told pt to keep the dressing in place, clean and dry until seen at next visit with Dr. Cannon Kettle. Pt states understanding. Pt has an appt 10/23/2016.

## 2016-10-18 NOTE — Telephone Encounter (Signed)
Dr. Cannon Kettle did surgery on me. My left foot is great but my right foot not so much. When the girl went to take the stitches out of the two places on my right foot it now looks like someone took a paring knife to my foot. It is an open area. On a pain level of 1-10 I'm at a 10. I'm also having some numbing/tingling sensation in my foot.

## 2016-10-18 NOTE — Telephone Encounter (Signed)
Thank you :)

## 2016-10-18 NOTE — Telephone Encounter (Signed)
Per Marcy Siren, I called pt and told her that Val wanted her to come in today between now and 12 noon or between 1:00 - 5:00 pm so Marcy Siren could look at her foot to see what is going. Pt stated she would be here after 1:00 pm. Pt also stated the last time she spoke to Dr. Cannon Kettle she asked about the bonding glue but that Dr. Cannon Kettle did not want to do that then.

## 2016-10-23 ENCOUNTER — Ambulatory Visit (INDEPENDENT_AMBULATORY_CARE_PROVIDER_SITE_OTHER): Payer: Medicare HMO | Admitting: Sports Medicine

## 2016-10-23 DIAGNOSIS — M79672 Pain in left foot: Secondary | ICD-10-CM

## 2016-10-23 DIAGNOSIS — Z9889 Other specified postprocedural states: Secondary | ICD-10-CM | POA: Diagnosis not present

## 2016-10-23 DIAGNOSIS — Q828 Other specified congenital malformations of skin: Secondary | ICD-10-CM

## 2016-10-23 DIAGNOSIS — T8130XA Disruption of wound, unspecified, initial encounter: Secondary | ICD-10-CM

## 2016-10-23 DIAGNOSIS — M79671 Pain in right foot: Secondary | ICD-10-CM

## 2016-10-23 MED ORDER — GABAPENTIN 300 MG PO CAPS: 300.0000 mg | ORAL_CAPSULE | Freq: Every day | ORAL | 1 refills | Status: DC
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
## 2016-10-24 ENCOUNTER — Ambulatory Visit: Payer: Medicare HMO | Admitting: Sports Medicine

## 2016-10-24 DIAGNOSIS — F310 Bipolar disorder, current episode hypomanic: Secondary | ICD-10-CM | POA: Diagnosis not present

## 2016-10-24 DIAGNOSIS — R69 Illness, unspecified: Secondary | ICD-10-CM | POA: Diagnosis not present

## 2016-10-30 ENCOUNTER — Encounter: Payer: Self-pay | Admitting: Sports Medicine

## 2016-10-30 ENCOUNTER — Ambulatory Visit (INDEPENDENT_AMBULATORY_CARE_PROVIDER_SITE_OTHER): Payer: Self-pay | Admitting: Sports Medicine

## 2016-10-30 VITALS — BP 126/71 | HR 73 | Resp 16

## 2016-10-30 DIAGNOSIS — Q828 Other specified congenital malformations of skin: Secondary | ICD-10-CM

## 2016-10-30 DIAGNOSIS — T8130XA Disruption of wound, unspecified, initial encounter: Secondary | ICD-10-CM

## 2016-10-30 DIAGNOSIS — Z9889 Other specified postprocedural states: Secondary | ICD-10-CM

## 2016-10-30 DIAGNOSIS — M79672 Pain in left foot: Secondary | ICD-10-CM

## 2016-10-30 DIAGNOSIS — M79671 Pain in right foot: Secondary | ICD-10-CM

## 2016-10-30 MED ORDER — CAPSAICIN-MENTHOL-METHYL SAL 0.025-1-12 % EX CREA: TOPICAL_CREAM | CUTANEOUS | 1 refills | Status: DC
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
## 2016-10-31 ENCOUNTER — Telehealth: Payer: Self-pay | Admitting: *Deleted

## 2016-10-31 DIAGNOSIS — T8130XA Disruption of wound, unspecified, initial encounter: Secondary | ICD-10-CM

## 2016-10-31 DIAGNOSIS — Z9889 Other specified postprocedural states: Secondary | ICD-10-CM

## 2016-10-31 DIAGNOSIS — Q828 Other specified congenital malformations of skin: Secondary | ICD-10-CM

## 2016-10-31 DIAGNOSIS — M79672 Pain in left foot: Secondary | ICD-10-CM

## 2016-10-31 DIAGNOSIS — M79671 Pain in right foot: Secondary | ICD-10-CM

## 2016-10-31 NOTE — Telephone Encounter (Addendum)
-----   Message from Landis Martins, [place] sent at 10/30/2016 10:34 AM EDT ----- Regarding: Wound care center Recommendations on other wound care options for bilateral wounds.10/31/2016-Faxed required form, clinicals and demographics to [place].

## 2016-11-05 ENCOUNTER — Ambulatory Visit
Admission: RE | Admit: 2016-11-05 | Discharge: 2016-11-05 | Disposition: A | Discharge status: Home / Self Care | Payer: Medicare HMO | Source: Ambulatory Visit | Attending: Nurse Practitioner | Admitting: Nurse Practitioner

## 2016-11-05 ENCOUNTER — Encounter: Payer: Medicare HMO | Attending: Nurse Practitioner | Admitting: Nurse Practitioner

## 2016-11-05 ENCOUNTER — Other Ambulatory Visit: Payer: Self-pay | Admitting: Nurse Practitioner

## 2016-11-05 DIAGNOSIS — Z885 Allergy status to narcotic agent status: Secondary | ICD-10-CM | POA: Insufficient documentation

## 2016-11-05 DIAGNOSIS — S91302A Unspecified open wound, left foot, initial encounter: Secondary | ICD-10-CM | POA: Insufficient documentation

## 2016-11-05 DIAGNOSIS — T814XXS Infection following a procedure, sequela: Principal | ICD-10-CM

## 2016-11-05 DIAGNOSIS — Z9689 Presence of other specified functional implants: Secondary | ICD-10-CM | POA: Diagnosis not present

## 2016-11-05 DIAGNOSIS — Z9889 Other specified postprocedural states: Secondary | ICD-10-CM | POA: Insufficient documentation

## 2016-11-05 DIAGNOSIS — M205X2 Other deformities of toe(s) (acquired), left foot: Secondary | ICD-10-CM | POA: Insufficient documentation

## 2016-11-05 DIAGNOSIS — T8189XA Other complications of procedures, not elsewhere classified, initial encounter: Secondary | ICD-10-CM | POA: Diagnosis not present

## 2016-11-05 DIAGNOSIS — L84 Corns and callosities: Secondary | ICD-10-CM | POA: Diagnosis not present

## 2016-11-05 DIAGNOSIS — G629 Polyneuropathy, unspecified: Secondary | ICD-10-CM | POA: Insufficient documentation

## 2016-11-05 DIAGNOSIS — Y839 Surgical procedure, unspecified as the cause of abnormal reaction of the patient, or of later complication, without mention of misadventure at the time of the procedure: Secondary | ICD-10-CM | POA: Insufficient documentation

## 2016-11-05 DIAGNOSIS — S91301A Unspecified open wound, right foot, initial encounter: Secondary | ICD-10-CM | POA: Diagnosis not present

## 2016-11-05 DIAGNOSIS — IMO0001 Reserved for inherently not codable concepts without codable children: Secondary | ICD-10-CM

## 2016-11-05 DIAGNOSIS — J449 Chronic obstructive pulmonary disease, unspecified: Secondary | ICD-10-CM | POA: Diagnosis not present

## 2016-11-05 DIAGNOSIS — Z886 Allergy status to analgesic agent status: Secondary | ICD-10-CM | POA: Insufficient documentation

## 2016-11-05 DIAGNOSIS — T8131XA Disruption of external operation (surgical) wound, not elsewhere classified, initial encounter: Secondary | ICD-10-CM | POA: Insufficient documentation

## 2016-11-05 DIAGNOSIS — L97518 Non-pressure chronic ulcer of other part of right foot with other specified severity: Secondary | ICD-10-CM | POA: Diagnosis not present

## 2016-11-05 DIAGNOSIS — Z880 Allergy status to penicillin: Secondary | ICD-10-CM | POA: Diagnosis not present

## 2016-11-05 DIAGNOSIS — L97512 Non-pressure chronic ulcer of other part of right foot with fat layer exposed: Secondary | ICD-10-CM | POA: Diagnosis not present

## 2016-11-05 DIAGNOSIS — F17210 Nicotine dependence, cigarettes, uncomplicated: Secondary | ICD-10-CM | POA: Diagnosis not present

## 2016-11-05 DIAGNOSIS — R69 Illness, unspecified: Secondary | ICD-10-CM | POA: Diagnosis not present

## 2016-11-06 NOTE — Progress Notes (Signed)
AINO, HECKERT ([id number]) Visit Report for 11/05/2016 Allergy List Details Patient Name: [name], [name] Date of Service: 11/05/2016 8:00 AM Medical Record Number: [id number] Patient Account Number: [id number] Date of Birth/Sex: 1955-10-20 (60 y.o. Female) Treating RN: Montey Hora Primary Care Sarafina Puthoff: Eliezer Lofts Other Clinician: Referring Romano Stigger: Eliezer Lofts Treating Celia Gibbons/Extender: Lawanda Cousins Weeks in Treatment: 0 Allergies Active Allergies penicillin aspirin Demerol Allergy Notes Electronic Signature(s) Signed: 11/05/2016 5:39:18 PM By: Montey Hora Entered By: Montey Hora on 11/05/2016 08:27:22 [name] ([id number]) -------------------------------------------------------------------------------- Arrival Information Details Patient Name: [name] Date of Service: 11/05/2016 8:00 AM Medical Record Number: [id number] Patient Account Number: [id number] Date of Birth/Sex: 1956/01/27 (60 y.o. Female) Treating RN: Montey Hora Primary Care Harrel Ferrone: Eliezer Lofts Other Clinician: Referring Mosi Hannold: Eliezer Lofts Treating Dian Minahan/Extender: Cathie Olden in Treatment: 0 Visit Information Patient Arrived: Ambulatory Arrival Time: 08:04 Accompanied By: sister, [name] Transfer Assistance: None Patient Identification Verified: No Secondary Verification Process No Completed: Patient Requires Transmission- No Based Precautions: Patient Has Alerts: Yes Patient Alerts: NOT DIABETIC Electronic Signature(s) Signed: 11/05/2016 5:39:18 PM By: Montey Hora Entered By: Montey Hora on 11/05/2016 08:05:15 [name] ([id number]) -------------------------------------------------------------------------------- Clinic Level of Care Assessment Details Patient Name: [name] Date of Service: 11/05/2016 8:00 AM Medical Record Number: [id number] Patient Account Number: [id number] Date of Birth/Sex: 09-18-1955 (60 y.o. Female) Treating RN: Montey Hora Primary Care Yancy Knoble: Eliezer Lofts Other Clinician: Referring Burton Gahan: Eliezer Lofts Treating Jimie Kuwahara/Extender: Cathie Olden in Treatment: 0 Clinic Level of Care Assessment Items TOOL 2 Quantity Score []  - Use when only an EandM is performed on the INITIAL visit 0 ASSESSMENTS - Nursing Assessment / Reassessment X - General Physical Exam (combine w/ comprehensive assessment (listed just 1 20 below) when performed on new pt. evals) X - Comprehensive Assessment (HX, ROS, Risk Assessments, Wounds Hx, etc.) 1 25 ASSESSMENTS - Wound and Skin Assessment / Reassessment []  - Simple Wound Assessment / Reassessment - one wound 0 X - Complex Wound Assessment / Reassessment - multiple wounds 4 5 []  - Dermatologic / Skin Assessment (not related to wound area) 0 ASSESSMENTS - Ostomy and/or Continence Assessment and Care []  - Incontinence Assessment and Management 0 []  - Ostomy Care Assessment and Management (repouching, etc.) 0 PROCESS - Coordination of Care X - Simple Patient / Family Education for ongoing care 1 15 []  - Complex (extensive) Patient / Family Education for ongoing care 0 X - Staff obtains Programmer, systems, Records, Test Results / Process Orders 1 10 []  - Staff telephones HHA, Nursing Homes / Clarify orders / etc 0 []  - Routine Transfer to another Facility (non-emergent condition) 0 []  - Routine Hospital Admission (non-emergent condition) 0 X - New Admissions / Biomedical engineer / Ordering NPWT, Apligraf, etc. 1 15 []  - Emergency Hospital Admission (emergent condition) 0 X - Simple Discharge Coordination 1 10 [name], Monica ([id number]) []  - Complex (extensive) Discharge Coordination 0 PROCESS - Special Needs []  - Pediatric / Minor Patient Management 0 []  - Isolation Patient Management 0 []  - Hearing / Language / Visual special needs 0 []  - Assessment of Community assistance (transportation, D/C planning, etc.) 0 []  - Additional assistance / Altered mentation 0 []  -  Support Surface(s) Assessment (bed, cushion, seat, etc.) 0 INTERVENTIONS - Wound Cleansing / Measurement X - Wound Imaging (photographs - any number of wounds) 1 5 []  - Wound Tracing (instead of photographs) 0 []  - Simple Wound Measurement - one wound 0 X - Complex Wound Measurement - multiple wounds 4 5 []  - Simple Wound  Cleansing - one wound 0 X - Complex Wound Cleansing - multiple wounds 4 5 INTERVENTIONS - Wound Dressings X - Small Wound Dressing one or multiple wounds 4 10 []  - Medium Wound Dressing one or multiple wounds 0 []  - Large Wound Dressing one or multiple wounds 0 []  - Application of Medications - injection 0 INTERVENTIONS - Miscellaneous []  - External ear exam 0 []  - Specimen Collection (cultures, biopsies, blood, body fluids, etc.) 0 []  - Specimen(s) / Culture(s) sent or taken to Lab for analysis 0 []  - Patient Transfer (multiple staff / Harrel Lemon Lift / Similar devices) 0 []  - Simple Staple / Suture removal (25 or less) 0 []  - Complex Staple / Suture removal (26 or more) 0 [name], Monica ([id number]) []  - Hypo / Hyperglycemic Management (close monitor of Blood Glucose) 0 X - Ankle / Brachial Index (ABI) - do not check if billed separately 1 15 Has the patient been seen at the hospital within the last three years: Yes Total Score: 215 Level Of Care: New/Established - Level 5 Electronic Signature(s) Signed: 11/05/2016 5:39:18 PM By: Montey Hora Entered By: Montey Hora on 11/05/2016 16:59:01 [name] ([id number]) -------------------------------------------------------------------------------- Encounter Discharge Information Details Patient Name: [name] Date of Service: 11/05/2016 8:00 AM Medical Record Number: [id number] Patient Account Number: [id number] Date of Birth/Sex: 1956-01-04 (60 y.o. Female) Treating RN: Montey Hora Primary Care Gokul Waybright: Eliezer Lofts Other Clinician: Referring Herb Beltre: Eliezer Lofts Treating Dominico Rod/Extender: Cathie Olden in Treatment: 0 Encounter Discharge Information Items Discharge Pain Level: 0 Discharge Condition: Stable Ambulatory Status: Ambulatory Discharge Destination: Home Private Transportation: Auto Accompanied By: sister Schedule Follow-up Appointment: Yes Medication Reconciliation completed and No provided to Patient/Care Jalissa Heinzelman: Clinical Summary of Care: Electronic Signature(s) Signed: 11/05/2016 5:39:18 PM By: Montey Hora Entered By: Montey Hora on 11/05/2016 08:43:27 [name] ([id number]) -------------------------------------------------------------------------------- General Visit Notes Details Patient Name: [name] Date of Service: 11/05/2016 8:00 AM Medical Record Number: [id number] Patient Account Number: [id number] Date of Birth/Sex: [birth year] (60 y.o. Female) Treating RN: Montey Hora Primary Care Codey Burling: Eliezer Lofts Other Clinician: Referring Madalynne Gutmann: Eliezer Lofts Treating Tristain Daily/Extender: Cathie Olden in Treatment: 0 Notes Leah NP attempted to debride calluses and remove sutures from wounds and patient was unable to tolerate this - yelled out in pain and pulled away. Leah NP was able to remove 2 sutures with yelling and patient pulling her foot away. Electronic Signature(s) Signed: 11/05/2016 5:39:18 PM By: Montey Hora Entered By: Montey Hora on 11/05/2016 08:52:30 [name] ([id number]) -------------------------------------------------------------------------------- Lower Extremity Assessment Details Patient Name: [name] Date of Service: 11/05/2016 8:00 AM Medical Record Number: [id number] Patient Account Number: [id number] Date of Birth/Sex: Oct 12, 1955 (60 y.o. Female) Treating RN: Montey Hora Primary Care Melesa Lecy: Eliezer Lofts Other Clinician: Referring Dearra Myhand: Eliezer Lofts Treating Avonte Sensabaugh/Extender: Cathie Olden in Treatment: 0 Edema Assessment Assessed: [Left: No] [Right: No] Edema: [Left:  No] [Right: No] Calf Left: Right: Point of Measurement: 32 cm From Medial Instep 35.4 cm 35.3 cm Ankle Left: Right: Point of Measurement: 10 cm From Medial Instep 21.5 cm 20.5 cm Vascular Assessment Pulses: Dorsalis Pedis Palpable: [Left:Yes] [Right:Yes] Doppler Audible: [Left:Yes] Posterior Tibial Palpable: [Left:Yes] [Right:Yes] Doppler Audible: [Left:Yes] Extremity colors, hair growth, and conditions: Extremity Color: [Left:Normal] [Right:Normal] Hair Growth on Extremity: [Left:No] [Right:No] Temperature of Extremity: [Left:Warm] [Right:Warm] Capillary Refill: [Left:< 3 seconds] [Right:< 3 seconds] Blood Pressure: Brachial: [Left:114] Dorsalis Pedis: 102 [Left:Dorsalis Pedis:] Ankle: Posterior Tibial: 104 [Left:Posterior Tibial: 0.91] Toe Nail Assessment Left: Right: Thick: No No Discolored: No No Deformed:  No No Improper Length and Hygiene: No No AUBREA, MEIXNER ([id number]) Electronic Signature(s) Signed: 11/05/2016 5:39:18 PM By: Montey Hora Entered By: Montey Hora on 11/05/2016 08:26:15 [name] ([id number]) -------------------------------------------------------------------------------- Multi Wound Chart Details Patient Name: [name] Date of Service: 11/05/2016 8:00 AM Medical Record Number: [id number] Patient Account Number: [id number] Date of Birth/Sex: 06-Jan-1956 (60 y.o. Female) Treating RN: Montey Hora Primary Care Ezzard Ditmer: Eliezer Lofts Other Clinician: Referring Katie Moch: Eliezer Lofts Treating Celica Kotowski/Extender: Cathie Olden in Treatment: 0 Vital Signs Height(in): 63 Pulse(bpm): 64 Weight(lbs): 149 Blood Pressure 115/46 (mmHg): Body Mass Index(BMI): 26 Temperature(F): 97.7 Respiratory Rate 16 (breaths/min): Photos: [1:No Photos] [2:No Photos] [3:No Photos] Wound Location: [1:Right Toe Great] [2:Right Metatarsal head fifth Right Calcaneus - Plantar - Plantar] Wounding Event: [1:Surgical Injury] [2:Surgical Injury]  [3:Surgical Injury] Primary Etiology: [1:Open Surgical Wound] [2:Open Surgical Wound] [3:Open Surgical Wound] Comorbid History: [1:Chronic Obstructive Pulmonary Disease (COPD), Neuropathy] [2:Chronic Obstructive Pulmonary Disease (COPD), Neuropathy] [3:Chronic Obstructive Pulmonary Disease (COPD), Neuropathy] Date Acquired: [1:09/10/2016] [2:09/10/2016] [3:09/10/2016] Weeks of Treatment: [1:0] [2:0] [3:0] Wound Status: [1:Open] [2:Open] [3:Open] Measurements L x W x D 0.8x0.2x0.1 [2:1x0.2x0.4] [3:1x0.5x0.1] (cm) Area (cm) : [1:0.126] [2:0.157] [3:0.393] Volume (cm) : [1:0.013] [2:0.063] [3:0.039] Classification: [1:Full Thickness Without Exposed Support Structures] [2:Full Thickness Without Exposed Support Structures] [3:Full Thickness Without Exposed Support Structures] Exudate Amount: [1:Medium] [2:Medium] [3:Medium] Exudate Type: [1:Serous] [2:Serous] [3:Serous] Exudate Color: [1:amber] [2:amber] [3:amber] Wound Margin: [1:Flat and Intact] [2:Flat and Intact] [3:Flat and Intact] Granulation Amount: [1:Medium (34-66%)] [2:Medium (34-66%)] [3:Medium (34-66%)] Granulation Quality: [1:Pink] [2:Pink] [3:Pink] Necrotic Amount: [1:Medium (34-66%)] [2:Medium (34-66%)] [3:Medium (34-66%)] Exposed Structures: [1:Fascia: No Fat Layer (Subcutaneous Tissue) Exposed: No Tendon: No Muscle: No] [2:Fascia: No Fat Layer (Subcutaneous Tissue) Exposed: No Tendon: No Muscle: No] [3:Fascia: No Fat Layer (Subcutaneous Tissue) Exposed: No Tendon: No Muscle: No] Joint: No Joint: No Joint: No Bone: No Bone: No Bone: No Epithelialization: None None None Periwound Skin Texture: Callus: Yes Callus: Yes Callus: Yes Excoriation: No Excoriation: No Excoriation: No Induration: No Induration: No Induration: No Crepitus: No Crepitus: No Crepitus: No Rash: No Rash: No Rash: No Scarring: No Scarring: No Scarring: No Periwound Skin Maceration: No Maceration: No Maceration: No Moisture: Dry/Scaly:  No Dry/Scaly: No Dry/Scaly: No Periwound Skin Color: Atrophie Blanche: No Atrophie Blanche: No Atrophie Blanche: No Cyanosis: No Cyanosis: No Cyanosis: No Ecchymosis: No Ecchymosis: No Ecchymosis: No Erythema: No Erythema: No Erythema: No Hemosiderin Staining: No Hemosiderin Staining: No Hemosiderin Staining: No Mottled: No Mottled: No Mottled: No Pallor: No Pallor: No Pallor: No Rubor: No Rubor: No Rubor: No Tenderness on Yes Yes Yes Palpation: Wound Preparation: Ulcer Cleansing: Ulcer Cleansing: Ulcer Cleansing: Rinsed/Irrigated with Rinsed/Irrigated with Rinsed/Irrigated with Saline Saline Saline Topical Anesthetic Topical Anesthetic Topical Anesthetic Applied: Other: lidocaine Applied: Other: lidocaine Applied: Other: lidocaine 4% 4% 4% Wound Number: 4 N/A N/A Photos: No Photos N/A N/A Wound Location: Left Foot - Plantar N/A N/A Wounding Event: Surgical Injury N/A N/A Primary Etiology: Open Surgical Wound N/A N/A Comorbid History: Chronic Obstructive N/A N/A Pulmonary Disease (COPD), Neuropathy Date Acquired: 09/10/2016 N/A N/A Weeks of Treatment: 0 N/A N/A Wound Status: Open N/A N/A Measurements L x W x D 0.2x2.3x0.1 N/A N/A (cm) Area (cm) : 0.361 N/A N/A Volume (cm) : 0.036 N/A N/A Classification: Full Thickness Without N/A N/A Exposed Support Structures Exudate Amount: Large N/A N/A Exudate Type: Serous N/A N/A Exudate Color: amber N/A N/A Totten, Tamie ([id number]) Wound Margin: Flat and Intact N/A N/A Granulation Amount: Medium (34-66%) N/A N/A Granulation Quality: Pink  N/A N/A Necrotic Amount: Medium (34-66%) N/A N/A Exposed Structures: Fascia: No N/A N/A Fat Layer (Subcutaneous Tissue) Exposed: No Tendon: No Muscle: No Joint: No Bone: No Epithelialization: None N/A N/A Periwound Skin Texture: Callus: Yes N/A N/A Excoriation: No Induration: No Crepitus: No Rash: No Scarring: No Periwound Skin Maceration: No N/A  N/A Moisture: Dry/Scaly: No Periwound Skin Color: Atrophie Blanche: No N/A N/A Cyanosis: No Ecchymosis: No Erythema: No Hemosiderin Staining: No Mottled: No Pallor: No Rubor: No Tenderness on Yes N/A N/A Palpation: Wound Preparation: Ulcer Cleansing: N/A N/A Rinsed/Irrigated with Saline Topical Anesthetic Applied: Other: lidocaine 4% Treatment Notes Electronic Signature(s) Signed: 11/05/2016 5:39:18 PM By: Montey Hora Entered By: Montey Hora on 11/05/2016 08:42:34 [name] ([id number]) -------------------------------------------------------------------------------- Multi-Disciplinary Care Plan Details Patient Name: [name] Date of Service: 11/05/2016 8:00 AM Medical Record Number: [id number] Patient Account Number: [id number] Date of Birth/Sex: [birth year] (60 y.o. Female) Treating RN: Montey Hora Primary Care Rydell Wiegel: Eliezer Lofts Other Clinician: Referring Kadon Andrus: Eliezer Lofts Treating Wyman Meschke/Extender: Cathie Olden in Treatment: 0 Active Inactive ` Nutrition Nursing Diagnoses: Potential for alteratiion in Nutrition/Potential for imbalanced nutrition Goals: Patient/caregiver agrees to and verbalizes understanding of need to use nutritional supplements and/or vitamins as prescribed Date Initiated: 11/05/2016 Target Resolution Date: 12/28/2016 Goal Status: Active Interventions: Assess patient nutrition upon admission and as needed per policy Notes: ` Orientation to the Wound Care Program Nursing Diagnoses: Knowledge deficit related to the wound healing center program Goals: Patient/caregiver will verbalize understanding of the [facility] Program Date Initiated: 11/05/2016 Target Resolution Date: 12/28/2016 Goal Status: Active Interventions: Provide education on orientation to the wound center Notes: ` Wound/Skin Impairment Nursing Diagnoses: Impaired tissue integrity [place], Phyllistine ([id number]) Goals: Ulcer/skin breakdown  will have a volume reduction of 30% by week 4 Date Initiated: 11/05/2016 Target Resolution Date: 12/28/2016 Goal Status: Active Ulcer/skin breakdown will have a volume reduction of 50% by week 8 Date Initiated: 11/05/2016 Target Resolution Date: 12/28/2016 Goal Status: Active Ulcer/skin breakdown will have a volume reduction of 80% by week 12 Date Initiated: 11/05/2016 Target Resolution Date: 12/28/2016 Goal Status: Active Ulcer/skin breakdown will heal within 14 weeks Date Initiated: 11/05/2016 Target Resolution Date: 12/28/2016 Goal Status: Active Interventions: Assess patient/caregiver ability to obtain necessary supplies Assess patient/caregiver ability to perform ulcer/skin care regimen upon admission and as needed Assess ulceration(s) every visit Notes: Electronic Signature(s) Signed: 11/05/2016 5:39:18 PM By: Montey Hora Entered By: Montey Hora on 11/05/2016 08:42:21 [name] ([id number]) -------------------------------------------------------------------------------- Pain Assessment Details Patient Name: [name] Date of Service: 11/05/2016 8:00 AM Medical Record Number: [id number] Patient Account Number: [id number] Date of Birth/Sex: 11-08-1955 (60 y.o. Female) Treating RN: Montey Hora Primary Care Kirill Chatterjee: Eliezer Lofts Other Clinician: Referring Margrete Delude: Eliezer Lofts Treating Danee Soller/Extender: Cathie Olden in Treatment: 0 Active Problems Location of Pain Severity and Description of Pain Patient Has Paino No Site Locations With Dressing Change: No Duration of the Pain. Constant / Intermittento Constant Rate the pain. Current Pain Level: 7 Character of Pain Describe the Pain: Splitting, Tender, Throbbing Pain Management and Medication Current Pain Management: Goals for Pain Management Topical or injectable lidocaine is offered to patient for acute pain when surgical debridement is performed. If needed, Patient is instructed to use over the  counter pain medication for the following 24-48 hours after debridement. Wound care MDs do not prescribed pain medications. Patient has chronic pain or uncontrolled pain. Patient has been instructed to make an appointment with their Primary Care Physician for pain management. Electronic Signature(s) Signed: 11/05/2016 5:39:18 PM  By: Montey Hora Entered By: Montey Hora on 11/05/2016 08:06:05 [name] ([id number]) -------------------------------------------------------------------------------- Patient/Caregiver Education Details Patient Name: [name] Date of Service: 11/05/2016 8:00 AM Medical Record Number: [id number] Patient Account Number: [id number] Date of Birth/Gender: 07-Aug-1955 (60 y.o. Female) Treating RN: Montey Hora Primary Care Physician: Eliezer Lofts Other Clinician: Referring Physician: Eliezer Lofts Treating Physician/Extender: Cathie Olden in Treatment: 0 Education Assessment Education Provided To: Patient and Caregiver Education Topics Provided Wound/Skin Impairment: Handouts: Other: wound care as ordered Methods: Demonstration, Explain/Verbal Responses: State content correctly Electronic Signature(s) Signed: 11/05/2016 5:39:18 PM By: Montey Hora Entered By: Montey Hora on 11/05/2016 08:44:14 [name] ([id number]) -------------------------------------------------------------------------------- Wound Assessment Details Patient Name: [name] Date of Service: 11/05/2016 8:00 AM Medical Record Number: [id number] Patient Account Number: [id number] Date of Birth/Sex: 12-Feb-1956 (60 y.o. Female) Treating RN: Montey Hora Primary Care Ezequiel Macauley: Eliezer Lofts Other Clinician: Referring Joash Tony: Eliezer Lofts Treating Haneen Bernales/Extender: Lawanda Cousins Weeks in Treatment: 0 Wound Status Wound Number: 1 Primary Open Surgical Wound Etiology: Wound Location: Right Toe Great Wound Open Wounding Event: Surgical Injury Status: Date  Acquired: 09/10/2016 Comorbid Chronic Obstructive Pulmonary Weeks Of Treatment: 0 History: Disease (COPD), Neuropathy Clustered Wound: No Photos Photo Uploaded By: Montey Hora on 11/05/2016 16:47:14 Wound Measurements Length: (cm) 0.8 Width: (cm) 0.2 Depth: (cm) 0.1 Area: (cm) 0.126 Volume: (cm) 0.013 % Reduction in Area: % Reduction in Volume: Epithelialization: None Tunneling: No Undermining: No Wound Description Full Thickness Without Exposed Classification: Support Structures Wound Margin: Flat and Intact Exudate Medium Amount: Exudate Type: Serous Exudate Color: amber Foul Odor After Cleansing: No Slough/Fibrino No Wound Bed Granulation Amount: Medium (34-66%) Exposed Structure Granulation Quality: Pink Fascia Exposed: No Necrotic Amount: Medium (34-66%) Fat Layer (Subcutaneous Tissue) Exposed: No Radi, Angele ([id number]) Necrotic Quality: Adherent Slough Tendon Exposed: No Muscle Exposed: No Joint Exposed: No Bone Exposed: No Periwound Skin Texture Texture Color No Abnormalities Noted: No No Abnormalities Noted: No Callus: Yes Atrophie Blanche: No Crepitus: No Cyanosis: No Excoriation: No Ecchymosis: No Induration: No Erythema: No Rash: No Hemosiderin Staining: No Scarring: No Mottled: No Pallor: No Moisture Rubor: No No Abnormalities Noted: No Dry / Scaly: No Temperature / Pain Maceration: No Tenderness on Palpation: Yes Wound Preparation Ulcer Cleansing: Rinsed/Irrigated with Saline Topical Anesthetic Applied: Other: lidocaine 4%, Treatment Notes Wound #1 (Right Toe Great) 1. Cleansed with: Clean wound with Normal Saline 2. Anesthetic Topical Lidocaine 4% cream to wound bed prior to debridement 4. Dressing Applied: Hydrogel Other dressing (specify in notes) 5. Secondary Dressing Applied Kerlix/Conform Notes coverlet Electronic Signature(s) Signed: 11/05/2016 5:39:18 PM By: Montey Hora Entered By: Montey Hora on  11/05/2016 08:29:14 [name] ([id number]) -------------------------------------------------------------------------------- Wound Assessment Details Patient Name: [name] Date of Service: 11/05/2016 8:00 AM Medical Record Number: [id number] Patient Account Number: [id number] Date of Birth/Sex: [birth year] (60 y.o. Female) Treating RN: Montey Hora Primary Care Abdulkadir Emmanuel: Eliezer Lofts Other Clinician: Referring Breelynn Bankert: Eliezer Lofts Treating Tuleen Mandelbaum/Extender: Lawanda Cousins Weeks in Treatment: 0 Wound Status Wound Number: 2 Primary Open Surgical Wound Etiology: Wound Location: Right Metatarsal head fifth - Plantar Wound Open Status: Wounding Event: Surgical Injury Comorbid Chronic Obstructive Pulmonary Date Acquired: 09/10/2016 History: Disease (COPD), Neuropathy Weeks Of Treatment: 0 Clustered Wound: No Photos Photo Uploaded By: Montey Hora on 11/05/2016 16:48:09 Wound Measurements Length: (cm) 1 Width: (cm) 0.2 Depth: (cm) 0.4 Area: (cm) 0.157 Volume: (cm) 0.063 % Reduction in Area: % Reduction in Volume: Epithelialization: None Tunneling: No Undermining: No Wound Description Full Thickness Without Exposed Classification: Support Structures Wound Margin: Flat  and Intact Exudate Medium Amount: Exudate Type: Serous Exudate Color: amber Foul Odor After Cleansing: No Slough/Fibrino Yes Wound Bed Granulation Amount: Medium (34-66%) Exposed Structure Granulation Quality: Pink Fascia Exposed: No Hausman, Sarye ([id number]) Necrotic Amount: Medium (34-66%) Fat Layer (Subcutaneous Tissue) Exposed: No Necrotic Quality: Adherent Slough Tendon Exposed: No Muscle Exposed: No Joint Exposed: No Bone Exposed: No Periwound Skin Texture Texture Color No Abnormalities Noted: No No Abnormalities Noted: No Callus: Yes Atrophie Blanche: No Crepitus: No Cyanosis: No Excoriation: No Ecchymosis: No Induration: No Erythema: No Rash: No Hemosiderin Staining:  No Scarring: No Mottled: No Pallor: No Moisture Rubor: No No Abnormalities Noted: No Dry / Scaly: No Temperature / Pain Maceration: No Tenderness on Palpation: Yes Wound Preparation Ulcer Cleansing: Rinsed/Irrigated with Saline Topical Anesthetic Applied: Other: lidocaine 4%, Treatment Notes Wound #2 (Right, Plantar Metatarsal head fifth) 1. Cleansed with: Clean wound with Normal Saline 2. Anesthetic Topical Lidocaine 4% cream to wound bed prior to debridement 4. Dressing Applied: Hydrogel Other dressing (specify in notes) 5. Secondary Dressing Applied Kerlix/Conform Notes coverlet Electronic Signature(s) Signed: 11/05/2016 5:39:18 PM By: Montey Hora Entered By: Montey Hora on 11/05/2016 08:30:22 [name] ([id number]) -------------------------------------------------------------------------------- Wound Assessment Details Patient Name: [name] Date of Service: 11/05/2016 8:00 AM Medical Record Number: [id number] Patient Account Number: [id number] Date of Birth/Sex: February 28, 1956 (60 y.o. Female) Treating RN: Montey Hora Primary Care Iain [place]: Eliezer Lofts Other Clinician: Referring Aydrien Froman: Eliezer Lofts Treating Halle Davlin/Extender: Lawanda Cousins Weeks in Treatment: 0 Wound Status Wound Number: 3 Primary Open Surgical Wound Etiology: Wound Location: Right Calcaneus - Plantar Wound Open Wounding Event: Surgical Injury Status: Date Acquired: 09/10/2016 Comorbid Chronic Obstructive Pulmonary Weeks Of Treatment: 0 History: Disease (COPD), Neuropathy Clustered Wound: No Photos Photo Uploaded By: Montey Hora on 11/05/2016 16:48:10 Wound Measurements Length: (cm) 1 Width: (cm) 0.5 Depth: (cm) 0.1 Area: (cm) 0.393 Volume: (cm) 0.039 % Reduction in Area: % Reduction in Volume: Epithelialization: None Tunneling: No Undermining: No Wound Description Full Thickness Without Exposed Classification: Support Structures Wound Margin: Flat and  Intact Exudate Medium Amount: Exudate Type: Serous Exudate Color: amber Foul Odor After Cleansing: No Slough/Fibrino Yes Wound Bed Granulation Amount: Medium (34-66%) Exposed Structure Granulation Quality: Pink Fascia Exposed: No Necrotic Amount: Medium (34-66%) Fat Layer (Subcutaneous Tissue) Exposed: No Moya, Amica ([id number]) Necrotic Quality: Adherent Slough Tendon Exposed: No Muscle Exposed: No Joint Exposed: No Bone Exposed: No Periwound Skin Texture Texture Color No Abnormalities Noted: No No Abnormalities Noted: No Callus: Yes Atrophie Blanche: No Crepitus: No Cyanosis: No Excoriation: No Ecchymosis: No Induration: No Erythema: No Rash: No Hemosiderin Staining: No Scarring: No Mottled: No Pallor: No Moisture Rubor: No No Abnormalities Noted: No Dry / Scaly: No Temperature / Pain Maceration: No Tenderness on Palpation: Yes Wound Preparation Ulcer Cleansing: Rinsed/Irrigated with Saline Topical Anesthetic Applied: Other: lidocaine 4%, Treatment Notes Wound #3 (Right, Plantar Calcaneus) 1. Cleansed with: Clean wound with Normal Saline 2. Anesthetic Topical Lidocaine 4% cream to wound bed prior to debridement 4. Dressing Applied: Hydrogel Other dressing (specify in notes) 5. Secondary Dressing Applied Kerlix/Conform Notes coverlet Electronic Signature(s) Signed: 11/05/2016 5:39:18 PM By: Montey Hora Entered By: Montey Hora on 11/05/2016 08:31:28 [name] ([id number]) -------------------------------------------------------------------------------- Wound Assessment Details Patient Name: [name] Date of Service: 11/05/2016 8:00 AM Medical Record Number: [id number] Patient Account Number: [id number] Date of Birth/Sex: May 01, 1955 (60 y.o. Female) Treating RN: Montey Hora Primary Care Kayah Hecker: Eliezer Lofts Other Clinician: Referring Areta Terwilliger: Eliezer Lofts Treating Mauri Temkin/Extender: Lawanda Cousins Weeks in Treatment: 0 Wound  Status Wound Number:  4 Primary Open Surgical Wound Etiology: Wound Location: Left Foot - Plantar Wound Open Wounding Event: Surgical Injury Status: Date Acquired: 09/10/2016 Comorbid Chronic Obstructive Pulmonary Weeks Of Treatment: 0 History: Disease (COPD), Neuropathy Clustered Wound: No Photos Photo Uploaded By: Montey Hora on 11/05/2016 16:51:12 Wound Measurements Length: (cm) 0.2 Width: (cm) 2.3 Depth: (cm) 0.1 Area: (cm) 0.361 Volume: (cm) 0.036 % Reduction in Area: % Reduction in Volume: Epithelialization: None Tunneling: No Undermining: No Wound Description Full Thickness Without Exposed Classification: Support Structures Wound Margin: Flat and Intact Exudate Large Amount: Exudate Type: Serous Exudate Color: amber Foul Odor After Cleansing: No Slough/Fibrino Yes Wound Bed Granulation Amount: Medium (34-66%) Exposed Structure Granulation Quality: Pink Fascia Exposed: No Necrotic Amount: Medium (34-66%) Fat Layer (Subcutaneous Tissue) Exposed: No Roland, Danielys ([id number]) Necrotic Quality: Adherent Slough Tendon Exposed: No Muscle Exposed: No Joint Exposed: No Bone Exposed: No Periwound Skin Texture Texture Color No Abnormalities Noted: No No Abnormalities Noted: No Callus: Yes Atrophie Blanche: No Crepitus: No Cyanosis: No Excoriation: No Ecchymosis: No Induration: No Erythema: No Rash: No Hemosiderin Staining: No Scarring: No Mottled: No Pallor: No Moisture Rubor: No No Abnormalities Noted: No Dry / Scaly: No Temperature / Pain Maceration: No Tenderness on Palpation: Yes Wound Preparation Ulcer Cleansing: Rinsed/Irrigated with Saline Topical Anesthetic Applied: Other: lidocaine 4%, Treatment Notes Wound #4 (Left, Plantar Foot) 1. Cleansed with: Clean wound with Normal Saline 2. Anesthetic Topical Lidocaine 4% cream to wound bed prior to debridement 4. Dressing Applied: Hydrogel Other dressing (specify in notes) 5.  Secondary Dressing Applied Kerlix/Conform Notes coverlet Electronic Signature(s) Signed: 11/05/2016 5:39:18 PM By: Montey Hora Entered By: Montey Hora on 11/05/2016 08:32:46 [name] ([id number]) -------------------------------------------------------------------------------- Vitals Details Patient Name: [name] Date of Service: 11/05/2016 8:00 AM Medical Record Number: [id number] Patient Account Number: [id number] Date of Birth/Sex: [birth year] (60 y.o. Female) Treating RN: Montey Hora Primary Care Lexani Corona: Eliezer Lofts Other Clinician: Referring Keatyn Jawad: Eliezer Lofts Treating Svetlana Bagby/Extender: Cathie Olden in Treatment: 0 Vital Signs Time Taken: 08:06 Temperature (F): 97.7 Height (in): 63 Pulse (bpm): 64 Source: Measured Respiratory Rate (breaths/min): 16 Weight (lbs): 149 Blood Pressure (mmHg): 115/46 Source: Measured Reference Range: 80 - 120 mg / dl Body Mass Index (BMI): 26.4 Electronic Signature(s) Signed: 11/05/2016 5:39:18 PM By: Montey Hora Entered By: Montey Hora on 11/05/2016 08:06:40

## 2016-11-06 NOTE — Progress Notes (Addendum)
BATINA, DOUGAN ([id number]) Visit Report for 11/05/2016 Chief Complaint Document Details Patient Name: [name], [name] Date of Service: 11/05/2016 8:00 AM Medical Record Number: [id number] Patient Account Number: [id number] Date of Birth/Sex: 01-Jun-1955 (61 y.o. Female) Treating RN: Montey Hora Primary Care Provider: Eliezer Lofts Other Clinician: Referring Provider: Landis Martins Treating Provider/Extender: Cathie Olden in Treatment: 0 Information Obtained from: Patient Chief Complaint She is here for initial evaluation of Right foot ulcers Electronic Signature(s) Signed: 11/05/2016 9:27:40 AM By: Lawanda Cousins Entered By: Lawanda Cousins on 11/05/2016 09:27:39 [name] ([id number]) -------------------------------------------------------------------------------- HPI Details Patient Name: [name] Date of Service: 11/05/2016 8:00 AM Medical Record Number: [id number] Patient Account Number: [id number] Date of Birth/Sex: December 16, 1955 (61 y.o. Female) Treating RN: Montey Hora Primary Care Provider: Eliezer Lofts Other Clinician: Referring Provider: Landis Martins Treating Provider/Extender: Cathie Olden in Treatment: 0 History of Present Illness Location: multiple site right foot Quality: sharp Severity: 10 Timing: chronic Context: surgically Modifying Factors: pressure, pain, infection are exacerbating conditions HPI Description: 11/05/16- Ms. Gasparini is here for initial evaluation s/p excision of hyperkeratotic lesions per Dr. Cannon Kettle, DPM on 09/10/16. She originally went to podiatry in April with c/o painful calluses, multiple in office attempts were made for debridement; she ultimately went to the OR for excision and primary closure of these lesions on 09/10/16. She has had multiple post-op visits with removal of most sutures along with dehisence. She was given clindamycin x1 (10/09/16). She reports no radiolgraphic studies have been ordered/performed. She states that  her pain has become progressive with nerve damage to right toes 4-5 since surgical intervention. She does not tolerate light touch and is unable to tolerate any attempt at callus shaving, suture removal or wound interrogation. She withdraws with multiple attempts, although partial removal of suture to the right medial hallux. I have no operative or pathology reports to review and have been unable to reach the office at the time of dictation Electronic Signature(s) Signed: 11/05/2016 9:49:27 AM By: Lawanda Cousins Entered By: Lawanda Cousins on 11/05/2016 09:49:27 [name] ([id number]) -------------------------------------------------------------------------------- Physical Exam Details Patient Name: [name] Date of Service: 11/05/2016 8:00 AM Medical Record Number: [id number] Patient Account Number: [id number] Date of Birth/Sex: 08/17/1955 (61 y.o. Female) Treating RN: Montey Hora Primary Care Provider: Eliezer Lofts Other Clinician: Referring Provider: Landis Martins Treating Provider/Extender: Cathie Olden in Treatment: 0 Constitutional BP within normal limits. afebrile. Respiratory non-labored respiratory effort. clear to all fields. Cardiovascular S1 S2 with regular rate and rhythm. RLE- palpable DP and PT; palpable popliteal. RLE-warm extremity; no edema present; cap refill less than or equal to 3 seconds. Musculoskeletal no assistive devices; wearing non-modified shoes. Neurological hypersensitive and painful to touch. Electronic Signature(s) Signed: 11/05/2016 5:23:40 PM By: Lawanda Cousins Entered By: Lawanda Cousins on 11/05/2016 11:03:52 [name] ([id number]) -------------------------------------------------------------------------------- Physician Orders Details Patient Name: [name] Date of Service: 11/05/2016 8:00 AM Medical Record Number: [id number] Patient Account Number: [id number] Date of Birth/Sex: June 05, 1955 (61 y.o. Female) Treating RN: Montey Hora Primary Care Provider: Eliezer Lofts Other Clinician: Referring Provider: Landis Martins Treating Provider/Extender: Cathie Olden in Treatment: 0 Verbal / Phone Orders: No Diagnosis Coding Wound Cleansing Wound #1 Right Toe Great o Cleanse wound with mild soap and water o May Shower, gently pat wound dry prior to applying new dressing. Wound #2 Right,Plantar Metatarsal head fifth o Cleanse wound with mild soap and water o May Shower, gently pat wound dry prior to applying new dressing. Wound #3 Right,Plantar Calcaneus o Cleanse wound with mild soap  and water o May Shower, gently pat wound dry prior to applying new dressing. Wound #4 Left,Plantar Foot o Cleanse wound with mild soap and water o May Shower, gently pat wound dry prior to applying new dressing. Anesthetic Wound #1 Right Toe Great o Topical Lidocaine 4% cream applied to wound bed prior to debridement Wound #2 Right,Plantar Metatarsal head fifth o Topical Lidocaine 4% cream applied to wound bed prior to debridement Wound #3 Right,Plantar Calcaneus o Topical Lidocaine 4% cream applied to wound bed prior to debridement Wound #4 Left,Plantar Foot o Topical Lidocaine 4% cream applied to wound bed prior to debridement Primary Wound Dressing Wound #1 Right Toe Great o Hydrogel Wound #2 Right,Plantar Metatarsal head fifth o Hydrogel Mcinturff, Clarence ([id number]) Wound #3 Right,Plantar Calcaneus o Hydrogel Wound #4 Left,Plantar Foot o Hydrogel Secondary Dressing Wound #1 Right Toe Great o Other - coverlet Wound #2 Right,Plantar Metatarsal head fifth o Other - coverlet Wound #3 Right,Plantar Calcaneus o Other - coverlet Wound #4 Left,Plantar Foot o Other - coverlet Dressing Change Frequency Wound #1 Right Toe Great o Change dressing every day. Wound #2 Right,Plantar Metatarsal head fifth o Change dressing every day. Wound #3 Right,Plantar Calcaneus o Change  dressing every day. Wound #4 Left,Plantar Foot o Change dressing every day. Follow-up Appointments Wound #1 Right Toe Great o Return Appointment in 1 week. Wound #2 Right,Plantar Metatarsal head fifth o Return Appointment in 1 week. Wound #3 Right,Plantar Calcaneus o Return Appointment in 1 week. Wound #4 Left,Plantar Foot o Return Appointment in 1 week. Off-Loading Wound #1 Right Toe Cyenna Rebello, Virtie ([id number]) o Other: - Please stay off your feet as much as possible Wound #2 Right,Plantar Metatarsal head fifth o Other: - Please stay off your feet as much as possible Wound #3 Right,Plantar Calcaneus o Other: - Please stay off your feet as much as possible Wound #4 Left,Plantar Foot o Other: - Please stay off your feet as much as possible Additional Orders / Instructions Wound #1 Right Toe Great o Increase protein intake. o Other: - Please add vitamin A, vitamin C and zinc supplements to your diet Wound #2 Right,Plantar Metatarsal head fifth o Increase protein intake. o Other: - Please add vitamin A, vitamin C and zinc supplements to your diet Wound #3 Right,Plantar Calcaneus o Increase protein intake. o Other: - Please add vitamin A, vitamin C and zinc supplements to your diet Wound #4 Left,Plantar Foot o Increase protein intake. o Other: - Please add vitamin A, vitamin C and zinc supplements to your diet Radiology o X-ray, foot - bilateral Electronic Signature(s) Signed: 11/05/2016 5:23:40 PM By: Lawanda Cousins Signed: 11/05/2016 5:39:18 PM By: Montey Hora Entered By: Montey Hora on 11/05/2016 08:54:30 [name] ([id number]) -------------------------------------------------------------------------------- Problem List Details Patient Name: [name] Date of Service: 11/05/2016 8:00 AM Medical Record Number: [id number] Patient Account Number: [id number] Date of Birth/Sex: [birth year] (60 y.o. Female) Treating RN: Montey Hora Primary Care Provider: Eliezer Lofts Other Clinician: Referring Provider: Landis Martins Treating Provider/Extender: Cathie Olden in Treatment: 0 Active Problems ICD-10 Encounter Code Description Active Date Diagnosis L84 Corns and callosities 11/05/2016 Yes L97.518 Non-pressure chronic ulcer of other part of right foot with 11/05/2016 Yes other specified severity T81.31XA Disruption of external operation (surgical) wound, not 11/05/2016 Yes elsewhere classified, initial encounter Inactive Problems Resolved Problems Electronic Signature(s) Signed: 11/05/2016 1:30:08 PM By: Lawanda Cousins Previous Signature: 11/05/2016 9:23:29 AM Version By: Lawanda Cousins Entered By: Lawanda Cousins on 11/05/2016 13:30:08 [name] ([id number]) -------------------------------------------------------------------------------- Progress Note Details  Patient Name: STEFANNIE, DEFEO Date of Service: 11/05/2016 8:00 AM Medical Record Number: [id number] Patient Account Number: [id number] Date of Birth/Sex: 1955-12-04 (60 y.o. Female) Treating RN: Montey Hora Primary Care Provider: Eliezer Lofts Other Clinician: Referring Provider: Landis Martins Treating Provider/Extender: Cathie Olden in Treatment: 0 Subjective Chief Complaint Information obtained from Patient She is here for initial evaluation of Right foot ulcers History of Present Illness (HPI) The following HPI elements were documented for the patient's wound: Location: multiple site right foot Quality: sharp Severity: 10 Timing: chronic Context: surgically Modifying Factors: pressure, pain, infection are exacerbating conditions 11/05/16- Ms. Bodner is here for initial evaluation s/p excision of hyperkeratotic lesions per Dr. Cannon Kettle, DPM on 09/10/16. She originally went to podiatry in April with c/o painful calluses, multiple in office attempts were made for debridement; she ultimately went to the OR for excision and primary closure  of these lesions on 09/10/16. She has had multiple post-op visits with removal of most sutures along with dehisence. She was given clindamycin x1 (10/09/16). She reports no radiolgraphic studies have been ordered/performed. She states that her pain has become progressive with nerve damage to right toes 4-5 since surgical intervention. She does not tolerate light touch and is unable to tolerate any attempt at callus shaving, suture removal or wound interrogation. She withdraws with multiple attempts, although partial removal of suture to the right medial hallux. I have no operative or pathology reports to review and have been unable to reach the office at the time of dictation Wound History Patient presents with 2 open wounds that have been present for approximately 9 weeks. Patient has been treating wounds in the following manner: Prisma. Laboratory tests have not been performed in the last month. Patient reportedly has not tested positive for an antibiotic resistant organism. Patient History Information obtained from Patient, Chart. Allergies penicillin, aspirin, Demerol RIONA, LAHTI ([id number]) Family History Cancer - Mother, Siblings, Father, Diabetes - Father, Heart Disease - Mother, Father, Hypertension - Mother, Father, Kidney Disease - Mother, No family history of Lung Disease, Seizures, Stroke, Thyroid Problems, Tuberculosis. Social History Current every day smoker - 1/2 pack daily, Marital Status - Divorced, Alcohol Use - Never, Drug Use - No History, Caffeine Use - Daily. Medical History Eyes Denies history of Cataracts, Glaucoma, Optic Neuritis Ear/Nose/Mouth/Throat Denies history of Chronic sinus problems/congestion, Middle ear problems Hematologic/Lymphatic Denies history of Anemia, Hemophilia, Human Immunodeficiency Virus, Lymphedema, Sickle Cell Disease Respiratory Patient has history of Chronic Obstructive Pulmonary Disease (COPD) Denies history of Aspiration, Asthma,  Pneumothorax, Sleep Apnea, Tuberculosis Cardiovascular Denies history of Angina, Arrhythmia, Congestive Heart Failure, Coronary Artery Disease, Deep Vein Thrombosis, Hypertension, Hypotension, Myocardial Infarction, Peripheral Arterial Disease, Peripheral Venous Disease, Phlebitis, Vasculitis Gastrointestinal Denies history of Cirrhosis , Colitis, Crohn s, Hepatitis A, Hepatitis B, Hepatitis C Endocrine Denies history of Type I Diabetes, Type II Diabetes Genitourinary Denies history of End Stage Renal Disease Immunological Denies history of Lupus Erythematosus, Raynaud s, Scleroderma Integumentary (Skin) Denies history of History of Burn, History of pressure wounds Musculoskeletal Denies history of Gout, Rheumatoid Arthritis, Osteoarthritis, Osteomyelitis Neurologic Patient has history of Neuropathy - Feet only Denies history of Dementia, Quadriplegia, Paraplegia, Seizure Disorder Oncologic Denies history of Received Chemotherapy, Received Radiation Psychiatric Denies history of Anorexia/bulimia, Confinement Anxiety Medical And Surgical History Notes Constitutional Symptoms (General Health) Bipolar; Neuropathy, COPA, Water pill; Thyroid Review of Systems (ROS) Constitutional Symptoms (General Health) Denies complaints or symptoms of Fatigue, Fever, Chills, Marked Weight Change. Eyes Keech, Isadore ([id number]) Complains or has symptoms of  Glasses / Contacts - glasses. Ear/Nose/Mouth/Throat The patient has no complaints or symptoms. Hematologic/Lymphatic The patient has no complaints or symptoms. Respiratory The patient has no complaints or symptoms. Cardiovascular Complains or has symptoms of LE edema - sometimes. Gastrointestinal The patient has no complaints or symptoms. Endocrine Complains or has symptoms of Thyroid disease. Denies complaints or symptoms of Hepatitis, Polydypsia (Excessive Thirst). Genitourinary The patient has no complaints or  symptoms. Immunological The patient has no complaints or symptoms. Integumentary (Skin) Complains or has symptoms of Wounds. Denies complaints or symptoms of Bleeding or bruising tendency, Breakdown, Swelling. Musculoskeletal The patient has no complaints or symptoms. Neurologic The patient has no complaints or symptoms. Oncologic The patient has no complaints or symptoms. Psychiatric Complains or has symptoms of Anxiety. Denies complaints or symptoms of Claustrophobia. Objective Constitutional BP within normal limits. afebrile. Vitals Time Taken: 8:06 AM, Height: 63 in, Source: Measured, Weight: 149 lbs, Source: Measured, BMI: 26.4, Temperature: 97.7 F, Pulse: 64 bpm, Respiratory Rate: 16 breaths/min, Blood Pressure: 115/46 mmHg. Respiratory non-labored respiratory effort. clear to all fields. MAESYN, FRISINGER ([id number]) Cardiovascular S1 S2 with regular rate and rhythm. RLE- palpable DP and PT; palpable popliteal. RLE-warm extremity; no edema present; cap refill less than or equal to 3 seconds. Musculoskeletal no assistive devices; wearing non-modified shoes. Neurological hypersensitive and painful to touch. Integumentary (Hair, Skin) Wound #1 status is Open. Original cause of wound was Surgical Injury. The wound is located on the Right Toe Great. The wound measures 0.8cm length x 0.2cm width x 0.1cm depth; 0.126cm^2 area and 0.013cm^3 volume. There is no tunneling or undermining noted. There is a medium amount of serous drainage noted. The wound margin is flat and intact. There is medium (34-66%) pink granulation within the wound bed. There is a medium (34-66%) amount of necrotic tissue within the wound bed including Adherent Slough. The periwound skin appearance exhibited: Callus. The periwound skin appearance did not exhibit: Crepitus, Excoriation, Induration, Rash, Scarring, Dry/Scaly, Maceration, Atrophie Blanche, Cyanosis, Ecchymosis, Hemosiderin Staining, Mottled,  Pallor, Rubor, Erythema. The periwound has tenderness on palpation. Wound #2 status is Open. Original cause of wound was Surgical Injury. The wound is located on the Right,Plantar Metatarsal head fifth. The wound measures 1cm length x 0.2cm width x 0.4cm depth; 0.157cm^2 area and 0.063cm^3 volume. There is no tunneling or undermining noted. There is a medium amount of serous drainage noted. The wound margin is flat and intact. There is medium (34-66%) pink granulation within the wound bed. There is a medium (34-66%) amount of necrotic tissue within the wound bed including Adherent Slough. The periwound skin appearance exhibited: Callus. The periwound skin appearance did not exhibit: Crepitus, Excoriation, Induration, Rash, Scarring, Dry/Scaly, Maceration, Atrophie Blanche, Cyanosis, Ecchymosis, Hemosiderin Staining, Mottled, Pallor, Rubor, Erythema. The periwound has tenderness on palpation. Wound #3 status is Open. Original cause of wound was Surgical Injury. The wound is located on the Right,Plantar Calcaneus. The wound measures 1cm length x 0.5cm width x 0.1cm depth; 0.393cm^2 area and 0.039cm^3 volume. There is no tunneling or undermining noted. There is a medium amount of serous drainage noted. The wound margin is flat and intact. There is medium (34-66%) pink granulation within the wound bed. There is a medium (34-66%) amount of necrotic tissue within the wound bed including Adherent Slough. The periwound skin appearance exhibited: Callus. The periwound skin appearance did not exhibit: Crepitus, Excoriation, Induration, Rash, Scarring, Dry/Scaly, Maceration, Atrophie Blanche, Cyanosis, Ecchymosis, Hemosiderin Staining, Mottled, Pallor, Rubor, Erythema. The periwound has tenderness on palpation. Wound #4  status is Open. Original cause of wound was Surgical Injury. The wound is located on the Benson. The wound measures 0.2cm length x 2.3cm width x 0.1cm depth; 0.361cm^2 area  and 0.036cm^3 volume. There is no tunneling or undermining noted. There is a large amount of serous drainage noted. The wound margin is flat and intact. There is medium (34-66%) pink granulation within the wound bed. There is a medium (34-66%) amount of necrotic tissue within the wound bed including Adherent Slough. The periwound skin appearance exhibited: Callus. The periwound skin appearance did not exhibit: Crepitus, Excoriation, Induration, Rash, Scarring, Dry/Scaly, Maceration, Atrophie Blanche, Cyanosis, Gladwin, Annemarie ([id number]) Ecchymosis, Hemosiderin Staining, Mottled, Pallor, Rubor, Erythema. The periwound has tenderness on palpation. Assessment Active Problems ICD-10 L84 - Corns and callosities L97.518 - Non-pressure chronic ulcer of other part of right foot with other specified severity T81.31XA - Disruption of external operation (surgical) wound, not elsewhere classified, initial encounter -unable to adequately assess secondary to hypersensitivity to touch; she pulls away with minimal touch -She continues to have sutures present in the right hallux and right plantar fifth metatarsal area, unable to remove sutures secondary to her inability to tolerate touch -Unable to do brief calluses secondary to hypersensitivity -We discussed the need for suture removal, callus debridement and wound assessment for optimal treatment -We will attempt to soften callouses for easier debridement; contact podiatry, obtain operative notes and any pathology reports to get a better understanding of surgical intervention as the patient and friend/family states she was in the OR for 3-4 hours for a "excision of benign lesion" on postop visit summaries; there are excision/suture lines noted to the right plantar heel, right medial hallux, right plantar foot at the fifth metatarsal area, left plantar heel, left plantar foot (the left foot surgical sites appear healed and callused over) -she has a follow  up appointment with poditary in September -as of this dictation I have been unable to reach Dr. Cannon Kettle Plan Wound Cleansing: Wound #1 Right Toe Great: Cleanse wound with mild soap and water May Shower, gently pat wound dry prior to applying new dressing. Wound #2 Right,Plantar Metatarsal head fifth: Cleanse wound with mild soap and water CODY, OLIGER ([id number]) May Shower, gently pat wound dry prior to applying new dressing. Wound #3 Right,Plantar Calcaneus: Cleanse wound with mild soap and water May Shower, gently pat wound dry prior to applying new dressing. Wound #4 Left,Plantar Foot: Cleanse wound with mild soap and water May Shower, gently pat wound dry prior to applying new dressing. Anesthetic: Wound #1 Right Toe Great: Topical Lidocaine 4% cream applied to wound bed prior to debridement Wound #2 Right,Plantar Metatarsal head fifth: Topical Lidocaine 4% cream applied to wound bed prior to debridement Wound #3 Right,Plantar Calcaneus: Topical Lidocaine 4% cream applied to wound bed prior to debridement Wound #4 Left,Plantar Foot: Topical Lidocaine 4% cream applied to wound bed prior to debridement Primary Wound Dressing: Wound #1 Right Toe Great: Hydrogel Wound #2 Right,Plantar Metatarsal head fifth: Hydrogel Wound #3 Right,Plantar Calcaneus: Hydrogel Wound #4 Left,Plantar Foot: Hydrogel Secondary Dressing: Wound #1 Right Toe Great: Other - coverlet Wound #2 Right,Plantar Metatarsal head fifth: Other - coverlet Wound #3 Right,Plantar Calcaneus: Other - coverlet Wound #4 Left,Plantar Foot: Other - coverlet Dressing Change Frequency: Wound #1 Right Toe Great: Change dressing every day. Wound #2 Right,Plantar Metatarsal head fifth: Change dressing every day. Wound #3 Right,Plantar Calcaneus: Change dressing every day. Wound #4 Left,Plantar Foot: Change dressing every day. Follow-up Appointments: Wound #1 Right Toe Great:  Return Appointment in 1 week. Wound  #2 Right,Plantar Metatarsal head fifth: Return Appointment in 1 week. Wound #3 Right,Plantar Calcaneus: Return Appointment in 1 week. Wound #4 Left,Plantar Foot: DYLLAN, KATS ([id number]) Return Appointment in 1 week. Off-Loading: Wound #1 Right Toe Great: Other: - Please stay off your feet as much as possible Wound #2 Right,Plantar Metatarsal head fifth: Other: - Please stay off your feet as much as possible Wound #3 Right,Plantar Calcaneus: Other: - Please stay off your feet as much as possible Wound #4 Left,Plantar Foot: Other: - Please stay off your feet as much as possible Additional Orders / Instructions: Wound #1 Right Toe Great: Increase protein intake. Other: - Please add vitamin A, vitamin C and zinc supplements to your diet Wound #2 Right,Plantar Metatarsal head fifth: Increase protein intake. Other: - Please add vitamin A, vitamin C and zinc supplements to your diet Wound #3 Right,Plantar Calcaneus: Increase protein intake. Other: - Please add vitamin A, vitamin C and zinc supplements to your diet Wound #4 Left,Plantar Foot: Increase protein intake. Other: - Please add vitamin A, vitamin C and zinc supplements to your diet Radiology ordered were: X-ray, foot - bilateral 1. X-ray to bilateral feet 2. hydrogel to all callus areas Electronic Signature(s) Signed: 11/08/2016 4:42:46 PM By: Lawanda Cousins Previous Signature: 11/05/2016 5:23:40 PM Version By: Lawanda Cousins Entered By: Lawanda Cousins on 11/08/2016 16:42:46 [name] ([id number]) -------------------------------------------------------------------------------- ROS/PFSH Details Patient Name: [name] Date of Service: 11/05/2016 8:00 AM Medical Record Number: [id number] Patient Account Number: [id number] Date of Birth/Sex: 1956/01/02 (60 y.o. Female) Treating RN: Montey Hora Primary Care Provider: Eliezer Lofts Other Clinician: Referring Provider: Landis Martins Treating Provider/Extender: Cathie Olden in Treatment: 0 Information Obtained From Patient Chart Wound History Do you currently have one or more open woundso Yes How many open wounds do you currently haveo 2 Approximately how long have you had your woundso 9 weeks How have you been treating your wound(s) until nowo Prisma Has your wound(s) ever healed and then re-openedo No Have you had any lab work done in the past montho No Have you tested positive for an antibiotic resistant organism (MRSA, VRE)o No Constitutional Symptoms (General Health) Complaints and Symptoms: Negative for: Fatigue; Fever; Chills; Marked Weight Change Medical History: Past Medical History Notes: Bipolar; Neuropathy, COPA, Water pill; Thyroid Eyes Complaints and Symptoms: Positive for: Glasses / Contacts - glasses Medical History: Negative for: Cataracts; Glaucoma; Optic Neuritis Cardiovascular Complaints and Symptoms: Positive for: LE edema - sometimes Medical History: Negative for: Angina; Arrhythmia; Congestive Heart Failure; Coronary Artery Disease; Deep Vein Thrombosis; Hypertension; Hypotension; Myocardial Infarction; Peripheral Arterial Disease; Peripheral Venous Disease; Phlebitis; Vasculitis Endocrine Complaints and Symptoms: Positive for: Thyroid disease Negative for: Hepatitis; Polydypsia (Excessive Thirst) KATELINN, JUSTICE ([id number]) Medical History: Negative for: Type I Diabetes; Type II Diabetes Genitourinary Complaints and Symptoms: No Complaints or Symptoms Complaints and Symptoms: Negative for: Kidney failure/ Dialysis; Incontinence/dribbling Medical History: Negative for: End Stage Renal Disease Integumentary (Skin) Complaints and Symptoms: Positive for: Wounds Negative for: Bleeding or bruising tendency; Breakdown; Swelling Medical History: Negative for: History of Burn; History of pressure wounds Psychiatric Complaints and Symptoms: Positive for: Anxiety Negative for: Claustrophobia Medical  History: Negative for: Anorexia/bulimia; Confinement Anxiety Ear/Nose/Mouth/Throat Complaints and Symptoms: No Complaints or Symptoms Medical History: Negative for: Chronic sinus problems/congestion; Middle ear problems Hematologic/Lymphatic Complaints and Symptoms: No Complaints or Symptoms Medical History: Negative for: Anemia; Hemophilia; Human Immunodeficiency Virus; Lymphedema; Sickle Cell Disease Respiratory Gilson, Ashaunte ([id number]) Complaints and Symptoms: No Complaints or Symptoms  Medical History: Positive for: Chronic Obstructive Pulmonary Disease (COPD) Negative for: Aspiration; Asthma; Pneumothorax; Sleep Apnea; Tuberculosis Gastrointestinal Complaints and Symptoms: No Complaints or Symptoms Medical History: Negative for: Cirrhosis ; Colitis; Crohnos; Hepatitis A; Hepatitis B; Hepatitis C Immunological Complaints and Symptoms: No Complaints or Symptoms Medical History: Negative for: Lupus Erythematosus; Raynaudos; Scleroderma Musculoskeletal Complaints and Symptoms: No Complaints or Symptoms Medical History: Negative for: Gout; Rheumatoid Arthritis; Osteoarthritis; Osteomyelitis Neurologic Complaints and Symptoms: No Complaints or Symptoms Medical History: Positive for: Neuropathy - Feet only Negative for: Dementia; Quadriplegia; Paraplegia; Seizure Disorder Oncologic Complaints and Symptoms: No Complaints or Symptoms Medical History: Negative for: Received Chemotherapy; Received Radiation Immunizations Pneumococcal Vaccine: Received Pneumococcal Vaccination: No SHAMIAH, KAHLER ([id number]) Family and Social History Cancer: Yes - Mother, Siblings, Father; Diabetes: Yes - Father; Heart Disease: Yes - Mother, Father; Hypertension: Yes - Mother, Father; Kidney Disease: Yes - Mother; Lung Disease: No; Seizures: No; Stroke: No; Thyroid Problems: No; Tuberculosis: No; Current every day smoker - 1/2 pack daily; Marital Status - Divorced; Alcohol Use: Never; Drug  Use: No History; Caffeine Use: Daily; Advanced Directives: No; Patient does not want information on Advanced Directives; Do not resuscitate: No; Living Will: No; Medical Power of Attorney: No Electronic Signature(s) Signed: 11/05/2016 5:23:40 PM By: Lawanda Cousins Signed: 11/05/2016 5:39:18 PM By: Montey Hora Entered By: Montey Hora on 11/05/2016 08:14:41 [name] ([id number]) -------------------------------------------------------------------------------- SuperBill Details Patient Name: [name] Date of Service: 11/05/2016 Medical Record Number: [id number] Patient Account Number: [id number] Date of Birth/Sex: 11-Nov-1955 (60 y.o. Female) Treating RN: Montey Hora Primary Care Provider: Eliezer Lofts Other Clinician: Referring Provider: Landis Martins Treating Provider/Extender: Cathie Olden in Treatment: 0 Diagnosis Coding ICD-10 Codes Code Description L84 Corns and callosities L97.518 Non-pressure chronic ulcer of other part of right foot with other specified severity Disruption of external operation (surgical) wound, not elsewhere classified, initial T81.31XA encounter Facility Procedures CPT4 Code: [id number] Description: [phone] - WOUND CARE VISIT-LEV 5 EST PT Modifier: Quantity: 1 Physician Procedures CPT4: Description Modifier Quantity Code [id number] WC PHYS LEVEL 3 o NEW PT 1 ICD-10 Description Diagnosis L97.518 Non-pressure chronic ulcer of other part of right foot with other specified severity T81.31XA Disruption of external operation (surgical)  wound, not elsewhere classified, initial encounter L84 Corns and callosities Electronic Signature(s) Signed: 11/05/2016 4:59:12 PM By: Montey Hora Signed: 11/05/2016 5:23:40 PM By: Lawanda Cousins Previous Signature: 11/05/2016 1:29:40 PM Version By: Lawanda Cousins Entered By: Montey Hora on 11/05/2016 16:59:12

## 2016-11-06 NOTE — Progress Notes (Signed)
Monica, [name] ([id number]) Visit Report for 11/05/2016 Abuse/Suicide Risk Screen Details Patient Name: [name], [name] Date of Service: 11/05/2016 8:00 AM Medical Record Number: [id number] Patient Account Number: [id number] Date of Birth/Sex: 11-12-1955 (60 y.o. Female) Treating RN: Montey Hora Primary Care Sebrina Kessner: Eliezer Lofts Other Clinician: Referring Jessicamarie Amiri: Eliezer Lofts Treating Cletis Muma/Extender: Cathie Olden in Treatment: 0 Abuse/Suicide Risk Screen Items Answer ABUSE/SUICIDE RISK SCREEN: Has anyone close to you tried to hurt or harm you recentlyo No Do you feel uncomfortable with anyone in your familyo No Has anyone forced you do things that you didnot want to doo No Do you have any thoughts of harming yourselfo No Patient displays signs or symptoms of abuse and/or neglect. No Electronic Signature(s) Signed: 11/05/2016 5:39:18 PM By: Montey Hora Entered By: Montey Hora on 11/05/2016 08:14:50 [name] ([id number]) -------------------------------------------------------------------------------- Activities of Daily Living Details Patient Name: [name], [name] Date of Service: 11/05/2016 8:00 AM Medical Record Number: [id number] Patient Account Number: [id number] Date of Birth/Sex: [birth year] (60 y.o. Female) Treating RN: Montey Hora Primary Care Liesel Peckenpaugh: Eliezer Lofts Other Clinician: Referring Wilman Tucker: Eliezer Lofts Treating Trae Bovenzi/Extender: Cathie Olden in Treatment: 0 Activities of Daily Living Items Answer Activities of Daily Living (Please select one for each item) Drive Automobile Completely Able Take Medications Completely Able Use Telephone Completely Able Care for Appearance Completely Able Use Toilet Completely Able Bath / Shower Completely Able Dress Self Completely Able Feed Self Completely Able Walk Completely Able Get In / Out Bed Completely Able Housework Completely Able Prepare Meals Completely [place] for Self Completely Able Electronic Signature(s) Signed: 11/05/2016 5:39:18 PM By: Montey Hora Entered By: Montey Hora on 11/05/2016 08:14:58 [name] ([id number]) -------------------------------------------------------------------------------- Education Assessment Details Patient Name: [name] Date of Service: 11/05/2016 8:00 AM Medical Record Number: [id number] Patient Account Number: [id number] Date of Birth/Sex: 1955/11/09 (60 y.o. Female) Treating RN: Montey Hora Primary Care Benjamyn Hestand: Eliezer Lofts Other Clinician: Referring Tallula Grindle: Eliezer Lofts Treating Avion Kutzer/Extender: Cathie Olden in Treatment: 0 Primary Learner Assessed: Patient Learning Preferences/Education Level/Primary Language Highest Education Level: High School Preferred Language: English Cognitive Barrier Assessment/Beliefs Language Barrier: No Translator Needed: No Memory Deficit: No Emotional Barrier: No Cultural/Religious Beliefs Affecting Medical No Care: Physical Barrier Assessment Impaired Vision: No Impaired Hearing: No Decreased Hand dexterity: No Knowledge/Comprehension Assessment Knowledge Level: Medium Comprehension Level: Medium Ability to understand written Medium instructions: Motivation Assessment Anxiety Level: Calm Cooperation: Cooperative Education Importance: Acknowledges Need Interest in Health Problems: Uninterested Perception: Coherent Willingness to Engage in Self- Medium Management Activities: Readiness to Engage in Self- Medium Management Activities: Electronic Signature(s) Signed: 11/05/2016 5:39:18 PM By: Montey Hora Entered By: Montey Hora on 11/05/2016 08:15:30 [name] ([id number]) [name], [name] ([id number]) -------------------------------------------------------------------------------- Fall Risk Assessment Details Patient Name: [name] Date of Service: 11/05/2016 8:00 AM Medical Record Number: [id number] Patient  Account Number: [id number] Date of Birth/Sex: 1955/05/12 (60 y.o. Female) Treating RN: Montey Hora Primary Care Burdette Forehand: Eliezer Lofts Other Clinician: Referring Payge Eppes: Eliezer Lofts Treating Ezreal Turay/Extender: Cathie Olden in Treatment: 0 Fall Risk Assessment Items Have you had 2 or more falls in the last 12 monthso 0 No Have you had any fall that resulted in injury in the last 12 monthso 0 No FALL RISK ASSESSMENT: History of falling - immediate or within 3 months 0 No Secondary diagnosis 0 No Ambulatory aid None/bed rest/wheelchair/nurse 0 Yes Crutches/cane/walker 0 No Furniture 0 No IV Access/Saline Lock 0 No Gait/Training Normal/bed rest/immobile 0 Yes Weak 0 No Impaired 0 No Mental Status Oriented  to own ability 0 Yes Electronic Signature(s) Signed: 11/05/2016 5:39:18 PM By: Montey Hora Entered By: Montey Hora on 11/05/2016 08:15:48 [name] ([id number]) -------------------------------------------------------------------------------- Foot Assessment Details Patient Name: [name] Date of Service: 11/05/2016 8:00 AM Medical Record Number: [id number] Patient Account Number: [id number] Date of Birth/Sex: 25-Jan-1956 (60 y.o. Female) Treating RN: Montey Hora Primary Care Rydge Texidor: Eliezer Lofts Other Clinician: Referring Jameka Ivie: Eliezer Lofts Treating Moishe Schellenberg/Extender: Cathie Olden in Treatment: 0 Foot Assessment Items Site Locations + = Sensation present, - = Sensation absent, C = Callus, U = Ulcer R = Redness, W = Warmth, M = Maceration, PU = Pre-ulcerative lesion F = Fissure, S = Swelling, D = Dryness Assessment Right: Left: Other Deformity: No No Prior Foot Ulcer: No No Prior Amputation: No No Charcot Joint: No No Ambulatory Status: Ambulatory Without Help Gait: Steady Electronic Signature(s) Signed: 11/05/2016 5:39:18 PM By: Montey Hora Entered By: Montey Hora on 11/05/2016 08:18:36 [name]  ([id number]) -------------------------------------------------------------------------------- Nutrition Risk Assessment Details Patient Name: [name] Date of Service: 11/05/2016 8:00 AM Medical Record Number: [id number] Patient Account Number: [id number] Date of Birth/Sex: 05/02/1955 (60 y.o. Female) Treating RN: Montey Hora Primary Care Gordana Kewley: Eliezer Lofts Other Clinician: Referring Dajon Rowe: Eliezer Lofts Treating Tayveon Lombardo/Extender: Cathie Olden in Treatment: 0 Height (in): 63 Weight (lbs): 149 Body Mass Index (BMI): 26.4 Nutrition Risk Assessment Items NUTRITION RISK SCREEN: I have an illness or condition that made me change the kind and/or 0 No amount of food I eat I eat fewer than two meals per day 3 Yes I eat few fruits and vegetables, or milk products 0 No I have three or more drinks of beer, liquor or wine almost every day 0 No I have tooth or mouth problems that make it hard for me to eat 0 No I don't always have enough money to buy the food I need 0 No I eat alone most of the time 0 No I take three or more different prescribed or over-the-counter drugs a 1 Yes day Without wanting to, I have lost or gained 10 pounds in the last six 0 No months I am not always physically able to shop, cook and/or feed myself 0 No Nutrition Protocols Good Risk Protocol Provide education on Moderate Risk Protocol 0 nutrition Electronic Signature(s) Signed: 11/05/2016 5:39:18 PM By: Montey Hora Entered By: Montey Hora on 11/05/2016 08:16:12

## 2016-11-07 DIAGNOSIS — F310 Bipolar disorder, current episode hypomanic: Secondary | ICD-10-CM | POA: Diagnosis not present

## 2016-11-07 DIAGNOSIS — R69 Illness, unspecified: Secondary | ICD-10-CM | POA: Diagnosis not present

## 2016-11-12 ENCOUNTER — Telehealth: Payer: Self-pay | Admitting: Family Medicine

## 2016-11-12 ENCOUNTER — Encounter: Payer: Medicare HMO | Admitting: Surgery

## 2016-11-12 ENCOUNTER — Telehealth: Payer: Self-pay | Admitting: Sports Medicine

## 2016-11-12 DIAGNOSIS — L97522 Non-pressure chronic ulcer of other part of left foot with fat layer exposed: Secondary | ICD-10-CM | POA: Diagnosis not present

## 2016-11-12 DIAGNOSIS — T8131XA Disruption of external operation (surgical) wound, not elsewhere classified, initial encounter: Secondary | ICD-10-CM | POA: Diagnosis not present

## 2016-11-12 DIAGNOSIS — R69 Illness, unspecified: Secondary | ICD-10-CM | POA: Diagnosis not present

## 2016-11-12 DIAGNOSIS — Z885 Allergy status to narcotic agent status: Secondary | ICD-10-CM | POA: Diagnosis not present

## 2016-11-12 DIAGNOSIS — Y839 Surgical procedure, unspecified as the cause of abnormal reaction of the patient, or of later complication, without mention of misadventure at the time of the procedure: Secondary | ICD-10-CM | POA: Diagnosis not present

## 2016-11-12 DIAGNOSIS — Z886 Allergy status to analgesic agent status: Secondary | ICD-10-CM | POA: Diagnosis not present

## 2016-11-12 DIAGNOSIS — G629 Polyneuropathy, unspecified: Secondary | ICD-10-CM | POA: Diagnosis not present

## 2016-11-12 DIAGNOSIS — Z880 Allergy status to penicillin: Secondary | ICD-10-CM | POA: Diagnosis not present

## 2016-11-12 DIAGNOSIS — J449 Chronic obstructive pulmonary disease, unspecified: Secondary | ICD-10-CM | POA: Diagnosis not present

## 2016-11-12 DIAGNOSIS — L84 Corns and callosities: Secondary | ICD-10-CM | POA: Diagnosis not present

## 2016-11-12 DIAGNOSIS — S91302A Unspecified open wound, left foot, initial encounter: Secondary | ICD-10-CM | POA: Diagnosis not present

## 2016-11-12 DIAGNOSIS — L97512 Non-pressure chronic ulcer of other part of right foot with fat layer exposed: Secondary | ICD-10-CM | POA: Diagnosis not present

## 2016-11-12 DIAGNOSIS — L97518 Non-pressure chronic ulcer of other part of right foot with other specified severity: Secondary | ICD-10-CM | POA: Diagnosis not present

## 2016-11-12 NOTE — Telephone Encounter (Signed)
Pt called asking for an appt with Dr Paulla Dolly to discuss her foot that Dr Cannon Kettle did surgery on. Pt stated she went to woundcare and they cannot do anything for her.

## 2016-11-12 NOTE — Telephone Encounter (Signed)
I spoke with pt and she was doing yard work prior to breaking out with rash on 11/11/16. Pt is itching very badly and hand is swollen with blistering. No difficulty breathing and no swelling in mouth or throat. Pt will go to [facility] now. FYI to Dr Diona Browner.

## 2016-11-12 NOTE — Telephone Encounter (Signed)
She can see him for a second opinion if she likes -Dr. Cannon Kettle

## 2016-11-12 NOTE — Telephone Encounter (Signed)
Patient Name: [name]  DOB: 03-13-1956    Initial Comment caller states she has a rash that is widespread. her hand is swollen and she has a knot on the side of her arm.    Nurse Assessment  Nurse: Genoveva Ill, RN, Lattie Haw Date/Time (Eastern Time): 11/12/2016 4:28:28 PM  Confirm and document reason for call. If symptomatic, describe symptoms. ---caller states she has a rash that is widespread since yesterday, left hand is swollen and she has a knot on the side of her arm; both hand have blisters; rash with redness and itching, left jaw, left upper leg, back of neck; does not know what it could be  Does the patient have any new or worsening symptoms? ---Yes  Will a triage be completed? ---Yes  Related visit to physician within the last 2 weeks? ---No  Does the PT have any chronic conditions? (i.e. diabetes, asthma, etc.) ---No  Is this a behavioral health or substance abuse call? ---No     Guidelines    Guideline Title Affirmed Question Affirmed Notes  Rash or Redness - Widespread Joint pain or swelling    Final Disposition User   Go to ED Now (or PCP triage) [place], RN, Lattie Haw    Comments  caller refused ER/UC today, advised note will be placed on chart and someone will call back   Referrals  GO TO FACILITY REFUSED   Disagree/Comply: Disagree  Disagree/Comply Reason: Disagree with instructions

## 2016-11-13 NOTE — Progress Notes (Signed)
LODIE, WAHEED ([id number]) Visit Report for 11/12/2016 Chief Complaint Document Details Patient Name: [name], [name] Date of Service: 11/12/2016 9:45 AM Medical Record Number: [id number] Patient Account Number: [id number] Date of Birth/Sex: 1955-07-21 (61 y.o. Female) Treating RN: Phillis Haggis Primary Care Provider: Kerby Nora Other Clinician: Referring Provider: Kerby Nora Treating Provider/Extender: Rudene Re in Treatment: 1 Information Obtained from: Patient Chief Complaint She is here for initial evaluation of Right foot ulcers Electronic Signature(s) Signed: 11/12/2016 3:16:27 PM By: Evlyn Kanner MD, FACS Entered By: Evlyn Kanner on 11/12/2016 10:30:24 [name] ([id number]) -------------------------------------------------------------------------------- HPI Details Patient Name: [name] Date of Service: 11/12/2016 9:45 AM Medical Record Number: [id number] Patient Account Number: [id number] Date of Birth/Sex: Mar 29, 1955 (60 y.o. Female) Treating RN: Ashok Cordia, Debi Primary Care Provider: Kerby Nora Other Clinician: Referring Provider: Kerby Nora Treating Provider/Extender: Rudene Re in Treatment: 1 History of Present Illness Location: multiple site right foot Quality: sharp Severity: 10 Timing: chronic Context: surgically Modifying Factors: pressure, pain, infection are exacerbating conditions HPI Description: 11/05/16- [name] is here for initial evaluation s/p excision of hyperkeratotic lesions per Dr. Marylene Land, DPM on 09/10/16. She originally went to podiatry in April with c/o painful calluses, multiple in office attempts were made for debridement; she ultimately went to the OR for excision and primary closure of these lesions on 09/10/16. She has had multiple post-op visits with removal of most sutures along with dehisence. She was given clindamycin x1 (10/09/16). She reports no radiolgraphic studies have been ordered/performed. She states  that her pain has become progressive with nerve damage to right toes 4-5 since surgical intervention. She does not tolerate light touch and is unable to tolerate any attempt at callus shaving, suture removal or wound interrogation. She withdraws with multiple attempts, although partial removal of suture to the right medial hallux. I have no operative or pathology reports to review and have been unable to reach the office at the time of dictation, 11/12/2016 --x-rays ordered her last visit here, show that the right foot was within normal limits and the left foot showed hardware in the first metatarsal area but there were no acute bony changes. Have reviewed some notes regarding her operative surgery which was done on 09/10/2016 which essentially was for porokeratosis and excision of skin and subcutis lesion was done on the right foot and left foot under anesthesia. Indication was painful lesion on both feet not relieved by consultative care. Benign lesions o4 were removed from the right foot and closed primarily. 3-0 nylon was used and benign lesion o3 on the left foot for identified and removed. These were also sutured closed. Electronic Signature(s) Signed: 11/12/2016 3:16:27 PM By: Evlyn Kanner MD, FACS Entered By: Evlyn Kanner on 11/12/2016 10:34:43 [name], [name] ([id number]) -------------------------------------------------------------------------------- Physical Exam Details Patient Name: [name] Date of Service: 11/12/2016 9:45 AM Medical Record Number: [id number] Patient Account Number: [id number] Date of Birth/Sex: October 28, 1955 (60 y.o. Female) Treating RN: Phillis Haggis Primary Care Provider: Kerby Nora Other Clinician: Referring Provider: Kerby Nora Treating Provider/Extender: Rudene Re in Treatment: 1 Constitutional . Pulse regular. Respirations normal and unlabored. Afebrile. . Eyes Nonicteric. Reactive to light. Ears, Nose, Mouth, and Throat Lips, teeth,  and gums WNL.Marland Kitchen Moist mucosa without lesions. Neck supple and nontender. No palpable supraclavicular or cervical adenopathy. Normal sized without goiter. Respiratory WNL. No retractions.. Cardiovascular Pedal Pulses WNL. No clubbing, cyanosis or edema. Lymphatic No adneopathy. No adenopathy. No adenopathy. Musculoskeletal Adexa without tenderness or enlargement.. Digits and nails w/o clubbing, cyanosis, infection, petechiae, ischemia,  or inflammatory conditions.. Integumentary (Hair, Skin) No suspicious lesions. No crepitus or fluctuance. No peri-wound warmth or erythema. No masses.Marland Kitchen Psychiatric Judgement and insight Intact.. No evidence of depression, anxiety, or agitation.. Notes on the right foot the patient has multiple calluses which need sharp debridement but she is extremely sensitive to touch and I'm unable to examine her. On the lateral plantar fifth metatarsal head she has a suture which is still intact but the patient will not let me remove this. He has multiple calluses on the left foot which again are extremely sensitive and she will not tell me to examine her. Electronic Signature(s) Signed: 11/12/2016 3:16:27 PM By: Evlyn Kanner MD, FACS Entered By: Evlyn Kanner on 11/12/2016 10:36:53 [name] ([id number]) -------------------------------------------------------------------------------- Physician Orders Details Patient Name: [name] Date of Service: 11/12/2016 9:45 AM Medical Record Number: [id number] Patient Account Number: [id number] Date of Birth/Sex: 05/08/1955 (60 y.o. Female) Treating RN: Ashok Cordia, Debi Primary Care Provider: Kerby Nora Other Clinician: Referring Provider: Kerby Nora Treating Provider/Extender: Rudene Re in Treatment: 1 Verbal / Phone Orders: Yes Clinician: Ashok Cordia, Debi Read Back and Verified: Yes Diagnosis Coding Discharge From [facility] Services Wound #1 Right Toe Great o Discharge from Wound Care Center - Find a  podiatrist of your choice. May ask your primary care doctor for a referral or see another podiatrist in the same practice as your podiatrist. Please call our office if you have any questions or concerns. Wound #2 Right,Plantar Metatarsal head fifth o Discharge from Wound Care Center - Find a podiatrist of your choice. May ask your primary care doctor for a referral or see another podiatrist in the same practice as your podiatrist. Please call our office if you have any questions or concerns. Wound #3 Right,Plantar Calcaneus o Discharge from Wound Care Center - Find a podiatrist of your choice. May ask your primary care doctor for a referral or see another podiatrist in the same practice as your podiatrist. Please call our office if you have any questions or concerns. Wound #4 Left,Plantar Foot o Discharge from Wound Care Center - Find a podiatrist of your choice. May ask your primary care doctor for a referral or see another podiatrist in the same practice as your podiatrist. Please call our office if you have any questions or concerns. Electronic Signature(s) Signed: 11/12/2016 3:16:27 PM By: Evlyn Kanner MD, FACS Signed: 11/12/2016 4:56:27 PM By: Alejandro Mulling Entered By: Alejandro Mulling on 11/12/2016 10:26:31 [name], [name] ([id number]) -------------------------------------------------------------------------------- Problem List Details Patient Name: [name] Date of Service: 11/12/2016 9:45 AM Medical Record Number: [id number] Patient Account Number: [id number] Date of Birth/Sex: [birth year] (60 y.o. Female) Treating RN: Ashok Cordia, Debi Primary Care Provider: Kerby Nora Other Clinician: Referring Provider: Kerby Nora Treating Provider/Extender: Rudene Re in Treatment: 1 Active Problems ICD-10 Encounter Code Description Active Date Diagnosis L84 Corns and callosities 11/05/2016 Yes L97.518 Non-pressure chronic ulcer of other part of right foot with  11/05/2016 Yes other specified severity T81.31XA Disruption of external operation (surgical) wound, not 11/05/2016 Yes elsewhere classified, initial encounter Inactive Problems Resolved Problems Electronic Signature(s) Signed: 11/12/2016 3:16:27 PM By: Evlyn Kanner MD, FACS Entered By: Evlyn Kanner on 11/12/2016 10:30:11 [name] ([id number]) -------------------------------------------------------------------------------- Progress Note Details Patient Name: [name] Date of Service: 11/12/2016 9:45 AM Medical Record Number: [id number] Patient Account Number: [id number] Date of Birth/Sex: 06/07/1955 (60 y.o. Female) Treating RN: Ashok Cordia, Debi Primary Care Provider: Kerby Nora Other Clinician: Referring Provider: Kerby Nora Treating Provider/Extender: Rudene Re in Treatment: 1 Subjective Chief Complaint Information  obtained from Patient She is here for initial evaluation of Right foot ulcers History of Present Illness (HPI) The following HPI elements were documented for the patient's wound: Location: multiple site right foot Quality: sharp Severity: 10 Timing: chronic Context: surgically Modifying Factors: pressure, pain, infection are exacerbating conditions 11/05/16- Ms. Plaisted is here for initial evaluation s/p excision of hyperkeratotic lesions per Dr. Marylene Land, DPM on 09/10/16. She originally went to podiatry in April with c/o painful calluses, multiple in office attempts were made for debridement; she ultimately went to the OR for excision and primary closure of these lesions on 09/10/16. She has had multiple post-op visits with removal of most sutures along with dehisence. She was given clindamycin x1 (10/09/16). She reports no radiolgraphic studies have been ordered/performed. She states that her pain has become progressive with nerve damage to right toes 4-5 since surgical intervention. She does not tolerate light touch and is unable to tolerate any attempt  at callus shaving, suture removal or wound interrogation. She withdraws with multiple attempts, although partial removal of suture to the right medial hallux. I have no operative or pathology reports to review and have been unable to reach the office at the time of dictation, 11/12/2016 --x-rays ordered her last visit here, show that the right foot was within normal limits and the left foot showed hardware in the first metatarsal area but there were no acute bony changes. Have reviewed some notes regarding her operative surgery which was done on 09/10/2016 which essentially was for porokeratosis and excision of skin and subcutis lesion was done on the right foot and left foot under anesthesia. Indication was painful lesion on both feet not relieved by consultative care. Benign lesions fo4 were removed from the right foot and closed primarily. 3-0 nylon was used and benign lesion fo3 on the left foot for identified and removed. These were also sutured closed. [name], [name] ([id number]) Objective Constitutional Pulse regular. Respirations normal and unlabored. Afebrile. Vitals Time Taken: 9:55 AM, Height: 63 in, Weight: 149 lbs, BMI: 26.4, Temperature: 97.7 F, Pulse: 67 bpm, Respiratory Rate: 18 breaths/min, Blood Pressure: 109/51 mmHg. Eyes Nonicteric. Reactive to light. Ears, Nose, Mouth, and Throat Lips, teeth, and gums WNL.Marland Kitchen Moist mucosa without lesions. Neck supple and nontender. No palpable supraclavicular or cervical adenopathy. Normal sized without goiter. Respiratory WNL. No retractions.. Cardiovascular Pedal Pulses WNL. No clubbing, cyanosis or edema. Lymphatic No adneopathy. No adenopathy. No adenopathy. Musculoskeletal Adexa without tenderness or enlargement.. Digits and nails w/o clubbing, cyanosis, infection, petechiae, ischemia, or inflammatory conditions.Marland Kitchen Psychiatric Judgement and insight Intact.. No evidence of depression, anxiety, or agitation.. General Notes: on  the right foot the patient has multiple calluses which need sharp debridement but she is extremely sensitive to touch and I'm unable to examine her. On the lateral plantar fifth metatarsal head she has a suture which is still intact but the patient will not let me remove this. He has multiple calluses on the left foot which again are extremely sensitive and she will not tell me to examine her. Integumentary (Hair, Skin) No suspicious lesions. No crepitus or fluctuance. No peri-wound warmth or erythema. No masses.. Wound #1 status is Open. Original cause of wound was Surgical Injury. The wound is located on the Right Toe Great. The wound measures 0.8cm length x 0.2cm width x 0.1cm depth; 0.126cm^2 area and 0.013cm^3 volume. There is no tunneling or undermining noted. There is a medium amount of serous drainage noted. The wound margin is flat and intact. There is medium (  34-66%) pink granulation within the [place], [place] ([id number]) wound bed. There is a medium (34-66%) amount of necrotic tissue within the wound bed including Adherent Slough. The periwound skin appearance exhibited: Callus. The periwound skin appearance did not exhibit: Crepitus, Excoriation, Induration, Rash, Scarring, Dry/Scaly, Maceration, Atrophie Blanche, Cyanosis, Ecchymosis, Hemosiderin Staining, Mottled, Pallor, Rubor, Erythema. The periwound has tenderness on palpation. Wound #2 status is Open. Original cause of wound was Surgical Injury. The wound is located on the Right,Plantar Metatarsal head fifth. The wound measures 1cm length x 0.2cm width x 0.4cm depth; 0.157cm^2 area and 0.063cm^3 volume. There is no tunneling or undermining noted. There is a medium amount of serous drainage noted. The wound margin is flat and intact. There is medium (34-66%) pink granulation within the wound bed. There is a medium (34-66%) amount of necrotic tissue within the wound bed including Adherent Slough. The periwound skin appearance  exhibited: Callus. The periwound skin appearance did not exhibit: Crepitus, Excoriation, Induration, Rash, Scarring, Dry/Scaly, Maceration, Atrophie Blanche, Cyanosis, Ecchymosis, Hemosiderin Staining, Mottled, Pallor, Rubor, Erythema. The periwound has tenderness on palpation. Wound #3 status is Open. Original cause of wound was Surgical Injury. The wound is located on the Right,Plantar Calcaneus. The wound measures 0.4cm length x 0.3cm width x 0.1cm depth; 0.094cm^2 area and 0.009cm^3 volume. There is no tunneling or undermining noted. There is a medium amount of serous drainage noted. The wound margin is flat and intact. There is medium (34-66%) pink granulation within the wound bed. There is a medium (34-66%) amount of necrotic tissue within the wound bed including Adherent Slough. The periwound skin appearance exhibited: Callus. The periwound skin appearance did not exhibit: Crepitus, Excoriation, Induration, Rash, Scarring, Dry/Scaly, Maceration, Atrophie Blanche, Cyanosis, Ecchymosis, Hemosiderin Staining, Mottled, Pallor, Rubor, Erythema. The periwound has tenderness on palpation. Wound #4 status is Open. Original cause of wound was Surgical Injury. The wound is located on the Left,Plantar Foot. The wound measures 0.2cm length x 2.3cm width x 0.1cm depth; 0.361cm^2 area and 0.036cm^3 volume. There is no tunneling or undermining noted. There is a large amount of serous drainage noted. The wound margin is flat and intact. There is medium (34-66%) pink granulation within the wound bed. There is a medium (34-66%) amount of necrotic tissue within the wound bed including Adherent Slough. The periwound skin appearance exhibited: Callus. The periwound skin appearance did not exhibit: Crepitus, Excoriation, Induration, Rash, Scarring, Dry/Scaly, Maceration, Atrophie Blanche, Cyanosis, Ecchymosis, Hemosiderin Staining, Mottled, Pallor, Rubor, Erythema. The periwound has tenderness  on palpation. Assessment Active Problems ICD-10 L84 - Corns and callosities L97.518 - Non-pressure chronic ulcer of other part of right foot with other specified severity T81.31XA - Disruption of external operation (surgical) wound, not elsewhere classified, initial encounter [name], [name] ([id number]) Plan Discharge From [facility] Services: Wound #1 Right Toe Great: Discharge from Wound Care Center - Find a podiatrist of your choice. May ask your primary care doctor for a referral or see another podiatrist in the same practice as your podiatrist. Please call our office if you have any questions or concerns. Wound #2 Right,Plantar Metatarsal head fifth: Discharge from Wound Care Center - Find a podiatrist of your choice. May ask your primary care doctor for a referral or see another podiatrist in the same practice as your podiatrist. Please call our office if you have any questions or concerns. Wound #3 Right,Plantar Calcaneus: Discharge from Wound Care Center - Find a podiatrist of your choice. May ask your primary care doctor for a referral or see another podiatrist  in the same practice as your podiatrist. Please call our office if you have any questions or concerns. Wound #4 Left,Plantar Foot: Discharge from Wound Care Center - Find a podiatrist of your choice. May ask your primary care doctor for a referral or see another podiatrist in the same practice as your podiatrist. Please call our office if you have any questions or concerns. this 61 year old patient who has recently left her podiatry practice, still has multiple bilateral calluses which need sharp debridement, but it is impossible to examine or treat her as she is extremely sensitive to touch and jerks her legs off every time we even approach her. I have pointed out that she has a suture intact on her right fifth metatarsal head plantar aspect. x-rays done last week of within normal limits and I have explained these results to  her My recommendations she should go back to her podiatrist for further care and if she is not happy with that practice she should find another practice where she can get a second opinion. The treatment of her care is beyond the scope of our wound center and I have explained this to her. Electronic Signature(s) Signed: 11/12/2016 3:16:27 PM By: Evlyn Kanner MD, FACS Entered By: Evlyn Kanner on 11/12/2016 10:40:02 [name] ([id number]) -------------------------------------------------------------------------------- SuperBill Details Patient Name: [name] Date of Service: 11/12/2016 Medical Record Number: [id number] Patient Account Number: [id number] Date of Birth/Sex: May 02, 1955 (60 y.o. Female) Treating RN: Ashok Cordia, Debi Primary Care Provider: Kerby Nora Other Clinician: Referring Provider: Kerby Nora Treating Provider/Extender: Rudene Re in Treatment: 1 Diagnosis Coding ICD-10 Codes Code Description L84 Corns and callosities L97.518 Non-pressure chronic ulcer of other part of right foot with other specified severity Disruption of external operation (surgical) wound, not elsewhere classified, initial T81.31XA encounter Facility Procedures CPT4 Code: 08657846 Description: 99214 - WOUND CARE VISIT-LEV 4 EST PT Modifier: Quantity: 1 Physician Procedures CPT4: Description Modifier Quantity Code [id number] 99213 - WC PHYS LEVEL 3 - EST PT 1 ICD-10 Description Diagnosis L84 Corns and callosities L97.518 Non-pressure chronic ulcer of other part of right foot with other specified severity T81.31XA Disruption of  external operation (surgical) wound, not elsewhere classified, initial encounter Electronic Signature(s) Signed: 11/12/2016 3:16:27 PM By: Evlyn Kanner MD, FACS Signed: 11/12/2016 4:56:27 PM By: Alejandro Mulling Entered By: Alejandro Mulling on 11/12/2016 11:20:06

## 2016-11-14 ENCOUNTER — Ambulatory Visit (INDEPENDENT_AMBULATORY_CARE_PROVIDER_SITE_OTHER): Payer: Medicare HMO | Admitting: Podiatry

## 2016-11-14 ENCOUNTER — Encounter: Payer: Self-pay | Admitting: Podiatry

## 2016-11-14 DIAGNOSIS — M79671 Pain in right foot: Secondary | ICD-10-CM | POA: Diagnosis not present

## 2016-11-14 DIAGNOSIS — M79672 Pain in left foot: Secondary | ICD-10-CM

## 2016-11-14 DIAGNOSIS — Q828 Other specified congenital malformations of skin: Secondary | ICD-10-CM | POA: Diagnosis not present

## 2016-11-14 NOTE — Progress Notes (Signed)
Subjective:    Patient ID: [name], female   DOB: 61 y.o.   MRN: [id number]   HPI patient states she still has lesions on the bottom of the feet and is frustrated because she had surgery several months ago. States she's not noted any drainage recently    ROS      Objective:  Physical Exam neurovascular status intact negative Homans sign was noted with patient found to have hyperkeratotic tissue especially underneath the fourth metatarsal and distal on the right the right heel and on the right big toe. Also has mild lesion formation left with no indications of drainage currently or redness or other indications of other types processes     Assessment:    Chronic lesion formation which is very difficult to get rid of with still early in the healing process with surgery that was done by Dr. Cannon Kettle approximately 8 weeks ago to excise these different lesions     Plan:    H&P conditions reviewed. I am going to send her for very soft type orthotics to try to disperse some of the plantar weight on these areas of weightbearing and I explained that a lot of her problem is hereditary and there is absolutely no long-term guarantees that she will not develop callus tissue again even after surgical excision. She was satisfied with his response and is can to try orthotics and we're also been a place her on a urea cream to try to reduce the callus formation and use Vaseline to try to help with dry skin. She'll be seen back and today I did debride some of the tissue which gave her temporary relief

## 2016-11-14 NOTE — Progress Notes (Signed)
[name], [name] ([id number]) Visit Report for 11/12/2016 Arrival Information Details Patient Name: [name], [name] Date of Service: 11/12/2016 9:45 AM Medical Record Number: [id number] Patient Account Number: [id number] Date of Birth/Sex: 03/27/1955 (61 y.o. Female) Treating RN: Carolyne Fiscal, Debi Primary Care Reedy Biernat: Eliezer Lofts Other Clinician: Referring Pierce Barocio: Eliezer Lofts Treating Tajae Rybicki/Extender: Frann Rider in Treatment: 1 Visit Information History Since Last Visit All ordered tests and consults were completed: No Patient Arrived: Ambulatory Added or deleted any medications: No Arrival Time: 09:54 Any new allergies or adverse reactions: No Accompanied By: self Had a fall or experienced change in No Transfer Assistance: None activities of daily living that may affect Patient Identification Verified: Yes risk of falls: Secondary Verification Process Yes Signs or symptoms of abuse/neglect since last No Completed: visito Patient Requires Transmission- No Hospitalized since last visit: No Based Precautions: Has Dressing in Place as Prescribed: Yes Patient Has Alerts: Yes Pain Present Now: No Patient Alerts: NOT DIABETIC Electronic Signature(s) Signed: 11/12/2016 4:56:27 PM By: Alric Quan Entered By: Alric Quan on 11/12/2016 09:55:38 [name] ([id number]) -------------------------------------------------------------------------------- Clinic Level of Care Assessment Details Patient Name: [name] Date of Service: 11/12/2016 9:45 AM Medical Record Number: [id number] Patient Account Number: [id number] Date of Birth/Sex: 20-Jul-1955 (60 y.o. Female) Treating RN: Carolyne Fiscal, Debi Primary Care Elisah Parmer: Eliezer Lofts Other Clinician: Referring Deauna Yaw: Eliezer Lofts Treating Ladana Chavero/Extender: Frann Rider in Treatment: 1 Clinic Level of Care Assessment Items TOOL 4 Quantity Score X - Use when only an EandM is performed on FOLLOW-UP visit 1  0 ASSESSMENTS - Nursing Assessment / Reassessment X - Reassessment of Co-morbidities (includes updates in patient status) 1 10 X - Reassessment of Adherence to Treatment Plan 1 5 ASSESSMENTS - Wound and Skin Assessment / Reassessment []  - Simple Wound Assessment / Reassessment - one wound 0 X - Complex Wound Assessment / Reassessment - multiple wounds 4 5 []  - Dermatologic / Skin Assessment (not related to wound area) 0 ASSESSMENTS - Focused Assessment []  - Circumferential Edema Measurements - multi extremities 0 []  - Nutritional Assessment / Counseling / Intervention 0 []  - Lower Extremity Assessment (monofilament, tuning fork, pulses) 0 []  - Peripheral Arterial Disease Assessment (using hand held doppler) 0 ASSESSMENTS - Ostomy and/or Continence Assessment and Care []  - Incontinence Assessment and Management 0 []  - Ostomy Care Assessment and Management (repouching, etc.) 0 PROCESS - Coordination of Care []  - Simple Patient / Family Education for ongoing care 0 X - Complex (extensive) Patient / Family Education for ongoing care 1 20 X - Staff obtains Programmer, systems, Records, Test Results / Process Orders 1 10 []  - Staff telephones HHA, Nursing Homes / Clarify orders / etc 0 []  - Routine Transfer to another Facility (non-emergent condition) 0 Linnemann, Leen ([id number]) []  - Routine Hospital Admission (non-emergent condition) 0 []  - New Admissions / Biomedical engineer / Ordering NPWT, Apligraf, etc. 0 []  - Emergency Hospital Admission (emergent condition) 0 X - Simple Discharge Coordination 1 10 []  - Complex (extensive) Discharge Coordination 0 PROCESS - Special Needs []  - Pediatric / Minor Patient Management 0 []  - Isolation Patient Management 0 []  - Hearing / Language / Visual special needs 0 []  - Assessment of Community assistance (transportation, D/C planning, etc.) 0 []  - Additional assistance / Altered mentation 0 []  - Support Surface(s) Assessment (bed, cushion, seat, etc.)  0 INTERVENTIONS - Wound Cleansing / Measurement []  - Simple Wound Cleansing - one wound 0 X - Complex Wound Cleansing - multiple wounds 4 5 X - Wound Imaging (photographs -  any number of wounds) 1 5 []  - Wound Tracing (instead of photographs) 0 []  - Simple Wound Measurement - one wound 0 X - Complex Wound Measurement - multiple wounds 4 5 INTERVENTIONS - Wound Dressings []  - Small Wound Dressing one or multiple wounds 0 []  - Medium Wound Dressing one or multiple wounds 0 []  - Large Wound Dressing one or multiple wounds 0 X - Application of Medications - topical 1 5 []  - Application of Medications - injection 0 INTERVENTIONS - Miscellaneous []  - External ear exam 0 [name], [name] []  - Specimen Collection (cultures, biopsies, blood, body fluids, etc.) 0 []  - Specimen(s) / Culture(s) sent or taken to Lab for analysis 0 []  - Patient Transfer (multiple staff / Harrel Lemon Lift / Similar devices) 0 []  - Simple Staple / Suture removal (25 or less) 0 []  - Complex Staple / Suture removal (26 or more) 0 []  - Hypo / Hyperglycemic Management (close monitor of Blood Glucose) 0 []  - Ankle / Brachial Index (ABI) - do not check if billed separately 0 X - Vital Signs 1 5 Has the patient been seen at the hospital within the last three years: Yes Total Score: 130 Level Of Care: New/Established - Level 4 Electronic Signature(s) Signed: 11/12/2016 4:56:27 PM By: Alric Quan Entered By: Alric Quan on 11/12/2016 11:19:58 [name] ([id number]) -------------------------------------------------------------------------------- Encounter Discharge Information Details Patient Name: [name] Date of Service: 11/12/2016 9:45 AM Medical Record Number: [id number] Patient Account Number: [id number] Date of Birth/Sex: Nov 01, 1955 (60 y.o. Female) Treating RN: Carolyne Fiscal, Debi Primary Care Errol Ala: Eliezer Lofts Other Clinician: Referring Ahria Slappey: Eliezer Lofts Treating Jemal Miskell/Extender:  Frann Rider in Treatment: 1 Encounter Discharge Information Items Discharge Pain Level: 0 Discharge Condition: Stable Ambulatory Status: Ambulatory Discharge Destination: Home Transportation: Private Auto Accompanied By: self Schedule Follow-up Appointment: No Medication Reconciliation completed and provided to Patient/Care No Jami Ohlin: Provided on Clinical Summary of Care: 11/12/2016 Form Type Recipient Paper Patient CG Electronic Signature(s) Signed: 11/13/2016 2:51:10 PM By: Ruthine Dose Entered By: Ruthine Dose on 11/12/2016 10:29:13 [name] ([id number]) -------------------------------------------------------------------------------- Lower Extremity Assessment Details Patient Name: [name] Date of Service: 11/12/2016 9:45 AM Medical Record Number: [id number] Patient Account Number: [id number] Date of Birth/Sex: 1956-01-11 (60 y.o. Female) Treating RN: Carolyne Fiscal, Debi Primary Care Kanai Berrios: Eliezer Lofts Other Clinician: Referring Dena Esperanza: Eliezer Lofts Treating Wilfred Siverson/Extender: Frann Rider in Treatment: 1 Vascular Assessment Pulses: Dorsalis Pedis Palpable: [Left:Yes] [Right:Yes] Posterior Tibial Extremity colors, hair growth, and conditions: Extremity Color: [Left:Normal] [Right:Normal] Hair Growth on Extremity: [Left:Yes] [Right:Yes] Temperature of Extremity: [Left:Warm] [Right:Warm] Capillary Refill: [Left:< 3 seconds] [Right:< 3 seconds] Toe Nail Assessment Left: Right: Thick: No No Discolored: No No Deformed: No No Improper Length and Hygiene: No No Electronic Signature(s) Signed: 11/12/2016 4:56:27 PM By: Alric Quan Entered By: Alric Quan on 11/12/2016 10:04:34 [name] ([id number]) -------------------------------------------------------------------------------- Multi Wound Chart Details Patient Name: [name] Date of Service: 11/12/2016 9:45 AM Medical Record Number: [id number] Patient Account Number:  [id number] Date of Birth/Sex: 05-May-1955 (60 y.o. Female) Treating RN: Carolyne Fiscal, Debi Primary Care Rakayla Ricklefs: Eliezer Lofts Other Clinician: Referring Mercer Stallworth: Eliezer Lofts Treating Richell Corker/Extender: Frann Rider in Treatment: 1 Vital Signs Height(in): 63 Pulse(bpm): 67 Weight(lbs): 149 Blood Pressure 109/51 (mmHg): Body Mass Index(BMI): 26 Temperature(F): 97.7 Respiratory Rate 18 (breaths/min): Photos: [1:No Photos] [2:No Photos] [3:No Photos] Wound Location: [1:Right Toe Great] [2:Right Metatarsal head fifth Right Calcaneus - Plantar - Plantar] Wounding Event: [1:Surgical Injury] [2:Surgical Injury] [3:Surgical Injury] Primary Etiology: [1:Open Surgical Wound] [2:Open Surgical Wound] [3:Open Surgical  Wound] Comorbid History: [1:Chronic Obstructive Pulmonary Disease (COPD), Neuropathy] [2:Chronic Obstructive Pulmonary Disease (COPD), Neuropathy] [3:Chronic Obstructive Pulmonary Disease (COPD), Neuropathy] Date Acquired: [1:09/10/2016] [2:09/10/2016] [3:09/10/2016] Weeks of Treatment: [1:1] [2:1] [3:1] Wound Status: [1:Open] [2:Open] [3:Open] Measurements L x W x D 0.8x0.2x0.1 [2:1x0.2x0.4] [3:0.4x0.3x0.1] (cm) Area (cm) : [1:0.126] [2:0.157] [3:0.094] Volume (cm) : [1:0.013] [2:0.063] [3:0.009] % Reduction in Area: [1:0.00%] [2:0.00%] [3:76.10%] % Reduction in Volume: 0.00% [2:0.00%] [3:76.90%] Classification: [1:Full Thickness Without Exposed Support Structures] [2:Full Thickness Without Exposed Support Structures] [3:Full Thickness Without Exposed Support Structures] Exudate Amount: [1:Medium] [2:Medium] [3:Medium] Exudate Type: [1:Serous] [2:Serous] [3:Serous] Exudate Color: [1:amber] [2:amber] [3:amber] Wound Margin: [1:Flat and Intact] [2:Flat and Intact] [3:Flat and Intact] Granulation Amount: [1:Medium (34-66%)] [2:Medium (34-66%)] [3:Medium (34-66%)] Granulation Quality: [1:Pink] [2:Pink] [3:Pink] Necrotic Amount: [1:Medium (34-66%)] [2:Medium (34-66%)]  [3:Medium (34-66%)] Exposed Structures: [1:Fascia: No Fat Layer (Subcutaneous Tissue) Exposed: No] [2:Fascia: No Fat Layer (Subcutaneous Tissue) Exposed: No] [3:Fascia: No Fat Layer (Subcutaneous Tissue) Exposed: No] Tendon: No Tendon: No Tendon: No Muscle: No Muscle: No Muscle: No Joint: No Joint: No Joint: No Bone: No Bone: No Bone: No Epithelialization: None None None Periwound Skin Texture: Callus: Yes Callus: Yes Callus: Yes Excoriation: No Excoriation: No Excoriation: No Induration: No Induration: No Induration: No Crepitus: No Crepitus: No Crepitus: No Rash: No Rash: No Rash: No Scarring: No Scarring: No Scarring: No Periwound Skin Maceration: No Maceration: No Maceration: No Moisture: Dry/Scaly: No Dry/Scaly: No Dry/Scaly: No Periwound Skin Color: Atrophie Blanche: No Atrophie Blanche: No Atrophie Blanche: No Cyanosis: No Cyanosis: No Cyanosis: No Ecchymosis: No Ecchymosis: No Ecchymosis: No Erythema: No Erythema: No Erythema: No Hemosiderin Staining: No Hemosiderin Staining: No Hemosiderin Staining: No Mottled: No Mottled: No Mottled: No Pallor: No Pallor: No Pallor: No Rubor: No Rubor: No Rubor: No Tenderness on Yes Yes Yes Palpation: Wound Preparation: Ulcer Cleansing: Ulcer Cleansing: Ulcer Cleansing: Rinsed/Irrigated with Rinsed/Irrigated with Rinsed/Irrigated with Saline Saline Saline Topical Anesthetic Topical Anesthetic Topical Anesthetic Applied: Other: lidocaine Applied: Other: lidocaine Applied: Other: lidocaine 4% 4% 4% Wound Number: 4 N/A N/A Photos: No Photos N/A N/A Wound Location: Left Foot - Plantar N/A N/A Wounding Event: Surgical Injury N/A N/A Primary Etiology: Open Surgical Wound N/A N/A Comorbid History: Chronic Obstructive N/A N/A Pulmonary Disease (COPD), Neuropathy Date Acquired: 09/10/2016 N/A N/A Weeks of Treatment: 1 N/A N/A Wound Status: Open N/A N/A Measurements L x W x D 0.2x2.3x0.1 N/A  N/A (cm) Area (cm) : 0.361 N/A N/A Volume (cm) : 0.036 N/A N/A % Reduction in Area: 0.00% N/A N/A % Reduction in Volume: 0.00% N/A N/A Classification: N/A N/A [name], [name] ([id number]) Full Thickness Without Exposed Support Structures Exudate Amount: Large N/A N/A Exudate Type: Serous N/A N/A Exudate Color: amber N/A N/A Wound Margin: Flat and Intact N/A N/A Granulation Amount: Medium (34-66%) N/A N/A Granulation Quality: Pink N/A N/A Necrotic Amount: Medium (34-66%) N/A N/A Exposed Structures: Fascia: No N/A N/A Fat Layer (Subcutaneous Tissue) Exposed: No Tendon: No Muscle: No Joint: No Bone: No Epithelialization: None N/A N/A Periwound Skin Texture: Callus: Yes N/A N/A Excoriation: No Induration: No Crepitus: No Rash: No Scarring: No Periwound Skin Maceration: No N/A N/A Moisture: Dry/Scaly: No Periwound Skin Color: Atrophie Blanche: No N/A N/A Cyanosis: No Ecchymosis: No Erythema: No Hemosiderin Staining: No Mottled: No Pallor: No Rubor: No Tenderness on Yes N/A N/A Palpation: Wound Preparation: Ulcer Cleansing: N/A N/A Rinsed/Irrigated with Saline Topical Anesthetic Applied: Other: lidocaine 4% Treatment Notes Electronic Signature(s) Signed: 11/12/2016 3:16:27 PM By: Christin Fudge MD, FACS Entered By:  Christin Fudge on 11/12/2016 10:30:16 [name], [name] ([id number]) [name], [name] ([id number]) -------------------------------------------------------------------------------- Multi-Disciplinary Care Plan Details Patient Name: [name], [name] Date of Service: 11/12/2016 9:45 AM Medical Record Number: [id number] Patient Account Number: [id number] Date of Birth/Sex: November 10, 1955 (60 y.o. Female) Treating RN: Ahmed Prima Primary Care Amory Simonetti: Eliezer Lofts Other Clinician: Referring Daryn Pisani: Eliezer Lofts Treating Prestyn Stanco/Extender: Frann Rider in Treatment: 1 Active Inactive Electronic Signature(s) Signed: 11/12/2016 4:56:27 PM By: Alric Quan Entered By: Alric Quan on 11/12/2016 11:19:07 [name] ([id number]) -------------------------------------------------------------------------------- Pain Assessment Details Patient Name: [name] Date of Service: 11/12/2016 9:45 AM Medical Record Number: [id number] Patient Account Number: [id number] Date of Birth/Sex: 08-22-1955 (60 y.o. Female) Treating RN: Carolyne Fiscal, Debi Primary Care Skyelyn Scruggs: Eliezer Lofts Other Clinician: Referring Blonnie Maske: Eliezer Lofts Treating Granvel Proudfoot/Extender: Frann Rider in Treatment: 1 Active Problems Location of Pain Severity and Description of Pain Patient Has Paino No Site Locations With Dressing Change: No Pain Management and Medication Current Pain Management: Electronic Signature(s) Signed: 11/12/2016 4:56:27 PM By: Alric Quan Entered By: Alric Quan on 11/12/2016 09:55:46 [name] ([id number]) -------------------------------------------------------------------------------- Patient/Caregiver Education Details Patient Name: [name] Date of Service: 11/12/2016 9:45 AM Medical Record Number: [id number] Patient Account Number: [id number] Date of Birth/Gender: 1955-12-13 (60 y.o. Female) Treating RN: Ahmed Prima Primary Care Physician: Eliezer Lofts Other Clinician: Referring Physician: Eliezer Lofts Treating Physician/Extender: Frann Rider in Treatment: 1 Education Assessment Education Provided To: Patient Education Topics Provided Wound/Skin Impairment: Other: Please find a podiatrist of your choice.Please call our office if you have any questions or Handouts: concern Methods: Explain/Verbal Responses: State content correctly Electronic Signature(s) Signed: 11/12/2016 4:56:27 PM By: Alric Quan Entered By: Alric Quan on 11/12/2016 10:27:23 [name] ([id number]) -------------------------------------------------------------------------------- Wound Assessment  Details Patient Name: [name] Date of Service: 11/12/2016 9:45 AM Medical Record Number: [id number] Patient Account Number: [id number] Date of Birth/Sex: April 30, 1955 (60 y.o. Female) Treating RN: Carolyne Fiscal, Debi Primary Care Shakya Sebring: Eliezer Lofts Other Clinician: Referring Lajoya Dombek: Eliezer Lofts Treating Lynore Coscia/Extender: Frann Rider in Treatment: 1 Wound Status Wound Number: 1 Primary Open Surgical Wound Etiology: Wound Location: Right Toe Great Wound Open Wounding Event: Surgical Injury Status: Date Acquired: 09/10/2016 Comorbid Chronic Obstructive Pulmonary Weeks Of Treatment: 1 History: Disease (COPD), Neuropathy Clustered Wound: No Photos Photo Uploaded By: Alric Quan on 11/12/2016 16:19:19 Wound Measurements Length: (cm) 0.8 Width: (cm) 0.2 Depth: (cm) 0.1 Area: (cm) 0.126 Volume: (cm) 0.013 % Reduction in Area: 0% % Reduction in Volume: 0% Epithelialization: None Tunneling: No Undermining: No Wound Description Full Thickness Without Exposed Classification: Support Structures Wound Margin: Flat and Intact Exudate Medium Amount: Exudate Type: Serous Exudate Color: amber Foul Odor After Cleansing: No Slough/Fibrino No Wound Bed Granulation Amount: Medium (34-66%) Exposed Structure Granulation Quality: Pink Fascia Exposed: No Necrotic Amount: Medium (34-66%) Fat Layer (Subcutaneous Tissue) Exposed: No Hairston, Floris ([id number]) Necrotic Quality: Adherent Slough Tendon Exposed: No Muscle Exposed: No Joint Exposed: No Bone Exposed: No Periwound Skin Texture Texture Color No Abnormalities Noted: No No Abnormalities Noted: No Callus: Yes Atrophie Blanche: No Crepitus: No Cyanosis: No Excoriation: No Ecchymosis: No Induration: No Erythema: No Rash: No Hemosiderin Staining: No Scarring: No Mottled: No Pallor: No Moisture Rubor: No No Abnormalities Noted: No Dry / Scaly: No Temperature / Pain Maceration: No Tenderness on  Palpation: Yes Wound Preparation Ulcer Cleansing: Rinsed/Irrigated with Saline Topical Anesthetic Applied: Other: lidocaine 4%, Electronic Signature(s) Signed: 11/12/2016 4:56:27 PM By: Alric Quan Entered By: Alric Quan on 11/12/2016 10:03:12 [name] ([id number]) -------------------------------------------------------------------------------- Wound Assessment Details Patient Name: [name],  [name] Date of Service: 11/12/2016 9:45 AM Medical Record Number: [id number] Patient Account Number: [id number] Date of Birth/Sex: [birth year] (60 y.o. Female) Treating RN: Carolyne Fiscal, Debi Primary Care Mackenzye Mackel: Eliezer Lofts Other Clinician: Referring Jace Dowe: Eliezer Lofts Treating Marvis Saefong/Extender: Frann Rider in Treatment: 1 Wound Status Wound Number: 2 Primary Open Surgical Wound Etiology: Wound Location: Right Metatarsal head fifth - Plantar Wound Open Status: Wounding Event: Surgical Injury Comorbid Chronic Obstructive Pulmonary Date Acquired: 09/10/2016 History: Disease (COPD), Neuropathy Weeks Of Treatment: 1 Clustered Wound: No Photos Photo Uploaded By: Alric Quan on 11/12/2016 16:19:20 Wound Measurements Length: (cm) 1 Width: (cm) 0.2 Depth: (cm) 0.4 Area: (cm) 0.157 Volume: (cm) 0.063 % Reduction in Area: 0% % Reduction in Volume: 0% Epithelialization: None Tunneling: No Undermining: No Wound Description Full Thickness Without Exposed Classification: Support Structures Wound Margin: Flat and Intact Exudate Medium Amount: Exudate Type: Serous Exudate Color: amber Foul Odor After Cleansing: No Slough/Fibrino Yes Wound Bed Granulation Amount: Medium (34-66%) Exposed Structure Granulation Quality: Pink Fascia Exposed: No Hillman, Tonee ([id number]) Necrotic Amount: Medium (34-66%) Fat Layer (Subcutaneous Tissue) Exposed: No Necrotic Quality: Adherent Slough Tendon Exposed: No Muscle Exposed: No Joint Exposed: No Bone Exposed:  No Periwound Skin Texture Texture Color No Abnormalities Noted: No No Abnormalities Noted: No Callus: Yes Atrophie Blanche: No Crepitus: No Cyanosis: No Excoriation: No Ecchymosis: No Induration: No Erythema: No Rash: No Hemosiderin Staining: No Scarring: No Mottled: No Pallor: No Moisture Rubor: No No Abnormalities Noted: No Dry / Scaly: No Temperature / Pain Maceration: No Tenderness on Palpation: Yes Wound Preparation Ulcer Cleansing: Rinsed/Irrigated with Saline Topical Anesthetic Applied: Other: lidocaine 4%, Electronic Signature(s) Signed: 11/12/2016 4:56:27 PM By: Alric Quan Entered By: Alric Quan on 11/12/2016 10:03:24 [name] ([id number]) -------------------------------------------------------------------------------- Wound Assessment Details Patient Name: [name] Date of Service: 11/12/2016 9:45 AM Medical Record Number: [id number] Patient Account Number: [id number] Date of Birth/Sex: [birth year] (60 y.o. Female) Treating RN: Carolyne Fiscal, Debi Primary Care Merrily Tegeler: Eliezer Lofts Other Clinician: Referring Tinzlee Craker: Eliezer Lofts Treating Milind Raether/Extender: Frann Rider in Treatment: 1 Wound Status Wound Number: 3 Primary Open Surgical Wound Etiology: Wound Location: Right Calcaneus - Plantar Wound Open Wounding Event: Surgical Injury Status: Date Acquired: 09/10/2016 Comorbid Chronic Obstructive Pulmonary Weeks Of Treatment: 1 History: Disease (COPD), Neuropathy Clustered Wound: No Photos Photo Uploaded By: Alric Quan on 11/12/2016 16:19:51 Wound Measurements Length: (cm) 0.4 Width: (cm) 0.3 Depth: (cm) 0.1 Area: (cm) 0.094 Volume: (cm) 0.009 % Reduction in Area: 76.1% % Reduction in Volume: 76.9% Epithelialization: None Tunneling: No Undermining: No Wound Description Full Thickness Without Exposed Classification: Support Structures Wound Margin: Flat and Intact Exudate Medium Amount: Exudate Type:  Serous Exudate Color: amber Foul Odor After Cleansing: No Slough/Fibrino Yes Wound Bed Granulation Amount: Medium (34-66%) Exposed Structure Granulation Quality: Pink Fascia Exposed: No Necrotic Amount: Medium (34-66%) Fat Layer (Subcutaneous Tissue) Exposed: No Stigler, Icie ([id number]) Necrotic Quality: Adherent Slough Tendon Exposed: No Muscle Exposed: No Joint Exposed: No Bone Exposed: No Periwound Skin Texture Texture Color No Abnormalities Noted: No No Abnormalities Noted: No Callus: Yes Atrophie Blanche: No Crepitus: No Cyanosis: No Excoriation: No Ecchymosis: No Induration: No Erythema: No Rash: No Hemosiderin Staining: No Scarring: No Mottled: No Pallor: No Moisture Rubor: No No Abnormalities Noted: No Dry / Scaly: No Temperature / Pain Maceration: No Tenderness on Palpation: Yes Wound Preparation Ulcer Cleansing: Rinsed/Irrigated with Saline Topical Anesthetic Applied: Other: lidocaine 4%, Electronic Signature(s) Signed: 11/12/2016 4:56:27 PM By: Alric Quan Entered By: Alric Quan on 11/12/2016 10:03:37 [name] ([id number]) --------------------------------------------------------------------------------  Wound Assessment Details Patient Name: [name], [name] Date of Service: 11/12/2016 9:45 AM Medical Record Number: [id number] Patient Account Number: [id number] Date of Birth/Sex: 03/12/1956 (61 y.o. Female) Treating RN: Carolyne Fiscal, Debi Primary Care Mayley Lish: Eliezer Lofts Other Clinician: Referring Irmalee Riemenschneider: Eliezer Lofts Treating Tylin Stradley/Extender: Frann Rider in Treatment: 1 Wound Status Wound Number: 4 Primary Open Surgical Wound Etiology: Wound Location: Left Foot - Plantar Wound Open Wounding Event: Surgical Injury Status: Date Acquired: 09/10/2016 Comorbid Chronic Obstructive Pulmonary Weeks Of Treatment: 1 History: Disease (COPD), Neuropathy Clustered Wound: No Photos Photo Uploaded By: Alric Quan on 11/12/2016  16:19:52 Wound Measurements Length: (cm) 0.2 Width: (cm) 2.3 Depth: (cm) 0.1 Area: (cm) 0.361 Volume: (cm) 0.036 % Reduction in Area: 0% % Reduction in Volume: 0% Epithelialization: None Tunneling: No Undermining: No Wound Description Full Thickness Without Exposed Classification: Support Structures Wound Margin: Flat and Intact Exudate Large Amount: Exudate Type: Serous Exudate Color: amber Foul Odor After Cleansing: No Slough/Fibrino Yes Wound Bed Granulation Amount: Medium (34-66%) Exposed Structure Granulation Quality: Pink Fascia Exposed: No Necrotic Amount: Medium (34-66%) Fat Layer (Subcutaneous Tissue) Exposed: No [name], [name] ([id number]) Necrotic Quality: Adherent Slough Tendon Exposed: No Muscle Exposed: No Joint Exposed: No Bone Exposed: No Periwound Skin Texture Texture Color No Abnormalities Noted: No No Abnormalities Noted: No Callus: Yes Atrophie Blanche: No Crepitus: No Cyanosis: No Excoriation: No Ecchymosis: No Induration: No Erythema: No Rash: No Hemosiderin Staining: No Scarring: No Mottled: No Pallor: No Moisture Rubor: No No Abnormalities Noted: No Dry / Scaly: No Temperature / Pain Maceration: No Tenderness on Palpation: Yes Wound Preparation Ulcer Cleansing: Rinsed/Irrigated with Saline Topical Anesthetic Applied: Other: lidocaine 4%, Electronic Signature(s) Signed: 11/12/2016 4:56:27 PM By: Alric Quan Entered By: Alric Quan on 11/12/2016 10:03:49 [name] ([id number]) -------------------------------------------------------------------------------- Vitals Details Patient Name: [name] Date of Service: 11/12/2016 9:45 AM Medical Record Number: [id number] Patient Account Number: [id number] Date of Birth/Sex: 1955-08-13 (60 y.o. Female) Treating RN: Carolyne Fiscal, Debi Primary Care Zan Orlick: Eliezer Lofts Other Clinician: Referring Kroy Sprung: Eliezer Lofts Treating Anarie Kalish/Extender: Frann Rider in  Treatment: 1 Vital Signs Time Taken: 09:55 Temperature (F): 97.7 Height (in): 63 Pulse (bpm): 67 Weight (lbs): 149 Respiratory Rate (breaths/min): 18 Body Mass Index (BMI): 26.4 Blood Pressure (mmHg): 109/51 Reference Range: 80 - 120 mg / dl Electronic Signature(s) Signed: 11/12/2016 4:56:27 PM By: Alric Quan Entered By: Alric Quan on 11/12/2016 09:57:40

## 2016-11-16 DIAGNOSIS — H2513 Age-related nuclear cataract, bilateral: Secondary | ICD-10-CM | POA: Diagnosis not present

## 2016-11-19 ENCOUNTER — Ambulatory Visit: Payer: Medicare HMO | Admitting: Orthotics

## 2016-11-19 DIAGNOSIS — T8130XA Disruption of wound, unspecified, initial encounter: Secondary | ICD-10-CM

## 2016-11-19 NOTE — Progress Notes (Signed)
Patient was seen today to be scanned for F/O.  Patient has hx of lesions on plantar surface of foot.  Offloads were indicated and patient was scanned for F/O to be made at [facility].  Super soft w/ p-cell topcover.

## 2016-11-21 DIAGNOSIS — F310 Bipolar disorder, current episode hypomanic: Secondary | ICD-10-CM | POA: Diagnosis not present

## 2016-11-21 DIAGNOSIS — R69 Illness, unspecified: Secondary | ICD-10-CM | POA: Diagnosis not present

## 2016-11-22 DIAGNOSIS — R69 Illness, unspecified: Secondary | ICD-10-CM | POA: Diagnosis not present

## 2016-11-27 ENCOUNTER — Ambulatory Visit: Payer: Medicare HMO | Admitting: Sports Medicine

## 2016-11-29 DIAGNOSIS — R69 Illness, unspecified: Secondary | ICD-10-CM | POA: Diagnosis not present

## 2016-11-29 DIAGNOSIS — F310 Bipolar disorder, current episode hypomanic: Secondary | ICD-10-CM | POA: Diagnosis not present

## 2016-12-07 ENCOUNTER — Ambulatory Visit: Payer: Medicare HMO | Admitting: Family Medicine

## 2016-12-10 ENCOUNTER — Ambulatory Visit (INDEPENDENT_AMBULATORY_CARE_PROVIDER_SITE_OTHER): Payer: Medicare HMO | Admitting: Orthotics

## 2016-12-10 DIAGNOSIS — M79672 Pain in left foot: Secondary | ICD-10-CM

## 2016-12-10 DIAGNOSIS — Q828 Other specified congenital malformations of skin: Secondary | ICD-10-CM

## 2016-12-10 DIAGNOSIS — M79671 Pain in right foot: Secondary | ICD-10-CM | POA: Diagnosis not present

## 2016-12-11 NOTE — Progress Notes (Signed)
Patient came in today to pick up custom made foot orthotics.  The goals were accomplished and the patient reported no dissatisfaction with said orthotics.  Patient was advised of breakin period and how to report any issues.    Patient was advised through phone call that charges would be $300. Self pay.Marland KitchenMarland KitchenMarland KitchenDidn't sign ABN.

## 2016-12-12 ENCOUNTER — Ambulatory Visit (INDEPENDENT_AMBULATORY_CARE_PROVIDER_SITE_OTHER): Payer: Medicare HMO | Admitting: Family Medicine

## 2016-12-12 ENCOUNTER — Encounter: Payer: Self-pay | Admitting: Family Medicine

## 2016-12-12 VITALS — BP 101/61 | HR 67 | Temp 97.4°F | Ht 62.5 in | Wt 152.5 lb

## 2016-12-12 DIAGNOSIS — K7460 Unspecified cirrhosis of liver: Secondary | ICD-10-CM | POA: Diagnosis not present

## 2016-12-12 DIAGNOSIS — R69 Illness, unspecified: Secondary | ICD-10-CM | POA: Diagnosis not present

## 2016-12-12 DIAGNOSIS — Z13820 Encounter for screening for osteoporosis: Secondary | ICD-10-CM | POA: Diagnosis not present

## 2016-12-12 DIAGNOSIS — Z79899 Other long term (current) drug therapy: Secondary | ICD-10-CM | POA: Diagnosis not present

## 2016-12-12 LAB — BASIC METABOLIC PANEL
BUN: 13 mg/dL (ref 6–23)
CHLORIDE: 99 meq/L (ref 96–112)
CO2: 32 mEq/L (ref 19–32)
Calcium: 9.4 mg/dL (ref 8.4–10.5)
Creatinine, Ser: 0.99 mg/dL (ref 0.40–1.20)
GFR: 60.44 mL/min (ref >60.00)
Glucose, Bld: 77 mg/dL (ref 70–99)
POTASSIUM: 4.3 meq/L (ref 3.5–5.1)
SODIUM: 135 meq/L (ref 135–145)

## 2016-12-12 LAB — POC URINALSYSI DIPSTICK (AUTOMATED)
BILIRUBIN UA: NEGATIVE
Blood, UA: NEGATIVE
GLUCOSE UA: NEGATIVE
Ketones, UA: NEGATIVE
LEUKOCYTES UA: NEGATIVE
NITRITE UA: NEGATIVE
PH UA: 6 (ref 5.0–8.0)
Protein, UA: NEGATIVE
Spec Grav, UA: 1.02 (ref 1.010–1.025)
Urobilinogen, UA: 1 E.U./dL

## 2016-12-12 LAB — CBC WITH DIFFERENTIAL/PLATELET
BASOS PCT: 0.7 % (ref 0.0–3.0)
Basophils Absolute: 0 10*3/uL (ref 0.0–0.1)
Eosinophils Absolute: 0.2 10*3/uL (ref 0.0–0.7)
Eosinophils Relative: 2.2 % (ref 0.0–5.0)
HCT: 45.6 % (ref 36.0–46.0)
HEMOGLOBIN: 15.4 g/dL — AB (ref 12.0–15.0)
LYMPHS PCT: 15.7 % (ref 12.0–46.0)
Lymphs Abs: 1.1 10*3/uL (ref 0.7–4.0)
MCHC: 33.7 g/dL (ref 30.0–36.0)
MCV: 95.8 fl (ref 78.0–100.0)
MONOS PCT: 17.1 % — AB (ref 3.0–12.0)
Monocytes Absolute: 1.2 10*3/uL — ABNORMAL HIGH (ref 0.1–1.0)
NEUTROS ABS: 4.6 10*3/uL (ref 1.4–7.7)
Neutrophils Relative %: 64.3 % (ref 43.0–77.0)
Platelets: 98 10*3/uL — ABNORMAL LOW (ref 150.0–400.0)
RBC: 4.77 Mil/uL (ref 3.87–5.11)
RDW: 13.4 % (ref 11.5–15.5)
WBC: 7.2 10*3/uL (ref 4.0–10.5)

## 2016-12-12 LAB — TSH: TSH: 0.06 u[IU]/mL — AB (ref 0.35–4.50)

## 2016-12-12 LAB — HEPATIC FUNCTION PANEL
ALK PHOS: 97 U/L (ref 39–117)
ALT: 19 U/L (ref 0–35)
AST: 28 U/L (ref 0–37)
Albumin: 4 g/dL (ref 3.5–5.2)
BILIRUBIN DIRECT: 0.3 mg/dL (ref 0.0–0.3)
BILIRUBIN TOTAL: 1.4 mg/dL — AB (ref 0.2–1.2)
Total Protein: 7.7 g/dL (ref 6.0–8.3)

## 2016-12-12 LAB — LIPID PANEL
CHOLESTEROL: 150 mg/dL (ref 0–200)
HDL: 52 mg/dL (ref >39.00)
LDL Cholesterol: 82 mg/dL (ref 0–99)
NonHDL: 98.34
Total CHOL/HDL Ratio: 3
Triglycerides: 82 mg/dL (ref 0.0–149.0)
VLDL: 16.4 mg/dL (ref 0.0–40.0)

## 2016-12-12 LAB — HEMOGLOBIN A1C: Hgb A1c MFr Bld: 5.1 % (ref 4.6–6.5)

## 2016-12-12 LAB — PROTIME-INR
INR: 1.1 ratio — ABNORMAL HIGH (ref 0.8–1.0)
Prothrombin Time: 11.9 s (ref 9.6–13.1)

## 2016-12-12 LAB — VITAMIN D 25 HYDROXY (VIT D DEFICIENCY, FRACTURES): VITD: 40.65 ng/mL (ref 30.00–100.00)

## 2016-12-12 NOTE — Assessment & Plan Note (Signed)
Pt stable labs and test ordered as requested by specialists.

## 2016-12-12 NOTE — Progress Notes (Signed)
   Subjective:    Patient ID: [name], female    DOB: [birth year], 61 y.o.   MRN: [id number]  HPI    61 year old female pt presents with request for lab work form her [facility] hepatitis MD for hepatitis C as well as from Dr. Luana Shu at [facility] , psychiatrist .  Labs ordered and UA performed.  Need done for bipolar disorder meds an   Blood pressure 101/61, pulse 67, temperature (!) 97.4 F (36.3 C), temperature source Oral, height 5' 2.5" (1.588 m), weight 152 lb 8 oz (69.2 kg).   Review of Systems  Constitutional: Negative for fatigue and fever.  HENT: Negative for congestion.   Eyes: Negative for pain.  Respiratory: Negative for cough and shortness of breath.   Cardiovascular: Negative for chest pain, palpitations and leg swelling.  Gastrointestinal: Negative for abdominal pain.  Genitourinary: Negative for dysuria.  Musculoskeletal: Negative for back pain.  Psychiatric/Behavioral: Negative for dysphoric mood.       Objective:   Physical Exam  Constitutional: Vital signs are normal. She appears well-developed and well-nourished. She is cooperative.  Non-toxic appearance. She does not appear ill. No distress.  HENT:  Right Ear: Hearing, tympanic membrane and ear canal normal. Tympanic membrane is not erythematous, not retracted and not bulging.  Left Ear: Hearing, tympanic membrane and ear canal normal. Tympanic membrane is not erythematous, not retracted and not bulging.  Nose: No mucosal edema or rhinorrhea. Right sinus exhibits no maxillary sinus tenderness and no frontal sinus tenderness. Left sinus exhibits no maxillary sinus tenderness and no frontal sinus tenderness.  Mouth/Throat: Uvula is midline and mucous membranes are normal.  Eyes: Lids are normal. Lids are everted and swept, no foreign bodies found.  Neck: Trachea normal and normal range of motion. Neck supple. Carotid bruit is not present. No thyroid mass and no thyromegaly present.  Cardiovascular: Normal  rate, regular rhythm, S1 normal, S2 normal, normal heart sounds, intact distal pulses and normal pulses.  Exam reveals no gallop and no friction rub.   No murmur heard. Pulmonary/Chest: Effort normal and breath sounds normal. No tachypnea. No respiratory distress. She has no decreased breath sounds. She has no wheezes. She has no rhonchi. She has no rales.  Abdominal: Soft. Normal appearance and bowel sounds are normal. There is no tenderness.  Neurological: She is alert.  Skin: Skin is warm, dry and intact. No rash noted.  Psychiatric: Her speech is normal and behavior is normal. Judgment and thought content normal. Her mood appears not anxious. Cognition and memory are normal. She does not exhibit a depressed mood.          Assessment & Plan:

## 2016-12-13 ENCOUNTER — Other Ambulatory Visit: Payer: Medicare HMO

## 2016-12-13 ENCOUNTER — Other Ambulatory Visit (INDEPENDENT_AMBULATORY_CARE_PROVIDER_SITE_OTHER): Payer: Medicare HMO

## 2016-12-13 DIAGNOSIS — R946 Abnormal results of thyroid function studies: Secondary | ICD-10-CM | POA: Diagnosis not present

## 2016-12-13 LAB — T3, FREE: T3, Free: 3.2 pg/mL (ref 2.3–4.2)

## 2016-12-13 LAB — AFP TUMOR MARKER: AFP TUMOR MARKER: 17.7 ng/mL — AB

## 2016-12-13 LAB — T4, FREE: Free T4: 1.57 ng/dL (ref 0.60–1.60)

## 2016-12-20 ENCOUNTER — Telehealth: Payer: Self-pay | Admitting: Family Medicine

## 2016-12-20 DIAGNOSIS — R69 Illness, unspecified: Secondary | ICD-10-CM | POA: Diagnosis not present

## 2016-12-20 NOTE — Telephone Encounter (Signed)
Patient said she wants to come by and pick up her lab results.  Please leave results at the front desk.

## 2016-12-20 NOTE — Telephone Encounter (Addendum)
Left message for [name] that her labs were stable.  I did fax her results to [name] @ [name] at [phone] and [name] at [name] at [phone].  I ask [name] to call me back with her fax number if she wants a copy faxed to her per message.  No fax number was given.

## 2016-12-20 NOTE — Telephone Encounter (Signed)
Patient called to get her lab results.  Patient would like a copy faxed to her.

## 2016-12-20 NOTE — Telephone Encounter (Signed)
Copy of lab results placed up front for Shaunessy to pick up.

## 2016-12-25 DIAGNOSIS — F310 Bipolar disorder, current episode hypomanic: Secondary | ICD-10-CM | POA: Diagnosis not present

## 2016-12-25 DIAGNOSIS — R69 Illness, unspecified: Secondary | ICD-10-CM | POA: Diagnosis not present

## 2016-12-26 ENCOUNTER — Encounter: Payer: Self-pay | Admitting: *Deleted

## 2016-12-26 DIAGNOSIS — R69 Illness, unspecified: Secondary | ICD-10-CM | POA: Diagnosis not present

## 2016-12-27 ENCOUNTER — Ambulatory Visit (INDEPENDENT_AMBULATORY_CARE_PROVIDER_SITE_OTHER): Payer: Medicare HMO | Admitting: Family Medicine

## 2016-12-27 ENCOUNTER — Encounter: Payer: Self-pay | Admitting: Family Medicine

## 2016-12-27 VITALS — BP 100/62 | HR 70 | Temp 97.4°F | Ht 62.5 in | Wt 151.5 lb

## 2016-12-27 DIAGNOSIS — Z113 Encounter for screening for infections with a predominantly sexual mode of transmission: Secondary | ICD-10-CM

## 2016-12-27 DIAGNOSIS — R42 Dizziness and giddiness: Secondary | ICD-10-CM

## 2016-12-27 DIAGNOSIS — R69 Illness, unspecified: Secondary | ICD-10-CM | POA: Diagnosis not present

## 2016-12-27 NOTE — Progress Notes (Signed)
   Subjective:    Patient ID: [name], female    DOB: March 18, 1956, 61 y.o.   MRN: [id number]  HPI 61 year old female presents with desire for STD testing  As well as some dizziness and nausea.  She reports her partner has been unfaithful.. No specific STD concern.. Just wants to be screened.   IN last 3 days she has had some dizziness, no room spinning.. Just off balance.. Associated with nausea.  Not triggered by movement.  not lightheaded or presyncopal. No congestion, no allergies.  No med.  No fever. No abd pain.  no SOb, no  Cp  no fatigue  Review of Systems  Constitutional: Negative for fatigue and fever.  HENT: Negative for ear pain.   Eyes: Negative for pain.  Respiratory: Negative for chest tightness and shortness of breath.   Cardiovascular: Negative for chest pain, palpitations and leg swelling.  Gastrointestinal: Positive for nausea. Negative for abdominal pain.  Genitourinary: Negative for dysuria.  Neurological: Positive for dizziness.       Objective:   Physical Exam  Constitutional: Vital signs are normal. She appears well-developed and well-nourished. She is cooperative.  Non-toxic appearance. She does not appear ill. No distress.  HENT:  Head: Normocephalic.  Right Ear: Hearing, tympanic membrane, external ear and ear canal normal. Tympanic membrane is not erythematous, not retracted and not bulging.  Left Ear: Hearing, tympanic membrane, external ear and ear canal normal. Tympanic membrane is not erythematous, not retracted and not bulging.  Nose: No mucosal edema or rhinorrhea. Right sinus exhibits no maxillary sinus tenderness and no frontal sinus tenderness. Left sinus exhibits no maxillary sinus tenderness and no frontal sinus tenderness.  Mouth/Throat: Uvula is midline, oropharynx is clear and moist and mucous membranes are normal.  Eyes: Pupils are equal, round, and reactive to light. Conjunctivae, EOM and lids are normal. Lids are everted and swept,  no foreign bodies found.  Neck: Trachea normal and normal range of motion. Neck supple. Carotid bruit is not present. No thyroid mass and no thyromegaly present.  Cardiovascular: Normal rate, regular rhythm, S1 normal, S2 normal, normal heart sounds, intact distal pulses and normal pulses.  Exam reveals no gallop and no friction rub.   No murmur heard. Pulmonary/Chest: Effort normal and breath sounds normal. No tachypnea. No respiratory distress. She has no decreased breath sounds. She has no wheezes. She has no rhonchi. She has no rales.  Abdominal: Soft. Normal appearance and bowel sounds are normal. There is no tenderness.  Neurological: She is alert.  Skin: Skin is warm, dry and intact. No rash noted.  Psychiatric: Her speech is normal and behavior is normal. Judgment and thought content normal. Her mood appears not anxious. Cognition and memory are normal. She does not exhibit a depressed mood.          Assessment & Plan:

## 2016-12-27 NOTE — Addendum Note (Signed)
Addended by: Mady Haagensen on: 12/27/2016 05:19 PM   Modules accepted: Orders

## 2016-12-27 NOTE — Patient Instructions (Signed)
Please stop at the lab to have labs drawn.  

## 2016-12-27 NOTE — Assessment & Plan Note (Signed)
Sent for urine and blood testing.  Pt asymptomatic.

## 2016-12-27 NOTE — Addendum Note (Signed)
Addended by: Mady Haagensen on: 12/27/2016 11:18 AM   Modules accepted: Orders

## 2016-12-27 NOTE — Assessment & Plan Note (Signed)
Not clearly vertigo, but milder. Possible viral labyrinthitis.  Recent extensive labs normal.  Call if not improving with time.

## 2016-12-28 ENCOUNTER — Other Ambulatory Visit: Payer: Medicare HMO

## 2016-12-28 LAB — C. TRACHOMATIS/N. GONORRHOEAE RNA
C. trachomatis RNA, TMA: NOT DETECTED
N. gonorrhoeae RNA, TMA: NOT DETECTED

## 2016-12-31 ENCOUNTER — Other Ambulatory Visit (INDEPENDENT_AMBULATORY_CARE_PROVIDER_SITE_OTHER): Payer: Medicare HMO

## 2016-12-31 DIAGNOSIS — R69 Illness, unspecified: Secondary | ICD-10-CM | POA: Diagnosis not present

## 2016-12-31 DIAGNOSIS — Z202 Contact with and (suspected) exposure to infections with a predominantly sexual mode of transmission: Secondary | ICD-10-CM

## 2017-01-01 ENCOUNTER — Telehealth: Payer: Self-pay | Admitting: *Deleted

## 2017-01-01 NOTE — Telephone Encounter (Signed)
Meredith notified as instructed by telephone.

## 2017-01-01 NOTE — Telephone Encounter (Signed)
Send her the  recent free t3 and free t4.. Those are normal.. Let her know there is no issue with thyroid function.. Will follow to see if it develops.

## 2017-01-01 NOTE — Telephone Encounter (Signed)
Gala Murdoch NP called stating that she sees patient thru [facility] the [place]. Ailene Ravel stated that she got a copy of the lab work and she noticed that her thyroid level is off. Ailene Ravel stated that she sees patient for bipolar disorder and patient is manic, but wondering if her thyroid being off could affect her being manic also? Ailene Ravel wanted to know what Dr. Diona Browner thinks about her thyroid level?

## 2017-01-03 LAB — HSV(HERPES SIMPLEX VRS) I + II AB-IGG
HAV 1 IGG,TYPE SPECIFIC AB: 9.36 index — ABNORMAL HIGH
HSV 2 IGG, TYPE SPEC: 13.5 {index} — AB

## 2017-01-03 LAB — RPR: RPR Ser Ql: NONREACTIVE

## 2017-01-03 LAB — HIV ANTIBODY (ROUTINE TESTING W REFLEX): HIV 1&2 Ab, 4th Generation: NONREACTIVE

## 2017-01-03 LAB — HSV 1/2 AB (IGM), IFA W/RFLX TITER
HSV 1 IgM Screen: NEGATIVE
HSV 2 IgM Screen: NEGATIVE

## 2017-01-08 DIAGNOSIS — R69 Illness, unspecified: Secondary | ICD-10-CM | POA: Diagnosis not present

## 2017-01-08 DIAGNOSIS — F310 Bipolar disorder, current episode hypomanic: Secondary | ICD-10-CM | POA: Diagnosis not present

## 2017-01-16 ENCOUNTER — Ambulatory Visit: Payer: Self-pay | Admitting: *Deleted

## 2017-01-16 NOTE — Telephone Encounter (Signed)
  Reason for Disposition . [1] MILD dizziness (e.g., walking normally) AND [2] has been evaluated by physician for this  Answer Assessment - Initial Assessment Questions 1. DESCRIPTION: "Describe your dizziness."     Not dizzy 2. LIGHTHEADED: "Do you feel lightheaded?" (e.g., somewhat faint, woozy, weak upon standing)     No lightheaded 3. VERTIGO: "Do you feel like either you or the room is spinning or tilting?" (i.e. vertigo)     no 4. SEVERITY: "How bad is it?"  "Do you feel like you are going to faint?" "Can you stand and walk?"   - MILD - walking normally   - MODERATE - interferes with normal activities (e.g., work, school)    - SEVERE - unable to stand, requires support to walk, feels like passing out now.      moderate 5. ONSET:  "When did the dizziness begin?"     Nn/a 6. AGGRAVATING FACTORS: "Does anything make it worse?" (e.g., standing, change in head position)     n/a 7. HEART RATE: "Can you tell me your heart rate?" "How many beats in 15 seconds?"  (Note: not all patients can do this)       n/a 8. CAUSE: "What do you think is causing the dizziness?"     n/a 9. RECURRENT SYMPTOM: "Have you had dizziness before?" If so, ask: "When was the last time?" "What happened that time?"     Had the staggering this morning and happens every morning or night 10. OTHER SYMPTOMS: "Do you have any other symptoms?" (e.g., fever, chest pain, vomiting, diarrhea, bleeding)       no 11. PREGNANCY: "Is there any chance you are pregnant?" "When was your last menstrual period?"       n/a  Protocols used: DIZZINESS [facility]

## 2017-01-17 DIAGNOSIS — R69 Illness, unspecified: Secondary | ICD-10-CM | POA: Diagnosis not present

## 2017-01-22 DIAGNOSIS — R69 Illness, unspecified: Secondary | ICD-10-CM | POA: Diagnosis not present

## 2017-01-24 ENCOUNTER — Telehealth: Payer: Self-pay

## 2017-01-24 ENCOUNTER — Ambulatory Visit (INDEPENDENT_AMBULATORY_CARE_PROVIDER_SITE_OTHER): Payer: Medicare HMO | Admitting: Family Medicine

## 2017-01-24 ENCOUNTER — Encounter: Payer: Self-pay | Admitting: Family Medicine

## 2017-01-24 DIAGNOSIS — R208 Other disturbances of skin sensation: Secondary | ICD-10-CM

## 2017-01-24 DIAGNOSIS — R200 Anesthesia of skin: Secondary | ICD-10-CM | POA: Diagnosis not present

## 2017-01-24 DIAGNOSIS — R2689 Other abnormalities of gait and mobility: Secondary | ICD-10-CM | POA: Diagnosis not present

## 2017-01-24 LAB — BASIC METABOLIC PANEL
BUN: 18 mg/dL (ref 6–23)
CALCIUM: 9.5 mg/dL (ref 8.4–10.5)
CO2: 31 mEq/L (ref 19–32)
Chloride: 98 mEq/L (ref 96–112)
Creatinine, Ser: 0.89 mg/dL (ref 0.40–1.20)
GFR: 68.31 mL/min (ref >60.00)
GLUCOSE: 65 mg/dL — AB (ref 70–99)
Potassium: 4.2 mEq/L (ref 3.5–5.1)
SODIUM: 135 meq/L (ref 135–145)

## 2017-01-24 LAB — VITAMIN B12: Vitamin B-12: 890 pg/mL (ref 211–911)

## 2017-01-24 NOTE — Telephone Encounter (Signed)
Spoke with [employer].  She is agreeable to Neurology referral.

## 2017-01-24 NOTE — Assessment & Plan Note (Signed)
Unclear if bipolar medication contributing to balance issues.  Pt denies vertigo sensation or dizziness.  Given intermittant and nml neuro exam today.. Doubt CVA.  Consider neuro referral for further eval.

## 2017-01-24 NOTE — Telephone Encounter (Signed)
Copied from [place] #[id number]. Topic: Quick Communication - Office Called Patient >> Jan 24, 2017  3:41 PM Carter Kitten, CMA wrote: Reason for CRM: Need to know if patient is agreeable to Neurology referral, when she calls back. >> Jan 24, 2017  3:49 PM Robina Ade, Helene Kelp D wrote: Patient called wanting to talk to Maurice about her referral, please call patient back, thanks.

## 2017-01-24 NOTE — Progress Notes (Signed)
Subjective:    Patient ID: [name], female    DOB: [birth year], 61 y.o.   MRN: [id number]  HPI    61 year old female presents with multiple new issues.   1. gait problem, feels off balance on right  on 01/12/2017.Marland Kitchen When she woke off she  walked towards right. Sat back down, but then it occurred  Off and on over the following week.  no vertigo, no nausea  nml strength  Walking normal now.  2. Numbness on bottom lip ( entire lip), new on 10/26. Occurred at rest.  Lasted 24 hours. Now gone  3. Burning sensation in bottom of both feet, feels like walking on hot coals  Started on 10/28. Continue and present today. No numbness.  Nml fasting sugar in 11/2016   Father with neuropathy.. No DM  She has no new changes in meds. on latuda, seroquel She  was on gabapentin  In 09/2016 by Dr. Paulla Dolly podiatry for foot pain.Marland Kitchen Has not been on in a while.  Hx of thyroid issues, recently stable on current med dose   Review of Systems  Constitutional: Negative for fatigue and fever.  HENT: Negative for congestion.   Eyes: Negative for pain.  Respiratory: Negative for cough and shortness of breath.   Cardiovascular: Negative for chest pain, palpitations and leg swelling.  Gastrointestinal: Negative for abdominal pain.  Genitourinary: Negative for dysuria and vaginal bleeding.  Musculoskeletal: Negative for back pain.  Neurological: Negative for syncope, light-headedness and headaches.  Psychiatric/Behavioral: Negative for dysphoric mood.       Objective:   Physical Exam  Constitutional: She is oriented to person, place, and time. Vital signs are normal. She appears well-developed and well-nourished. She is cooperative.  Non-toxic appearance. She does not appear ill. No distress.  HENT:  Head: Normocephalic.  Right Ear: Hearing, tympanic membrane, external ear and ear canal normal. Tympanic membrane is not erythematous, not retracted and not bulging.  Left Ear: Hearing, tympanic membrane,  external ear and ear canal normal. Tympanic membrane is not erythematous, not retracted and not bulging.  Nose: No mucosal edema or rhinorrhea. Right sinus exhibits no maxillary sinus tenderness and no frontal sinus tenderness. Left sinus exhibits no maxillary sinus tenderness and no frontal sinus tenderness.  Mouth/Throat: Uvula is midline, oropharynx is clear and moist and mucous membranes are normal.  Eyes: Pupils are equal, round, and reactive to light. Conjunctivae, EOM and lids are normal. Lids are everted and swept, no foreign bodies found.  Neck: Trachea normal and normal range of motion. Neck supple. Carotid bruit is not present. No thyroid mass and no thyromegaly present.  Cardiovascular: Normal rate, regular rhythm, S1 normal, S2 normal, normal heart sounds, intact distal pulses and normal pulses.  Exam reveals no gallop and no friction rub.   No murmur heard. Pulmonary/Chest: Effort normal and breath sounds normal. No tachypnea. No respiratory distress. She has no decreased breath sounds. She has no wheezes. She has no rhonchi. She has no rales.  Abdominal: Soft. Normal appearance and bowel sounds are normal. There is no tenderness.  Neurological: She is alert and oriented to person, place, and time. She has normal strength and normal reflexes. A sensory deficit is present. No cranial nerve deficit. She exhibits normal muscle tone. She displays a negative Romberg sign. Coordination and gait normal. GCS eye subscore is 4. GCS verbal subscore is 5. GCS motor subscore is 6.  Nml cerebellar exam  nml gait  No papilledema   decreased monofilament  in toes bialterally, tender soles to touch  Skin: Skin is warm, dry and intact. No rash noted.  Psychiatric: She has a normal mood and affect. Her speech is normal and behavior is normal. Judgment and thought content normal. Her mood appears not anxious. Cognition and memory are normal. Cognition and memory are not impaired. She does not exhibit a  depressed mood. She exhibits normal recent memory and normal remote memory.          Assessment & Plan:

## 2017-01-24 NOTE — Assessment & Plan Note (Signed)
Will check B12, electrolytes and glucose. Recent TSH nml.  If nml consider referral  to neuro for nerve conduction.

## 2017-01-24 NOTE — Patient Instructions (Signed)
Please stop at the lab to have labs drawn.  

## 2017-01-25 NOTE — Telephone Encounter (Signed)
-----   Message from Carter Kitten, [place] sent at 01/24/2017  3:58 PM EDT ----- [name] is agreeable to Neurology referral.

## 2017-01-30 ENCOUNTER — Telehealth: Payer: Self-pay | Admitting: Family Medicine

## 2017-01-30 NOTE — Telephone Encounter (Signed)
Referral entered and states pt to be seen within 4 wks. pls advise

## 2017-01-30 NOTE — Telephone Encounter (Signed)
Called patient, gave her [place] office address and phone #. Sent Referral through Epic and patient is aware that they will call her directly to schedule.

## 2017-01-30 NOTE — Telephone Encounter (Signed)
Copied from [place] [phone]. Topic: Referral - Status >> Jan 30, 2017  9:56 AM Neva Seat wrote: Pt is waiting on referral since Nov. 1 of her last visit.  Please call her back ASAP due to discomfort and pain.

## 2017-01-31 DIAGNOSIS — R69 Illness, unspecified: Secondary | ICD-10-CM | POA: Diagnosis not present

## 2017-02-06 ENCOUNTER — Ambulatory Visit: Payer: Medicare HMO | Admitting: Podiatry

## 2017-02-12 DIAGNOSIS — R69 Illness, unspecified: Secondary | ICD-10-CM | POA: Diagnosis not present

## 2017-02-27 DIAGNOSIS — F3131 Bipolar disorder, current episode depressed, mild: Secondary | ICD-10-CM | POA: Diagnosis not present

## 2017-02-27 DIAGNOSIS — R69 Illness, unspecified: Secondary | ICD-10-CM | POA: Diagnosis not present

## 2017-02-28 DIAGNOSIS — R69 Illness, unspecified: Secondary | ICD-10-CM | POA: Diagnosis not present

## 2017-03-13 DIAGNOSIS — R69 Illness, unspecified: Secondary | ICD-10-CM | POA: Diagnosis not present

## 2017-03-13 DIAGNOSIS — F3131 Bipolar disorder, current episode depressed, mild: Secondary | ICD-10-CM | POA: Diagnosis not present

## 2017-03-20 ENCOUNTER — Telehealth: Payer: Self-pay | Admitting: Family Medicine

## 2017-03-20 NOTE — Telephone Encounter (Signed)
Copied from [place]. Topic: Quick Communication - See Telephone Encounter >> Mar 20, 2017  3:50 PM Oneta Rack wrote: CRM for notification. See Telephone encounter for:   03/20/17. Caller name: Lanelle Bal  Relation to pt: Pharmacy Tech [facility] medication management Call back number: [phone]  Reason for call:  Verify patient current medication for [employer] unable to reach patient, please advise

## 2017-03-20 NOTE — Telephone Encounter (Signed)
Unable to reach [place] at [phone].

## 2017-03-21 ENCOUNTER — Other Ambulatory Visit: Payer: Self-pay | Admitting: *Deleted

## 2017-03-21 NOTE — Telephone Encounter (Signed)
Spoke with Lea and medication list reviewed.

## 2017-04-01 ENCOUNTER — Encounter: Payer: Self-pay | Admitting: Family Medicine

## 2017-04-04 DIAGNOSIS — R69 Illness, unspecified: Secondary | ICD-10-CM | POA: Diagnosis not present

## 2017-04-19 DIAGNOSIS — F3131 Bipolar disorder, current episode depressed, mild: Secondary | ICD-10-CM | POA: Diagnosis not present

## 2017-04-19 DIAGNOSIS — R69 Illness, unspecified: Secondary | ICD-10-CM | POA: Diagnosis not present

## 2017-04-25 DIAGNOSIS — R69 Illness, unspecified: Secondary | ICD-10-CM | POA: Diagnosis not present

## 2017-04-25 DIAGNOSIS — F3131 Bipolar disorder, current episode depressed, mild: Secondary | ICD-10-CM | POA: Diagnosis not present

## 2017-05-02 ENCOUNTER — Encounter: Payer: Self-pay | Admitting: Family Medicine

## 2017-05-02 ENCOUNTER — Other Ambulatory Visit: Payer: Self-pay

## 2017-05-02 ENCOUNTER — Ambulatory Visit: Payer: Medicare HMO | Admitting: Family Medicine

## 2017-05-02 VITALS — BP 101/50 | HR 66 | Temp 98.4°F | Ht 62.5 in | Wt 155.8 lb

## 2017-05-02 DIAGNOSIS — R69 Illness, unspecified: Secondary | ICD-10-CM | POA: Diagnosis not present

## 2017-05-02 DIAGNOSIS — N898 Other specified noninflammatory disorders of vagina: Secondary | ICD-10-CM

## 2017-05-02 LAB — POCT WET PREP (WET MOUNT): Trichomonas Wet Prep HPF POC: ABSENT

## 2017-05-02 MED ORDER — METRONIDAZOLE 500 MG PO TABS
500.0000 mg | ORAL_TABLET | Freq: Two times a day (BID) | ORAL | 0 refills | Status: DC
Start: 2017-05-02 — End: 2017-05-22

## 2017-05-02 NOTE — Progress Notes (Signed)
62 year old female presents for vaginal  Itching, white discharge and possible STD exposure.  SUBJECTIVE:  62 y.o. female complains of  vaginal itching, no discharge for 1 weeks. Denies abnormal vaginal bleeding or significant pelvic pain or fever. No UTI symptoms.    Boyfriend has been cheating on her.  She is not interested in any HIV, herpes and syphilis testing.  No LMP recorded. Patient is postmenopausal.  OBJECTIVE:  She appears well, afebrile. Abdomen: benign, soft, nontender, no masses. Pelvic Exam: normal external genitalia, vulva, vagina, cervix, uterus and adnexa. Copius thin white discharge. Urine dipstick: not done.  ASSESSMENT:  bacterial vaginosis Also sent Gc/Chlam culture.  PLAN:  GC and chlamydia DNA  probe sent to lab. Treatment: Flagyl 500 BID x 7 days and abstain from coitus during course of treatment ROV prn if symptoms persist or worsen.

## 2017-05-02 NOTE — Patient Instructions (Signed)
We will call with  Culture results. Complete antibiotics.   Bacterial Vaginosis Bacterial vaginosis is an infection of the vagina. It happens when too many germs (bacteria) grow in the vagina. This infection puts you at risk for infections from sex (STIs). Treating this infection can lower your risk for some STIs. You should also treat this if you are pregnant. It can cause your baby to be born early. Follow these instructions at home: Medicines  Take over-the-counter and prescription medicines only as told by your doctor.  Take or use your antibiotic medicine as told by your doctor. Do not stop taking or using it even if you start to feel better. General instructions  If you your sexual partner is a woman, tell her that you have this infection. She needs to get treatment if she has symptoms. If you have a female partner, he does not need to be treated.  During treatment: ? Avoid sex. ? Do not douche. ? Avoid alcohol as told. ? Avoid breastfeeding as told.  Drink enough fluid to keep your pee (urine) clear or pale yellow.  Keep your vagina and butt (rectum) clean. ? Wash the area with warm water every day. ? Wipe from front to back after you use the toilet.  Keep all follow-up visits as told by your doctor. This is important. Preventing this condition  Do not douche.  Use only warm water to wash around your vagina.  Use protection when you have sex. This includes: ? Latex condoms. ? Dental dams.  Limit how many people you have sex with. It is best to only have sex with the same person (be monogamous).  Get tested for STIs. Have your partner get tested.  Wear underwear that is cotton or lined with cotton.  Avoid tight pants and pantyhose. This is most important in summer.  Do not use any products that have nicotine or tobacco in them. These include cigarettes and e-cigarettes. If you need help quitting, ask your doctor.  Do not use illegal drugs.  Limit how much  alcohol you drink. Contact a doctor if:  Your symptoms do not get better, even after you are treated.  You have more discharge or pain when you pee (urinate).  You have a fever.  You have pain in your belly (abdomen).  You have pain with sex.  Your bleed from your vagina between periods. Summary  This infection happens when too many germs (bacteria) grow in the vagina.  Treating this condition can lower your risk for some infections from sex (STIs).  You should also treat this if you are pregnant. It can cause early (premature) birth.  Do not stop taking or using your antibiotic medicine even if you start to feel better. This information is not intended to replace advice given to you by your health care provider. Make sure you discuss any questions you have with your health care provider. Document Released: 12/20/2007 Document Revised: 11/26/2015 Document Reviewed: 11/26/2015 Elsevier Interactive Patient Education  2017 [employer].

## 2017-05-03 DIAGNOSIS — R69 Illness, unspecified: Secondary | ICD-10-CM | POA: Diagnosis not present

## 2017-05-03 LAB — C. TRACHOMATIS/N. GONORRHOEAE RNA
C. TRACHOMATIS RNA, TMA: NOT DETECTED
N. GONORRHOEAE RNA, TMA: NOT DETECTED

## 2017-05-09 DIAGNOSIS — R69 Illness, unspecified: Secondary | ICD-10-CM | POA: Diagnosis not present

## 2017-05-14 ENCOUNTER — Other Ambulatory Visit: Payer: Self-pay

## 2017-05-14 MED ORDER — SYNTHROID 88 MCG PO TABS: 88.0000 ug | ORAL_TABLET | Freq: Every day | ORAL | 0 refills | Status: DC

## 2017-05-14 NOTE — Telephone Encounter (Signed)
Todd from [place] left v/m requesting refill synthroid 88 mcg namebrand; pt out of med. Last annual 06/14/16 and last thyroid lab 11/2016. Pt scheduled CPX on 06/18/17. Refilled x 1 until pt has CPX.

## 2017-05-16 DIAGNOSIS — R69 Illness, unspecified: Secondary | ICD-10-CM | POA: Diagnosis not present

## 2017-05-22 ENCOUNTER — Ambulatory Visit: Payer: Medicare HMO | Admitting: Neurology

## 2017-05-22 ENCOUNTER — Encounter: Payer: Self-pay | Admitting: Neurology

## 2017-05-22 VITALS — BP 111/63 | HR 64 | Ht 62.5 in | Wt 155.2 lb

## 2017-05-22 DIAGNOSIS — R202 Paresthesia of skin: Secondary | ICD-10-CM

## 2017-05-22 MED ORDER — DULOXETINE HCL 60 MG PO CPEP: 60.0000 mg | ORAL_CAPSULE | Freq: Every day | ORAL | 12 refills | Status: DC

## 2017-05-22 NOTE — Progress Notes (Signed)
PATIENT: [name] DOB: 01-Jun-1955  Chief Complaint  Patient presents with  . Peripheral Neuropathy    Reports burning, tingling pain in her bilateral feet.  She feels symptoms started after having a procedure for callus removal on both feet in 2017.  She is having difficulty walking due to her pain.  She was previously taking gabapentin 300mg , once daily that was helpful but she left her refills run out and just stopped the medication.  Marland Kitchen PCP    Jinny Sanders, MD     HISTORICAL  [name] is a 62 year old female, seen in refer by his primary care doctor Eliezer Lofts E for evaluation of peripheral neuropathy, initial evaluation was on May 22, 2017.  Reviewed and summarized the referring note, she has history of hypothyroidism, on supplement, bipolar disorder, hepatitis C.  She had long history of bilateral plantar feet painful callus, eventually received treatment in 2017, per patient, she was put on there, had callus removed at both feet, since then, she began to notice numbness tingling at the plantar surface, most noticeable when she bear weight, there was also recurrence of callus, painful upon weight-bearing, there was no ascending paresthesia over the years, the numbness tingling burning pain and stay at the bottom of her feet,  She denies bilateral upper extremity involvement, she denies significant low back pain, back pain, no bowel bladder incontinence.  Labs, BMP, normal B12 890, negative RPR, HSV antibody was elevated, normal vitamin D, thyroid functional tests, A1c was 5.1, LDL was 82, triglyceride was 82, decreased TSH 0.06  REVIEW OF SYSTEMS: Full 14 system review of systems performed and notable only for restless leg, dizziness, depression, anxiety, not enough sleep, change in appetite, disinterested in activities, racing thoughts, achy muscles, weight gain  ALLERGIES: Allergies  Allergen Reactions  . Demerol Anaphylaxis  . Penicillins Anaphylaxis, Hives and  Itching    Other reaction(s): ANAPHYLAXIS  . Aspirin Nausea And Vomiting    Other reaction(s): OTHER  . Oxycodone-Acetaminophen     Other reaction(s): RASH    HOME MEDICATIONS: Current Outpatient Medications  Medication Sig Dispense Refill  . albuterol (PROVENTIL HFA;VENTOLIN HFA) 108 (90 Base) MCG/ACT inhaler Inhale 2 puffs into the lungs every 6 (six) hours as needed for wheezing or shortness of breath. 1 Inhaler 2  . Capsaicin-Menthol-Methyl Sal (CAPSAICIN-METHYL SAL-MENTHOL) 0.025-1-12 % CREA TOPICALLY TO PAINFUL AREAS OF BOTH FEET 56.6 g 1  . furosemide (LASIX) 20 MG tablet Take 40 mg by mouth daily.     Marland Kitchen lamoTRIgine (LAMICTAL) 200 MG tablet Take 200 mg by mouth daily.    Marland Kitchen LATUDA 40 MG TABS tablet Take 40 mg by mouth daily with breakfast.     . lidocaine-prilocaine (EMLA) cream Apply 1 application topically as needed. Use 1 hour before appointment 30 g 2  . QUEtiapine (SEROQUEL) 100 MG tablet Take 100 mg by mouth every morning.     Marland Kitchen QUEtiapine (SEROQUEL) 300 MG tablet Take 300 mg by mouth at bedtime.     Marland Kitchen SPIRIVA HANDIHALER 18 MCG inhalation capsule PLACE 1 CAPSULE INTO INHALER AND INHALE ONCE DAILY AS DIRECTED 30 capsule 5  . spironolactone (ALDACTONE) 25 MG tablet Take 100 mg by mouth once.     Marland Kitchen SYNTHROID 88 MCG tablet Take 1 tablet (88 mcg total) by mouth daily. 90 tablet 0  . traZODone (DESYREL) 100 MG tablet Take 100 mg by mouth at bedtime as needed.      No current facility-administered medications for this visit.  PAST MEDICAL HISTORY: Past Medical History:  Diagnosis Date  . Acquired hypothyroidism    thyroid goiter s/p thyroidectomy  . Bipolar 1 disorder ([place]) 1990s  . Chronic hepatitis C (HCC)    genotype 1a  . Compensated HCV cirrhosis ([place])   . Complication of anesthesia    states 15 yrs ago, hard time waking up, states cant breathe when coming out of anesthesia  . Depression    states seen weekly at [facility]  . GERD (gastroesophageal  reflux disease)    Hpylori + and treated (2012)  . History of alcohol abuse   . Insomnia   . Migraines   . Portal hypertension ([place])   . Portal hypertensive gastropathy ([place]) 2013  . Smoker   . Varicose veins     PAST SURGICAL HISTORY: Past Surgical History:  Procedure Laterality Date  . APPENDECTOMY    . DIAGNOSTIC LAPAROSCOPY     15 yrs ago  . ESOPHAGOGASTRODUODENOSCOPY  10/2010   nl esophagus, HH, portal hypertensive gastropathy, erosive gastropathy, Hpylori + (Dr. Sydnee Levans)  . foot surgery Bilateral 2017   Callus removal  . left foot surgery  1990s  . LIVER BIOPSY    . THYROIDECTOMY  05/17/2011   Procedure: TOTAL THYROIDECTOMY;  Surgeon: Earnstine Regal, MD, benign goiter per path report    FAMILY HISTORY: Family History  Problem Relation Age of Onset  . Heart disease Mother        pacemaker  . Cancer Father        bladder/melanoma  . Diabetes Father   . Hypertension Father   . Breast cancer Neg Hx     SOCIAL HISTORY:  Social History   Socioeconomic History  . Marital status: Single    Spouse name: Not on file  . Number of children: 0  . Years of education: 56  . Highest education level: High school graduate  Social Needs  . Financial resource strain: Not on file  . Food insecurity - worry: Not on file  . Food insecurity - inability: Not on file  . Transportation needs - medical: Not on file  . Transportation needs - non-medical: Not on file  Occupational History  . Occupation: unemployed  Tobacco Use  . Smoking status: Current Some Day Smoker    Packs/day: 0.50    Years: 40.00    Pack years: 20.00    Types: Cigarettes  . Smokeless tobacco: Former Systems developer    Types: Chew  . Tobacco comment: decreasing use 05/30/2011  Substance and Sexual Activity  . Alcohol use: No  . Drug use: No    Comment: cocaine, lsd, marijuana, heroin  in 20's  . Sexual activity: Yes  Other Topics Concern  . Not on file  Social History Narrative   Lives alone.  Divorced.  No  children   Occupation: disability for bipolar   Activity: no regular exercise   Diet: some water, fruits/vegetables occasional   Right-handed.   3 cups caffeine per day.     PHYSICAL EXAM   Vitals:   05/22/17 1015  BP: 111/63  Pulse: 64  Weight: 155 lb 4 oz (70.4 kg)  Height: 5' 2.5" (1.588 m)    Not recorded      Body mass index is 27.94 kg/m.  PHYSICAL EXAMNIATION:  Gen: NAD, conversant, well nourised, obese, well groomed                     Cardiovascular: Regular rate rhythm, no  peripheral edema, warm, nontender. Eyes: Conjunctivae clear without exudates or hemorrhage Neck: Supple, no carotid bruits. Pulmonary: Clear to auscultation bilaterally  Musculoskeletal: Bilateral feet plantar surface callus, tenderness upon deep palpitation  NEUROLOGICAL EXAM:  MENTAL STATUS: Speech:    Speech is normal; fluent and spontaneous with normal comprehension.  Cognition:     Orientation to time, place and person     Normal recent and remote memory     Normal Attention span and concentration     Normal Language, naming, repeating,spontaneous speech     Fund of knowledge   CRANIAL NERVES: CN II: Visual fields are full to confrontation. Fundoscopic exam is normal with sharp discs and no vascular changes. Pupils are round equal and briskly reactive to light. CN III, IV, VI: extraocular movement are normal. No ptosis. CN V: Facial sensation is intact to pinprick in all 3 divisions bilaterally. Corneal responses are intact.  CN VII: Face is symmetric with normal eye closure and smile. CN VIII: Hearing is normal to rubbing fingers CN IX, X: Palate elevates symmetrically. Phonation is normal. CN XI: Head turning and shoulder shrug are intact CN XII: Tongue is midline with normal movements and no atrophy.  MOTOR: There is no pronator drift of out-stretched arms. Muscle bulk and tone are normal. Muscle strength is normal.  REFLEXES: Reflexes are 2+ and symmetric at the biceps,  triceps, knees, and ankles. Plantar responses are flexor.  SENSORY: Intact to light touch, pinprick, positional sensation and vibratory sensation are intact in fingers and toes.  COORDINATION: Rapid alternating movements and fine finger movements are intact. There is no dysmetria on finger-to-nose and heel-knee-shin.    GAIT/STANCE: Antalgic cautious gait   DIAGNOSTIC DATA (LABS, IMAGING, TESTING) - I reviewed patient records, labs, notes, testing and imaging myself where available.   ASSESSMENT AND PLAN  [name] is a 62 y.o. female    Bilateral feet pain, paresthesia, history of callus, status post procedure  EMG nerve conduction study for possible peripheral neuropathy, but the exam, and history are most consistent with callus or related procedure induced irritation.  Cymbalta 60 mg daily  Laboratory evaluations for treatable etiology   Marcial Pacas, M.D. Ph.D.  [facility] [address], [place], [place] [address] Ph: [phone] Fax: [phone]  CC: Jinny Sanders, MD

## 2017-05-24 ENCOUNTER — Telehealth: Payer: Self-pay | Admitting: *Deleted

## 2017-05-24 LAB — CK: CK TOTAL: 69 U/L (ref 24–173)

## 2017-05-24 LAB — ANA W/REFLEX IF POSITIVE: ANA: NEGATIVE

## 2017-05-24 LAB — PROTEIN ELECTROPHORESIS
A/G RATIO SPE: 0.9 (ref 0.7–1.7)
ALBUMIN ELP: 3.6 g/dL (ref 2.9–4.4)
Alpha 1: 0.3 g/dL (ref 0.0–0.4)
Alpha 2: 0.9 g/dL (ref 0.4–1.0)
BETA: 1.1 g/dL (ref 0.7–1.3)
Gamma Globulin: 2 g/dL — ABNORMAL HIGH (ref 0.4–1.8)
Globulin, Total: 4.2 g/dL — ABNORMAL HIGH (ref 2.2–3.9)
TOTAL PROTEIN: 7.8 g/dL (ref 6.0–8.5)

## 2017-05-24 LAB — SEDIMENTATION RATE: SED RATE: 18 mm/h (ref 0–40)

## 2017-05-24 LAB — COPPER, SERUM: COPPER: 125 ug/dL (ref 72–166)

## 2017-05-24 LAB — C-REACTIVE PROTEIN: CRP: 0.4 mg/L (ref 0.0–4.9)

## 2017-05-24 NOTE — Telephone Encounter (Signed)
-----   Message from Marcial Pacas, MD sent at 05/24/2017  8:44 AM EST ----- Laboratory evaluation showed no significant abnormality.

## 2017-05-24 NOTE — Telephone Encounter (Signed)
Spoke with [name] and per YY, reviewed below lab results.  She verbalized understanding of same/fim

## 2017-05-30 DIAGNOSIS — R69 Illness, unspecified: Secondary | ICD-10-CM | POA: Diagnosis not present

## 2017-05-31 ENCOUNTER — Ambulatory Visit (INDEPENDENT_AMBULATORY_CARE_PROVIDER_SITE_OTHER): Payer: Medicare HMO | Admitting: Family Medicine

## 2017-05-31 ENCOUNTER — Encounter: Payer: Self-pay | Admitting: Family Medicine

## 2017-05-31 ENCOUNTER — Other Ambulatory Visit: Payer: Self-pay

## 2017-05-31 DIAGNOSIS — M7551 Bursitis of right shoulder: Secondary | ICD-10-CM | POA: Diagnosis not present

## 2017-05-31 MED ORDER — DICLOFENAC SODIUM 75 MG PO TBEC: 75.0000 mg | DELAYED_RELEASE_TABLET | Freq: Two times a day (BID) | ORAL | 0 refills | Status: DC

## 2017-05-31 NOTE — Assessment & Plan Note (Signed)
NSAIDs, Ice and home PT. Consider referral for steroid injection if not improving in 2 weeks.

## 2017-05-31 NOTE — Patient Instructions (Signed)
Stop OTC meds.  Start diclofenac 75 mg twice daily.  Ice and start home PT.  Call if pain is not improving  In next 2 weeks.

## 2017-05-31 NOTE — Progress Notes (Signed)
   Subjective:    Patient ID: [name], female    DOB: 07/22/1955, 62 y.o.   MRN: [id number]  HPI   62 year old female pt presents with new onset right shoulder pain in last 3 weeks.   She denies fall, no known injury. No change in activity.   Pain is in anterior right shoulder. Pain with int rotation and abduction.  Pain with putting weight on arm   no neck pain, no numbness, no weakness.   She has been using heat, ice, advil 400 mg twice daily, aleve.  No relief.  Review of Systems  Constitutional: Negative for fatigue and fever.  HENT: Negative for congestion.   Eyes: Negative for pain.  Respiratory: Negative for cough and shortness of breath.   Cardiovascular: Negative for chest pain, palpitations and leg swelling.  Gastrointestinal: Negative for abdominal pain.  Genitourinary: Negative for dysuria and vaginal bleeding.  Musculoskeletal: Negative for back pain.  Neurological: Negative for syncope, light-headedness and headaches.  Psychiatric/Behavioral: Negative for dysphoric mood.       Objective:   Physical Exam  Constitutional: Vital signs are normal. She appears well-developed and well-nourished. She is cooperative.  Non-toxic appearance. She does not appear ill. No distress.  HENT:  Head: Normocephalic.  Right Ear: Hearing, tympanic membrane, external ear and ear canal normal. Tympanic membrane is not erythematous, not retracted and not bulging.  Left Ear: Hearing, tympanic membrane, external ear and ear canal normal. Tympanic membrane is not erythematous, not retracted and not bulging.  Nose: No mucosal edema or rhinorrhea. Right sinus exhibits no maxillary sinus tenderness and no frontal sinus tenderness. Left sinus exhibits no maxillary sinus tenderness and no frontal sinus tenderness.  Mouth/Throat: Uvula is midline, oropharynx is clear and moist and mucous membranes are normal.  Eyes: Conjunctivae, EOM and lids are normal. Pupils are equal, round, and reactive  to light. Lids are everted and swept, no foreign bodies found.  Neck: Trachea normal and normal range of motion. Neck supple. Carotid bruit is not present. No thyroid mass and no thyromegaly present.  Cardiovascular: Normal rate, regular rhythm, S1 normal, S2 normal, normal heart sounds, intact distal pulses and normal pulses. Exam reveals no gallop and no friction rub.  No murmur heard. Pulmonary/Chest: Effort normal and breath sounds normal. No tachypnea. No respiratory distress. She has no decreased breath sounds. She has no wheezes. She has no rhonchi. She has no rales.  Abdominal: Soft. Normal appearance and bowel sounds are normal. There is no tenderness.  Musculoskeletal:       Right shoulder: She exhibits decreased range of motion and tenderness. She exhibits no bony tenderness.       Left shoulder: She exhibits normal range of motion and no tenderness.       Cervical back: Normal. She exhibits normal range of motion and no tenderness.  ttp over lateral shoudler, no subacromia;l pain.  Neg drop arm, pain with int rotation and abduction.  neg Neer's  Neurological: She is alert.  Skin: Skin is warm, dry and intact. No rash noted.  Psychiatric: Her speech is normal and behavior is normal. Judgment and thought content normal. Her mood appears not anxious. Cognition and memory are normal. She does not exhibit a depressed mood.          Assessment & Plan:

## 2017-06-13 ENCOUNTER — Telehealth: Payer: Self-pay | Admitting: Family Medicine

## 2017-06-13 DIAGNOSIS — R69 Illness, unspecified: Secondary | ICD-10-CM | POA: Diagnosis not present

## 2017-06-13 DIAGNOSIS — Z13220 Encounter for screening for lipoid disorders: Secondary | ICD-10-CM

## 2017-06-13 DIAGNOSIS — E890 Postprocedural hypothyroidism: Secondary | ICD-10-CM

## 2017-06-13 NOTE — Telephone Encounter (Signed)
-----   Message from Eustace Pen, LPN sent at 7/53/0051  4:44 PM EDT ----- Regarding: Labs 3/21 Lab orders needed. Thank you.  Insurance:  [employer]

## 2017-06-14 ENCOUNTER — Ambulatory Visit (INDEPENDENT_AMBULATORY_CARE_PROVIDER_SITE_OTHER): Payer: Medicare HMO

## 2017-06-14 ENCOUNTER — Ambulatory Visit: Payer: Medicare HMO

## 2017-06-14 ENCOUNTER — Other Ambulatory Visit (INDEPENDENT_AMBULATORY_CARE_PROVIDER_SITE_OTHER): Payer: Medicare HMO

## 2017-06-14 ENCOUNTER — Encounter: Payer: Self-pay | Admitting: Internal Medicine

## 2017-06-14 ENCOUNTER — Ambulatory Visit (INDEPENDENT_AMBULATORY_CARE_PROVIDER_SITE_OTHER)
Admission: RE | Admit: 2017-06-14 | Discharge: 2017-06-14 | Disposition: A | Discharge status: Home / Self Care | Payer: Medicare HMO | Source: Ambulatory Visit | Attending: Internal Medicine | Admitting: Internal Medicine

## 2017-06-14 ENCOUNTER — Ambulatory Visit (INDEPENDENT_AMBULATORY_CARE_PROVIDER_SITE_OTHER): Payer: Medicare HMO | Admitting: Internal Medicine

## 2017-06-14 VITALS — BP 84/60 | HR 64 | Temp 98.0°F | Ht 63.0 in | Wt 155.5 lb

## 2017-06-14 VITALS — BP 100/60 | HR 71 | Temp 97.6°F | Ht 62.5 in | Wt 155.0 lb

## 2017-06-14 DIAGNOSIS — R109 Unspecified abdominal pain: Secondary | ICD-10-CM

## 2017-06-14 DIAGNOSIS — Z0000 Encounter for general adult medical examination without abnormal findings: Secondary | ICD-10-CM | POA: Diagnosis not present

## 2017-06-14 DIAGNOSIS — R911 Solitary pulmonary nodule: Secondary | ICD-10-CM | POA: Insufficient documentation

## 2017-06-14 DIAGNOSIS — Z13220 Encounter for screening for lipoid disorders: Secondary | ICD-10-CM | POA: Diagnosis not present

## 2017-06-14 DIAGNOSIS — R10A2 Flank pain, left side: Secondary | ICD-10-CM

## 2017-06-14 DIAGNOSIS — S20212A Contusion of left front wall of thorax, initial encounter: Secondary | ICD-10-CM | POA: Diagnosis not present

## 2017-06-14 DIAGNOSIS — S299XXA Unspecified injury of thorax, initial encounter: Secondary | ICD-10-CM | POA: Diagnosis not present

## 2017-06-14 DIAGNOSIS — R0781 Pleurodynia: Secondary | ICD-10-CM

## 2017-06-14 DIAGNOSIS — W19XXXA Unspecified fall, initial encounter: Secondary | ICD-10-CM | POA: Diagnosis not present

## 2017-06-14 LAB — COMPREHENSIVE METABOLIC PANEL
ALBUMIN: 4.2 g/dL (ref 3.5–5.2)
ALT: 20 U/L (ref 0–35)
AST: 29 U/L (ref 0–37)
Alkaline Phosphatase: 101 U/L (ref 39–117)
BUN: 22 mg/dL (ref 6–23)
CALCIUM: 9.6 mg/dL (ref 8.4–10.5)
CHLORIDE: 98 meq/L (ref 96–112)
CO2: 27 mEq/L (ref 19–32)
Creatinine, Ser: 0.96 mg/dL (ref 0.40–1.20)
GFR: 62.52 mL/min (ref >60.00)
Glucose, Bld: 97 mg/dL (ref 70–99)
POTASSIUM: 4.2 meq/L (ref 3.5–5.1)
Sodium: 133 mEq/L — ABNORMAL LOW (ref 135–145)
Total Bilirubin: 1 mg/dL (ref 0.2–1.2)
Total Protein: 8.3 g/dL (ref 6.0–8.3)

## 2017-06-14 LAB — LIPID PANEL
CHOLESTEROL: 156 mg/dL (ref 0–200)
HDL: 50.2 mg/dL (ref >39.00)
LDL CALC: 86 mg/dL (ref 0–99)
NonHDL: 105.3
TRIGLYCERIDES: 98 mg/dL (ref 0.0–149.0)
Total CHOL/HDL Ratio: 3
VLDL: 19.6 mg/dL (ref 0.0–40.0)

## 2017-06-14 NOTE — Progress Notes (Signed)
PCP notes:   Health maintenance:  Colon cancer screening - PCP please address at next appt  Abnormal screenings:   Fall risk - hx of multiple falls Fall Risk  06/14/2017 06/13/2016 06/07/2015 06/03/2014 05/15/2012  Falls in the past year? Yes Yes Yes Yes Yes  Comment 6 falls in past 12 mths; minor injuries; no medical treatment pt reports multiple falls with injury; pt states she has fallen on ice and out of bed; no medical treatment - - -  Number falls in past yr: 2 or more 2 or more 2 or more 2 or more 2 or more  Injury with Fall? Yes Yes No No -  Risk Factor Category  - - - High Fall Risk High Fall Risk  Risk for fall due to : History of fall(s) - - History of fall(s) -   Depression score: 12 Depression screen [facility] 2/9 06/14/2017 06/13/2016 06/07/2015 06/03/2014 05/15/2012  Decreased Interest 2 2 1  0 0  Down, Depressed, Hopeless 2 2 1  0 1  PHQ - 2 Score 4 4 2  0 1  Altered sleeping 1 3 3  - -  Tired, decreased energy 0 2 3 - -  Change in appetite 3 3 3  - -  Feeling bad or failure about yourself  2 3 3  - -  Trouble concentrating 1 2 3  - -  Moving slowly or fidgety/restless 1 3 3  - -  Suicidal thoughts 0 2 0 - -  PHQ-9 Score 12 22 20  - -  Difficult doing work/chores Somewhat difficult (No Data) Very difficult - -   Patient concerns:   Pt reports left rib pain related to a fall that occurred approx. 3 weeks ago. Pt states pain has become increasingly worse and it has begun to affect quality of sleep. Denies SOB but states "it hurts to cough". PCP notified. Due to schedule limitations, pt was scheduled same day acute appt with another provider.   Nurse concerns:  None  Next PCP appt:   06/18/2017 @ 1000

## 2017-06-14 NOTE — Progress Notes (Signed)
Subjective:    Patient ID: [name], female    DOB: 1955/12/08, 62 y.o.   MRN: [id number]  HPI Here due to left flank pain  [place] out of bed--- 3 weeks ago [place] onto the left side Sat briefly--able to get up Noticed pain later in the day or next day Ongoing pain along left lower ribs and up towards the axilla--just won't go away May have a knot there  Has tried ice, heat, aleve, advil The heat does help her sleep  Some dry cough Slight SOB---same as usual Still smokes under 1/2PPD---not ready to quit but is decreasing  Current Outpatient Medications on File Prior to Visit  Medication Sig Dispense Refill  . albuterol (PROVENTIL HFA;VENTOLIN HFA) 108 (90 Base) MCG/ACT inhaler Inhale 2 puffs into the lungs every 6 (six) hours as needed for wheezing or shortness of breath. 1 Inhaler 2  . furosemide (LASIX) 20 MG tablet Take 40 mg by mouth daily.     Marland Kitchen lamoTRIgine (LAMICTAL) 200 MG tablet Take 200 mg by mouth daily.    Marland Kitchen LATUDA 40 MG TABS tablet Take 40 mg by mouth daily with breakfast.     . lidocaine-prilocaine (EMLA) cream Apply 1 application topically as needed. Use 1 hour before appointment 30 g 2  . QUEtiapine (SEROQUEL) 100 MG tablet Take 100 mg by mouth every morning.     Marland Kitchen QUEtiapine (SEROQUEL) 300 MG tablet Take 300 mg by mouth at bedtime.     Marland Kitchen spironolactone (ALDACTONE) 25 MG tablet Take 100 mg by mouth once.     Marland Kitchen SYNTHROID 88 MCG tablet Take 1 tablet (88 mcg total) by mouth daily. 90 tablet 0  . traZODone (DESYREL) 100 MG tablet Take 100 mg by mouth at bedtime as needed.      No current facility-administered medications on file prior to visit.     Allergies  Allergen Reactions  . Demerol Anaphylaxis  . Penicillins Anaphylaxis, Hives and Itching    Other reaction(s): ANAPHYLAXIS  . Aspirin Nausea And Vomiting    Other reaction(s): OTHER  . Oxycodone-Acetaminophen     Other reaction(s): RASH    Past Medical History:  Diagnosis Date  . Acquired  hypothyroidism    thyroid goiter s/p thyroidectomy  . Bipolar 1 disorder ([place]) 1990s  . Chronic hepatitis C (HCC)    genotype 1a  . Compensated HCV cirrhosis ([place])   . Complication of anesthesia    states 15 yrs ago, hard time waking up, states cant breathe when coming out of anesthesia  . Depression    states seen weekly at [facility]  . GERD (gastroesophageal reflux disease)    Hpylori + and treated (2012)  . History of alcohol abuse   . Insomnia   . Migraines   . Portal hypertension ([place])   . Portal hypertensive gastropathy ([place]) 2013  . Smoker   . Varicose veins     Past Surgical History:  Procedure Laterality Date  . APPENDECTOMY    . DIAGNOSTIC LAPAROSCOPY     15 yrs ago  . ESOPHAGOGASTRODUODENOSCOPY  10/2010   nl esophagus, HH, portal hypertensive gastropathy, erosive gastropathy, Hpylori + (Dr. Sydnee Levans)  . foot surgery Bilateral 2017   Callus removal  . left foot surgery  1990s  . LIVER BIOPSY    . THYROIDECTOMY  05/17/2011   Procedure: TOTAL THYROIDECTOMY;  Surgeon: Earnstine Regal, MD, benign goiter per path report    Family History  Problem Relation Age of Onset  .  Heart disease Mother        pacemaker  . Cancer Father        bladder/melanoma  . Diabetes Father   . Hypertension Father   . Breast cancer Neg Hx     Social History   Socioeconomic History  . Marital status: Single    Spouse name: Not on file  . Number of children: 0  . Years of education: 85  . Highest education level: High school graduate  Occupational History  . Occupation: unemployed  Social Needs  . Financial resource strain: Not on file  . Food insecurity:    Worry: Not on file    Inability: Not on file  . Transportation needs:    Medical: Not on file    Non-medical: Not on file  Tobacco Use  . Smoking status: Current Some Day Smoker    Packs/day: 0.50    Years: 40.00    Pack years: 20.00    Types: Cigarettes  . Smokeless tobacco: Former Systems developer    Types: Chew    . Tobacco comment: decreasing use 05/30/2011  Substance and Sexual Activity  . Alcohol use: No  . Drug use: No    Comment: cocaine, lsd, marijuana, heroin  in 20's  . Sexual activity: Yes  Lifestyle  . Physical activity:    Days per week: Not on file    Minutes per session: Not on file  . Stress: Not on file  Relationships  . Social connections:    Talks on phone: Not on file    Gets together: Not on file    Attends religious service: Not on file    Active member of club or organization: Not on file    Attends meetings of clubs or organizations: Not on file    Relationship status: Not on file  . Intimate partner violence:    Fear of current or ex partner: Not on file    Emotionally abused: Not on file    Physically abused: Not on file    Forced sexual activity: Not on file  Other Topics Concern  . Not on file  Social History Narrative   Lives alone.  Divorced.  No children   Occupation: disability for bipolar   Activity: no regular exercise   Diet: some water, fruits/vegetables occasional   Right-handed.   3 cups caffeine per day.   Review of Systems  Not much appetite--no change No N/V     Objective:   Physical Exam  Neck: No thyromegaly present.  Cardiovascular: Normal rate, regular rhythm and normal heart sounds. Exam reveals no gallop.  No murmur heard. Pulmonary/Chest: Effort normal and breath sounds normal. No respiratory distress. She has no wheezes. She has no rales.  Tenderness along left 8-9th ribs along mid to anterior axillary line  Lymphadenopathy:    She has no cervical adenopathy.          Assessment & Plan:

## 2017-06-14 NOTE — Progress Notes (Signed)
I reviewed health advisor's note, was available for consultation, and agree with documentation and plan.  

## 2017-06-14 NOTE — Progress Notes (Signed)
Subjective:   [name] is a 62 y.o. female who presents for Medicare Annual (Subsequent) preventive examination.  Review of Systems:  N/A Cardiac Risk Factors include: advanced age (>17men, >75 women)     Objective:     Vitals: BP (!) 84/60 (BP Location: Right Arm, Patient Position: Sitting, Cuff Size: Normal)   Pulse 64   Temp 98 F (36.7 C) (Oral)   Ht 5\' 3"  (1.6 m) Comment: no shoes  Wt 155 lb 8 oz (70.5 kg)   SpO2 95%   BMI 27.55 kg/m   Body mass index is 27.55 kg/m.  Advanced Directives 06/14/2017 06/13/2016 01/25/2015 05/20/2011 05/17/2011 05/14/2011  Does Patient Have a Medical Advance Directive? No No No Patient does not have advance directive;Patient would not like information Patient does not have advance directive;Patient would not like information Patient does not have advance directive  Would patient like information on creating a medical advance directive? Yes (MAU/Ambulatory/Procedural Areas - Information given) - No - patient declined information - - -  Pre-existing out of facility DNR order (yellow form or pink MOST form) - - - No Yes, notify physician for inpatient order -    Tobacco Social History   Tobacco Use  Smoking Status Current Some Day Smoker  . Packs/day: 0.50  . Years: 40.00  . Pack years: 20.00  . Types: Cigarettes  Smokeless Tobacco Former Systems developer  . Types: Chew  Tobacco Comment   decreasing use 05/30/2011     Ready to quit: No Counseling given: No Comment: decreasing use 05/30/2011   Clinical Intake:  Pre-visit preparation completed: Yes  Pain : 0-10 Pain Score: 8  Pain Type: Chronic pain Pain Location: Generalized     Nutritional Status: BMI 25 -29 Overweight Nutritional Risks: None Diabetes: No  How often do you need to have someone help you when you read instructions, pamphlets, or other written materials from your doctor or pharmacy?: 1 - Never What is the last grade level you completed in school?: 11th grade  Interpreter  Needed?: No  Comments: pt lives alone Information entered by :: LPinson, LPN  Past Medical History:  Diagnosis Date  . Acquired hypothyroidism    thyroid goiter s/p thyroidectomy  . Bipolar 1 disorder ([place]) 1990s  . Chronic hepatitis C (HCC)    genotype 1a  . Compensated HCV cirrhosis ([place])   . Complication of anesthesia    states 15 yrs ago, hard time waking up, states cant breathe when coming out of anesthesia  . Depression    states seen weekly at [facility]  . GERD (gastroesophageal reflux disease)    Hpylori + and treated (2012)  . History of alcohol abuse   . Insomnia   . Migraines   . Portal hypertension ([place])   . Portal hypertensive gastropathy ([place]) 2013  . Smoker   . Varicose veins    Past Surgical History:  Procedure Laterality Date  . APPENDECTOMY    . DIAGNOSTIC LAPAROSCOPY     15 yrs ago  . ESOPHAGOGASTRODUODENOSCOPY  10/2010   nl esophagus, HH, portal hypertensive gastropathy, erosive gastropathy, Hpylori + (Dr. Sydnee Levans)  . foot surgery Bilateral 2017   Callus removal  . left foot surgery  1990s  . LIVER BIOPSY    . THYROIDECTOMY  05/17/2011   Procedure: TOTAL THYROIDECTOMY;  Surgeon: Earnstine Regal, MD, benign goiter per path report   Family History  Problem Relation Age of Onset  . Heart disease Mother  pacemaker  . Cancer Father        bladder/melanoma  . Diabetes Father   . Hypertension Father   . Breast cancer Neg Hx    Social History   Socioeconomic History  . Marital status: Single    Spouse name: Not on file  . Number of children: 0  . Years of education: 64  . Highest education level: High school graduate  Occupational History  . Occupation: unemployed  Social Needs  . Financial resource strain: Not on file  . Food insecurity:    Worry: Not on file    Inability: Not on file  . Transportation needs:    Medical: Not on file    Non-medical: Not on file  Tobacco Use  . Smoking status: Current Some Day Smoker     Packs/day: 0.50    Years: 40.00    Pack years: 20.00    Types: Cigarettes  . Smokeless tobacco: Former Systems developer    Types: Chew  . Tobacco comment: decreasing use 05/30/2011  Substance and Sexual Activity  . Alcohol use: No  . Drug use: No    Comment: cocaine, lsd, marijuana, heroin  in 20's  . Sexual activity: Yes  Lifestyle  . Physical activity:    Days per week: Not on file    Minutes per session: Not on file  . Stress: Not on file  Relationships  . Social connections:    Talks on phone: Not on file    Gets together: Not on file    Attends religious service: Not on file    Active member of club or organization: Not on file    Attends meetings of clubs or organizations: Not on file    Relationship status: Not on file  Other Topics Concern  . Not on file  Social History Narrative   Lives alone.  Divorced.  No children   Occupation: disability for bipolar   Activity: no regular exercise   Diet: some water, fruits/vegetables occasional   Right-handed.   3 cups caffeine per day.    Outpatient Encounter Medications as of 06/14/2017  Medication Sig  . furosemide (LASIX) 20 MG tablet Take 40 mg by mouth daily.   Marland Kitchen lamoTRIgine (LAMICTAL) 200 MG tablet Take 200 mg by mouth daily.  . QUEtiapine (SEROQUEL) 100 MG tablet Take 100 mg by mouth every morning.   Marland Kitchen QUEtiapine (SEROQUEL) 300 MG tablet Take 300 mg by mouth at bedtime.   Marland Kitchen spironolactone (ALDACTONE) 25 MG tablet Take 100 mg by mouth once.   Marland Kitchen SYNTHROID 88 MCG tablet Take 1 tablet (88 mcg total) by mouth daily.  . traZODone (DESYREL) 100 MG tablet Take 100 mg by mouth at bedtime as needed.   Marland Kitchen albuterol (PROVENTIL HFA;VENTOLIN HFA) 108 (90 Base) MCG/ACT inhaler Inhale 2 puffs into the lungs every 6 (six) hours as needed for wheezing or shortness of breath.  Marland Kitchen LATUDA 40 MG TABS tablet Take 40 mg by mouth daily with breakfast.   . lidocaine-prilocaine (EMLA) cream Apply 1 application topically as needed. Use 1 hour before  appointment  . [DISCONTINUED] Capsaicin-Menthol-Methyl Sal (CAPSAICIN-METHYL SAL-MENTHOL) 0.025-1-12 % CREA TOPICALLY TO PAINFUL AREAS OF BOTH FEET  . [DISCONTINUED] diclofenac (VOLTAREN) 75 MG EC tablet Take 1 tablet (75 mg total) by mouth 2 (two) times daily.  . [DISCONTINUED] DULoxetine (CYMBALTA) 60 MG capsule Take 1 capsule (60 mg total) by mouth daily.  . [DISCONTINUED] SPIRIVA HANDIHALER 18 MCG inhalation capsule PLACE 1 CAPSULE INTO INHALER AND INHALE  ONCE DAILY AS DIRECTED   No facility-administered encounter medications on file as of 06/14/2017.     Activities of Daily Living In your present state of health, do you have any difficulty performing the following activities: 06/14/2017  Hearing? N  Vision? N  Difficulty concentrating or making decisions? N  Walking or climbing stairs? N  Dressing or bathing? N  Doing errands, shopping? N  Preparing Food and eating ? N  Using the Toilet? N  In the past six months, have you accidently leaked urine? N  Do you have problems with loss of bowel control? N  Managing your Medications? N  Managing your Finances? N  Housekeeping or managing your Housekeeping? N  Some recent data might be hidden    Patient Care Team: Jinny Sanders, MD as PCP - General (Family Medicine)    Assessment:   This is a routine wellness examination for [place].  Exercise Activities and Dietary recommendations Current Exercise Habits: Home exercise routine, Type of exercise: walking, Time (Minutes): 30, Frequency (Times/Week): 7, Weekly Exercise (Minutes/Week): 210, Intensity: Moderate, Exercise limited by: None identified  Goals    . Increase physical activity     Starting 06/14/2017, I will continue to walk at least 30 minutes daily.        Fall Risk Fall Risk  06/14/2017 06/13/2016 06/07/2015 06/03/2014 05/15/2012  Falls in the past year? Yes Yes Yes Yes Yes  Comment 6 falls in past 12 mths; minor injuries; no medical treatment pt reports multiple falls  with injury; pt states she has fallen on ice and out of bed; no medical treatment - - -  Number falls in past yr: 2 or more 2 or more 2 or more 2 or more 2 or more  Injury with Fall? Yes Yes No No -  Risk Factor Category  - - - High Fall Risk High Fall Risk  Risk for fall due to : History of fall(s) - - History of fall(s) -   Depression Screen PHQ 2/9 Scores 06/14/2017 06/13/2016 06/07/2015 06/03/2014  PHQ - 2 Score 4 4 2  0  PHQ- 9 Score 12 22 20  -     Cognitive Function MMSE - Mini Mental State Exam 06/14/2017 06/13/2016  Orientation to time 5 5  Orientation to Place 5 5  Registration 3 3  Attention/ Calculation 0 0  Recall 3 3  Language- name 2 objects 0 0  Language- repeat 1 1  Language- follow 3 step command 3 3  Language- read & follow direction 0 0  Write a sentence 0 0  Copy design 0 0  Total score 20 20       PLEASE NOTE: A Mini-Cog screen was completed. Maximum score is 20. A value of 0 denotes this part of [place] MMSE was not completed or the patient failed this part of the Mini-Cog screening.   Mini-Cog Screening Orientation to Time - Max 5 pts Orientation to Place - Max 5 pts Registration - Max 3 pts Recall - Max 3 pts Language Repeat - Max 1 pts Language Follow 3 Step Command - Max 3 pts   Immunization History  Administered Date(s) Administered  . Hepatitis A 03/26/1998, 03/27/1999  . Hepatitis B 03/26/1998, 03/27/1999, 03/26/2000  . Influenza,inj,Quad PF,6+ Mos 02/02/2016, 12/26/2016  . Pneumococcal Conjugate-13 12/26/2016    Screening Tests Health Maintenance  Topic Date Due  . TETANUS/TDAP  12/12/2017 (Originally 01/30/1975)  . Fecal DNA (Cologuard)  06/15/2018 (Originally 01/30/2005)  . MAMMOGRAM  08/18/2018  .  PAP SMEAR  06/15/2019  . INFLUENZA VACCINE  Completed  . Hepatitis C Screening  Completed  . HIV Screening  Completed      Plan:     I have personally reviewed, addressed, and noted the following in the patient's chart:  A. Medical and  social history B. Use of alcohol, tobacco or illicit drugs  C. Current medications and supplements D. Functional ability and status E.  Nutritional status F.  Physical activity G. Advance directives H. List of other physicians I.  Hospitalizations, surgeries, and ER visits in previous 12 months J.  [place] to include hearing, vision, cognitive, depression L. Referrals and appointments - none  In addition, I have reviewed and discussed with patient certain preventive protocols, quality metrics, and best practice recommendations. A written personalized care plan for preventive services as well as general preventive health recommendations were provided to patient.  See attached scanned questionnaire for additional information.   Signed,   Lindell Noe, MHA, BS, LPN Health Coach

## 2017-06-14 NOTE — Patient Instructions (Signed)
[name] , Thank you for taking time to come for your Medicare Wellness Visit. I appreciate your ongoing commitment to your health goals. Please review the following plan we discussed and let me know if I can assist you in the future.   These are the goals we discussed: Goals    . Increase physical activity     Starting 06/14/2017, I will continue to walk at least 30 minutes daily.        This is a list of the screening recommended for you and due dates:  Health Maintenance  Topic Date Due  . Tetanus Vaccine  12/12/2017*  . Cologuard (Stool DNA test)  06/15/2018*  . Mammogram  08/18/2018  . Pap Smear  06/15/2019  . Flu Shot  Completed  .  Hepatitis C: One time screening is recommended by Center for Disease Control  (CDC) for  adults born from [birth year] through 1966.   Completed  . HIV Screening  Completed  *Topic was postponed. The date shown is not the original due date.   Preventive Care for Adults  A healthy lifestyle and preventive care can promote health and wellness. Preventive health guidelines for adults include the following key practices.  . A routine yearly physical is a good way to check with your health care provider about your health and preventive screening. It is a chance to share any concerns and updates on your health and to receive a thorough exam.  . Visit your dentist for a routine exam and preventive care every 6 months. Brush your teeth twice a day and floss once a day. Good oral hygiene prevents tooth decay and gum disease.  . The frequency of eye exams is based on your age, health, family medical history, use  of contact lenses, and other factors. Follow your health care provider's recommendations for frequency of eye exams.  . Eat a healthy diet. Foods like vegetables, fruits, whole grains, low-fat dairy products, and lean protein foods contain the nutrients you need without too many calories. Decrease your intake of foods high in solid fats, added sugars, and  salt. Eat the right amount of calories for you. Get information about a proper diet from your health care provider, if necessary.  . Regular physical exercise is one of the most important things you can do for your health. Most adults should get at least 150 minutes of moderate-intensity exercise (any activity that increases your heart rate and causes you to sweat) each week. In addition, most adults need muscle-strengthening exercises on 2 or more days a week.  Silver Sneakers may be a benefit available to you. To determine eligibility, you may visit the website: www.silversneakers.com or contact program at (873)812-8651 Mon-Fri between 8AM-8PM.   . Maintain a healthy weight. The body mass index (BMI) is a screening tool to identify possible weight problems. It provides an estimate of body fat based on height and weight. Your health care provider can find your BMI and can help you achieve or maintain a healthy weight.   For adults 20 years and older: ? A BMI below 18.5 is considered underweight. ? A BMI of 18.5 to 24.9 is normal. ? A BMI of 25 to 29.9 is considered overweight. ? A BMI of 30 and above is considered obese.   . Maintain normal blood lipids and cholesterol levels by exercising and minimizing your intake of saturated fat. Eat a balanced diet with plenty of fruit and vegetables. Blood tests for lipids and cholesterol should begin  at age 27 and be repeated every 5 years. If your lipid or cholesterol levels are high, you are over 50, or you are at high risk for heart disease, you may need your cholesterol levels checked more frequently. Ongoing high lipid and cholesterol levels should be treated with medicines if diet and exercise are not working.  . If you smoke, find out from your health care provider how to quit. If you do not use tobacco, please do not start.  . If you choose to drink alcohol, please do not consume more than 2 drinks per day. One drink is considered to be 12 ounces  (355 mL) of beer, 5 ounces (148 mL) of wine, or 1.5 ounces (44 mL) of liquor.  . If you are 50-32 years old, ask your health care provider if you should take aspirin to prevent strokes.  . Use sunscreen. Apply sunscreen liberally and repeatedly throughout the day. You should seek shade when your shadow is shorter than you. Protect yourself by wearing long sleeves, pants, a wide-brimmed hat, and sunglasses year round, whenever you are outdoors.  . Once a month, do a whole body skin exam, using a mirror to look at the skin on your back. Tell your health care provider of new moles, moles that have irregular borders, moles that are larger than a pencil eraser, or moles that have changed in shape or color.

## 2017-06-14 NOTE — Assessment & Plan Note (Signed)
CXR shows no rib fracture, pleural effusion, etc Discussed symptomatic relief and the usual extended time it takes for this to resolve

## 2017-06-14 NOTE — Assessment & Plan Note (Signed)
Density noted on CXR Incidental finding but she is a smoker Discussed CT scan--she isn't sure Will repeat CXR in 1 month--proceed with CT then if any concerning findings

## 2017-06-18 ENCOUNTER — Encounter: Payer: Medicare HMO | Admitting: Family Medicine

## 2017-06-19 ENCOUNTER — Encounter: Payer: Medicare HMO | Admitting: Neurology

## 2017-06-19 ENCOUNTER — Telehealth: Payer: Self-pay | Admitting: *Deleted

## 2017-06-19 NOTE — Telephone Encounter (Signed)
No showed NCV/EMG appt.

## 2017-06-25 ENCOUNTER — Encounter: Payer: Self-pay | Admitting: Neurology

## 2017-06-27 DIAGNOSIS — F3131 Bipolar disorder, current episode depressed, mild: Secondary | ICD-10-CM | POA: Diagnosis not present

## 2017-06-27 DIAGNOSIS — R69 Illness, unspecified: Secondary | ICD-10-CM | POA: Diagnosis not present

## 2017-07-04 ENCOUNTER — Telehealth: Payer: Self-pay | Admitting: *Deleted

## 2017-07-04 NOTE — Telephone Encounter (Signed)
Spiriva approved effective 03/24/2017 through 03/25/2018.  [place] notified of approval via fax.  Will add back to medication list.

## 2017-07-04 NOTE — Telephone Encounter (Signed)
Received fax from [facility] requesting PA for Spiriva HandiHaler.  PA completed on CoverMyMeds.  Awaiting on decision.  Can take 72 hours for a response.  We do not have Spiriva on [facility]'s current medication list.  Looks like it was taken off because it was not covered by insurance.  Will await decision.

## 2017-07-05 ENCOUNTER — Encounter: Payer: Medicare HMO | Admitting: Family Medicine

## 2017-07-05 ENCOUNTER — Telehealth: Payer: Self-pay

## 2017-07-05 NOTE — Telephone Encounter (Signed)
Pt needs appt to diagnose issue and determine appropriate treatment.

## 2017-07-05 NOTE — Telephone Encounter (Addendum)
[name] with [facility] left v/m requesting refill for clobetasol cream; [facility] has not filled before and does not know strength . I do not see on current or hx med list.Please advise. Unable to reach pt for further info. I spoke with pt and she cannot remember the doctor that prescribed clobetasol cream; pt uses for itching due to allergies and working outside. Pt is not sure what strength the clobetasol cream was. Pt does not have rash or redness but has itching on legs and arms. Pt has tried OTC meds that do not help.Please advise. [facility].

## 2017-07-05 NOTE — Telephone Encounter (Signed)
Left message for [name] that she will need to be seen to diagnose issue and make sure we are doing appropriate treatment per Dr. Diona Browner.  I ask that she call the office back to schedule an appointment.

## 2017-07-11 DIAGNOSIS — R69 Illness, unspecified: Secondary | ICD-10-CM | POA: Diagnosis not present

## 2017-07-11 DIAGNOSIS — F3131 Bipolar disorder, current episode depressed, mild: Secondary | ICD-10-CM | POA: Diagnosis not present

## 2017-07-15 ENCOUNTER — Other Ambulatory Visit: Payer: Self-pay | Admitting: Internal Medicine

## 2017-07-15 ENCOUNTER — Ambulatory Visit (INDEPENDENT_AMBULATORY_CARE_PROVIDER_SITE_OTHER)
Admission: RE | Admit: 2017-07-15 | Discharge: 2017-07-15 | Disposition: A | Discharge status: Home / Self Care | Payer: Medicare HMO | Source: Ambulatory Visit | Attending: Internal Medicine | Admitting: Internal Medicine

## 2017-07-15 DIAGNOSIS — R918 Other nonspecific abnormal finding of lung field: Secondary | ICD-10-CM | POA: Diagnosis not present

## 2017-07-15 DIAGNOSIS — R911 Solitary pulmonary nodule: Secondary | ICD-10-CM

## 2017-07-22 ENCOUNTER — Ambulatory Visit (INDEPENDENT_AMBULATORY_CARE_PROVIDER_SITE_OTHER)
Admission: RE | Admit: 2017-07-22 | Discharge: 2017-07-22 | Disposition: A | Discharge status: Home / Self Care | Payer: Medicare HMO | Source: Ambulatory Visit | Attending: Internal Medicine | Admitting: Internal Medicine

## 2017-07-22 DIAGNOSIS — R911 Solitary pulmonary nodule: Secondary | ICD-10-CM | POA: Diagnosis not present

## 2017-07-22 DIAGNOSIS — R918 Other nonspecific abnormal finding of lung field: Secondary | ICD-10-CM | POA: Diagnosis not present

## 2017-07-22 MED ORDER — IOPAMIDOL (ISOVUE-300) INJECTION 61%
80.0000 mL | Freq: Once | INTRAVENOUS | Status: AC | PRN
  Administered 2017-07-22: 80 mL via INTRAVENOUS

## 2017-07-24 ENCOUNTER — Other Ambulatory Visit: Payer: Self-pay | Admitting: Internal Medicine

## 2017-07-24 DIAGNOSIS — R911 Solitary pulmonary nodule: Secondary | ICD-10-CM

## 2017-07-25 DIAGNOSIS — R69 Illness, unspecified: Secondary | ICD-10-CM | POA: Diagnosis not present

## 2017-07-25 NOTE — H&P (View-Only) (Signed)
[facility] Consultation      Assessment and Plan:  Left upper lobe cavitary lung nodule. -We will plan tentatively for ENB bronchoscopy. - Follow-up in 1 week after procedure. - Patient also has a history of TB exposure, will check TB QuantiFERON as well as serology.  Cirrhosis with ongoing alcohol abuse. -Discussed that this will complicate any potential diagnosis or treatment. -We will check CBC with platelets and INR.  COPD with ongoing tobacco abuse. - Not really considering quitting at this time.  Spent 3 minutes in discussion. - Patient has minimal dyspnea, no need for inhaler at this time.  Will perform PFT at a later time.  Orders Placed This Encounter  Procedures  . QuantiFERON-TB Gold Plus  . Angiotensin converting enzyme  . ANCA screen with reflex titer  . CBC with Differential/Platelet  . INR/PT   Return in about 1 week (around 08/02/2017).1 week After bronchoscopy   Date: 07/26/2017  MRN# [id number] [name] [birth year]    [name] is a 62 y.o. old female seen in consultation for chief complaint of:    Chief Complaint  Patient presents with  . Consult    ref by Silvio Pate: cough due to smoking: denies any other symptoms    HPI:  She fell out of bed a few months ago, she often tosses and turns in her sleep. After the fall she was having pain under her left breast. She waited 3 weeks and the pain did not go away, she was did ice packs, heating pads, she tried [employer]. Those things helped minimally so she went to see the doctor after 3 weeks. First he had a CXR, then had a followup CT chest.  The pain is gone.  She is smoking half ppd, she has quit several times int he past, the longest quit time was 3 months which was over 10 years ago.  She notes her breathing is ok, she has occasional cough, she is limited by breathing and does not get dyspnea with routine activities. No foreign travel, she has lived in [place], [place], [place], [place], [place],  [place], [place], never in [place].  Her father had bladder cancer and COPD, she has never had cancer.  She has a history of cirrhosis, she used to drink a case of beer and a fifth of bourbon per day, she continues to drink and goes on occasional "4 day runs" where she does not nothing but drink.   She had a friend who had AIDS and TB and died at home, and the patient cared for her.   Imaging personally reviewed, CT chest 07/22/17; LUL cavitary lesion; emphysema. No significant mediastinal lymphadenopathy.    PMHX:   Past Medical History:  Diagnosis Date  . Acquired hypothyroidism    thyroid goiter s/p thyroidectomy  . Bipolar 1 disorder ([place]) 1990s  . Chronic hepatitis C (HCC)    genotype 1a  . Compensated HCV cirrhosis ([place])   . Complication of anesthesia    states 15 yrs ago, hard time waking up, states cant breathe when coming out of anesthesia  . Depression    states seen weekly at [facility]  . GERD (gastroesophageal reflux disease)    Hpylori + and treated (2012)  . History of alcohol abuse   . Insomnia   . Migraines   . Portal hypertension ([place])   . Portal hypertensive gastropathy ([place]) 2013  . Smoker   . Varicose veins    Surgical Hx:  Past Surgical History:  Procedure Laterality Date  . APPENDECTOMY    . DIAGNOSTIC LAPAROSCOPY     15 yrs ago  . ESOPHAGOGASTRODUODENOSCOPY  10/2010   nl esophagus, HH, portal hypertensive gastropathy, erosive gastropathy, Hpylori + (Dr. Sydnee Levans)  . foot surgery Bilateral 2017   Callus removal  . left foot surgery  1990s  . LIVER BIOPSY    . THYROIDECTOMY  05/17/2011   Procedure: TOTAL THYROIDECTOMY;  Surgeon: Earnstine Regal, MD, benign goiter per path report   Family Hx:  Family History  Problem Relation Age of Onset  . Heart disease Mother        pacemaker  . Cancer Father        bladder/melanoma  . Diabetes Father   . Hypertension Father   . Breast cancer Neg Hx    Social Hx:   Social History   Tobacco  Use  . Smoking status: Current Some Day Smoker    Packs/day: 0.50    Years: 40.00    Pack years: 20.00    Types: Cigarettes  . Smokeless tobacco: Former Systems developer    Types: Chew  . Tobacco comment: decreasing use 05/30/2011  Substance Use Topics  . Alcohol use: No  . Drug use: No    Comment: cocaine, lsd, marijuana, heroin  in 20's   Medication:    Current Outpatient Medications:  .  albuterol (PROVENTIL HFA;VENTOLIN HFA) 108 (90 Base) MCG/ACT inhaler, Inhale 2 puffs into the lungs every 6 (six) hours as needed for wheezing or shortness of breath., Disp: 1 Inhaler, Rfl: 2 .  furosemide (LASIX) 20 MG tablet, Take 40 mg by mouth daily. , Disp: , Rfl:  .  lamoTRIgine (LAMICTAL) 200 MG tablet, Take 200 mg by mouth daily., Disp: , Rfl:  .  LATUDA 40 MG TABS tablet, Take 40 mg by mouth daily with breakfast. , Disp: , Rfl:  .  lidocaine-prilocaine (EMLA) cream, Apply 1 application topically as needed. Use 1 hour before appointment, Disp: 30 g, Rfl: 2 .  QUEtiapine (SEROQUEL) 100 MG tablet, Take 100 mg by mouth every morning. , Disp: , Rfl:  .  QUEtiapine (SEROQUEL) 300 MG tablet, Take 300 mg by mouth at bedtime. , Disp: , Rfl:  .  spironolactone (ALDACTONE) 25 MG tablet, Take 100 mg by mouth once. , Disp: , Rfl:  .  SYNTHROID 88 MCG tablet, Take 1 tablet (88 mcg total) by mouth daily., Disp: 90 tablet, Rfl: 0 .  tiotropium (SPIRIVA) 18 MCG inhalation capsule, Place 18 mcg into inhaler and inhale daily., Disp: , Rfl:  .  traZODone (DESYREL) 100 MG tablet, Take 100 mg by mouth at bedtime as needed. , Disp: , Rfl:    Allergies:  Demerol; Penicillins; Aspirin; Oxycodone hcl; and Oxycodone-acetaminophen  Review of Systems: Gen:  Denies  fever, sweats, chills HEENT: Denies blurred vision, double vision. bleeds, sore throat Cvc:  No dizziness, chest pain. Resp:   Denies cough or sputum production, shortness of breath Gi: Denies swallowing difficulty, stomach pain. Gu:  Denies bladder incontinence,  burning urine Ext:   No Joint pain, stiffness. Skin: No skin rash,  hives  Endoc:  No polyuria, polydipsia. Psych: No depression, insomnia. Other:  All other systems were reviewed with the patient and were negative other that what is mentioned in the HPI.   Physical Examination:   VS: BP 106/60 (BP Location: Left Arm, Cuff Size: Normal)   Pulse 77   Ht 5' 2.5" (1.588 m)   Wt 156 lb (70.8 kg)  SpO2 94%   BMI 28.08 kg/m   General Appearance: No distress  Neuro:without focal findings,  speech normal,  HEENT: PERRLA, EOM intact.   Pulmonary: normal breath sounds, No wheezing.  CardiovascularNormal S1,S2.  No m/r/g.   Abdomen: Benign, Soft, non-tender. Renal:  No costovertebral tenderness  GU:  No performed at this time. Endoc: No evident thyromegaly, no signs of acromegaly. Skin:   warm, no rashes, no ecchymosis  Extremities: normal, no cyanosis, clubbing.  Other findings:    LABORATORY PANEL:   CBC No results for input(s): WBC, HGB, HCT, PLT in the last 168 hours. ------------------------------------------------------------------------------------------------------------------  Chemistries  No results for input(s): NA, K, CL, CO2, GLUCOSE, BUN, CREATININE, CALCIUM, MG, AST, ALT, ALKPHOS, BILITOT in the last 168 hours.  Invalid input(s): GFRCGP ------------------------------------------------------------------------------------------------------------------  Cardiac Enzymes No results for input(s): TROPONINI in the last 168 hours. ------------------------------------------------------------  RADIOLOGY:  No results found.     Thank  you for the consultation and for allowing [facility] to assist in the care of your patient. Our recommendations are noted above.  Please contact us if we can be of further service.   Marda Stalker, MD.  Board Certified in Internal Medicine, Pulmonary Medicine, [place], and Sleep Medicine.    [facility] Office Number: [phone]  Patricia Pesa, M.D.  Merton Border, M.D  07/26/2017

## 2017-07-25 NOTE — Progress Notes (Signed)
[facility] Consultation      Assessment and Plan:  Left upper lobe cavitary lung nodule. -We will plan tentatively for ENB bronchoscopy. - Follow-up in 1 week after procedure. - Patient also has a history of TB exposure, will check TB QuantiFERON as well as serology.  Cirrhosis with ongoing alcohol abuse. -Discussed that this will complicate any potential diagnosis or treatment. -We will check CBC with platelets and INR.  COPD with ongoing tobacco abuse. - Not really considering quitting at this time.  Spent 3 minutes in discussion. - Patient has minimal dyspnea, no need for inhaler at this time.  Will perform PFT at a later time.  Orders Placed This Encounter  Procedures  . QuantiFERON-TB Gold Plus  . Angiotensin converting enzyme  . ANCA screen with reflex titer  . CBC with Differential/Platelet  . INR/PT   Return in about 1 week (around 08/02/2017).1 week After bronchoscopy   Date: 07/26/2017  MRN# [id number] [name] Oct 18, 1955    [name] is a 62 y.o. old female seen in consultation for chief complaint of:    Chief Complaint  Patient presents with  . Consult    ref by Silvio Pate: cough due to smoking: denies any other symptoms    HPI:  She fell out of bed a few months ago, she often tosses and turns in her sleep. After the fall she was having pain under her left breast. She waited 3 weeks and the pain did not go away, she was did ice packs, heating pads, she tried [employer]. Those things helped minimally so she went to see the doctor after 3 weeks. First he had a CXR, then had a followup CT chest.  The pain is gone.  She is smoking half ppd, she has quit several times int he past, the longest quit time was 3 months which was over 10 years ago.  She notes her breathing is ok, she has occasional cough, she is limited by breathing and does not get dyspnea with routine activities. No foreign travel, she has lived in [place], [place], [place], [place], [place],  [place], [place], never in [place].  Her father had bladder cancer and COPD, she has never had cancer.  She has a history of cirrhosis, she used to drink a case of beer and a fifth of bourbon per day, she continues to drink and goes on occasional "4 day runs" where she does not nothing but drink.   She had a friend who had AIDS and TB and died at home, and the patient cared for her.   Imaging personally reviewed, CT chest 07/22/17; LUL cavitary lesion; emphysema. No significant mediastinal lymphadenopathy.    PMHX:   Past Medical History:  Diagnosis Date  . Acquired hypothyroidism    thyroid goiter s/p thyroidectomy  . Bipolar 1 disorder ([place]) 1990s  . Chronic hepatitis C (HCC)    genotype 1a  . Compensated HCV cirrhosis ([place])   . Complication of anesthesia    states 15 yrs ago, hard time waking up, states cant breathe when coming out of anesthesia  . Depression    states seen weekly at [facility]  . GERD (gastroesophageal reflux disease)    Hpylori + and treated (2012)  . History of alcohol abuse   . Insomnia   . Migraines   . Portal hypertension ([place])   . Portal hypertensive gastropathy ([place]) 2013  . Smoker   . Varicose veins    Surgical Hx:  Past Surgical History:  Procedure Laterality Date  . APPENDECTOMY    . DIAGNOSTIC LAPAROSCOPY     15 yrs ago  . ESOPHAGOGASTRODUODENOSCOPY  10/2010   nl esophagus, HH, portal hypertensive gastropathy, erosive gastropathy, Hpylori + (Dr. Sydnee Levans)  . foot surgery Bilateral 2017   Callus removal  . left foot surgery  1990s  . LIVER BIOPSY    . THYROIDECTOMY  05/17/2011   Procedure: TOTAL THYROIDECTOMY;  Surgeon: Earnstine Regal, MD, benign goiter per path report   Family Hx:  Family History  Problem Relation Age of Onset  . Heart disease Mother        pacemaker  . Cancer Father        bladder/melanoma  . Diabetes Father   . Hypertension Father   . Breast cancer Neg Hx    Social Hx:   Social History   Tobacco  Use  . Smoking status: Current Some Day Smoker    Packs/day: 0.50    Years: 40.00    Pack years: 20.00    Types: Cigarettes  . Smokeless tobacco: Former Systems developer    Types: Chew  . Tobacco comment: decreasing use 05/30/2011  Substance Use Topics  . Alcohol use: No  . Drug use: No    Comment: cocaine, lsd, marijuana, heroin  in 20's   Medication:    Current Outpatient Medications:  .  albuterol (PROVENTIL HFA;VENTOLIN HFA) 108 (90 Base) MCG/ACT inhaler, Inhale 2 puffs into the lungs every 6 (six) hours as needed for wheezing or shortness of breath., Disp: 1 Inhaler, Rfl: 2 .  furosemide (LASIX) 20 MG tablet, Take 40 mg by mouth daily. , Disp: , Rfl:  .  lamoTRIgine (LAMICTAL) 200 MG tablet, Take 200 mg by mouth daily., Disp: , Rfl:  .  LATUDA 40 MG TABS tablet, Take 40 mg by mouth daily with breakfast. , Disp: , Rfl:  .  lidocaine-prilocaine (EMLA) cream, Apply 1 application topically as needed. Use 1 hour before appointment, Disp: 30 g, Rfl: 2 .  QUEtiapine (SEROQUEL) 100 MG tablet, Take 100 mg by mouth every morning. , Disp: , Rfl:  .  QUEtiapine (SEROQUEL) 300 MG tablet, Take 300 mg by mouth at bedtime. , Disp: , Rfl:  .  spironolactone (ALDACTONE) 25 MG tablet, Take 100 mg by mouth once. , Disp: , Rfl:  .  SYNTHROID 88 MCG tablet, Take 1 tablet (88 mcg total) by mouth daily., Disp: 90 tablet, Rfl: 0 .  tiotropium (SPIRIVA) 18 MCG inhalation capsule, Place 18 mcg into inhaler and inhale daily., Disp: , Rfl:  .  traZODone (DESYREL) 100 MG tablet, Take 100 mg by mouth at bedtime as needed. , Disp: , Rfl:    Allergies:  Demerol; Penicillins; Aspirin; Oxycodone hcl; and Oxycodone-acetaminophen  Review of Systems: Gen:  Denies  fever, sweats, chills HEENT: Denies blurred vision, double vision. bleeds, sore throat Cvc:  No dizziness, chest pain. Resp:   Denies cough or sputum production, shortness of breath Gi: Denies swallowing difficulty, stomach pain. Gu:  Denies bladder incontinence,  burning urine Ext:   No Joint pain, stiffness. Skin: No skin rash,  hives  Endoc:  No polyuria, polydipsia. Psych: No depression, insomnia. Other:  All other systems were reviewed with the patient and were negative other that what is mentioned in the HPI.   Physical Examination:   VS: BP 106/60 (BP Location: Left Arm, Cuff Size: Normal)   Pulse 77   Ht 5' 2.5" (1.588 m)   Wt 156 lb (70.8 kg)  SpO2 94%   BMI 28.08 kg/m   General Appearance: No distress  Neuro:without focal findings,  speech normal,  HEENT: PERRLA, EOM intact.   Pulmonary: normal breath sounds, No wheezing.  CardiovascularNormal S1,S2.  No m/r/g.   Abdomen: Benign, Soft, non-tender. Renal:  No costovertebral tenderness  GU:  No performed at this time. Endoc: No evident thyromegaly, no signs of acromegaly. Skin:   warm, no rashes, no ecchymosis  Extremities: normal, no cyanosis, clubbing.  Other findings:    LABORATORY PANEL:   CBC No results for input(s): WBC, HGB, HCT, PLT in the last 168 hours. ------------------------------------------------------------------------------------------------------------------  Chemistries  No results for input(s): NA, K, CL, CO2, GLUCOSE, BUN, CREATININE, CALCIUM, MG, AST, ALT, ALKPHOS, BILITOT in the last 168 hours.  Invalid input(s): GFRCGP ------------------------------------------------------------------------------------------------------------------  Cardiac Enzymes No results for input(s): TROPONINI in the last 168 hours. ------------------------------------------------------------  RADIOLOGY:  No results found.     Thank  you for the consultation and for allowing [facility] to assist in the care of your patient. Our recommendations are noted above.  Please contact us if we can be of further service.   Marda Stalker, MD.  Board Certified in Internal Medicine, Pulmonary Medicine, [place], and Sleep Medicine.    [facility] Office Number: [phone]  Patricia Pesa, M.D.  Merton Border, M.D  07/26/2017

## 2017-07-26 ENCOUNTER — Encounter: Payer: Self-pay | Admitting: Internal Medicine

## 2017-07-26 ENCOUNTER — Ambulatory Visit (INDEPENDENT_AMBULATORY_CARE_PROVIDER_SITE_OTHER): Payer: Medicare HMO | Admitting: Podiatry

## 2017-07-26 ENCOUNTER — Ambulatory Visit (INDEPENDENT_AMBULATORY_CARE_PROVIDER_SITE_OTHER): Payer: Medicare HMO | Admitting: Internal Medicine

## 2017-07-26 ENCOUNTER — Ambulatory Visit: Payer: Medicare HMO | Admitting: Podiatry

## 2017-07-26 ENCOUNTER — Encounter: Payer: Self-pay | Admitting: Podiatry

## 2017-07-26 VITALS — BP 106/60 | HR 77 | Ht 62.5 in | Wt 156.0 lb

## 2017-07-26 DIAGNOSIS — R911 Solitary pulmonary nodule: Secondary | ICD-10-CM | POA: Diagnosis not present

## 2017-07-26 DIAGNOSIS — Q828 Other specified congenital malformations of skin: Secondary | ICD-10-CM | POA: Diagnosis not present

## 2017-07-26 DIAGNOSIS — K7030 Alcoholic cirrhosis of liver without ascites: Secondary | ICD-10-CM | POA: Diagnosis not present

## 2017-07-26 DIAGNOSIS — F17210 Nicotine dependence, cigarettes, uncomplicated: Secondary | ICD-10-CM | POA: Diagnosis not present

## 2017-07-26 DIAGNOSIS — R69 Illness, unspecified: Secondary | ICD-10-CM | POA: Diagnosis not present

## 2017-07-26 NOTE — Patient Instructions (Signed)
Will schedule biopsy and then see you back a week after the biopsy.  You can reduce the chance of complication by not smoking.

## 2017-07-29 ENCOUNTER — Telehealth: Payer: Self-pay | Admitting: Internal Medicine

## 2017-07-29 ENCOUNTER — Other Ambulatory Visit: Payer: Self-pay | Admitting: Family Medicine

## 2017-07-29 DIAGNOSIS — Z1231 Encounter for screening mammogram for malignant neoplasm of breast: Secondary | ICD-10-CM

## 2017-07-29 NOTE — Telephone Encounter (Signed)
Patient wants to know where to have ordered labs drawn .  Please call.

## 2017-07-29 NOTE — Progress Notes (Signed)
Subjective:   Patient ID: [name], female   DOB: 62 y.o.   MRN: [id number]   HPI Patient presents with painful lesions on both feet that make it hard for her to wear shoe gear comfortably   ROS      Objective:  Physical Exam  Neurovascular status intact with keratotic lesion noted bilateral plantar feet     Assessment:  Extremely painful lesions plantar     Plan:  Discussed lesions in the skin structures she has which creates thickened type skin.  Today I attempted debridement but she was in a lot of pain and I did debride several of them and then I put cream on the remaining lesions to try to make it more palatable for me to do this and she left while we are waiting for the cream to work.  We encouraged her to come back for treatment

## 2017-07-29 NOTE — Telephone Encounter (Signed)
Pt informed where to have lab work done. Nothing further needed.

## 2017-07-30 ENCOUNTER — Other Ambulatory Visit
Admission: RE | Admit: 2017-07-30 | Discharge: 2017-07-30 | Disposition: A | Discharge status: Home / Self Care | Payer: Medicare HMO | Source: Ambulatory Visit | Attending: Internal Medicine | Admitting: Internal Medicine

## 2017-07-30 DIAGNOSIS — R911 Solitary pulmonary nodule: Secondary | ICD-10-CM | POA: Diagnosis present

## 2017-07-30 LAB — CBC WITH DIFFERENTIAL/PLATELET
Basophils Absolute: 0 10*3/uL (ref 0–0.1)
Basophils Relative: 1 %
EOS ABS: 0.1 10*3/uL (ref 0–0.7)
Eosinophils Relative: 2 %
HEMATOCRIT: 40.2 % (ref 35.0–47.0)
HEMOGLOBIN: 13.7 g/dL (ref 12.0–16.0)
LYMPHS PCT: 15 %
Lymphs Abs: 0.8 10*3/uL — ABNORMAL LOW (ref 1.0–3.6)
MCH: 32.1 pg (ref 26.0–34.0)
MCHC: 34.1 g/dL (ref 32.0–36.0)
MCV: 94.2 fL (ref 80.0–100.0)
MONOS PCT: 18 %
Monocytes Absolute: 1 10*3/uL — ABNORMAL HIGH (ref 0.2–0.9)
NEUTROS ABS: 3.6 10*3/uL (ref 1.4–6.5)
NEUTROS PCT: 64 %
Platelets: 83 10*3/uL — ABNORMAL LOW (ref 150–440)
RBC: 4.27 MIL/uL (ref 3.80–5.20)
RDW: 13.6 % (ref 11.5–14.5)
WBC: 5.6 10*3/uL (ref 3.6–11.0)

## 2017-07-30 LAB — PROTIME-INR
INR: 1.02
PROTHROMBIN TIME: 13.3 s (ref 11.4–15.2)

## 2017-07-30 NOTE — Addendum Note (Signed)
Addended by: Santiago Bur on: 07/30/2017 10:55 AM   Modules accepted: Orders

## 2017-07-31 LAB — ANGIOTENSIN CONVERTING ENZYME: ANGIOTENSIN-CONVERTING ENZYME: 108 U/L — AB (ref 14–82)

## 2017-08-01 DIAGNOSIS — R69 Illness, unspecified: Secondary | ICD-10-CM | POA: Diagnosis not present

## 2017-08-01 LAB — ANCA TITERS: Atypical P-ANCA titer: <1:20 {titer}

## 2017-08-02 ENCOUNTER — Telehealth: Payer: Self-pay | Admitting: Podiatry

## 2017-08-02 MED ORDER — LIDOCAINE-PRILOCAINE 2.5-2.5 % EX CREA: 1.0000 "application " | TOPICAL_CREAM | CUTANEOUS | 2 refills | Status: DC | PRN

## 2017-08-02 NOTE — Telephone Encounter (Signed)
Dr. Paulla Dolly ordered refill as previously. I informed pt of the refills.

## 2017-08-02 NOTE — Telephone Encounter (Signed)
I saw Dr. Paulla Dolly a week ago and I wanted to know if he could send in a prescription for lidocaine to [facility] with at least a couple of refills on it. Thank you.

## 2017-08-02 NOTE — Addendum Note (Signed)
Addended by: Harriett Sine D on: 08/02/2017 01:28 PM   Modules accepted: Orders

## 2017-08-04 LAB — QUANTIFERON-TB GOLD PLUS (RQFGPL)
QUANTIFERON TB2 AG VALUE: 0.06 [IU]/mL
QuantiFERON Mitogen Value: 9.73 IU/mL
QuantiFERON Nil Value: 0.07 IU/mL
QuantiFERON TB1 Ag Value: 0.06 IU/mL

## 2017-08-04 LAB — QUANTIFERON-TB GOLD PLUS: QUANTIFERON-TB GOLD PLUS: NEGATIVE

## 2017-08-05 ENCOUNTER — Other Ambulatory Visit: Payer: Self-pay | Admitting: *Deleted

## 2017-08-05 ENCOUNTER — Telehealth: Payer: Self-pay | Admitting: Internal Medicine

## 2017-08-05 DIAGNOSIS — R911 Solitary pulmonary nodule: Secondary | ICD-10-CM

## 2017-08-05 NOTE — Progress Notes (Signed)
Order placed per DK for the CT Super D for the ENB.

## 2017-08-05 NOTE — Telephone Encounter (Signed)
Pt informed of bronch and PAT dates and times.  PROVIDER: Kasa  PROCEDURE: ENB DATE: 08/15/17 TIME: 1pm. (pt to arrive at 11am for CT Super D DX: lung nodule.   CT Super D

## 2017-08-05 NOTE — Telephone Encounter (Signed)
Pt states she is returning our call  Please call back

## 2017-08-06 NOTE — Telephone Encounter (Signed)
[employer] and spoke with [place]. Procedure code [phone] is a valid and billable code which does not require PA.  Call Ref # [id number]. Rhonda J Cobb

## 2017-08-07 ENCOUNTER — Telehealth: Payer: Self-pay | Admitting: *Deleted

## 2017-08-07 DIAGNOSIS — Z13820 Encounter for screening for osteoporosis: Secondary | ICD-10-CM | POA: Diagnosis not present

## 2017-08-07 DIAGNOSIS — M8588 Other specified disorders of bone density and structure, other site: Secondary | ICD-10-CM | POA: Diagnosis not present

## 2017-08-07 DIAGNOSIS — Z780 Asymptomatic menopausal state: Secondary | ICD-10-CM | POA: Diagnosis not present

## 2017-08-07 DIAGNOSIS — M85852 Other specified disorders of bone density and structure, left thigh: Secondary | ICD-10-CM | POA: Diagnosis not present

## 2017-08-07 NOTE — Telephone Encounter (Signed)
PA started for the EMLA Cream with CoverMyMeds.

## 2017-08-08 ENCOUNTER — Other Ambulatory Visit: Payer: Self-pay

## 2017-08-08 ENCOUNTER — Telehealth: Payer: Self-pay | Admitting: Internal Medicine

## 2017-08-08 ENCOUNTER — Encounter
Admission: RE | Admit: 2017-08-08 | Discharge: 2017-08-08 | Disposition: A | Discharge status: Home / Self Care | Payer: Medicare HMO | Source: Ambulatory Visit | Attending: Internal Medicine | Admitting: Internal Medicine

## 2017-08-08 DIAGNOSIS — Z01812 Encounter for preprocedural laboratory examination: Secondary | ICD-10-CM | POA: Diagnosis not present

## 2017-08-08 DIAGNOSIS — R69 Illness, unspecified: Secondary | ICD-10-CM | POA: Diagnosis not present

## 2017-08-08 HISTORY — DX: Transient cerebral ischemic attack, unspecified: G45.9

## 2017-08-08 HISTORY — DX: Chronic obstructive pulmonary disease, unspecified: J44.9

## 2017-08-08 HISTORY — DX: Pneumonia, unspecified organism: J18.9

## 2017-08-08 LAB — HM DEXA SCAN

## 2017-08-08 LAB — COMPREHENSIVE METABOLIC PANEL
ALBUMIN: 3.8 g/dL (ref 3.5–5.0)
ALT: 20 U/L (ref 14–54)
AST: 29 U/L (ref 15–41)
Alkaline Phosphatase: 102 U/L (ref 38–126)
Anion gap: 8 (ref 5–15)
BUN: 20 mg/dL (ref 6–20)
CHLORIDE: 102 mmol/L (ref 101–111)
CO2: 25 mmol/L (ref 22–32)
CREATININE: 0.92 mg/dL (ref 0.44–1.00)
Calcium: 8.4 mg/dL — ABNORMAL LOW (ref 8.9–10.3)
GFR calc Af Amer: >60 mL/min (ref >60)
GFR calc non Af Amer: >60 mL/min (ref >60)
GLUCOSE: 95 mg/dL (ref 65–99)
POTASSIUM: 4.1 mmol/L (ref 3.5–5.1)
Sodium: 135 mmol/L (ref 135–145)
Total Bilirubin: 1.8 mg/dL — ABNORMAL HIGH (ref 0.3–1.2)
Total Protein: 7.8 g/dL (ref 6.5–8.1)

## 2017-08-08 NOTE — Patient Instructions (Signed)
Your procedure is scheduled on: Thursday 08/15/17 Report to [facility]. To find out your arrival time please call [phone] between 1PM - 3PM on Wednesday 08/14/17.  Remember: Instructions that are not followed completely may result in serious medical risk, up to and including death, or upon the discretion of your surgeon and anesthesiologist your surgery may need to be rescheduled.     _X__ 1. Do not eat food after midnight the night before your procedure.                 No gum chewing or hard candies. You may drink clear liquids up to 2 hours                 before you are scheduled to arrive for your surgery- DO not drink clear                 liquids within 2 hours of the start of your surgery.                 Clear Liquids include:  water, apple juice without pulp, clear carbohydrate                 drink such as Clearfast or Gatorade, Black Coffee or Tea (Do not add                 anything to coffee or tea).  __X__2.  On the morning of surgery brush your teeth with toothpaste and water, you                 may rinse your mouth with mouthwash if you wish.  Do not swallow any              toothpaste of mouthwash.     _X__ 3.  No Alcohol for 24 hours before or after surgery.   _X__ 4.  Do Not Smoke or use e-cigarettes For 24 Hours Prior to Your Surgery.                 Do not use any chewable tobacco products for at least 6 hours prior to                 surgery.  ____  5.  Bring all medications with you on the day of surgery if instructed.   __X__  6.  Notify your doctor if there is any change in your medical condition      (cold, fever, infections).     Do not wear jewelry, make-up, hairpins, clips or nail polish. Do not wear lotions, powders, or perfumes.  Do not shave 48 hours prior to surgery. Men may shave face and neck. Do not bring valuables to the hospital.    [facility] is not responsible for any belongings or  valuables.  Contacts, dentures/partials or body piercings may not be worn into surgery. Bring a case for your contacts, glasses or hearing aids, a denture cup will be supplied. Leave your suitcase in the car. After surgery it may be brought to your room. For patients admitted to the hospital, discharge time is determined by your treatment team.   Patients discharged the day of surgery will not be allowed to drive home.   Please read over the following fact sheets that you were given:   MRSA Information  __X__ Take these medicines the morning of surgery with A SIP OF WATER:  1. Synthroid  2.   3.   4.  5.  6.  ____ Fleet Enema (as directed)   ____ Use CHG Soap/SAGE wipes as directed  __X__ Use inhalers on the day of surgery  SPIRIVA AND ALBUTEROL. BRING ALBUTEROL WITH YOU  ____ Stop metformin/Janumet/Farxiga 2 days prior to surgery    ____ Take 1/2 of usual insulin dose the night before surgery. No insulin the morning          of surgery.   ____ Stop Blood Thinners Coumadin/Plavix/Xarelto/Pleta/Pradaxa/Eliquis/Effient/Aspirin  on   Or contact your Surgeon, Cardiologist or Medical Doctor regarding  ability to stop your blood thinners  __X__ Stop Anti-inflammatories 7 days before surgery such as Advil, Ibuprofen, Motrin,  BC or Goodies Powder, Naprosyn, Naproxen, Aleve, Aspirin    __X__ Stop all herbal supplements, fish oil or vitamin E until after surgery.    ____ Bring C-Pap to the hospital.

## 2017-08-08 NOTE — Telephone Encounter (Signed)
Agree with plan. Thank you

## 2017-08-08 NOTE — Telephone Encounter (Signed)
Nurse states pt has platelet count of 83, they will recheck the morning of surgery, 5/23. Please advise if this is ok.

## 2017-08-08 NOTE — Pre-Procedure Instructions (Addendum)
SPOKE WITH DR P [name] ABOUT PATIENT HISTORY AND CHRONIC LOW PLATELETS. WILL ORDER METC TODAY AND PLATELET COUNT AM OF SURGERY. WILL VERIFY DR Mortimer Fries AWARE. SPOKE WITH SABRINA

## 2017-08-15 ENCOUNTER — Other Ambulatory Visit: Payer: Self-pay

## 2017-08-15 ENCOUNTER — Encounter
Admission: RE | Disposition: A | Discharge status: Home / Self Care | Payer: Self-pay | Source: Ambulatory Visit | Attending: Internal Medicine

## 2017-08-15 ENCOUNTER — Ambulatory Visit
Admission: RE | Admit: 2017-08-15 | Discharge: 2017-08-15 | Disposition: A | Discharge status: Home / Self Care | Payer: Medicare HMO | Source: Ambulatory Visit | Attending: Internal Medicine | Admitting: Internal Medicine

## 2017-08-15 ENCOUNTER — Encounter: Payer: Self-pay | Admitting: *Deleted

## 2017-08-15 ENCOUNTER — Encounter (HOSPITAL_COMMUNITY): Payer: Self-pay

## 2017-08-15 ENCOUNTER — Ambulatory Visit: Payer: Medicare HMO | Admitting: Anesthesiology

## 2017-08-15 ENCOUNTER — Emergency Department (HOSPITAL_COMMUNITY): Payer: Medicare HMO

## 2017-08-15 ENCOUNTER — Emergency Department (HOSPITAL_COMMUNITY)
Admission: EM | Admit: 2017-08-15 | Discharge: 2017-08-15 | Disposition: A | Discharge status: Home / Self Care | Payer: Medicare HMO | Attending: Emergency Medicine | Admitting: Emergency Medicine

## 2017-08-15 ENCOUNTER — Ambulatory Visit: Payer: Medicare HMO

## 2017-08-15 DIAGNOSIS — F1010 Alcohol abuse, uncomplicated: Secondary | ICD-10-CM | POA: Diagnosis not present

## 2017-08-15 DIAGNOSIS — F319 Bipolar disorder, unspecified: Secondary | ICD-10-CM | POA: Diagnosis not present

## 2017-08-15 DIAGNOSIS — F17210 Nicotine dependence, cigarettes, uncomplicated: Secondary | ICD-10-CM | POA: Insufficient documentation

## 2017-08-15 DIAGNOSIS — K766 Portal hypertension: Secondary | ICD-10-CM | POA: Insufficient documentation

## 2017-08-15 DIAGNOSIS — Z8249 Family history of ischemic heart disease and other diseases of the circulatory system: Secondary | ICD-10-CM | POA: Diagnosis not present

## 2017-08-15 DIAGNOSIS — K3189 Other diseases of stomach and duodenum: Secondary | ICD-10-CM | POA: Insufficient documentation

## 2017-08-15 DIAGNOSIS — I6789 Other cerebrovascular disease: Secondary | ICD-10-CM | POA: Diagnosis not present

## 2017-08-15 DIAGNOSIS — Z885 Allergy status to narcotic agent status: Secondary | ICD-10-CM | POA: Insufficient documentation

## 2017-08-15 DIAGNOSIS — Z886 Allergy status to analgesic agent status: Secondary | ICD-10-CM | POA: Diagnosis not present

## 2017-08-15 DIAGNOSIS — B182 Chronic viral hepatitis C: Secondary | ICD-10-CM | POA: Insufficient documentation

## 2017-08-15 DIAGNOSIS — Z8673 Personal history of transient ischemic attack (TIA), and cerebral infarction without residual deficits: Secondary | ICD-10-CM | POA: Insufficient documentation

## 2017-08-15 DIAGNOSIS — J449 Chronic obstructive pulmonary disease, unspecified: Secondary | ICD-10-CM | POA: Diagnosis not present

## 2017-08-15 DIAGNOSIS — Z79899 Other long term (current) drug therapy: Secondary | ICD-10-CM | POA: Insufficient documentation

## 2017-08-15 DIAGNOSIS — R531 Weakness: Secondary | ICD-10-CM | POA: Insufficient documentation

## 2017-08-15 DIAGNOSIS — Z91040 Latex allergy status: Secondary | ICD-10-CM | POA: Insufficient documentation

## 2017-08-15 DIAGNOSIS — R011 Cardiac murmur, unspecified: Secondary | ICD-10-CM | POA: Insufficient documentation

## 2017-08-15 DIAGNOSIS — E039 Hypothyroidism, unspecified: Secondary | ICD-10-CM | POA: Insufficient documentation

## 2017-08-15 DIAGNOSIS — K7030 Alcoholic cirrhosis of liver without ascites: Secondary | ICD-10-CM | POA: Diagnosis not present

## 2017-08-15 DIAGNOSIS — R200 Anesthesia of skin: Secondary | ICD-10-CM | POA: Diagnosis not present

## 2017-08-15 DIAGNOSIS — R9431 Abnormal electrocardiogram [ECG] [EKG]: Secondary | ICD-10-CM | POA: Diagnosis not present

## 2017-08-15 DIAGNOSIS — E890 Postprocedural hypothyroidism: Secondary | ICD-10-CM | POA: Diagnosis not present

## 2017-08-15 DIAGNOSIS — Z880 Allergy status to penicillin: Secondary | ICD-10-CM | POA: Insufficient documentation

## 2017-08-15 DIAGNOSIS — R918 Other nonspecific abnormal finding of lung field: Secondary | ICD-10-CM | POA: Diagnosis not present

## 2017-08-15 DIAGNOSIS — Z888 Allergy status to other drugs, medicaments and biological substances status: Secondary | ICD-10-CM | POA: Diagnosis not present

## 2017-08-15 DIAGNOSIS — R911 Solitary pulmonary nodule: Secondary | ICD-10-CM | POA: Insufficient documentation

## 2017-08-15 DIAGNOSIS — R69 Illness, unspecified: Secondary | ICD-10-CM | POA: Diagnosis not present

## 2017-08-15 HISTORY — PX: ELECTROMAGNETIC NAVIGATION BROCHOSCOPY: SHX5369

## 2017-08-15 LAB — I-STAT TROPONIN, ED: TROPONIN I, POC: 0.01 ng/mL (ref 0.00–0.08)

## 2017-08-15 LAB — COMPREHENSIVE METABOLIC PANEL
ALBUMIN: 3.5 g/dL (ref 3.5–5.0)
ALT: 20 U/L (ref 14–54)
AST: 30 U/L (ref 15–41)
Alkaline Phosphatase: 94 U/L (ref 38–126)
Anion gap: 8 (ref 5–15)
BUN: 14 mg/dL (ref 6–20)
CHLORIDE: 104 mmol/L (ref 101–111)
CO2: 22 mmol/L (ref 22–32)
Calcium: 8.6 mg/dL — ABNORMAL LOW (ref 8.9–10.3)
Creatinine, Ser: 0.98 mg/dL (ref 0.44–1.00)
GFR calc Af Amer: >60 mL/min (ref >60)
GLUCOSE: 157 mg/dL — AB (ref 65–99)
POTASSIUM: 4.3 mmol/L (ref 3.5–5.1)
SODIUM: 134 mmol/L — AB (ref 135–145)
Total Bilirubin: 1.6 mg/dL — ABNORMAL HIGH (ref 0.3–1.2)
Total Protein: 7.2 g/dL (ref 6.5–8.1)

## 2017-08-15 LAB — DIFFERENTIAL
ABS IMMATURE GRANULOCYTES: 0 10*3/uL (ref 0.0–0.1)
BASOS PCT: 1 %
Basophils Absolute: 0 10*3/uL (ref 0.0–0.1)
Eosinophils Absolute: 0 10*3/uL (ref 0.0–0.7)
Eosinophils Relative: 0 %
IMMATURE GRANULOCYTES: 0 %
Lymphocytes Relative: 7 %
Lymphs Abs: 0.5 10*3/uL — ABNORMAL LOW (ref 0.7–4.0)
Monocytes Absolute: 0.2 10*3/uL (ref 0.1–1.0)
Monocytes Relative: 3 %
NEUTROS ABS: 6.8 10*3/uL (ref 1.7–7.7)
Neutrophils Relative %: 89 %

## 2017-08-15 LAB — PLATELET COUNT: Platelets: 91 10*3/uL — ABNORMAL LOW (ref 150–440)

## 2017-08-15 LAB — APTT: aPTT: 31 seconds (ref 24–36)

## 2017-08-15 LAB — CBC
HCT: 40 % (ref 36.0–46.0)
HEMOGLOBIN: 13.7 g/dL (ref 12.0–15.0)
MCH: 31.5 pg (ref 26.0–34.0)
MCHC: 34.3 g/dL (ref 30.0–36.0)
MCV: 92 fL (ref 78.0–100.0)
Platelets: 86 10*3/uL — ABNORMAL LOW (ref 150–400)
RBC: 4.35 MIL/uL (ref 3.87–5.11)
RDW: 12.7 % (ref 11.5–15.5)
WBC: 7.6 10*3/uL (ref 4.0–10.5)

## 2017-08-15 LAB — PROTIME-INR
INR: 1.12
Prothrombin Time: 14.3 seconds (ref 11.4–15.2)

## 2017-08-15 SURGERY — ELECTROMAGNETIC NAVIGATION BRONCHOSCOPY
Anesthesia: General

## 2017-08-15 MED ORDER — PROMETHAZINE HCL 25 MG/ML IJ SOLN: 6.2500 mg | INTRAMUSCULAR | Status: DC | PRN

## 2017-08-15 MED ORDER — FENTANYL CITRATE (PF) 100 MCG/2ML IJ SOLN
INTRAMUSCULAR | Status: DC | PRN
  Administered 2017-08-15: 50 ug via INTRAVENOUS

## 2017-08-15 MED ORDER — FENTANYL CITRATE (PF) 100 MCG/2ML IJ SOLN
25.0000 ug | INTRAMUSCULAR | Status: DC | PRN
  Administered 2017-08-15: 50 ug via INTRAVENOUS

## 2017-08-15 MED ORDER — SODIUM CHLORIDE 0.9 % IV BOLUS
500.0000 mL | Freq: Once | INTRAVENOUS | Status: AC
  Administered 2017-08-15: 500 mL via INTRAVENOUS

## 2017-08-15 MED ORDER — PROPOFOL 10 MG/ML IV BOLUS
INTRAVENOUS | Status: DC | PRN
  Administered 2017-08-15: 120 mg via INTRAVENOUS

## 2017-08-15 MED ORDER — MIDAZOLAM HCL 5 MG/5ML IJ SOLN
INTRAMUSCULAR | Status: DC | PRN
  Administered 2017-08-15: 2 mg via INTRAVENOUS

## 2017-08-15 MED ORDER — SUGAMMADEX SODIUM 200 MG/2ML IV SOLN
INTRAVENOUS | Status: DC | PRN
  Administered 2017-08-15: 150 mg via INTRAVENOUS

## 2017-08-15 MED ORDER — SUCCINYLCHOLINE CHLORIDE 20 MG/ML IJ SOLN
INTRAMUSCULAR | Status: AC
  Filled 2017-08-15: qty 1

## 2017-08-15 MED ORDER — PHENYLEPHRINE HCL 0.25 % NA SOLN
1.0000 | Freq: Four times a day (QID) | NASAL | Status: DC | PRN
  Filled 2017-08-15: qty 15

## 2017-08-15 MED ORDER — LACTATED RINGERS IV SOLN
INTRAVENOUS | Status: DC
  Administered 2017-08-15: 12:00:00 via INTRAVENOUS

## 2017-08-15 MED ORDER — FENTANYL CITRATE (PF) 100 MCG/2ML IJ SOLN
INTRAMUSCULAR | Status: AC
  Filled 2017-08-15: qty 2

## 2017-08-15 MED ORDER — FAMOTIDINE 20 MG PO TABS
20.0000 mg | ORAL_TABLET | Freq: Once | ORAL | Status: AC
  Administered 2017-08-15: 20 mg via ORAL

## 2017-08-15 MED ORDER — PHENYLEPHRINE HCL 10 MG/ML IJ SOLN
INTRAMUSCULAR | Status: DC | PRN
  Administered 2017-08-15: 80 ug via INTRAVENOUS
  Administered 2017-08-15: 40 ug via INTRAVENOUS

## 2017-08-15 MED ORDER — LIDOCAINE HCL (CARDIAC) PF 100 MG/5ML IV SOSY
PREFILLED_SYRINGE | INTRAVENOUS | Status: DC | PRN
  Administered 2017-08-15: 60 mg via INTRAVENOUS

## 2017-08-15 MED ORDER — ONDANSETRON HCL 4 MG/2ML IJ SOLN
INTRAMUSCULAR | Status: AC
  Filled 2017-08-15: qty 2

## 2017-08-15 MED ORDER — SODIUM CHLORIDE 0.9 % IV SOLN: 100.0000 mL/h | INTRAVENOUS | Status: DC

## 2017-08-15 MED ORDER — DEXAMETHASONE SODIUM PHOSPHATE 10 MG/ML IJ SOLN
INTRAMUSCULAR | Status: DC | PRN
  Administered 2017-08-15: 5 mg via INTRAVENOUS

## 2017-08-15 MED ORDER — ROCURONIUM BROMIDE 100 MG/10ML IV SOLN
INTRAVENOUS | Status: DC | PRN
  Administered 2017-08-15: 10 mg via INTRAVENOUS
  Administered 2017-08-15: 5 mg via INTRAVENOUS

## 2017-08-15 MED ORDER — SUCCINYLCHOLINE CHLORIDE 20 MG/ML IJ SOLN
INTRAMUSCULAR | Status: DC | PRN
  Administered 2017-08-15: 100 mg via INTRAVENOUS

## 2017-08-15 MED ORDER — PROPOFOL 10 MG/ML IV BOLUS
INTRAVENOUS | Status: AC
  Filled 2017-08-15: qty 20

## 2017-08-15 MED ORDER — DEXAMETHASONE SODIUM PHOSPHATE 10 MG/ML IJ SOLN
INTRAMUSCULAR | Status: AC
  Filled 2017-08-15: qty 1

## 2017-08-15 MED ORDER — MIDAZOLAM HCL 2 MG/2ML IJ SOLN
INTRAMUSCULAR | Status: AC
  Filled 2017-08-15: qty 2

## 2017-08-15 MED ORDER — ROCURONIUM BROMIDE 50 MG/5ML IV SOLN
INTRAVENOUS | Status: AC
  Filled 2017-08-15: qty 1

## 2017-08-15 MED ORDER — ONDANSETRON HCL 4 MG/2ML IJ SOLN
INTRAMUSCULAR | Status: DC | PRN
  Administered 2017-08-15: 4 mg via INTRAVENOUS

## 2017-08-15 MED ORDER — FAMOTIDINE 20 MG PO TABS
ORAL_TABLET | ORAL | Status: AC
  Filled 2017-08-15: qty 1

## 2017-08-15 NOTE — Anesthesia Post-op Follow-up Note (Signed)
Anesthesia QCDR form completed.        

## 2017-08-15 NOTE — Discharge Instructions (Addendum)
As discussed, today's evaluation has been generally reassuring. Please schedule follow-up appointment with your primary care physician or our neurology colleagues. In the interim, please take aspirin, 325 mg, daily.  Return here for concerning changes in your condition.

## 2017-08-15 NOTE — Anesthesia Postprocedure Evaluation (Signed)
Anesthesia Post Note  Patient: [name]  Procedure(s) Performed: ELECTROMAGNETIC NAVIGATION BRONCHOSCOPY (N/A )  Patient location during evaluation: PACU Anesthesia Type: General Level of consciousness: awake and alert Pain management: pain level controlled Vital Signs Assessment: post-procedure vital signs reviewed and stable Respiratory status: spontaneous breathing and respiratory function stable Cardiovascular status: stable Anesthetic complications: no     Last Vitals:  Vitals:   08/15/17 1503 08/15/17 1513  BP: (!) 92/49 (!) 92/48  Pulse: 74 65  Resp: 20 20  Temp:  36.8 C  SpO2: 99% 97%    Last Pain:  Vitals:   08/15/17 1513  TempSrc:   PainSc: 0-No pain                 Sadia Belfiore K

## 2017-08-15 NOTE — ED Notes (Signed)
Pt stable, ambulatory, states understanding of discharge instructions 

## 2017-08-15 NOTE — Anesthesia Preprocedure Evaluation (Signed)
Anesthesia Evaluation  Patient identified by MRN, date of birth, ID band Patient awake    Reviewed: Allergy & Precautions, H&P , NPO status , Patient's Chart, lab work & pertinent test results, reviewed documented beta blocker date and time   History of Anesthesia Complications (+) PONV and history of anesthetic complications  Airway Mallampati: II  TM Distance: >3 FB Neck ROM: full    Dental  (+) Edentulous Upper, Edentulous Lower, Dental Advidsory Given   Pulmonary neg shortness of breath, COPD,  COPD inhaler, neg recent URI, Current Smoker,           Cardiovascular Exercise Tolerance: Good (-) hypertension(-) angina(-) CAD, (-) Past MI, (-) Cardiac Stents and (-) CABG (-) dysrhythmias + Valvular Problems/Murmurs      Neuro/Psych neg Seizures PSYCHIATRIC DISORDERS Depression Bipolar Disorder TIA   GI/Hepatic GERD  ,(+)     substance abuse (former alcoholic)  , Hepatitis - (s/p treatment), C  Endo/Other  neg diabetesHypothyroidism   Renal/GU negative Renal ROS  negative genitourinary   Musculoskeletal   Abdominal   Peds  Hematology negative hematology ROS (+)   Anesthesia Other Findings Past Medical History: No date: Acquired hypothyroidism     Comment:  thyroid goiter s/p thyroidectomy 1990s: Bipolar 1 disorder (HCC) No date: Chronic hepatitis C (HCC)     Comment:  genotype 1a No date: Compensated HCV cirrhosis ([place]) No date: Complication of anesthesia     Comment:  states 15 yrs ago, hard time waking up, states cant               breathe when coming out of anesthesia No date: Complication of anesthesia     Comment:  per patient heart stopped No date: COPD (chronic obstructive pulmonary disease) (HCC) No date: Depression     Comment:  states [facility] No date: GERD (gastroesophageal reflux disease)     Comment:  Hpylori + and treated (2012) No date: History of alcohol abuse No  date: Insomnia No date: Migraines No date: Pneumonia No date: PONV (postoperative nausea and vomiting)     Comment:  "it all depends on what kind they give me" No date: Portal hypertension ([place]) 2013: Portal hypertensive gastropathy ([place]) No date: Smoker No date: TIA (transient ischemic attack) No date: Varicose veins   Reproductive/Obstetrics negative OB ROS                             Anesthesia Physical Anesthesia Plan  ASA: III  Anesthesia Plan: General   Post-op Pain Management:    Induction: Intravenous  PONV Risk Score and Plan: 3 and Ondansetron and Dexamethasone  Airway Management Planned: Oral ETT  Additional Equipment:   Intra-op Plan:   Post-operative Plan: Extubation in OR  Informed Consent: I have reviewed the patients History and Physical, chart, labs and discussed the procedure including the risks, benefits and alternatives for the proposed anesthesia with the patient or authorized representative who has indicated his/her understanding and acceptance.   Dental Advisory Given  Plan Discussed with: Anesthesiologist, CRNA and Surgeon  Anesthesia Plan Comments:         Anesthesia Quick Evaluation

## 2017-08-15 NOTE — Op Note (Addendum)
Electromagnetic Navigation Bronchoscopy: Indication: lung mass/nodule  Preoperative Diagnosis:lung nodule/mass Post Procedure Diagnosis:lung nodule/mass Consent: Verbal/Written  The Risks and Benefits of the procedure explained to patient/family prior to start of procedure and I have discussed the risk for acute bleeding, increased chance of infection, increased chance of respiratory failure and cardiac arrest and death.  I have also explained to avoid all types of NSAIDs to decrease chance of bleeding, and to avoid food and drinks the midnight prior to procedure.  The procedure consists of a video camera with a light source to be placed and inserted  into the lungs to  look for abnormal tissue and to obtain tissue samples by using needle and biopsy tools.  The patient/family understand the risks and benefits and have agreed to proceed with procedure.   Hand washing performed prior to starting the procedure.   Type of Anesthesia: see Anesthesiology records .   Procedure Performed:  Virtual Bronchoscopy with Multi-planar Image analysis, 3-D reconstruction of coronal, sagittal and multi-planar images for the purposes of planning real-time bronchoscopy using the iLogic Electromagnetic Navigation Bronchoscopy System (superDimension).  Description of Procedure: After obtaining informed consent from the patient, the above sedative and anesthetic measures were carried out, flexible fiberoptic bronchoscope was inserted via Endotracheal tube after patient was intubated by CNA/Anesthesiologist.   The virtual camera was then placed into the central portion of the trachea. The trachea itself was inspected.  The main carina, right and left midstem bronchus and all the segmental and subsegmental airways by virtual bronchoscopy were inspected. The camera was directed to standard registration points at the following centers: main carina, right upper lobe bronchus, right lower lobe bronchus, right middle  lobe bronchus, left upper lobe bronchus, and the left lower lobe bronchus. This data was transferred to the i-Logic ENB system for real-time bronchoscopy.   The scope was then navigated to the LUL for tissue sampling  Specimans Obtained:  Transbronchial Fine Needle Aspirations 21G times:5  Transbronchial Forceps Biopsy times:12  Transbronchial Triple Needle Brush:1  Fluoroscopy:  Fluoroscopy was utilized during the course of this procedure to assure that biopsies were taken in a safe manner under fluoroscopic guidance with spot films required.   Complications:None  Estimated Blood Loss: minimal approx 3 cc  Monitoring:  The patient was monitored with continuous oximetry and received supplemental nasal cannula oxygen throughout the procedure. In addition, serial blood pressure measurements and continuous electrocardiography showed these physiologic parameters to remain tolerable throughout the procedure.   Assessment and Plan/Additional Comments: Follow up Pathology Reports    There was residual minimal bleeding from ETT so I placed scope back into ETT and navigated  to look for source and there was NO active bleeding from airways. I placed EPI with saline into LUL airways As a precaution.  Corrin Parker, M.D.  [facility]  Medical Director [facility] Director [facility] Cardio-Pulmonary Department

## 2017-08-15 NOTE — Transfer of Care (Signed)
Immediate Anesthesia Transfer of Care Note  Patient: [name]  Procedure(s) Performed: ELECTROMAGNETIC NAVIGATION BRONCHOSCOPY (N/A )  Patient Location: PACU  Anesthesia Type:General  Level of Consciousness: awake  Airway & Oxygen Therapy: Patient Spontanous Breathing and Patient connected to face mask oxygen  Post-op Assessment: Report given to RN and Post -op Vital signs reviewed and stable  Post vital signs: Reviewed and stable  Last Vitals:  Vitals Value Taken Time  BP 98/76 08/15/2017  2:43 PM  Temp    Pulse 84 08/15/2017  2:45 PM  Resp 17 08/15/2017  2:45 PM  SpO2 99 % 08/15/2017  2:45 PM  Vitals shown include unvalidated device data.  Last Pain:  Vitals:   08/15/17 1127  TempSrc: Oral  PainSc: 0-No pain         Complications: No apparent anesthesia complications

## 2017-08-15 NOTE — Anesthesia Procedure Notes (Signed)
Procedure Name: Intubation Date/Time: 08/15/2017 1:23 PM Performed by: Dionne Bucy, CRNA Pre-anesthesia Checklist: Patient identified, Patient being monitored, Timeout performed, Emergency Drugs available and Suction available Patient Re-evaluated:Patient Re-evaluated prior to induction Oxygen Delivery Method: Circle system utilized Preoxygenation: Pre-oxygenation with 100% oxygen Induction Type: IV induction Ventilation: Mask ventilation without difficulty Laryngoscope Size: Mac and 3 Grade View: Grade I Tube type: Oral Tube size: 8.5 mm Number of attempts: 1 Airway Equipment and Method: Stylet Placement Confirmation: ETT inserted through vocal cords under direct vision,  positive ETCO2 and breath sounds checked- equal and bilateral Secured at: 21 cm Tube secured with: Tape Dental Injury: Teeth and Oropharynx as per pre-operative assessment

## 2017-08-15 NOTE — ED Triage Notes (Signed)
Per ems pt had biopsy at [facility] today, under sedation. Pt DC'd at 1630. Upon DC felt numbness, weakness , tingling to RUE and RLE. Initial LVO 1, upon arrival LVO 0.

## 2017-08-15 NOTE — Interval H&P Note (Signed)
History and Physical Interval Note:  08/15/2017 12:25 PM  [name]  has presented today for surgery, with the diagnosis of LUNG NODULE  The various methods of treatment have been discussed with the patient and family. After consideration of risks, benefits and other options for treatment, the patient has consented to  Procedure(s): ELECTROMAGNETIC NAVIGATION BRONCHOSCOPY (N/A) as a surgical intervention .  The patient's history has been reviewed, patient examined, no change in status, stable for surgery.  I have reviewed the patient's chart and labs.  Questions were answered to the patient's satisfaction.     Flora Lipps
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
## 2017-08-15 NOTE — Discharge Instructions (Signed)

## 2017-08-16 ENCOUNTER — Telehealth: Payer: Self-pay | Admitting: Internal Medicine

## 2017-08-16 ENCOUNTER — Encounter: Payer: Self-pay | Admitting: *Deleted

## 2017-08-16 ENCOUNTER — Ambulatory Visit: Payer: Self-pay | Admitting: *Deleted

## 2017-08-16 ENCOUNTER — Encounter: Payer: Self-pay | Admitting: Internal Medicine

## 2017-08-16 LAB — CYTOLOGY - NON PAP

## 2017-08-16 LAB — SURGICAL PATHOLOGY

## 2017-08-16 NOTE — Telephone Encounter (Signed)
This encounter was created in error - please disregard.

## 2017-08-16 NOTE — Telephone Encounter (Signed)
Monica Cousin, RN       08/16/17 8:33 AM  Note    Pt had bronchoscopy yesterday, biopsy of lung nodule. Reports when she arrived home she noted her right side was "numb." States called 911 and seen in ED. Work up, which included head CT, negative. Per report, symptoms resolved by discharge. States she woke up this AM with tingling of right finger tips to elbow, and from elbow to shoulder, breast area "numb." Pt reports normal movement, ROM. Denies any other symptoms, speech clear. Directed pt to call her pulmonologist. Care advise given per protocol. Directed to go to ED if symptoms worsen.  Reason for Disposition . [1] Tingling (e.g., pins and needles) of the face, arm / hand, or leg / foot on one side of the body AND [2] present now    Post bronchoscopy. Pt seen in ED yesterday for similar symptoms.  Answer Assessment - Initial Assessment Questions 1. SYMPTOM: "What is the main symptom you are concerned about?" (e.g., weakness, numbness)     Tingling and numbness right arm, shoulder 2. ONSET: "When did this start?" (minutes, hours, days; while sleeping)     Yesterday after bronchoscopy, went to ED after arriving home 3. LAST NORMAL: "When was the last time you were normal (no symptoms)?"     Before bronchoscopy 4. PATTERN "Does this come and go, or has it been constant since it started?"  "Is it present now?"     constant 5. CARDIAC SYMPTOMS: "Have you had any of the following symptoms: chest pain, difficulty breathing, palpitations?"     no 6. NEUROLOGIC SYMPTOMS: "Have you had any of the following symptoms: headache, dizziness, vision loss, double vision, changes in speech, unsteady on your feet?"     no 7. OTHER SYMPTOMS: "Do you have any other symptoms?"     no 8. PREGNANCY: "Is there any chance you are pregnant?" "When was your last menstrual period?"     no  Protocols used: NEUROLOGIC DEFICIT-A-AH         Spoke with patient-states numb from finger tips to elbow(asleep  feeling), elbow to shoulder to Right breast-tingling feeling. All on the Right-able to still grip things and can raise arms above head without pain. Pt aware I will get advice from DR.  Pt to get to ER if symptoms change for the worse.

## 2017-08-16 NOTE — Telephone Encounter (Signed)
Patient calling Had a procedure yesterday and since then she has not felt right Patients right side feels like it is "completely asleep" No feeling from the elbow up Also has no appetite Please call to discuss at [phone]

## 2017-08-16 NOTE — Telephone Encounter (Signed)
Spoke with patient-aware of recs from Dr. Juanell Fairly. Pt will contact PCP on Monday if no better. Nothing more needed at this time.

## 2017-08-16 NOTE — Telephone Encounter (Signed)
Pt had bronchoscopy yesterday, biopsy of lung nodule. Reports when she arrived home she noted her right side was "numb." States called 911 and seen in ED. Work up, which included head CT, negative. Per report, symptoms resolved by discharge. States she woke up this AM with tingling of right finger tips to elbow, and from elbow to shoulder, breast area "numb." Pt reports normal movement, ROM. Denies any other symptoms, speech clear. Directed pt to call her pulmonologist. Care advise given per protocol. Directed to go to ED if symptoms worsen.  Reason for Disposition . [1] Tingling (e.g., pins and needles) of the face, arm / hand, or leg / foot on one side of the body AND [2] present now    Post bronchoscopy. Pt seen in ED yesterday for similar symptoms.  Answer Assessment - Initial Assessment Questions 1. SYMPTOM: "What is the main symptom you are concerned about?" (e.g., weakness, numbness)     Tingling and numbness right arm, shoulder 2. ONSET: "When did this start?" (minutes, hours, days; while sleeping)     Yesterday after bronchoscopy, went to ED after arriving home 3. LAST NORMAL: "When was the last time you were normal (no symptoms)?"     Before bronchoscopy 4. PATTERN "Does this come and go, or has it been constant since it started?"  "Is it present now?"     constant 5. CARDIAC SYMPTOMS: "Have you had any of the following symptoms: chest pain, difficulty breathing, palpitations?"     no 6. NEUROLOGIC SYMPTOMS: "Have you had any of the following symptoms: headache, dizziness, vision loss, double vision, changes in speech, unsteady on your feet?"     no 7. OTHER SYMPTOMS: "Do you have any other symptoms?"     no 8. PREGNANCY: "Is there any chance you are pregnant?" "When was your last menstrual period?"     no  Protocols used: NEUROLOGIC DEFICIT-A-AH

## 2017-08-16 NOTE — Telephone Encounter (Signed)
Since all the testing in the ED was negative that is reassuring. All of the biopsies were taken from the left side, so doubtful your symptoms would be related directly to the biopsies. See how you feel over the weekend, hopefully things will resolve. If not, call your PCP on Monday morning.

## 2017-08-19 ENCOUNTER — Encounter: Payer: Self-pay | Admitting: Emergency Medicine

## 2017-08-19 ENCOUNTER — Emergency Department
Admission: EM | Admit: 2017-08-19 | Discharge: 2017-08-19 | Disposition: A | Discharge status: Home / Self Care | Payer: Medicare HMO | Attending: Emergency Medicine | Admitting: Emergency Medicine

## 2017-08-19 ENCOUNTER — Emergency Department: Payer: Medicare HMO

## 2017-08-19 DIAGNOSIS — E039 Hypothyroidism, unspecified: Secondary | ICD-10-CM | POA: Diagnosis not present

## 2017-08-19 DIAGNOSIS — I10 Essential (primary) hypertension: Secondary | ICD-10-CM | POA: Insufficient documentation

## 2017-08-19 DIAGNOSIS — F17210 Nicotine dependence, cigarettes, uncomplicated: Secondary | ICD-10-CM | POA: Diagnosis not present

## 2017-08-19 DIAGNOSIS — I639 Cerebral infarction, unspecified: Secondary | ICD-10-CM | POA: Diagnosis not present

## 2017-08-19 DIAGNOSIS — J449 Chronic obstructive pulmonary disease, unspecified: Secondary | ICD-10-CM | POA: Diagnosis not present

## 2017-08-19 DIAGNOSIS — Z79899 Other long term (current) drug therapy: Secondary | ICD-10-CM | POA: Diagnosis not present

## 2017-08-19 DIAGNOSIS — R200 Anesthesia of skin: Secondary | ICD-10-CM | POA: Diagnosis not present

## 2017-08-19 DIAGNOSIS — Z91040 Latex allergy status: Secondary | ICD-10-CM | POA: Insufficient documentation

## 2017-08-19 DIAGNOSIS — R69 Illness, unspecified: Secondary | ICD-10-CM | POA: Diagnosis not present

## 2017-08-19 DIAGNOSIS — R202 Paresthesia of skin: Secondary | ICD-10-CM | POA: Diagnosis not present

## 2017-08-19 MED ORDER — ASPIRIN 81 MG PO CHEW
324.0000 mg | CHEWABLE_TABLET | Freq: Once | ORAL | Status: AC
  Administered 2017-08-19: 324 mg via ORAL
  Filled 2017-08-19: qty 4

## 2017-08-19 NOTE — ED Notes (Signed)
Pt is requesting to leave at this time. Pt states "I have been here long enough and I am ready to go home." MD Quale aware and in to see pt.

## 2017-08-19 NOTE — Discharge Instructions (Addendum)
Please continue to take your aspirin 325 mg by mouth daily. Follow-up by returning to the ER as soon as you can (I recommend you be admitted for further evaluation of your stroke and to prevent another stroke from happening). If you can not return today, please follow-up with your primary doctor tomorrow and Dr. Manuella Ghazi (neurology) as soon as possible. Call to setup follow-up.  Call your doctor or return to the ED if you have a headache, sudden and severe headache, confusion, slurred speech, facial droop, weakness or numbness in any arm or leg, extreme fatigue, vision problems, or other symptoms that concern you.

## 2017-08-19 NOTE — ED Triage Notes (Signed)
Pt c/o numbness to right neck, breast, and arm since having bronchoscopy here Thursday.  Pt declining labs.  "I just want answers, there is no need to take any more blood". Ambulatory. VSS. Speech clear. No facial asymmetry.

## 2017-08-19 NOTE — ED Notes (Signed)
Pt returned from MRI and is resting on stretcher at this time. Pt reports improvement of symptoms at this time. Awaiting MRI results. Will continue to monitor.

## 2017-08-19 NOTE — ED Notes (Signed)
Patient to Room 3, Monica Kelp RN aware.  Ambulatory.
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
## 2017-08-19 NOTE — ED Notes (Addendum)
Pt refused d/c vitals. Pt verbalized that she wanted to refuse further treatment and sign out AMA. Pt ambulated out of ED with steady gait.

## 2017-08-19 NOTE — ED Notes (Signed)
Pt states that Thursday she had bronchoscopy for lung biopsy - she was released and went home and after 30 min she became weak on right side - ems was called and pt transported to [facility] - complete work up per pt and everything was normal - pt reports that since Thursday she has had numbness to right neck/breast area/ and right arm - reports that her legs feel normal - pt reports that "it is like the areas are asleep" - pt has equal hand grips and hand strength bilat - pt can pull and push with equal strength bilat - denies any pain

## 2017-08-19 NOTE — ED Notes (Addendum)
Assumed care of pt at 1100. Pt resting on stretcher with even/unlabored respirations. VSS. Pt states "they have until 1pm to get me for MRI or I'm leaving here." Pt does not want any blood work or further testing. RN updated pt with plan of care. Awaiting MRI. Will continue to monitor.

## 2017-08-20 ENCOUNTER — Encounter: Payer: Self-pay | Admitting: Family Medicine

## 2017-08-20 ENCOUNTER — Telehealth: Payer: Self-pay

## 2017-08-20 ENCOUNTER — Telehealth: Payer: Self-pay | Admitting: Family Medicine

## 2017-08-20 ENCOUNTER — Ambulatory Visit (INDEPENDENT_AMBULATORY_CARE_PROVIDER_SITE_OTHER): Payer: Medicare HMO | Admitting: Family Medicine

## 2017-08-20 ENCOUNTER — Ambulatory Visit
Admission: RE | Admit: 2017-08-20 | Discharge: 2017-08-20 | Disposition: A | Discharge status: Home / Self Care | Payer: Medicare HMO | Source: Ambulatory Visit | Attending: Family Medicine | Admitting: Family Medicine

## 2017-08-20 VITALS — BP 94/60 | HR 83 | Temp 97.8°F | Ht 63.0 in | Wt 156.8 lb

## 2017-08-20 DIAGNOSIS — R69 Illness, unspecified: Secondary | ICD-10-CM | POA: Diagnosis not present

## 2017-08-20 DIAGNOSIS — F17200 Nicotine dependence, unspecified, uncomplicated: Secondary | ICD-10-CM | POA: Diagnosis not present

## 2017-08-20 DIAGNOSIS — B1920 Unspecified viral hepatitis C without hepatic coma: Secondary | ICD-10-CM | POA: Diagnosis not present

## 2017-08-20 DIAGNOSIS — Z8673 Personal history of transient ischemic attack (TIA), and cerebral infarction without residual deficits: Secondary | ICD-10-CM | POA: Insufficient documentation

## 2017-08-20 DIAGNOSIS — Z1231 Encounter for screening mammogram for malignant neoplasm of breast: Secondary | ICD-10-CM | POA: Diagnosis not present

## 2017-08-20 DIAGNOSIS — I639 Cerebral infarction, unspecified: Secondary | ICD-10-CM

## 2017-08-20 DIAGNOSIS — K7469 Other cirrhosis of liver: Secondary | ICD-10-CM | POA: Diagnosis not present

## 2017-08-20 NOTE — Telephone Encounter (Signed)
PLEASE NOTE: All timestamps contained within this report are represented as [place] Standard Time. CONFIDENTIALTY NOTICE: This fax transmission is intended only for the addressee. It contains information that is legally privileged, confidential or otherwise protected from use or disclosure. If you are not the intended recipient, you are strictly prohibited from reviewing, disclosing, copying using or disseminating any of this information or taking any action in reliance on or regarding this information. If you have received this fax in error, please notify us immediately by telephone so that we can arrange for its return to [place]. Phone: [phone], Toll-Free: [phone], Fax: [phone] Page: 1 of 2 Call Id: [id number] [place] Patient Name: [name] Gender: Female DOB: [birth year] Age: 62 Y 9 M 20 D Return Phone Number: [phone] (Primary), [phone] (Secondary) [place] [address] Client [facility] Night - Client Client Site [facility] Physician Eliezer Lofts - MD Contact Type Call Who Is Calling Patient / Member / Family / Caregiver Call Type Triage / Clinical Relationship To Patient Self Return Phone Number [phone] (Primary) Chief Complaint NUMBNESS/TINGLING- sudden on one side of the body or face Reason for Call Symptomatic / Request for [place] states she had surgery on Thursday. Their whole right side is numb. Neck, right arm. Translation No Nurse Assessment Nurse: Kellie Simmering, RN, [name] Date/Time Eilene Ghazi Time): 08/19/2017 8:04:34 AM Confirm and document reason for call. If symptomatic, describe symptoms. ---Caller states that she had bronchoscopy on Thursday and she fell her whole right side was like "jello". EMS took her to [facility] for a CT brain. Reports  everything negative for a stroke. Since then she has numbness neck, right breast and right arm/ shoulder/arm. Was told to call her PCP if numbness continued. Also numbness lower part of lip. Does the patient have any new or worsening symptoms? ---Yes Will a triage be completed? ---Yes Related visit to physician within the last 2 weeks? ---Yes Does the PT have any chronic conditions? (i.e. diabetes, asthma, etc.) ---Yes List chronic conditions. ---COPD cirrhosis heart murmur "spot" of her lung Is this a behavioral health or substance abuse call? ---No Guidelines Guideline Title Affirmed Question Affirmed Notes Nurse Date/Time (Eastern Time) Neurologic Deficit [1] Tingling (e.g., pins and needles) of the face, arm / hand, or leg / foot on one side of the body AND [2] present now Gerilyn Nestle 08/19/2017 8:09:28 AM Disp. Time Eilene Ghazi Time) Disposition Final User 08/19/2017 8:02:34 AM Send to Urgent Antony Haste PLEASE NOTE: All timestamps contained within this report are represented as [place] Standard Time. CONFIDENTIALTY NOTICE: This fax transmission is intended only for the addressee. It contains information that is legally privileged, confidential or otherwise protected from use or disclosure. If you are not the intended recipient, you are strictly prohibited from reviewing, disclosing, copying using or disseminating any of this information or taking any action in reliance on or regarding this information. If you have received this fax in error, please notify us immediately by telephone so that we can arrange for its return to [place]. Phone: [phone], Toll-Free: [phone], Fax: [phone] Page: 2 of 2 Call Id: [id number] 08/19/2017 8:18:04 AM Go to ED Now (or PCP triage) Yes Kellie Simmering, RN, [name] Disposition Overriden: See Physician within 4 Hours (or PCP triage) Override Reason: Patient's symptoms need a higher level of care Caller Disagree/Comply Comply Caller Understands  No PreDisposition Did  not know what to do Care Advice Given Per Guideline GO TO ED NOW (OR PCP TRIAGE): * IF NO PCP TRIAGE: You need to be seen. [facility] to the [facility] at [facility] within the next hour. Leave as soon as you can. DRIVING: Another adult should drive. CARE ADVICE given per Neurologic Deficit (Adult) guideline. Referrals [facility] - ED

## 2017-08-20 NOTE — Progress Notes (Signed)
Subjective:    Patient ID: [name], female    DOB: [birth year], 62 y.o.   MRN: [id number]  HPI    62 year old female smoker  presents after leaving ER AMA on 5/27.  Seen in ER on 08/15/17 for weakness Issues noted first on  5/23 following a scheduled biopsy of a previously identified left upper lobe lesion. (Dr. Ashby Dawes) ( path showed no malignancy) She recalls awakening from the anesthesia essentially normal, but subsequently felt sudden right-sided numbness, in her upper and lower extremities, without speech difficulty, without vision changes, without confusion, disorientation or syncope. Symptoms did not resolve.  CT head:  Normal.  On 5/27 returned to ER with tingling in right arm given continued symptoms.. Never went away.  MRI brain showed:  IMPRESSION: 1. Acute nonhemorrhagic cortical infarct involving predominantly the left postcentral gyrus, corresponding to right upper extremity sensory symptoms. 2. Small focus involving the left precentral gyrus as well. 3. Diffuse subcortical white matter changes are mildly advanced for age. These likely reflect the sequela of chronic microvascular Ischemia  Started on ASA 325 mg daily but left AMA.  Referral made to neurology. Appt. 09/11/2017 Dr. Melrose Nakayama.   Today she reports numbness in right mid and upper arm, tingling in right anterior neck.  No weakness, no grip issue.  no speech issue, no confusion.  No chest pain, no dizziness, no SOB.   LDL not at goal < 70 Not on statin medicaiton Lab Results  Component Value Date   CHOL 156 06/14/2017   HDL 50.20 06/14/2017   LDLCALC 86 06/14/2017   TRIG 98.0 06/14/2017   CHOLHDL 3 06/14/2017      Review of Systems  Constitutional: Negative for fatigue and fever.  HENT: Negative for congestion.   Eyes: Negative for pain.  Respiratory: Negative for cough and shortness of breath.   Cardiovascular: Negative for chest pain, palpitations and leg swelling.  Gastrointestinal:  Negative for abdominal pain.  Genitourinary: Negative for dysuria and vaginal bleeding.  Musculoskeletal: Negative for back pain.  Neurological: Positive for numbness. Negative for syncope, light-headedness and headaches.  Psychiatric/Behavioral: Negative for dysphoric mood.       Objective:   Physical Exam  Constitutional: She is oriented to person, place, and time. Vital signs are normal. She appears well-developed and well-nourished. She is cooperative.  Non-toxic appearance. She does not appear ill. No distress.  HENT:  Head: Normocephalic.  Right Ear: Hearing, tympanic membrane, external ear and ear canal normal. Tympanic membrane is not erythematous, not retracted and not bulging.  Left Ear: Hearing, tympanic membrane, external ear and ear canal normal. Tympanic membrane is not erythematous, not retracted and not bulging.  Nose: No mucosal edema or rhinorrhea. Right sinus exhibits no maxillary sinus tenderness and no frontal sinus tenderness. Left sinus exhibits no maxillary sinus tenderness and no frontal sinus tenderness.  Mouth/Throat: Uvula is midline, oropharynx is clear and moist and mucous membranes are normal.  Eyes: Pupils are equal, round, and reactive to light. Conjunctivae, EOM and lids are normal. Lids are everted and swept, no foreign bodies found.  Neck: Trachea normal and normal range of motion. Neck supple. Carotid bruit is not present. No thyroid mass and no thyromegaly present.  Cardiovascular: Normal rate, regular rhythm, S1 normal, S2 normal, normal heart sounds, intact distal pulses and normal pulses. Exam reveals no gallop and no friction rub.  No murmur heard. Pulmonary/Chest: Effort normal and breath sounds normal. No tachypnea. No respiratory distress. She has no decreased breath  sounds. She has no wheezes. She has no rhonchi. She has no rales.  Abdominal: Soft. Normal appearance and bowel sounds are normal. There is no tenderness.  Neurological: She is alert  and oriented to person, place, and time. She has normal strength and normal reflexes. A sensory deficit is present. No cranial nerve deficit. She exhibits normal muscle tone. She displays a negative Romberg sign. Coordination and gait normal. GCS eye subscore is 4. GCS verbal subscore is 5. GCS motor subscore is 6.  Nml cerebellar exam   No papilledema   Numbness in right upper arm to elbow and on right latera neck.. Otherwise sensory test nml.  Skin: Skin is warm, dry and intact. No rash noted.  Psychiatric: She has a normal mood and affect. Her speech is normal and behavior is normal. Judgment and thought content normal. Her mood appears not anxious. Cognition and memory are normal. Cognition and memory are not impaired. She does not exhibit a depressed mood. She exhibits normal recent memory and normal remote memory.       Assessment & Plan:

## 2017-08-20 NOTE — Assessment & Plan Note (Signed)
Quit! Risk factor.

## 2017-08-20 NOTE — Assessment & Plan Note (Signed)
Acute CVA.  needs carotid dopplers, ECHO to eval for embolic source.  Stop smoking, cholesterol almost at goal < 70 ( will contact liver MD to see if statin can be added or if zetia safer)  Keep appt with neuro.  Return precautions given.

## 2017-08-20 NOTE — Telephone Encounter (Signed)
I spoke with Dr Diona Browner and she said to leave pt at 3:45 appt today; pt notified and voiced understanding and it pt's condition worsens pt will cb. FYI to DR [facility].

## 2017-08-20 NOTE — Telephone Encounter (Signed)
Pt called to schedule appointment with Dr Diona Browner today; according to ED record dated 08/19/17 the pt left the hospital AMA; initial recommendation was for pt be admitted and worked up due to ischemic stroke; spoke with Mearl Latin at [facility] for advisement on office visit; pt previously scheduled per [facility] agent Tiffany, with Dr Diona Browner today at [phone]; Rena informed the pt that she will check with Dr Diona Browner to confirm appointment time; her best contact numbers are cell [phone] and home [phone].

## 2017-08-20 NOTE — Telephone Encounter (Signed)
MRI did show infarct on 5/28. Per note symptoms ongoing for 4 days.  Pt left ER AMA. Refused further treatment at that time. Pt given aspirin at ER visit.  Offered pt earlier OV but she has appt for mammogram and does not wish to cancel.

## 2017-08-20 NOTE — Patient Instructions (Addendum)
Quit smoking!  Continue 325 mg aspirin daily. Please stop at the front desk to set up referral.  We will call you about cholesterol treatment once we speak with Dr. Mont Dutton, liver doctor.  Vit D 400 IU twice daily.  Keep appt with neurology as scheduled.

## 2017-08-20 NOTE — Telephone Encounter (Signed)
Got a call from [facility]; pt was seen at [facility] dx with ischemic stroke; pt left AMA. [facility] scheduled pt appt 08/20/17 at 3:45 but is only 15' appt. I spoke with pt having numbness and tingling on entire rt side and fatigue. Pt said not trouble walking, talking,swallowing,no trouble to pick up glass. Pt is going to call for neurology appt also.pt has mammogram appt 10:15 at [facility] today. Will speak with Dr Diona Browner when comes in this morning about the 15' appt and call pt back.

## 2017-08-20 NOTE — Assessment & Plan Note (Signed)
LFTS nml, hep c in remission.

## 2017-08-22 ENCOUNTER — Ambulatory Visit (INDEPENDENT_AMBULATORY_CARE_PROVIDER_SITE_OTHER): Payer: Medicare HMO

## 2017-08-22 ENCOUNTER — Other Ambulatory Visit: Payer: Self-pay

## 2017-08-22 DIAGNOSIS — I639 Cerebral infarction, unspecified: Secondary | ICD-10-CM

## 2017-08-22 DIAGNOSIS — Q211 Atrial septal defect: Secondary | ICD-10-CM | POA: Diagnosis not present

## 2017-08-22 DIAGNOSIS — R69 Illness, unspecified: Secondary | ICD-10-CM | POA: Diagnosis not present

## 2017-08-23 ENCOUNTER — Ambulatory Visit (INDEPENDENT_AMBULATORY_CARE_PROVIDER_SITE_OTHER): Payer: Medicare HMO

## 2017-08-23 ENCOUNTER — Telehealth: Payer: Self-pay | Admitting: *Deleted

## 2017-08-23 DIAGNOSIS — I639 Cerebral infarction, unspecified: Secondary | ICD-10-CM | POA: Diagnosis not present

## 2017-08-23 MED ORDER — EZETIMIBE 10 MG PO TABS: 10.0000 mg | ORAL_TABLET | Freq: Every day | ORAL | 3 refills | Status: DC

## 2017-08-23 NOTE — Telephone Encounter (Signed)
-----   Message from Jinny Sanders, MD sent at 08/23/2017  5:31 PM EDT ----- Notify pt she does not have critical stenosis in her carotids.. No surgery needed, but she does have some blockage that we need to re-eval for progression next year.  Need to start chol med.. I have not heard back from her GI MD.. So we will start zetia which is not contraindicated in liver issues if she is aggreeable. Please call in zetia generic 10 mg daily #30 11 rf.

## 2017-08-23 NOTE — Telephone Encounter (Signed)
[name] notified as instructed by telephone.  She is agreeable to starting Zetia.  Rx sent in to [facility] as instructed by Dr. Diona Browner.

## 2017-08-26 NOTE — Telephone Encounter (Signed)
Pt saw Dr Diona Browner on 08/20/17.

## 2017-08-26 NOTE — Progress Notes (Signed)
[facility] Consultation      Assessment and Plan:  Left upper lobe cavitary lung nodule. - Status post ENB bronchoscopy negative for malignancy. - ACE level elevated suggestive of sarcoidosis, though biopsies did not show evidence of granulomas. -PET scan within 3 months, if suggestive of cancer will consider referral for needle biopsy versus resection.  Ischemic stroke. - Patient suffered small area of ischemic stroke immediately after bronchoscopy. - Discussed with patient, regarding importance of follow-up with neurology. - Given this complication I would be concerned about future procedures on her, will therefore defer any further biopsy procedures for at least 3 months.  Cirrhosis. -Discussed that this will complicate any potential diagnosis or treatment.  COPD with ongoing tobacco abuse. - Spent 3 minutes in discussion. - We will start the patient on Chantix. - Patient has minimal dyspnea, no need for inhaler at this time.  Will perform PFT at a later time.  Orders Placed This Encounter  Procedures  . PET scan   Return in about 3 months (around 11/27/2017).   Date: 08/26/2017  MRN# [id number] [name] Jan 10, 1956    [name] is a 62 y.o. old female seen in consultation for chief complaint of:    Chief Complaint  Patient presents with  . COPD    pt here for f/u bronchoscopy.  . Lung Lesion    HPI:  The patient is a 62 year old female smoker, she was noted to have left upper lobe cavitary lesion, without significant lymphadenopathy.  She has a history of cirrhosis with continued alcohol abuse.  TB QuantiFERON was negative, ACE level was elevated at 108, c-ANCA, p-ANCA were negative.  Patient underwent ENB bronchoscopy with biopsies on 5/23 of the left upper lobe nodule, these were negative for cancer. After the discharge the patient had symptoms of right-sided numbness, went to [facility] ED on 5/23, underwent a CT of the head which was unrevealing and  was discharged. She had continued symptoms, went back to the ED at [facility] on 5/27, she underwent an MRI which showed acute/subacute nonhemorrhagic infarct along the postcentral gyrus measuring up to 1.5 cm, appearing to coincide with the abnormal sensation in her right side.  She was recommended admission for further work-up however she left AGAINST MEDICAL ADVICE.  She now has an appointment to follow-up with neurology Biopsy results were negative for malignancy, and overall unrevealing.  ACE level was mildly elevated.   She remains on spiriva once daily, she continues to smoke about half ppd. She has quit in the past for up to 2-3 months.  She continues to follow up at [facility] for her cirrhosis.   **Imaging personally reviewed, CT chest 07/22/17; LUL cavitary lesion; emphysema. No significant mediastinal lymphadenopathy.  **07/30/2017 labs;  QuantiFERON negative ACE level elevated at 108 C-ANCA, p-ANCA negative. **ENB bronchoscopy 08/15/2017; biopsies negative for cancer.   Social Hx:   Social History   Tobacco Use  . Smoking status: Current Some Day Smoker    Packs/day: 0.50    Years: 40.00    Pack years: 20.00    Types: Cigarettes  . Smokeless tobacco: Former Systems developer    Types: Chew  . Tobacco comment: decreasing use 05/30/2011  Substance Use Topics  . Alcohol use: No  . Drug use: No   Medication:    Current Outpatient Medications:  .  albuterol (PROVENTIL HFA;VENTOLIN HFA) 108 (90 Base) MCG/ACT inhaler, Inhale 2 puffs into the lungs every 6 (six) hours as needed for wheezing or shortness  of breath., Disp: , Rfl:  .  aspirin 325 MG tablet, Take 325 mg by mouth daily., Disp: , Rfl:  .  ezetimibe (ZETIA) 10 MG tablet, Take 1 tablet (10 mg total) by mouth daily., Disp: 90 tablet, Rfl: 3 .  furosemide (LASIX) 20 MG tablet, Take 40 mg by mouth daily. , Disp: , Rfl:  .  ibuprofen (ADVIL,MOTRIN) 200 MG tablet, Take 200-400 mg by mouth daily as needed for headache or moderate pain.,  Disp: , Rfl:  .  lidocaine-prilocaine (EMLA) cream, Apply 1 application topically as needed., Disp: 30 g, Rfl: 2 .  QUEtiapine (SEROQUEL) 300 MG tablet, Take 300 mg by mouth at bedtime as needed (sleep). , Disp: , Rfl:  .  spironolactone (ALDACTONE) 25 MG tablet, Take 100 mg by mouth daily. , Disp: , Rfl:  .  SYNTHROID 88 MCG tablet, Take 1 tablet (88 mcg total) by mouth daily., Disp: 90 tablet, Rfl: 0 .  tiotropium (SPIRIVA) 18 MCG inhalation capsule, Place 18 mcg into inhaler and inhale daily., Disp: , Rfl:  .  traZODone (DESYREL) 100 MG tablet, Take 100 mg by mouth at bedtime as needed for sleep. , Disp: , Rfl:    Allergies:  Demerol; Penicillins; Other; Latex; and Oxycodone hcl  Review of Systems: Gen:  Denies  fever, sweats, chills HEENT: Denies blurred vision, double vision. bleeds, sore throat Cvc:  No dizziness, chest pain. Resp:   Denies cough or sputum production, shortness of breath Gi: Denies swallowing difficulty, stomach pain. Gu:  Denies bladder incontinence, burning urine Ext:   No Joint pain, stiffness. Skin: No skin rash,  hives  Endoc:  No polyuria, polydipsia. Psych: No depression, insomnia. Other:  All other systems were reviewed with the patient and were negative other that what is mentioned in the HPI.   Physical Examination:   VS: BP (!) 102/58 (BP Location: Left Arm, Cuff Size: Normal)   Pulse 75   Resp 16   Ht 5\' 3"  (1.6 m)   Wt 157 lb (71.2 kg)   SpO2 96%   BMI 27.81 kg/m   General Appearance: No distress  Neuro:without focal findings,  speech normal, patient's strength and reflexes are normal in upper and lower extremities.  She has decreased sensation in her right arm, though she maintains normal pain sensation. HEENT: PERRLA, EOM intact.   Pulmonary: Decreased air entry bilaterally.  No wheezing.  CardiovascularNormal S1,S2.  No m/r/g.   Abdomen: Benign, Soft, non-tender. Renal:  No costovertebral tenderness  GU:  No performed at this  time. Endoc: No evident thyromegaly, no signs of acromegaly. Skin:   warm, no rashes, no ecchymosis  Extremities: normal, no cyanosis, clubbing.  Other findings:    LABORATORY PANEL:   CBC No results for input(s): WBC, HGB, HCT, PLT in the last 168 hours. ------------------------------------------------------------------------------------------------------------------  Chemistries  No results for input(s): NA, K, CL, CO2, GLUCOSE, BUN, CREATININE, CALCIUM, MG, AST, ALT, ALKPHOS, BILITOT in the last 168 hours.  Invalid input(s): GFRCGP ------------------------------------------------------------------------------------------------------------------  Cardiac Enzymes No results for input(s): TROPONINI in the last 168 hours. ------------------------------------------------------------  RADIOLOGY:  No results found.     Thank  you for the consultation and for allowing [facility] to assist in the care of your patient. Our recommendations are noted above.  Please contact us if we can be of further service.   Marda Stalker, MD.  Board Certified in Internal Medicine, Pulmonary Medicine, [place], and Sleep Medicine.  [facility] Office Number: [phone]  Patricia Pesa, M.D.  Merton Border, M.D  08/26/2017

## 2017-08-27 ENCOUNTER — Encounter: Payer: Self-pay | Admitting: Internal Medicine

## 2017-08-27 ENCOUNTER — Ambulatory Visit (INDEPENDENT_AMBULATORY_CARE_PROVIDER_SITE_OTHER): Payer: Medicare HMO | Admitting: Internal Medicine

## 2017-08-27 VITALS — BP 102/58 | HR 75 | Resp 16 | Ht 63.0 in | Wt 157.0 lb

## 2017-08-27 DIAGNOSIS — R911 Solitary pulmonary nodule: Secondary | ICD-10-CM | POA: Diagnosis not present

## 2017-08-27 DIAGNOSIS — R69 Illness, unspecified: Secondary | ICD-10-CM | POA: Diagnosis not present

## 2017-08-27 DIAGNOSIS — K7030 Alcoholic cirrhosis of liver without ascites: Secondary | ICD-10-CM

## 2017-08-27 MED ORDER — VARENICLINE TARTRATE 0.5 MG X 11 & 1 MG X 42 PO MISC: ORAL | 0 refills | Status: DC

## 2017-08-27 NOTE — Patient Instructions (Addendum)
--  Quitting smoking is the most important thing that you can do for your health.  --Quitting smoking will have greater affect on your health than any medicine that we can give you.   Make sure that you follow up with your neurologist for stroke.  Will repeat scan (PET) scan in 3 months and follow up after that.   --The best way to quit is to set a quit date, usually a day that has meaning like someone's birthday.  --Start any medication prescribed for quitting one week before you quit date. Then toss out the cigarettes on your quit date.  --If you start smoking again, start from scratch--set another quit day and try again!

## 2017-08-29 ENCOUNTER — Other Ambulatory Visit: Payer: Medicare HMO

## 2017-09-03 ENCOUNTER — Telehealth: Payer: Self-pay | Admitting: *Deleted

## 2017-09-03 MED ORDER — ATORVASTATIN CALCIUM 10 MG PO TABS: 10.0000 mg | ORAL_TABLET | Freq: Every day | ORAL | 0 refills | Status: DC

## 2017-09-03 NOTE — Telephone Encounter (Signed)
[name] notified by telephone that [place] from the [facility] was okay with [name] starting a statin medication for her cholesterol.  I advised her to stop taking the Zetia and start taking Atorvastatin 10 mg at bedtime.  Prescription sent to [facility].  Patient states understanding.

## 2017-09-04 DIAGNOSIS — R69 Illness, unspecified: Secondary | ICD-10-CM | POA: Diagnosis not present

## 2017-09-05 ENCOUNTER — Ambulatory Visit (INDEPENDENT_AMBULATORY_CARE_PROVIDER_SITE_OTHER): Payer: Medicare HMO | Admitting: Family Medicine

## 2017-09-05 ENCOUNTER — Encounter: Payer: Self-pay | Admitting: Family Medicine

## 2017-09-05 VITALS — BP 90/60 | HR 74 | Temp 97.6°F | Ht 63.0 in | Wt 157.5 lb

## 2017-09-05 DIAGNOSIS — R911 Solitary pulmonary nodule: Secondary | ICD-10-CM | POA: Diagnosis not present

## 2017-09-05 DIAGNOSIS — B182 Chronic viral hepatitis C: Secondary | ICD-10-CM

## 2017-09-05 DIAGNOSIS — I6529 Occlusion and stenosis of unspecified carotid artery: Secondary | ICD-10-CM

## 2017-09-05 DIAGNOSIS — K7469 Other cirrhosis of liver: Secondary | ICD-10-CM

## 2017-09-05 DIAGNOSIS — I639 Cerebral infarction, unspecified: Secondary | ICD-10-CM | POA: Diagnosis not present

## 2017-09-05 DIAGNOSIS — F319 Bipolar disorder, unspecified: Secondary | ICD-10-CM

## 2017-09-05 DIAGNOSIS — R69 Illness, unspecified: Secondary | ICD-10-CM | POA: Diagnosis not present

## 2017-09-05 DIAGNOSIS — Z79899 Other long term (current) drug therapy: Secondary | ICD-10-CM | POA: Diagnosis not present

## 2017-09-05 DIAGNOSIS — J449 Chronic obstructive pulmonary disease, unspecified: Secondary | ICD-10-CM

## 2017-09-05 DIAGNOSIS — B1920 Unspecified viral hepatitis C without hepatic coma: Secondary | ICD-10-CM | POA: Diagnosis not present

## 2017-09-05 NOTE — Assessment & Plan Note (Signed)
Followed by Hep clinic

## 2017-09-05 NOTE — Assessment & Plan Note (Signed)
Stable control on spiriva. Stop smoking on chantix.

## 2017-09-05 NOTE — Assessment & Plan Note (Signed)
Repeat US in 102 years per recs. Started on statin, recommended smokimh cessaiton.

## 2017-09-05 NOTE — Assessment & Plan Note (Signed)
HAs upcoming appt with neurology.

## 2017-09-05 NOTE — Progress Notes (Signed)
Subjective:    Patient ID: [name], female    DOB: 1955-07-21, 62 y.o.   MRN: [id number]  HPI  The patient presents for complete physical and review of chronic health problems.    The patient saw Candis Musa, LPN for medicare wellness. Note reviewed in detail and important notes copied below.  Health maintenance:  Colon cancer screening - PCP please address at next appt  Today:  Recent CVA on ASA, now on statin ( but did have initial SE, will try 1/2 tabs).. New LDL goal < 70.  Carotid dopplers showed noncritical stenosis and ECHO showed no embolic source but Slight thickening of heart walls and decreased filling.  Pt has upcoming OV with Neuro. Bipolar DO:  Moderate controlFollowed by psychiatry on celexa, lamictal, latuda and seroquel. Trazodone for sleep.  Chronic hepatitis C, compensated: followed by [facility] hepatitis clinic.  on spironolactone for fluid control.  Mild COPD: Using spiriva. No SOB, no wheeze. She does have intermittant daily cough.  For control and albuterol prn.   Hypothyroid:  On synthroid.. Stable free t3 and free t4 Lab Results  Component Value Date   TSH 0.06 (L) 12/12/2016     Prediabetes:  Lab Results  Component Value Date   HGBA1C 5.1 12/12/2016     Exercise: walking 3-4 times a week 30-60 min  Diet: moderate   Bipolar DO:  Moderate control Followed by psychiatry  on  latuda and Seroquel and Trazodone for sleep as needed.   Social History /Family History/Past Medical History reviewed in detail and updated in EMR if needed.  Advance directives and end of life planning reviewed in detail with patient and documented in EMR. Patient given handout on advance care directives if needed. HCPOA and living will updated if needed. Fall Risk  06/14/2017 06/13/2016 06/07/2015 06/03/2014 05/15/2012  Falls in the past year? Yes Yes Yes Yes Yes  Comment 6 falls in past 12 mths; minor injuries; no medical treatment pt reports multiple falls with  injury; pt states she has fallen on ice and out of bed; no medical treatment - - -  Number falls in past yr: 2 or more 2 or more 2 or more 2 or more 2 or more  Injury with Fall? Yes Yes No No -  Risk Factor Category  - - - High Fall Risk High Fall Risk  Risk for fall due to : History of fall(s) - - History of fall(s) -   Depression screen [facility] 2/9 06/14/2017 06/13/2016 06/07/2015  Decreased Interest 2 2 1   Down, Depressed, Hopeless 2 2 1   PHQ - 2 Score 4 4 2   Altered sleeping 1 3 3   Tired, decreased energy 0 2 3  Change in appetite 3 3 3   Feeling bad or failure about yourself  2 3 3   Trouble concentrating 1 2 3   Moving slowly or fidgety/restless 1 3 3   Suicidal thoughts 0 2 0  PHQ-9 Score 12 22 20   Difficult doing work/chores Somewhat difficult (No Data) Very difficult     Review of Systems  Constitutional: Negative for fatigue and fever.  HENT: Negative for congestion.   Eyes: Negative for pain.  Respiratory: Negative for cough and shortness of breath.   Cardiovascular: Negative for chest pain, palpitations and leg swelling.  Gastrointestinal: Negative for abdominal pain.  Genitourinary: Negative for dysuria and vaginal bleeding.  Musculoskeletal: Negative for back pain.  Neurological: Positive for numbness. Negative for syncope, light-headedness and headaches.  Numbness remains on right lateral neck and right upper arm  Psychiatric/Behavioral: Negative for dysphoric mood.       Objective:   Physical Exam  Constitutional: Vital signs are normal. She appears well-developed and well-nourished. She is cooperative.  Non-toxic appearance. She does not appear ill. No distress.   Excessively tanned skin bruises on left lower leg  spider veins on face and legs  central obesity  HENT:  Head: Normocephalic.  Right Ear: Hearing, tympanic membrane, external ear and ear canal normal.  Left Ear: Hearing, tympanic membrane, external ear and ear canal normal.  Nose: Nose normal.    Eyes: Pupils are equal, round, and reactive to light. Conjunctivae, EOM and lids are normal. Lids are everted and swept, no foreign bodies found.  Neck: Trachea normal and normal range of motion. Neck supple. Carotid bruit is not present. No thyroid mass and no thyromegaly present.  Cardiovascular: Normal rate, regular rhythm, S1 normal, S2 normal, normal heart sounds and intact distal pulses. Exam reveals no gallop.  No murmur heard. Pulmonary/Chest: Effort normal and breath sounds normal. No respiratory distress. She has no wheezes. She has no rhonchi. She has no rales.  Abdominal: Soft. Normal appearance and bowel sounds are normal. She exhibits no distension, no fluid wave, no abdominal bruit and no mass. There is no hepatosplenomegaly. There is no tenderness. There is no rebound, no guarding and no CVA tenderness. No hernia.  Lymphadenopathy:    She has no cervical adenopathy.    She has no axillary adenopathy.  Neurological: She is alert. She has normal strength. No cranial nerve deficit or sensory deficit.  Skin: Skin is warm, dry and intact. No rash noted.  Psychiatric: Her speech is normal and behavior is normal. Judgment normal. Her mood appears not anxious. Cognition and memory are normal. She does not exhibit a depressed mood.          Assessment & Plan:  The patient's preventative maintenance and recommended screening tests for an annual wellness exam were reviewed in full today. Brought up to date unless services declined.  Counselled on the importance of diet, exercise, and its role in overall health and mortality. The patient's FH and SH was reviewed, including their home life, tobacco status, and drug and alcohol status.   Vaccines: Refused  tdap and shingles refused. Up to date with hep A abd B. She refuses vaccines today. Colon: Plans FOBT.. Given. Mammo: 07/2016, plan every 2 years  PAP/DVE: last pap 05/2016: no HPV seen, nml pap.  Repeat in 5 years No family history  of of ovarian cancer uterine cancer. Asymptomatic, no vaginal bleeding or low abd cramping. DEXA:  Osteopenia 07/2016 rec ca, vit d and exercise.. Smoker: 30 years but 3-4 per day, trying to quit on chantix

## 2017-09-05 NOTE — Patient Instructions (Addendum)
With a few days then try again 1/2 tabs daily of atorvastatin.  Make sure to get calcium in diet, add vit D 400IU daily, weight bearing exercise  Set quit date on cahntix and make changes to stop quitting as dicsussed.  Return cologuard.

## 2017-09-05 NOTE — Assessment & Plan Note (Signed)
Evaluated by Dr. Ashby Dawes.

## 2017-09-05 NOTE — Assessment & Plan Note (Signed)
Followed by Psychiatry and counselor.

## 2017-09-11 DIAGNOSIS — R200 Anesthesia of skin: Secondary | ICD-10-CM | POA: Diagnosis not present

## 2017-09-18 DIAGNOSIS — R69 Illness, unspecified: Secondary | ICD-10-CM | POA: Diagnosis not present

## 2017-09-19 DIAGNOSIS — R69 Illness, unspecified: Secondary | ICD-10-CM | POA: Diagnosis not present

## 2017-09-24 DIAGNOSIS — R69 Illness, unspecified: Secondary | ICD-10-CM | POA: Diagnosis not present

## 2017-09-24 DIAGNOSIS — K7469 Other cirrhosis of liver: Secondary | ICD-10-CM | POA: Diagnosis not present

## 2017-09-24 DIAGNOSIS — Z720 Tobacco use: Secondary | ICD-10-CM | POA: Diagnosis not present

## 2017-09-24 DIAGNOSIS — B182 Chronic viral hepatitis C: Secondary | ICD-10-CM | POA: Diagnosis not present

## 2017-09-24 DIAGNOSIS — Z8673 Personal history of transient ischemic attack (TIA), and cerebral infarction without residual deficits: Secondary | ICD-10-CM | POA: Diagnosis not present

## 2017-09-24 DIAGNOSIS — I639 Cerebral infarction, unspecified: Secondary | ICD-10-CM | POA: Diagnosis not present

## 2017-10-03 DIAGNOSIS — R69 Illness, unspecified: Secondary | ICD-10-CM | POA: Diagnosis not present

## 2017-10-07 DIAGNOSIS — I491 Atrial premature depolarization: Secondary | ICD-10-CM | POA: Diagnosis not present

## 2017-10-08 DIAGNOSIS — R079 Chest pain, unspecified: Secondary | ICD-10-CM | POA: Diagnosis not present

## 2017-10-08 DIAGNOSIS — Z1211 Encounter for screening for malignant neoplasm of colon: Secondary | ICD-10-CM | POA: Diagnosis not present

## 2017-10-08 DIAGNOSIS — R69 Illness, unspecified: Secondary | ICD-10-CM | POA: Diagnosis not present

## 2017-10-08 DIAGNOSIS — Z8673 Personal history of transient ischemic attack (TIA), and cerebral infarction without residual deficits: Secondary | ICD-10-CM | POA: Diagnosis not present

## 2017-10-08 DIAGNOSIS — Z720 Tobacco use: Secondary | ICD-10-CM | POA: Diagnosis not present

## 2017-10-08 DIAGNOSIS — Z1212 Encounter for screening for malignant neoplasm of rectum: Secondary | ICD-10-CM | POA: Diagnosis not present

## 2017-10-08 LAB — COLOGUARD: COLOGUARD: POSITIVE

## 2017-10-11 ENCOUNTER — Encounter: Payer: Self-pay | Admitting: Family Medicine

## 2017-10-11 ENCOUNTER — Ambulatory Visit (INDEPENDENT_AMBULATORY_CARE_PROVIDER_SITE_OTHER): Payer: Medicare HMO | Admitting: Family Medicine

## 2017-10-11 ENCOUNTER — Other Ambulatory Visit: Payer: Self-pay | Admitting: Family Medicine

## 2017-10-11 DIAGNOSIS — N950 Postmenopausal bleeding: Secondary | ICD-10-CM | POA: Diagnosis not present

## 2017-10-11 DIAGNOSIS — R609 Edema, unspecified: Secondary | ICD-10-CM | POA: Diagnosis not present

## 2017-10-11 DIAGNOSIS — N898 Other specified noninflammatory disorders of vagina: Secondary | ICD-10-CM

## 2017-10-11 LAB — POCT WET PREP (WET MOUNT): TRICHOMONAS WET PREP HPF POC: ABSENT

## 2017-10-11 MED ORDER — METRONIDAZOLE 0.75 % VA GEL: 1.0000 | Freq: Two times a day (BID) | VAGINAL | 0 refills | Status: DC

## 2017-10-11 MED ORDER — VARENICLINE TARTRATE 0.5 MG PO TABS
0.5000 mg | ORAL_TABLET | Freq: Two times a day (BID) | ORAL | 2 refills | Status: DC
Start: 2017-10-11 — End: 2018-01-22

## 2017-10-11 MED ORDER — SYNTHROID 88 MCG PO TABS: 88.0000 ug | ORAL_TABLET | Freq: Every day | ORAL | 0 refills | Status: DC

## 2017-10-11 NOTE — Assessment & Plan Note (Signed)
Clue cells on wet prep suggesting BV... Treat with flagyl.

## 2017-10-11 NOTE — Patient Instructions (Addendum)
Quit smoking.  Can keep taking chantix but at low dose twice daily.   Exercise, elevated feet above heart when sitting, consider compression hose. Can use a extra dose of lasix as needed for swelling if not imrpoving. Call if needing regularly.  Treat BV with vaginal metronidazole.

## 2017-10-11 NOTE — Assessment & Plan Note (Signed)
Likely related to ASA.  Nml exam.  Eval with [place] and likely bx with GYN.

## 2017-10-11 NOTE — Progress Notes (Signed)
Subjective:    Patient ID: [name], female    DOB: 1955/12/17, 62 y.o.   MRN: [id number]  HPI    62 year old female presents for follow up.  Not dure why scheduled for pap... 05/2016 nml pap with neg HPV. Does not need repeat for 5 years.   Now she has noted some spotting vaginally since CVA... She has been on  ASA 325 mg since then.  She has noted small amount bright red to dark blood on tissue off and on.  She has occ vaginal discharge and odor.. Off and on in last few weeks... Daily now in last week. Thin white discharge. Not currently sexually active, no new partners.  Used monistat and douche  Also she is trying to quit smoking. She stopped chantix given nausea. Low dose she tolerates it okay.    She is having swelling in ankles despite lasix 40 mg daily and spironolactone 100 mg   Review of Systems  Constitutional: Negative for fatigue and fever.  HENT: Negative for congestion.   Eyes: Negative for pain.  Respiratory: Negative for cough and shortness of breath.   Cardiovascular: Positive for leg swelling. Negative for chest pain and palpitations.  Gastrointestinal: Negative for abdominal pain.  Genitourinary: Negative for dysuria and vaginal bleeding.  Musculoskeletal: Negative for back pain.  Neurological: Negative for syncope, light-headedness and headaches.  Psychiatric/Behavioral: Negative for dysphoric mood.       Objective:   Physical Exam  Constitutional: Vital signs are normal. She appears well-developed and well-nourished. She is cooperative.  Non-toxic appearance. She does not appear ill. No distress.  HENT:  Head: Normocephalic.  Right Ear: Hearing, tympanic membrane, external ear and ear canal normal. Tympanic membrane is not erythematous, not retracted and not bulging.  Left Ear: Hearing, tympanic membrane, external ear and ear canal normal. Tympanic membrane is not erythematous, not retracted and not bulging.  Nose: No mucosal edema or  rhinorrhea. Right sinus exhibits no maxillary sinus tenderness and no frontal sinus tenderness. Left sinus exhibits no maxillary sinus tenderness and no frontal sinus tenderness.  Mouth/Throat: Uvula is midline, oropharynx is clear and moist and mucous membranes are normal.  Eyes: Pupils are equal, round, and reactive to light. Conjunctivae, EOM and lids are normal. Lids are everted and swept, no foreign bodies found.  Neck: Trachea normal and normal range of motion. Neck supple. Carotid bruit is not present. No thyroid mass and no thyromegaly present.  Cardiovascular: Normal rate, regular rhythm, S1 normal, S2 normal, normal heart sounds, intact distal pulses and normal pulses. Exam reveals no gallop and no friction rub.  No murmur heard. Pulmonary/Chest: Effort normal and breath sounds normal. No tachypnea. No respiratory distress. She has no decreased breath sounds. She has no wheezes. She has no rhonchi. She has no rales.  Abdominal: Soft. Normal appearance and bowel sounds are normal. There is no tenderness. Hernia confirmed negative in the right inguinal area and confirmed negative in the left inguinal area.  Genitourinary: Uterus normal. Pelvic exam was performed with patient supine. There is no rash on the right labia. There is no rash on the left labia. Uterus is not deviated, not enlarged, not fixed and not tender. Cervix exhibits no motion tenderness, no discharge and no friability. Right adnexum displays no mass, no tenderness and no fullness. Left adnexum displays no mass, no tenderness and no fullness. Vaginal discharge found.  Lymphadenopathy: No inguinal adenopathy noted on the right or left side.  Neurological: She is alert.  Skin: Skin is warm, dry and intact. No rash noted.  Psychiatric: Her speech is normal and behavior is normal. Judgment and thought content normal. Her mood appears not anxious. Cognition and memory are normal. She does not exhibit a depressed mood.            Assessment & Plan:

## 2017-10-11 NOTE — Assessment & Plan Note (Signed)
Elevated feet, compression hose.  Also given component likely due to chronic cirrhosis.. On spironolactone. Can use additional lasix prn. If using regularly will need to re-eval potassium  and consider a supplement.

## 2017-10-11 NOTE — Telephone Encounter (Signed)
Pt has appt today for pap smear---will wait as it seems pt may have been out of meds

## 2017-10-15 ENCOUNTER — Telehealth: Payer: Self-pay

## 2017-10-15 MED ORDER — METRONIDAZOLE 0.75 % VA GEL: 1.0000 | Freq: Every day | VAGINAL | 0 refills | Status: AC

## 2017-10-15 NOTE — Telephone Encounter (Signed)
Pt came by office; pt picked up metrogel from [facility] and there were only 5 applicators. I spoke with [name] at [facility] and pt received 70g which is using 1 applicator daily for 5 days. I spoke with Dr Diona Browner and she said one applicator daily for 5 days  is OK. Changed med list as instructed. Pt voiced understanding to use one applicator of med vaginally daily for 5 days.

## 2017-10-17 DIAGNOSIS — R69 Illness, unspecified: Secondary | ICD-10-CM | POA: Diagnosis not present

## 2017-10-21 ENCOUNTER — Ambulatory Visit: Admission: RE | Admit: 2017-10-21 | Payer: Medicare HMO | Source: Ambulatory Visit

## 2017-10-23 ENCOUNTER — Telehealth: Payer: Self-pay | Admitting: *Deleted

## 2017-10-23 ENCOUNTER — Other Ambulatory Visit: Payer: Self-pay | Admitting: Family Medicine

## 2017-10-23 NOTE — Telephone Encounter (Signed)
Noted  

## 2017-10-23 NOTE — Telephone Encounter (Signed)
Copied from [place] [id number]. Topic: General - Other >> Oct 23, 2017 11:54 AM Oneta Rack wrote: Caller name: Heldi  Relation to pt: exact sciences   Call back number: [phone]    Reason for call:  faxing over abnormal results to [phone].

## 2017-10-29 ENCOUNTER — Telehealth: Payer: Self-pay | Admitting: *Deleted

## 2017-10-29 DIAGNOSIS — R195 Other fecal abnormalities: Secondary | ICD-10-CM

## 2017-10-29 NOTE — Telephone Encounter (Signed)
Ms. [name] notified by telephone that her cologuard results were positive.  Advised that Dr. Diona Browner recommends referral to GI for colonoscopy.  [name] is agreeable to referral.

## 2017-10-30 DIAGNOSIS — R69 Illness, unspecified: Secondary | ICD-10-CM | POA: Diagnosis not present

## 2017-11-06 ENCOUNTER — Telehealth: Payer: Self-pay | Admitting: Gastroenterology

## 2017-11-06 NOTE — Telephone Encounter (Signed)
Returned patients call regarding scheduling her colonoscopy.  She is apprehensive with the idea of having her colonoscopy at [facility] due to a stroke she experienced on 09/15/17.  She stated that the anesthesiologist is the reason why she had a stroke.  I apologize for her having a bad experience with [facility].  I explained to her the importance of having a colonoscopy.  I informed her that I could have the one of the nurses from Endoscopy, or come into our office  to discuss the anesthesia and colonoscopy to put her at ease.   She will call me back to schedule her colonoscopy after she discusses with a friend.  She wants to proceed with a colonoscopy but wants the same doctor her friend had.  I informed her that I would be glad to get her scheduled and she thanked me and said she will call me back.  Thanks [employer]

## 2017-11-06 NOTE — Telephone Encounter (Signed)
PT left vm to schedule colonoscopy

## 2017-11-07 ENCOUNTER — Encounter: Payer: Self-pay | Admitting: *Deleted

## 2017-11-07 DIAGNOSIS — R69 Illness, unspecified: Secondary | ICD-10-CM | POA: Diagnosis not present

## 2017-11-14 DIAGNOSIS — R69 Illness, unspecified: Secondary | ICD-10-CM | POA: Diagnosis not present

## 2017-11-20 ENCOUNTER — Telehealth: Payer: Self-pay | Admitting: *Deleted

## 2017-11-20 ENCOUNTER — Telehealth: Payer: Self-pay | Admitting: Family Medicine

## 2017-11-20 DIAGNOSIS — E890 Postprocedural hypothyroidism: Secondary | ICD-10-CM

## 2017-11-20 MED ORDER — UMECLIDINIUM BROMIDE 62.5 MCG/INH IN AEPB: 1.0000 | INHALATION_SPRAY | Freq: Every day | RESPIRATORY_TRACT | 5 refills | Status: AC

## 2017-11-20 NOTE — Telephone Encounter (Signed)
Received fax from [facility] that states they work for [employer] to help their members lower their monthly drug cost. They are asking to change her Spiriva Inhaler to [employer].  Per Dr. Diona Browner it is okay to change.  New Rx sent in for the Incruse Inhaler to [facility].  Left message for Rhondalyn about change in inhaler medications.  I ask that she call us back with any questions or she has any issues with this new inhaler.  Form authorizing the change in medication faxed back to [facility] at [phone].

## 2017-11-20 NOTE — Telephone Encounter (Signed)
-----   Message from Lendon Collar, RT sent at 11/19/2017  8:28 AM EDT ----- Regarding: Lab orders for Friday Sept 6th Please enter 21month follow-up labs for 11/29/17. Thanks-Lauren

## 2017-11-23 ENCOUNTER — Other Ambulatory Visit: Payer: Self-pay | Admitting: Family Medicine

## 2017-11-26 ENCOUNTER — Other Ambulatory Visit: Payer: Self-pay | Admitting: *Deleted

## 2017-11-27 ENCOUNTER — Ambulatory Visit
Admission: RE | Admit: 2017-11-27 | Discharge: 2017-11-27 | Disposition: A | Discharge status: Home / Self Care | Payer: Medicare HMO | Source: Ambulatory Visit | Attending: Internal Medicine | Admitting: Internal Medicine

## 2017-11-27 ENCOUNTER — Telehealth: Payer: Self-pay | Admitting: Internal Medicine

## 2017-11-27 DIAGNOSIS — R911 Solitary pulmonary nodule: Secondary | ICD-10-CM | POA: Insufficient documentation

## 2017-11-27 LAB — GLUCOSE, CAPILLARY: Glucose-Capillary: 91 mg/dL (ref 70–99)

## 2017-11-27 NOTE — Telephone Encounter (Signed)
Patient was unable to complete PET scan and was r/s for 9/6 Patient will need to r/s follow up with Dr. Juanell Fairly LMOV to call and r/s

## 2017-11-28 ENCOUNTER — Telehealth: Payer: Self-pay

## 2017-11-28 DIAGNOSIS — R69 Illness, unspecified: Secondary | ICD-10-CM | POA: Diagnosis not present

## 2017-11-28 NOTE — Telephone Encounter (Signed)
No other labs needed... okay to labs where ever she wants as long as okay with facility.

## 2017-11-28 NOTE — Telephone Encounter (Signed)
Copied from [place]. Topic: General - Other >> Nov 28, 2017  1:40 PM Sheran Luz wrote: CRM for notification. See Telephone encounter for: 11/28/17.  Judson Roch from [facility] radiology dept called inquiring if the labs pt is scheduled for on 9/6 could be done at the hospital while pt is at [facility], stating that her appointment there will be at least 2 hours. Judson Roch also wanted to make sure that the labs the pt needs are documented in the system and there are no additional labs needed. Please advise.

## 2017-11-28 NOTE — Telephone Encounter (Signed)
[name] at [facility] notified by telephone that it is okay for [name] to have her labs drawn there tomorrow and no additional labs needed other that her thyroid test.

## 2017-11-28 NOTE — Telephone Encounter (Signed)
The future lab orders in pt chart is TSH, T4 free, and T3 free.

## 2017-11-29 ENCOUNTER — Ambulatory Visit: Payer: Medicare HMO | Admitting: Internal Medicine

## 2017-11-29 ENCOUNTER — Encounter: Payer: Medicare HMO | Attending: Internal Medicine

## 2017-11-29 ENCOUNTER — Other Ambulatory Visit (INDEPENDENT_AMBULATORY_CARE_PROVIDER_SITE_OTHER): Payer: Medicare HMO

## 2017-11-29 DIAGNOSIS — E890 Postprocedural hypothyroidism: Secondary | ICD-10-CM

## 2017-11-29 LAB — TSH: TSH: 0.21 u[IU]/mL — AB (ref 0.35–4.50)

## 2017-11-29 LAB — T4, FREE: FREE T4: 1.26 ng/dL (ref 0.60–1.60)

## 2017-11-29 LAB — T3, FREE: T3, Free: 2.9 pg/mL (ref 2.3–4.2)

## 2017-12-02 NOTE — Telephone Encounter (Signed)
Lmov to call and reschedule appointment

## 2017-12-10 NOTE — Telephone Encounter (Signed)
Lmov to call and reschedule appointment

## 2017-12-11 ENCOUNTER — Encounter: Payer: Self-pay | Admitting: Cardiology

## 2017-12-11 NOTE — Telephone Encounter (Signed)
Unable to contact   Mailed Letter

## 2017-12-12 DIAGNOSIS — R69 Illness, unspecified: Secondary | ICD-10-CM | POA: Diagnosis not present

## 2017-12-17 ENCOUNTER — Other Ambulatory Visit: Payer: Self-pay | Admitting: Gastroenterology

## 2017-12-17 DIAGNOSIS — K7460 Unspecified cirrhosis of liver: Secondary | ICD-10-CM | POA: Diagnosis not present

## 2017-12-17 DIAGNOSIS — R195 Other fecal abnormalities: Secondary | ICD-10-CM | POA: Diagnosis not present

## 2017-12-18 ENCOUNTER — Other Ambulatory Visit: Payer: Self-pay | Admitting: Gastroenterology

## 2017-12-18 DIAGNOSIS — K7460 Unspecified cirrhosis of liver: Secondary | ICD-10-CM

## 2017-12-19 DIAGNOSIS — K7460 Unspecified cirrhosis of liver: Secondary | ICD-10-CM | POA: Diagnosis not present

## 2017-12-23 ENCOUNTER — Other Ambulatory Visit: Payer: Self-pay | Admitting: Gastroenterology

## 2017-12-23 DIAGNOSIS — R772 Abnormality of alphafetoprotein: Secondary | ICD-10-CM

## 2017-12-24 ENCOUNTER — Ambulatory Visit: Payer: Medicare HMO

## 2017-12-26 DIAGNOSIS — R69 Illness, unspecified: Secondary | ICD-10-CM | POA: Diagnosis not present

## 2017-12-31 ENCOUNTER — Ambulatory Visit (INDEPENDENT_AMBULATORY_CARE_PROVIDER_SITE_OTHER): Payer: Medicare HMO | Admitting: Family Medicine

## 2017-12-31 ENCOUNTER — Encounter: Payer: Self-pay | Admitting: Family Medicine

## 2017-12-31 VITALS — BP 90/60 | HR 60 | Temp 97.5°F | Ht 63.0 in | Wt 158.8 lb

## 2017-12-31 DIAGNOSIS — B182 Chronic viral hepatitis C: Secondary | ICD-10-CM

## 2017-12-31 DIAGNOSIS — K7469 Other cirrhosis of liver: Secondary | ICD-10-CM

## 2017-12-31 DIAGNOSIS — K5909 Other constipation: Secondary | ICD-10-CM | POA: Diagnosis not present

## 2017-12-31 DIAGNOSIS — B1920 Unspecified viral hepatitis C without hepatic coma: Secondary | ICD-10-CM | POA: Diagnosis not present

## 2017-12-31 DIAGNOSIS — Z23 Encounter for immunization: Secondary | ICD-10-CM

## 2017-12-31 DIAGNOSIS — R748 Abnormal levels of other serum enzymes: Secondary | ICD-10-CM

## 2017-12-31 NOTE — Progress Notes (Signed)
Subjective:    Patient ID: [name], female    DOB: 1955/10/17, 62 y.o.   MRN: [id number]  HPI   62 year old female presents  For follow up multiple issues.   Seeing GI for hepatitis C and resulting cirrhosis. Followed at [facility] hepatology. HCV was treated w/ harvoni + ribavirin and now sober   Recent labs showed:  Elevated ALK phos ( went from 17 to 176) and  MRCP recommended ( on 10/22)   Per [id number] Cologuard-needs colonoscopy  (planned 11/21). No prior colonoscopy. Only lower GI symptoms is chronic constipation which is overall unchanged. I did prescribe MiraLAX to use daily, did review the importance of improving constipation prior to colonoscopy will likely have a poor prep.    She reports miralax BID not helping with constipation.  Has also tried exlax. NO relief.  Having BM 2 times a week, feels like she has to have BM but does not go. Firm, hard.. straining to get BM out. Comes out in a ball. She drinks a lot of water and eats high fiber foods.   She has noted some decreased appetite in last 5 months since CVA. She has noted some east bruising. (platelets low 84 on 11/18/2017, stable from 2018 likely from liver issues). Feels bloated,, in last few months. No weight loss. Wt Readings from Last 3 Encounters:  12/31/17 158 lb 12 oz (72 kg)  10/11/17 158 lb (71.7 kg)  09/05/17 157 lb 8 oz (71.4 kg)     Social History /Family History/Past Medical History reviewed in detail and updated in EMR if needed. Blood pressure 90/60, pulse 60, temperature (!) 97.5 F (36.4 C), temperature source Oral, height 5' 3" (1.6 m), weight 158 lb 12 oz (72 kg).  Review of Systems  Constitutional: Negative for fatigue and fever.  HENT: Negative for congestion.   Eyes: Negative for pain.  Respiratory: Negative for cough and shortness of breath.   Cardiovascular: Negative for chest pain, palpitations and leg swelling.  Gastrointestinal: Negative for abdominal pain.  Genitourinary: Negative  for dysuria and vaginal bleeding.  Musculoskeletal: Negative for back pain.  Neurological: Negative for syncope, light-headedness and headaches.  Psychiatric/Behavioral: Negative for dysphoric mood.       Objective:   Physical Exam  Constitutional: Vital signs are normal. She appears well-developed and well-nourished. She is cooperative.  Non-toxic appearance. She does not appear ill. No distress.  HENT:  Head: Normocephalic.  Right Ear: Hearing, tympanic membrane, external ear and ear canal normal.  Left Ear: Hearing, tympanic membrane, external ear and ear canal normal.  Nose: Nose normal.  Eyes: Pupils are equal, round, and reactive to light. Conjunctivae, EOM and lids are normal. Lids are everted and swept, no foreign bodies found.  Neck: Trachea normal and normal range of motion. Neck supple. Carotid bruit is not present. No thyroid mass and no thyromegaly present.  Cardiovascular: Normal rate, regular rhythm, S1 normal, S2 normal, normal heart sounds and intact distal pulses. Exam reveals no gallop.  No murmur heard. Pulmonary/Chest: Effort normal and breath sounds normal. No respiratory distress. She has no wheezes. She has no rhonchi. She has no rales.  Abdominal: Soft. Normal appearance and bowel sounds are normal. She exhibits distension. She exhibits no fluid wave, no abdominal bruit and no mass. There is no hepatosplenomegaly. There is tenderness in the right upper quadrant and epigastric area. There is no rebound, no guarding and no CVA tenderness. No hernia.  Lymphadenopathy:    She has no  cervical adenopathy.    She has no axillary adenopathy.  Neurological: She is alert. She has normal strength. No cranial nerve deficit or sensory deficit.  Skin: Skin is warm, dry and intact. No rash noted.  Psychiatric: Her speech is normal and behavior is normal. Judgment normal. Her mood appears not anxious. Cognition and memory are normal. She does not exhibit a depressed mood.           Assessment & Plan:   

## 2017-12-31 NOTE — Assessment & Plan Note (Signed)
HAs upcoming MRCP to eval source with GI.

## 2017-12-31 NOTE — Assessment & Plan Note (Signed)
Followed at [facility] 

## 2017-12-31 NOTE — Assessment & Plan Note (Signed)
Not new... Has upcoming colonoscopy to eval. Miralax and senna unhelpful. Add MOM and possibly enema. INcrease fiber , water and exercise.

## 2017-12-31 NOTE — Addendum Note (Signed)
Addended by: Carter Kitten on: 12/31/2017 12:04 PM   Modules accepted: Orders

## 2017-12-31 NOTE — Patient Instructions (Signed)
Keep up with water intake. Try to higher fiber foods.  Keep up with daily exercise.  Continue miralax. Add milk of magnesia, if No BM... Follow with Fleets enema.

## 2018-01-02 DIAGNOSIS — R69 Illness, unspecified: Secondary | ICD-10-CM | POA: Diagnosis not present

## 2018-01-06 ENCOUNTER — Other Ambulatory Visit: Payer: Self-pay | Admitting: Family Medicine

## 2018-01-07 ENCOUNTER — Ambulatory Visit: Payer: Self-pay

## 2018-01-07 DIAGNOSIS — H2513 Age-related nuclear cataract, bilateral: Secondary | ICD-10-CM | POA: Diagnosis not present

## 2018-01-07 NOTE — Telephone Encounter (Signed)
Attempted to call left message for return call

## 2018-01-08 ENCOUNTER — Ambulatory Visit: Payer: Medicare HMO

## 2018-01-10 ENCOUNTER — Ambulatory Visit (INDEPENDENT_AMBULATORY_CARE_PROVIDER_SITE_OTHER): Payer: Medicare HMO | Admitting: Family Medicine

## 2018-01-10 VITALS — BP 100/60 | HR 75 | Temp 98.3°F | Ht 63.0 in | Wt 160.0 lb

## 2018-01-10 DIAGNOSIS — N898 Other specified noninflammatory disorders of vagina: Secondary | ICD-10-CM | POA: Diagnosis not present

## 2018-01-10 DIAGNOSIS — R059 Cough, unspecified: Secondary | ICD-10-CM

## 2018-01-10 DIAGNOSIS — R05 Cough: Secondary | ICD-10-CM

## 2018-01-10 MED ORDER — FLUCONAZOLE 150 MG PO TABS: 150.0000 mg | ORAL_TABLET | Freq: Once | ORAL | 0 refills | Status: AC

## 2018-01-10 MED ORDER — BENZONATATE 200 MG PO CAPS: 200.0000 mg | ORAL_CAPSULE | Freq: Two times a day (BID) | ORAL | 0 refills | Status: DC | PRN

## 2018-01-10 NOTE — Patient Instructions (Signed)
Likely viral respiratory infection... Use benzonatate for cough.  Rest fluids.  Expect improvement in next 5-7 days.. Call if not improving as expected.  Treat vaginal yeast with fluconazole.

## 2018-01-10 NOTE — Progress Notes (Signed)
Subjective:    Patient ID: [name], female    DOB: October 24, 1955, 62 y.o.   MRN: [id number]  HPI  62 year old female presents for ? pap smear and multiple other issues  Nml pap, neg HPV  05/2016   She reports vaginal discharge,  White to green thick ongoing  Off and on in last 1-2 months  Intermittant rash on inner thighs in last few weeks. Not ithcy, feels burning.  She has been applying vaseline. Monistat did not help.  She has tried douching.  She  reports she got flu shot on 10/8. 1 week ago started with chills, did not check fever.  She has been having body aches, cough, productive . Hoarse voice, ST. Post tussive emesis.   Last night spontaneous emesis, no blood in emesis.  Mild intermittant SOB .Marland Kitchen occ hearing crackling.   No improvement with OTC meds, tea /honey. Blood pressure 100/60, pulse 75, temperature 98.3 F (36.8 C), temperature source Oral, height 5\' 3"  (1.6 m), weight 160 lb (72.6 kg). Social History /Family History/Past Medical History reviewed in detail and updated in EMR if needed.  Review of Systems  Constitutional: Positive for chills and fatigue. Negative for fever.  HENT: Positive for congestion.   Eyes: Negative for pain.  Respiratory: Positive for cough. Negative for shortness of breath.   Cardiovascular: Negative for chest pain, palpitations and leg swelling.  Gastrointestinal: Negative for abdominal pain.  Genitourinary: Negative for dysuria and vaginal bleeding.  Musculoskeletal: Negative for back pain.  Neurological: Negative for syncope, light-headedness and headaches.  Psychiatric/Behavioral: Negative for dysphoric mood.       Objective:   Physical Exam  Constitutional: Vital signs are normal. She appears well-developed and well-nourished. She is cooperative.  Non-toxic appearance. She appears ill. She appears distressed.  In pain and uncomfotable  HENT:  Head: Normocephalic.  Right Ear: Hearing, tympanic membrane, external ear and ear  canal normal. Tympanic membrane is not erythematous, not retracted and not bulging.  Left Ear: Hearing, tympanic membrane, external ear and ear canal normal. Tympanic membrane is not erythematous, not retracted and not bulging.  Nose: No mucosal edema or rhinorrhea. Right sinus exhibits no maxillary sinus tenderness and no frontal sinus tenderness. Left sinus exhibits no maxillary sinus tenderness and no frontal sinus tenderness.  Mouth/Throat: Uvula is midline, oropharynx is clear and moist and mucous membranes are normal.  Eyes: Pupils are equal, round, and reactive to light. Conjunctivae, EOM and lids are normal. Lids are everted and swept, no foreign bodies found.  Neck: Trachea normal and normal range of motion. Neck supple. Carotid bruit is not present. No thyroid mass and no thyromegaly present.  Cardiovascular: Normal rate, regular rhythm, S1 normal, S2 normal, normal heart sounds, intact distal pulses and normal pulses. Exam reveals no gallop and no friction rub.  No murmur heard. Pulmonary/Chest: Effort normal and breath sounds normal. No tachypnea. No respiratory distress. She has no decreased breath sounds. She has no wheezes. She has no rhonchi. She has no rales.  Abdominal: Soft. Normal appearance and bowel sounds are normal. There is no tenderness.  Genitourinary: There is no rash, tenderness or lesion on the right labia. There is no rash, tenderness, lesion or injury on the left labia. There is erythema in the vagina. Vaginal discharge found.  Neurological: She is alert.  Skin: Skin is warm, dry and intact. No rash noted.  Psychiatric: Her speech is normal and behavior is normal. Judgment and thought content normal. Her mood appears  not anxious. Cognition and memory are normal. She does not exhibit a depressed mood.          Assessment & Plan:

## 2018-01-14 ENCOUNTER — Ambulatory Visit: Payer: Medicare HMO

## 2018-01-22 ENCOUNTER — Other Ambulatory Visit: Payer: Self-pay | Admitting: Family Medicine

## 2018-01-22 ENCOUNTER — Ambulatory Visit
Admission: RE | Admit: 2018-01-22 | Discharge: 2018-01-22 | Disposition: A | Discharge status: Home / Self Care | Payer: Medicare HMO | Source: Ambulatory Visit | Attending: Gastroenterology | Admitting: Gastroenterology

## 2018-01-22 DIAGNOSIS — C220 Liver cell carcinoma: Secondary | ICD-10-CM | POA: Insufficient documentation

## 2018-01-22 DIAGNOSIS — R933 Abnormal findings on diagnostic imaging of other parts of digestive tract: Secondary | ICD-10-CM | POA: Diagnosis not present

## 2018-01-22 DIAGNOSIS — I700 Atherosclerosis of aorta: Secondary | ICD-10-CM | POA: Insufficient documentation

## 2018-01-22 DIAGNOSIS — K7460 Unspecified cirrhosis of liver: Secondary | ICD-10-CM | POA: Insufficient documentation

## 2018-01-22 DIAGNOSIS — R772 Abnormality of alphafetoprotein: Secondary | ICD-10-CM | POA: Diagnosis not present

## 2018-01-22 MED ORDER — GADOBUTROL 1 MMOL/ML IV SOLN
7.5000 mL | Freq: Once | INTRAVENOUS | Status: AC | PRN
  Administered 2018-01-22: 7.5 mL via INTRAVENOUS

## 2018-01-22 NOTE — Telephone Encounter (Signed)
Last office visit 01/10/2018 for cough.  Last refilled 10/11/2017 for #60 with 2 refills.  Ok to refill?

## 2018-01-23 ENCOUNTER — Telehealth: Payer: Self-pay

## 2018-01-23 DIAGNOSIS — R772 Abnormality of alphafetoprotein: Secondary | ICD-10-CM | POA: Diagnosis not present

## 2018-01-23 DIAGNOSIS — R195 Other fecal abnormalities: Secondary | ICD-10-CM | POA: Diagnosis not present

## 2018-01-23 DIAGNOSIS — C220 Liver cell carcinoma: Secondary | ICD-10-CM | POA: Diagnosis not present

## 2018-01-23 DIAGNOSIS — R69 Illness, unspecified: Secondary | ICD-10-CM | POA: Diagnosis not present

## 2018-01-23 NOTE — Telephone Encounter (Signed)
Call placed to Ms. [name] at the request of [facility]. Ms. [name] was given diagnosis of [place] today. [facility] has placed referral. We will arrange for medical oncology appointment and call her in the am with the details.  Oncology Nurse Navigator Documentation  Navigator Location: [facility]-Med Onc (01/23/18 1600)   )Navigator Encounter Type: Telephone (01/23/18 1600) Telephone: [place] Call (01/23/18 1600)                                                  Time Spent with Patient: 15 (01/23/18 1600)

## 2018-01-26 DIAGNOSIS — C220 Liver cell carcinoma: Secondary | ICD-10-CM | POA: Insufficient documentation

## 2018-01-26 NOTE — Progress Notes (Signed)
[place]  Telephone:([phone]) [phone] Fax:(336) [phone]  ID: [name] OB: 06/07/1955  MR#: [id number]  RKY#:[id number]  Patient Care Team: Jinny Sanders, MD as PCP - General (Family Medicine) Clent Jacks, RN as Registered Nurse  CHIEF COMPLAINT: Hepatocellular carcinoma  INTERVAL HISTORY: Patient is a 62 year old female who has cirrhosis secondary to history of alcoholism and hepatitis C who was recently noted to have enlarging multifocal lesions in her liver.  AFP also significantly increased to 176.  Lesions were noted on imaging at [facility] several years ago but remained relatively unchanged in size and her AFP remained only minimally elevated.  Only on recent imaging the day significantly enlarged.  She is anxious, but otherwise feels well.  She has no neurologic complaints.  She denies any recent fevers or illnesses.  She has good appetite and denies weight loss.  She has no chest pain or shortness of breath.  She denies any nausea, vomiting, constipation, or diarrhea.  She has occasional abdominal bloating secondary to ascites, but denies any tenderness.  She has no urinary complaints.  Patient offers no specific complaints.  REVIEW OF SYSTEMS:   Review of Systems  Constitutional: Negative.  Negative for fever, malaise/fatigue and weight loss.  Respiratory: Negative.  Negative for cough, hemoptysis and shortness of breath.   Cardiovascular: Negative.  Negative for chest pain and leg swelling.  Gastrointestinal: Negative.  Negative for abdominal pain, blood in stool and melena.  Genitourinary: Negative.  Negative for dysuria.  Musculoskeletal: Negative.  Negative for back pain.  Skin: Negative.  Negative for rash.  Neurological: Negative.  Negative for dizziness, focal weakness, weakness and headaches.  Psychiatric/Behavioral: The patient is nervous/anxious.     As per HPI. Otherwise, a complete review of systems is negative.  PAST MEDICAL HISTORY: Past  Medical History:  Diagnosis Date  . Acquired hypothyroidism    thyroid goiter s/p thyroidectomy  . Allergy   . Bipolar 1 disorder ([place]) 1990s  . Chronic hepatitis C (HCC)    genotype 1a  . Compensated HCV cirrhosis ([place])   . Complication of anesthesia    states 15 yrs ago, hard time waking up, states cant breathe when coming out of anesthesia  . Complication of anesthesia    per patient heart stopped  . COPD (chronic obstructive pulmonary disease) ([place])   . Depression    states seen weekly at [facility]  . GERD (gastroesophageal reflux disease)    Hpylori + and treated (2012)  . History of alcohol abuse   . Insomnia   . Migraines   . Pneumonia   . PONV (postoperative nausea and vomiting)    "it all depends on what kind they give me"  . Portal hypertension ([place])   . Portal hypertensive gastropathy ([place]) 2013  . Smoker   . Stroke ([place])   . TIA (transient ischemic attack)   . Varicose veins     PAST SURGICAL HISTORY: Past Surgical History:  Procedure Laterality Date  . APPENDECTOMY    . DIAGNOSTIC LAPAROSCOPY     15 yrs ago  . ELECTROMAGNETIC NAVIGATION BROCHOSCOPY N/A 08/15/2017   Procedure: ELECTROMAGNETIC NAVIGATION BRONCHOSCOPY;  Surgeon: Flora Lipps, MD;  Location: [facility] ORS;  Service: Cardiopulmonary;  Laterality: N/A;  . ESOPHAGOGASTRODUODENOSCOPY  10/2010   nl esophagus, HH, portal hypertensive gastropathy, erosive gastropathy, Hpylori + (Dr. Sydnee Levans)  . foot surgery Bilateral 2017   Callus removal  . left foot surgery  1990s  . LIVER BIOPSY    .  THYROIDECTOMY  05/17/2011   Procedure: TOTAL THYROIDECTOMY;  Surgeon: Earnstine Regal, MD, benign goiter per path report    FAMILY HISTORY: Family History  Problem Relation Age of Onset  . Heart disease Mother        pacemaker  . Cancer Father        bladder/melanoma  . Diabetes Father   . Hypertension Father   . Melanoma Sister   . Breast cancer Neg Hx     ADVANCED DIRECTIVES (Y/N):  N  HEALTH  MAINTENANCE: Social History   Tobacco Use  . Smoking status: Current Some Day Smoker    Packs/day: 0.50    Years: 40.00    Pack years: 20.00    Types: Cigarettes  . Smokeless tobacco: Former Systems developer    Types: Chew  . Tobacco comment: decreasing use 05/30/2011, taking chantix   Substance Use Topics  . Alcohol use: No  . Drug use: No     Colonoscopy:  PAP:  Bone density:  Lipid panel:  Allergies  Allergen Reactions  . Demerol Anaphylaxis  . Penicillins Anaphylaxis, Hives and Itching    Has patient had a PCN reaction causing immediate rash, facial/tongue/throat swelling, SOB or lightheadedness with hypotension: Yes Has patient had a PCN reaction causing severe rash involving mucus membranes or skin necrosis: Yes Has patient had a PCN reaction that required hospitalization: Yes Has patient had a PCN reaction occurring within the last 10 years: No If all of the above answers are "NO", then may proceed with Cephalosporin use.   . Other Other (See Comments)    general anesthesia - heart stopped   . Propofol Other (See Comments)    Allergic, caused her to code   . Latex Rash  . Oxycodone Hcl Rash    Current Outpatient Medications  Medication Sig Dispense Refill  . aspirin EC 81 MG tablet Take 1 tablet by mouth daily.    Marland Kitchen atorvastatin (LIPITOR) 10 MG tablet TAKE 1 TABLET BY MOUTH EVERY NIGHT AT BEDTIME 90 tablet 1  . benzonatate (TESSALON) 200 MG capsule Take 1 capsule (200 mg total) by mouth 2 (two) times daily as needed for cough. 20 capsule 0  . CHANTIX 0.5 MG tablet TAKE 1 TABLET BY MOUTH 2 TIMES DAILY 60 tablet 2  . furosemide (LASIX) 20 MG tablet Take 40 mg by mouth daily.     Marland Kitchen ibuprofen (ADVIL,MOTRIN) 200 MG tablet Take 200-400 mg by mouth daily as needed for headache or moderate pain.    Marland Kitchen lamoTRIgine (LAMICTAL) 25 MG tablet Take 25 mg by mouth daily.     . QUEtiapine (SEROQUEL) 300 MG tablet Take 300 mg by mouth at bedtime as needed (sleep).     Marland Kitchen spironolactone  (ALDACTONE) 25 MG tablet Take 100 mg by mouth daily.     Marland Kitchen SYNTHROID 88 MCG tablet TAKE 1 TABLET BY MOUTH ONCE DAILY 90 tablet 3  . traZODone (DESYREL) 100 MG tablet Take 100 mg by mouth at bedtime as needed for sleep.     Marland Kitchen umeclidinium bromide (INCRUSE ELLIPTA) 62.5 MCG/INH AEPB Inhale 1 puff into the lungs daily. 30 each 5  . lidocaine-prilocaine (EMLA) cream Apply 1 application topically as needed. (Patient not taking: Reported on 01/27/2018) 30 g 2  . SORAfenib (NEXAVAR) 200 MG tablet Take 2 tablets (400 mg total) by mouth 2 (two) times daily. Give on an empty stomach 1 hour before or 2 hours after meals. 120 tablet 2   No current facility-administered medications for  this visit.     OBJECTIVE: Vitals:   01/27/18 1118  BP: 112/76  Pulse: 73  Resp: 18  Temp: 97.6 F (36.4 C)     Body mass index is 28.05 kg/m.    ECOG FS:0 - Asymptomatic  General: Well-developed, well-nourished, no acute distress. Eyes: Pink conjunctiva, anicteric sclera. HEENT: Normocephalic, moist mucous membranes, clear oropharnyx. Lungs: Clear to auscultation bilaterally. Heart: Regular rate and rhythm. No rubs, murmurs, or gallops. Abdomen: Soft, nontender, nondistended. No organomegaly noted, normoactive bowel sounds. Musculoskeletal: No edema, cyanosis, or clubbing. Neuro: Alert, answering all questions appropriately. Cranial nerves grossly intact. Skin: No rashes or petechiae noted. Psych: Normal affect. Lymphatics: No cervical, calvicular, axillary or inguinal LAD.   LAB RESULTS:  Lab Results  Component Value Date   NA 134 (L) 08/15/2017   K 4.3 08/15/2017   CL 104 08/15/2017   CO2 22 08/15/2017   GLUCOSE 157 (H) 08/15/2017   BUN 14 08/15/2017   CREATININE 0.98 08/15/2017   CALCIUM 8.6 (L) 08/15/2017   PROT 7.2 08/15/2017   ALBUMIN 3.5 08/15/2017   AST 30 08/15/2017   ALT 20 08/15/2017   ALKPHOS 94 08/15/2017   BILITOT 1.6 (H) 08/15/2017   GFRNONAA >60 08/15/2017   GFRAA >60 08/15/2017      Lab Results  Component Value Date   WBC 7.6 08/15/2017   NEUTROABS 6.8 08/15/2017   HGB 13.7 08/15/2017   HCT 40.0 08/15/2017   MCV 92.0 08/15/2017   PLT 86 (L) 08/15/2017     STUDIES: Mr Abdomen Mrcp W Wo Contast  Result Date: 01/22/2018 CLINICAL DATA:  Elevated alpha fetoprotein levels. History of cirrhosis. EXAM: MRI ABDOMEN WITHOUT AND WITH CONTRAST (INCLUDING MRCP) TECHNIQUE: Multiplanar multisequence MR imaging of the abdomen was performed both before and after the administration of intravenous contrast. Heavily T2-weighted images of the biliary and pancreatic ducts were obtained, and three-dimensional MRCP images were rendered by post processing. CONTRAST:  7.5 cc of Gadavist. COMPARISON:  None. FINDINGS: Lower chest: No acute findings. Hepatobiliary: The liver is diffusely nodular compatible with cirrhosis. There are innumerable arterial phase enhancing lesions within both lobes of liver compatible with multifocal hepatocellular carcinoma. Index lesion in segment 8 measures 2.2 cm, image 16/17. This has hyperenhancement with pseudo capsule and washout on the delayed images diagnostic of [place]. Large confluent mass within segment 5 measures 5.8 x 3.5 by 5.9 cm, image 61/17. This exhibits early hyperenhancement and delayed internal washout, also consistent with [place]. Lesion within the lateral segment of left lobe measures 1.1 cm and has early hyperenhancement and delayed pseudo capsule and washout, image 20/17. Also diagnostic of [place]. Gallbladder normal. No gallbladder wall thickening. No biliary dilatation. Pancreas: No mass, inflammatory changes, or other parenchymal abnormality identified. Spleen:  The spleen is enlarged with a length of 13.6 cm. Adrenals/Urinary Tract: Normal appearance of the adrenal glands. The right kidney is unremarkable. Tiny cyst noted within the inferior pole of left kidney. No mass or hydronephrosis. Stomach/Bowel: Visualized portions within the abdomen are  unremarkable. Vascular/Lymphatic: Aortic atherosclerosis without aneurysm. Distal esophageal and gastric varices. The main portal vein appears patent. There may be partial cavernous transformation of the left branch of the portal vein. Prominent aortocaval node measures 8 mm.  No adenopathy. Other:  Trace perihepatic ascites. Musculoskeletal: No suspicious bone lesions identified. IMPRESSION: 1. Exam positive for cirrhosis with multifocal hepatocellular carcinoma involving both lobes of liver. 2. Stigmata of portal venous hypertension including esophageal varices and splenomegaly 3.  Aortic Atherosclerosis (ICD10-I70.0). Electronically  Signed   By: Kerby Moors M.D.   On: 01/22/2018 10:53    ASSESSMENT: At least stage IIIa hepatocellular carcinoma  PLAN:    1. Hepatocellular carcinoma: MRI results from January 22, 2018 reviewed independently and reported as above consistent with hepatocellular carcinoma.  Given patient's history of cirrhosis and an elevated AFP of 176, this is pathognomonic and patient does not require biopsy.  AFP was ordered for today, but not drawn by laboratory.  Will repeat at next clinic visit.  Will get PET scan to complete staging work-up.  Given the multifocal nature of patient's disease, patient will be initiated on sorafenib 400 mg twice per day.  We will also discuss patient at cancer conference later this week to see if any lesions are amenable to ablation.  Return to clinic in 1 month with repeat laboratory work, assessment of her toleration of sorafenib, and further evaluation.  I spent a total of 60 minutes face-to-face with the patient of which greater than 50% of the visit was spent in counseling and coordination of care as detailed above.   Patient expressed understanding and was in agreement with this plan. She also understands that She can call clinic at any time with any questions, concerns, or complaints.   Cancer Staging Hepatocellular carcinoma  [facility]) Staging form: Liver, AJCC 8th Edition - Clinical stage from 01/28/2018: Stage IIIA (cT3, cN0, cM0) - Signed by Lloyd Huger, MD on 01/28/2018   Lloyd Huger, MD   01/28/2018 6:32 AM

## 2018-01-27 ENCOUNTER — Telehealth: Payer: Self-pay | Admitting: *Deleted

## 2018-01-27 ENCOUNTER — Telehealth: Payer: Self-pay | Admitting: Pharmacy Technician

## 2018-01-27 ENCOUNTER — Other Ambulatory Visit: Payer: Self-pay

## 2018-01-27 ENCOUNTER — Inpatient Hospital Stay: Payer: Medicare HMO | Attending: Oncology | Admitting: Oncology

## 2018-01-27 ENCOUNTER — Telehealth: Payer: Self-pay | Admitting: Pharmacist

## 2018-01-27 ENCOUNTER — Encounter: Payer: Self-pay | Admitting: Oncology

## 2018-01-27 ENCOUNTER — Inpatient Hospital Stay: Payer: Medicare HMO

## 2018-01-27 DIAGNOSIS — Z7982 Long term (current) use of aspirin: Secondary | ICD-10-CM

## 2018-01-27 DIAGNOSIS — C220 Liver cell carcinoma: Secondary | ICD-10-CM | POA: Diagnosis not present

## 2018-01-27 DIAGNOSIS — Z79899 Other long term (current) drug therapy: Secondary | ICD-10-CM | POA: Diagnosis not present

## 2018-01-27 DIAGNOSIS — F17210 Nicotine dependence, cigarettes, uncomplicated: Secondary | ICD-10-CM

## 2018-01-27 DIAGNOSIS — R69 Illness, unspecified: Secondary | ICD-10-CM | POA: Diagnosis not present

## 2018-01-27 MED ORDER — SORAFENIB TOSYLATE 200 MG PO TABS: 400.0000 mg | ORAL_TABLET | Freq: Two times a day (BID) | ORAL | 2 refills | Status: DC

## 2018-01-27 NOTE — Telephone Encounter (Signed)
Patient called stating that she is still having a discharge and the medication given did not help at all. Patient stated that the discharge seems to be coming from her rectum. Patient stated that the discharge is causing a rash also. Patient stated that she just found out that she has liver cancer and has an appointment scheduled today with an oncologist. Patient stated that she does not know if this is associated with her cancer or not.  Pharmacy [facility]

## 2018-01-27 NOTE — Progress Notes (Signed)
Met with Ms. Nilsen along with Dr. Grayland Ormond . Introduced Therapist, nutritional and provided contact information for future needs. She reports early satiety reporting usually eating only one very small meal a day. Reports chronic abdominal bloating and constipation. Denies any weight loss. We can refer to dietician. Request sent to [facility] imaging to power share MRI from 09-30-15 and 04-17-16. Oncology Nurse Navigator Documentation  Navigator Location: [facility]Med Onc (01/27/18 1200)   )Navigator Encounter Type: Initial MedOnc (01/27/18 1200)                     Patient Visit Type: MedOnc;Initial (01/27/18 1200)                              Time Spent with Patient: 15 (01/27/18 1200)

## 2018-01-27 NOTE — Telephone Encounter (Signed)
Have her discuss with the MD she is seeing today.. If they offer no assistance have her make an appt for re-eval with me this week.

## 2018-01-27 NOTE — Telephone Encounter (Signed)
Oral Oncology Pharmacist Encounter  Received new prescription for Nexavar (sorafenib) for the treatment of [place], planned duration until disease progression or unacceptable drug toxicity.  CMP and BP from 01/27/18 assessed, no relevant lab abnormalities and BP well controlled. Prescription dose and frequency assessed.   Current medication list in Epic reviewed, no relevant DDIs with sorafenib identified.  Prescription has been e-scribed to the [facility] for benefits analysis and approval.  Patient education Counseled patient on administration, dosing, side effects, monitoring, drug-food interactions, safe handling, storage, and disposal. Patient will take 2 tablets (400 mg total) by mouth 2 (two) times daily. Give on an empty stomach 1 hour before or 2 hours after meals.  Side effects include but not limited to: rash, hand-foot syndrome, decreased hgb/wbc/plt, diarrhea.    Reviewed with patient importance of keeping a medication schedule and plan for any missed doses.  Ms. Hagarty voiced understanding and appreciation. All questions answered. Medication handout provided and consent obtained.  Oral Oncology Clinic will continue to follow for insurance authorization, copayment issues, and start date.  Provided patient with Oral [place] [facility] phone number. Patient knows to call the office with questions or concerns. Oral Chemotherapy Navigation Clinic will continue to follow.  Darl Pikes, PharmD, BCPS, [facility] Hematology/Oncology Clinical Pharmacist [facility]/HP/AP Oral [facility] [phone]  01/27/2018 12:25 PM

## 2018-01-27 NOTE — Telephone Encounter (Signed)
Willine notified as instructed by telephone.  Patient states understanding. 

## 2018-01-27 NOTE — Progress Notes (Signed)
New patient visit for liver cell carcinoma.  Pt verbalized having issues with rash between thighs and a white discharge from rectum in last month.  Primary MD gave diflucan last week, patient completed and still having difficulties.

## 2018-01-27 NOTE — Telephone Encounter (Signed)
Oral Oncology Patient Advocate Encounter  Received notification from [facility] that prior authorization for Nexavar is required.  PA submitted on CoverMyMeds Key [id number]  Status is pending  Oral Oncology Clinic will continue to follow.  [place] Patient [place] Phone [phone] Fax [phone] 01/27/2018 4:01 PM

## 2018-01-28 ENCOUNTER — Ambulatory Visit
Admission: RE | Admit: 2018-01-28 | Discharge: 2018-01-28 | Disposition: A | Discharge status: Home / Self Care | Payer: Self-pay | Source: Ambulatory Visit | Attending: Oncology | Admitting: Oncology

## 2018-01-28 ENCOUNTER — Other Ambulatory Visit: Payer: Self-pay | Admitting: Oncology

## 2018-01-28 DIAGNOSIS — C220 Liver cell carcinoma: Secondary | ICD-10-CM

## 2018-01-28 NOTE — Telephone Encounter (Signed)
Medication will be filled at [facility] and mailed on 11/6 for delivery 11/7.

## 2018-01-28 NOTE — Telephone Encounter (Signed)
Oral Oncology Patient Advocate Encounter  Prior Authorization for Nexavar has been approved.    PA# [id number] Effective dates: 03/24/17 through 03/26/19  Patients co-pay is $0.00  Oral Oncology Clinic will continue to follow.   [place] Patient [place] Phone [phone] Fax [phone] 01/28/2018 8:04 AM

## 2018-01-29 MED FILL — NexAVAR 200 MG TABS: 200 | 30 days supply | Qty: 120 | Fill #0

## 2018-01-30 ENCOUNTER — Other Ambulatory Visit: Payer: Medicare HMO

## 2018-01-30 NOTE — Progress Notes (Signed)
Tumor Board Documentation  [name] was presented by Dr Grayland Ormond at our Tumor Board on 01/30/2018, which included representatives from medical oncology, radiation oncology, surgical, radiology, pathology, navigation, internal medicine, pharmacy, research, pulmonology.  [name] currently presents as a new patient with history of the following treatments: surgical intervention(s).  Additionally, we reviewed previous medical and familial history, history of present illness, and recent lab results along with all available histopathologic and imaging studies. The tumor board considered available treatment options and made the following recommendations:   PET 01/31/18, recheck Bili if<2 can do Y 90  The following procedures/referrals were also placed: No orders of the defined types were placed in this encounter.   Clinical Trial Status: not discussed   Staging used: AJCC Stage Group  National site-specific guidelines NCCN were discussed with respect to the case.  Tumor board is a meeting of clinicians from various specialty areas who evaluate and discuss patients for whom a multidisciplinary approach is being considered. Final determinations in the plan of care are those of the provider(s). The responsibility for follow up of recommendations given during tumor board is that of the provider.   Today's extended care, comprehensive team conference, [name] was not present for the discussion and was not examined.   Multidisciplinary Tumor Board is a multidisciplinary case peer review process.  Decisions discussed in the Multidisciplinary Tumor Board reflect the opinions of the specialists present at the conference without having examined the patient.  Ultimately, treatment and diagnostic decisions rest with the primary provider(s) and the patient.

## 2018-01-31 ENCOUNTER — Ambulatory Visit
Admission: RE | Admit: 2018-01-31 | Discharge: 2018-01-31 | Disposition: A | Discharge status: Home / Self Care | Payer: Medicare HMO | Source: Ambulatory Visit | Attending: Oncology | Admitting: Oncology

## 2018-01-31 ENCOUNTER — Ambulatory Visit: Payer: Medicare HMO

## 2018-01-31 DIAGNOSIS — M5126 Other intervertebral disc displacement, lumbar region: Secondary | ICD-10-CM | POA: Diagnosis not present

## 2018-01-31 DIAGNOSIS — R911 Solitary pulmonary nodule: Secondary | ICD-10-CM | POA: Diagnosis not present

## 2018-01-31 DIAGNOSIS — K766 Portal hypertension: Secondary | ICD-10-CM | POA: Diagnosis not present

## 2018-01-31 DIAGNOSIS — C220 Liver cell carcinoma: Secondary | ICD-10-CM | POA: Insufficient documentation

## 2018-01-31 DIAGNOSIS — I2510 Atherosclerotic heart disease of native coronary artery without angina pectoris: Secondary | ICD-10-CM | POA: Insufficient documentation

## 2018-01-31 DIAGNOSIS — I864 Gastric varices: Secondary | ICD-10-CM | POA: Insufficient documentation

## 2018-01-31 DIAGNOSIS — I700 Atherosclerosis of aorta: Secondary | ICD-10-CM | POA: Diagnosis not present

## 2018-01-31 DIAGNOSIS — K7460 Unspecified cirrhosis of liver: Secondary | ICD-10-CM | POA: Insufficient documentation

## 2018-01-31 DIAGNOSIS — I8500 Esophageal varices without bleeding: Secondary | ICD-10-CM | POA: Insufficient documentation

## 2018-01-31 DIAGNOSIS — R161 Splenomegaly, not elsewhere classified: Secondary | ICD-10-CM | POA: Diagnosis not present

## 2018-01-31 LAB — GLUCOSE, CAPILLARY: Glucose-Capillary: 99 mg/dL (ref 70–99)

## 2018-01-31 MED ORDER — FLUDEOXYGLUCOSE F - 18 (FDG) INJECTION
8.7300 | Freq: Once | INTRAVENOUS | Status: AC | PRN
  Administered 2018-01-31: 8.73 via INTRAVENOUS

## 2018-01-31 NOTE — Telephone Encounter (Signed)
Called patient and she received medication 11/7.  She will start her first dose on 11/9 since she had a PET scan today.   [place] Patient [name] Phone [phone] Fax [phone] 01/31/2018 1:36 PM

## 2018-02-03 ENCOUNTER — Telehealth: Payer: Self-pay

## 2018-02-03 ENCOUNTER — Other Ambulatory Visit: Payer: Self-pay | Admitting: *Deleted

## 2018-02-03 DIAGNOSIS — C220 Liver cell carcinoma: Secondary | ICD-10-CM

## 2018-02-03 DIAGNOSIS — R918 Other nonspecific abnormal finding of lung field: Secondary | ICD-10-CM

## 2018-02-03 NOTE — Telephone Encounter (Signed)
Voicemail left with Ms. Hurd to return call regarding PET scan results. Dr. Grayland Ormond needs her lab work to be completed. PET positive lung lesion needs Ct guided biopsy (previously biopsied 08/15/17 by Dr. Mortimer Fries with [facility]. Negative pathology) . She has been placed back on MDT for 11/14. Dr. Grayland Ormond would also like for her to hold her Sorafenib pending lung work up. Oncology Nurse Navigator Documentation  Navigator Location: [facility]-Med Onc (02/03/18 1400)   )Navigator Encounter Type: Telephone;Diagnostic Results (02/03/18 1400) Telephone: Outgoing Call;Diagnostic Results (02/03/18 1400)                                                  Time Spent with Patient: 15 (02/03/18 1400)

## 2018-02-03 NOTE — Progress Notes (Signed)
cbc

## 2018-02-04 ENCOUNTER — Other Ambulatory Visit: Payer: Self-pay | Admitting: Family Medicine

## 2018-02-04 ENCOUNTER — Telehealth: Payer: Self-pay

## 2018-02-04 NOTE — Telephone Encounter (Signed)
Call returned to Ms. Graw. We discussed results of PET scan and I provided further education on the results. We discussed her previous lung biopsy that was performed in April 2019 by Dr. Mortimer Fries. Based on the SUV and appearance on the PET scan, Dr. Grayland Ormond would like to obtain a CT guided biopsy of this lung lesion. She may may a primary lung cancer and liver cancer. She is a little hesitant due to a previous CVA following ENB per her report. She will be discussed at MDT this Thursday, 11/14 , and we will obtain further direction regarding biopsy. She will come 11/13 to have her lab work completed. She was instructed to stop Sorafenib as directed by Dr. Grayland Ormond. Last dose taken was today, 02/04/18. She is reporting that her feet feel like she has "pins stuck in them" and she is asking what she can take for constipation. I will ask Dr. Grayland Ormond what he prefers she take for her chronic constipation. Oncology Nurse Navigator Documentation  Navigator Location: [facility] (02/04/18 1000)   )Navigator Encounter Type: Telephone;Diagnostic Results;Education (02/04/18 1000) Telephone: Outgoing Call;Diagnostic Results;Education (02/04/18 1000)                                                  Time Spent with Patient: 30 (02/04/18 1000)

## 2018-02-05 ENCOUNTER — Inpatient Hospital Stay: Payer: Medicare HMO

## 2018-02-05 ENCOUNTER — Telehealth: Payer: Self-pay | Admitting: Internal Medicine

## 2018-02-05 DIAGNOSIS — C220 Liver cell carcinoma: Secondary | ICD-10-CM

## 2018-02-05 LAB — COMPREHENSIVE METABOLIC PANEL
ALK PHOS: 153 U/L — AB (ref 38–126)
ALT: 27 U/L (ref 0–44)
ANION GAP: 8 (ref 5–15)
AST: 62 U/L — ABNORMAL HIGH (ref 15–41)
Albumin: 3.2 g/dL — ABNORMAL LOW (ref 3.5–5.0)
BUN: 10 mg/dL (ref 8–23)
CO2: 25 mmol/L (ref 22–32)
CREATININE: 0.84 mg/dL (ref 0.44–1.00)
Calcium: 8.2 mg/dL — ABNORMAL LOW (ref 8.9–10.3)
Chloride: 101 mmol/L (ref 98–111)
Glucose, Bld: 76 mg/dL (ref 70–99)
Potassium: 4.4 mmol/L (ref 3.5–5.1)
Sodium: 134 mmol/L — ABNORMAL LOW (ref 135–145)
TOTAL PROTEIN: 7.2 g/dL (ref 6.5–8.1)
Total Bilirubin: 1.2 mg/dL (ref 0.3–1.2)

## 2018-02-05 LAB — APTT: APTT: 32 s (ref 24–36)

## 2018-02-05 LAB — PROTIME-INR
INR: 1.08
Prothrombin Time: 13.9 seconds (ref 11.4–15.2)

## 2018-02-05 NOTE — Telephone Encounter (Signed)
Left message that patient needs to sign release of records.

## 2018-02-05 NOTE — Telephone Encounter (Signed)
Patient came by office asking if we can please mail of any medical records we have on her  She is asking for any and all scans and biopsy  She is asking this for herself   Please send to   [place]

## 2018-02-06 ENCOUNTER — Telehealth: Payer: Self-pay

## 2018-02-06 ENCOUNTER — Other Ambulatory Visit: Payer: Medicare HMO

## 2018-02-06 LAB — AFP TUMOR MARKER: AFP, SERUM, TUMOR MARKER: 149 ng/mL — AB (ref 0.0–8.3)

## 2018-02-06 NOTE — Telephone Encounter (Signed)
Received call from [name]. She is reporting on the left side of her body from shoulder blade down into fingers and down the entire left side "feels like I have been kicked by a horse" She is asking if this could be a side effect from stopping Sorafenib. She took her oral chemo for 3 days then stopped as requested by Dr. Grayland Ormond. The bottoms of her feer still feel like "pins are sticking in them when I walk". She denies any of these symptoms on the right side of her body. Denies any vision changes or changes in speech,. She does have a recent history of reported CVA. I told her that I can talk with Dr. Grayland Ormond and see if he would like for her to be seen in our [facility]. With these symptoms only being on one side of her body and history of CVA he will likely want her to go be evaluated in the ED. She stated she would go to the ED. I will update Dr. Grayland Ormond regarding this call.  Oncology Nurse Navigator Documentation  Navigator Location: [facility] (02/06/18 0800)   )Navigator Encounter Type: Telephone (02/06/18 0800) Telephone: Incoming Call;Symptom Mgt (02/06/18 0800)                                                  Time Spent with Patient: 15 (02/06/18 0800)

## 2018-02-06 NOTE — Progress Notes (Addendum)
Tumor Board Documentation  Verenise Moulin was presented by Dr Grayland Ormond at our Tumor Board on 02/06/2018, which included representatives from medical oncology, radiation oncology, surgical, radiology, research, pharmacy, internal medicine, navigation, pathology, palliative care, pulmonology.  Paizlee currently presents as a current patient with history of the following treatments: surgical intervention(s).  Additionally, we reviewed previous medical and familial history, history of present illness, and recent lab results along with all available histopathologic and imaging studies. The tumor board considered available treatment options and made the following recommendations:   Refer to IR for Y 90 and watch lung nodule  The following procedures/referrals were also placed: No orders of the defined types were placed in this encounter.   Clinical Trial Status: not discussed   Staging used:  AJCC  National site-specific guidelines   were discussed with respect to the case.  Tumor board is a meeting of clinicians from various specialty areas who evaluate and discuss patients for whom a multidisciplinary approach is being considered. Final determinations in the plan of care are those of the provider(s). The responsibility for follow up of recommendations given during tumor board is that of the provider.   Today's extended care, comprehensive team conference, Emmery was not present for the discussion and was not examined.   Multidisciplinary Tumor Board is a multidisciplinary case peer review process.  Decisions discussed in the Multidisciplinary Tumor Board reflect the opinions of the specialists present at the conference without having examined the patient.  Ultimately, treatment and diagnostic decisions rest with the primary provider(s) and the patient.

## 2018-02-07 ENCOUNTER — Encounter: Payer: Self-pay | Admitting: Family Medicine

## 2018-02-07 ENCOUNTER — Ambulatory Visit (INDEPENDENT_AMBULATORY_CARE_PROVIDER_SITE_OTHER): Payer: Medicare HMO | Admitting: Family Medicine

## 2018-02-07 ENCOUNTER — Other Ambulatory Visit: Payer: Self-pay | Admitting: *Deleted

## 2018-02-07 ENCOUNTER — Telehealth: Payer: Self-pay

## 2018-02-07 DIAGNOSIS — M791 Myalgia, unspecified site: Secondary | ICD-10-CM | POA: Diagnosis not present

## 2018-02-07 DIAGNOSIS — R911 Solitary pulmonary nodule: Secondary | ICD-10-CM | POA: Diagnosis not present

## 2018-02-07 DIAGNOSIS — C220 Liver cell carcinoma: Secondary | ICD-10-CM

## 2018-02-07 MED ORDER — TRAMADOL HCL 50 MG PO TABS: 50.0000 mg | ORAL_TABLET | Freq: Three times a day (TID) | ORAL | 0 refills | Status: DC | PRN

## 2018-02-07 NOTE — Assessment & Plan Note (Signed)
Possible primary  Lung CA... Had CVA after past attempt at biopsy.  ONC reports per tumor board given minimal change, plan is to follow  Over time.

## 2018-02-07 NOTE — Assessment & Plan Note (Signed)
Discussed with D,r Grayland Ormond.. Plan from tumor board is ablation to liver lesions. NO oral chemo at this time.

## 2018-02-07 NOTE — Telephone Encounter (Signed)
Voicemail left with Ms. Bias to return call. I would like to discuss recommendations from tumor board. She saw her PCP today and she spoke with Dr. Grayland Ormond regarding plan of care. Oncology Nurse Navigator Documentation  Navigator Location: [facility] (02/07/18 1500)   )Navigator Encounter Type: Telephone (02/07/18 1500) Telephone: Outgoing Call;Patient Update (02/07/18 1500)                                                  Time Spent with Patient: 15 (02/07/18 1500)

## 2018-02-07 NOTE — Assessment & Plan Note (Signed)
Sudden onset... Since stopping chemo, but only took for 3 days. Limb pain appear in 15% of users of sorafenib. Discussed with Dr. Wayne Sever.Marland Kitchen Not common SE on stopping.  No clear evidence of cervical radiculopathy, shoulder pathology.  Pt very uncomfortable.. Will use pain med to treat for limited time.  If due to chemo.. Will improve over time as off the med.

## 2018-02-07 NOTE — Patient Instructions (Addendum)
Likely related to recent chemotherapy medication.  We will contact you after discussing with oncology.  Can start tramadol for pain.

## 2018-02-07 NOTE — Progress Notes (Signed)
Acute Office Visit  Subjective:    Patient ID: [name], female    DOB: 1955/08/20, 62 y.o.   MRN: [id number]  Chief Complaint  Patient presents with  . Shoulder Pain    complains of left shoulder pain since wednesday.     HPI Patient is in today for  Left shoulder pain x 2 days.   New onset pain in left shoulder blade, and pain radiates to left arm, throbbing ache, constant.  She also notes pain in bilateral legs... Cramping pain after standing a while... Resolved when she sits down.  Pain started all of a sudden after blood draw on Wednesday. Pain 10/10 on pain scale for left shoulder pain  She has tried Biofreeze, tylenol, ice, heat No change in pain with neck movement. No zinging electric pain down arm. No new numbness, no weakness.    No fall, no change in activity.  No past history of shoulder issues   She has had recent new dx of  Liver cancer. Clinical stage from 01/28/2018: Stage IIIA (cT3, cN0, cM0 ONC Dr. Grayland Ormond.  Reviewed initial visit from 01/27/2018.  Started on sorafenib.. But was told to stop this given pet scan showed possible lesion in lung. Plan CT guided biospy.  Past Medical History:  Diagnosis Date  . Acquired hypothyroidism    thyroid goiter s/p thyroidectomy  . Allergy   . Bipolar 1 disorder ([place]) 1990s  . Chronic hepatitis C (HCC)    genotype 1a  . Compensated HCV cirrhosis ([place])   . Complication of anesthesia    states 15 yrs ago, hard time waking up, states cant breathe when coming out of anesthesia  . Complication of anesthesia    per patient heart stopped  . COPD (chronic obstructive pulmonary disease) ([place])   . Depression    states seen weekly at [facility]  . GERD (gastroesophageal reflux disease)    Hpylori + and treated (2012)  . History of alcohol abuse   . Insomnia   . Migraines   . Pneumonia   . PONV (postoperative nausea and vomiting)    "it all depends on what kind they give me"  . Portal hypertension ([place])    . Portal hypertensive gastropathy ([place]) 2013  . Smoker   . Stroke ([place])   . TIA (transient ischemic attack)   . Varicose veins     Past Surgical History:  Procedure Laterality Date  . APPENDECTOMY    . DIAGNOSTIC LAPAROSCOPY     15 yrs ago  . ELECTROMAGNETIC NAVIGATION BROCHOSCOPY N/A 08/15/2017   Procedure: ELECTROMAGNETIC NAVIGATION BRONCHOSCOPY;  Surgeon: Flora Lipps, MD;  Location: [facility] ORS;  Service: Cardiopulmonary;  Laterality: N/A;  . ESOPHAGOGASTRODUODENOSCOPY  10/2010   nl esophagus, HH, portal hypertensive gastropathy, erosive gastropathy, Hpylori + (Dr. Sydnee Levans)  . foot surgery Bilateral 2017   Callus removal  . left foot surgery  1990s  . LIVER BIOPSY    . THYROIDECTOMY  05/17/2011   Procedure: TOTAL THYROIDECTOMY;  Surgeon: Earnstine Regal, MD, benign goiter per path report    Family History  Problem Relation Age of Onset  . Heart disease Mother        pacemaker  . Cancer Father        bladder/melanoma  . Diabetes Father   . Hypertension Father   . Melanoma Sister   . Breast cancer Neg Hx     Social History   Socioeconomic History  . Marital status: Single  Spouse name: Not on file  . Number of children: 0  . Years of education: 35  . Highest education level: High school graduate  Occupational History  . Occupation: unemployed  Social Needs  . Financial resource strain: Not on file  . Food insecurity:    Worry: Not on file    Inability: Not on file  . Transportation needs:    Medical: Not on file    Non-medical: Not on file  Tobacco Use  . Smoking status: Current Some Day Smoker    Packs/day: 0.50    Years: 40.00    Pack years: 20.00    Types: Cigarettes  . Smokeless tobacco: Former Systems developer    Types: Chew  . Tobacco comment: decreasing use 05/30/2011, taking chantix   Substance and Sexual Activity  . Alcohol use: No  . Drug use: No  . Sexual activity: Yes  Lifestyle  . Physical activity:    Days per week: Not on file    Minutes per  session: Not on file  . Stress: Not on file  Relationships  . Social connections:    Talks on phone: Not on file    Gets together: Not on file    Attends religious service: Not on file    Active member of club or organization: Not on file    Attends meetings of clubs or organizations: Not on file    Relationship status: Not on file  . Intimate partner violence:    Fear of current or ex partner: Not on file    Emotionally abused: Not on file    Physically abused: Not on file    Forced sexual activity: Not on file  Other Topics Concern  . Not on file  Social History Narrative   Lives alone.  Divorced.  No children   Occupation: disability for bipolar   Activity: no regular exercise   Diet: some water, fruits/vegetables occasional   Right-handed.   3 cups caffeine per day.    Outpatient Medications Prior to Visit  Medication Sig Dispense Refill  . aspirin EC 81 MG tablet Take 1 tablet by mouth daily.    Marland Kitchen atorvastatin (LIPITOR) 10 MG tablet TAKE 1 TABLET BY MOUTH EVERY NIGHT AT BEDTIME 90 tablet 1  . benzonatate (TESSALON) 200 MG capsule Take 1 capsule (200 mg total) by mouth 2 (two) times daily as needed for cough. 20 capsule 0  . CHANTIX 0.5 MG tablet TAKE 1 TABLET BY MOUTH 2 TIMES DAILY 60 tablet 2  . furosemide (LASIX) 20 MG tablet TAKE 2 TABLETS BY MOUTH DAILY 60 tablet 5  . ibuprofen (ADVIL,MOTRIN) 200 MG tablet Take 200-400 mg by mouth daily as needed for headache or moderate pain.    Marland Kitchen lamoTRIgine (LAMICTAL) 25 MG tablet Take 25 mg by mouth daily.     Marland Kitchen lidocaine-prilocaine (EMLA) cream Apply 1 application topically as needed. 30 g 2  . QUEtiapine (SEROQUEL) 300 MG tablet Take 300 mg by mouth at bedtime as needed (sleep).     Marland Kitchen spironolactone (ALDACTONE) 25 MG tablet TAKE 4 TABLETS BY MOUTH DAILY 120 tablet 5  . SYNTHROID 88 MCG tablet TAKE 1 TABLET BY MOUTH ONCE DAILY 90 tablet 3  . traZODone (DESYREL) 100 MG tablet Take 100 mg by mouth at bedtime as needed for sleep.       Marland Kitchen umeclidinium bromide (INCRUSE ELLIPTA) 62.5 MCG/INH AEPB Inhale 1 puff into the lungs daily. 30 each 5  . SORAfenib (NEXAVAR) 200 MG tablet Take  2 tablets (400 mg total) by mouth 2 (two) times daily. Give on an empty stomach 1 hour before or 2 hours after meals. 120 tablet 2   No facility-administered medications prior to visit.     Allergies  Allergen Reactions  . Demerol Anaphylaxis  . Penicillins Anaphylaxis, Hives and Itching    Has patient had a PCN reaction causing immediate rash, facial/tongue/throat swelling, SOB or lightheadedness with hypotension: Yes Has patient had a PCN reaction causing severe rash involving mucus membranes or skin necrosis: Yes Has patient had a PCN reaction that required hospitalization: Yes Has patient had a PCN reaction occurring within the last 10 years: No If all of the above answers are "NO", then may proceed with Cephalosporin use.   . Other Other (See Comments)    general anesthesia - heart stopped   . Propofol Other (See Comments)    Allergic, caused her to code   . Latex Rash  . Oxycodone Hcl Rash    Review of Systems  Constitutional: Positive for malaise/fatigue.  Respiratory: Negative for cough.   Cardiovascular: Negative for chest pain and palpitations.  Gastrointestinal: Negative for abdominal pain.  Genitourinary: Negative for dysuria.  Musculoskeletal: Positive for back pain and myalgias.  Neurological: Negative for dizziness, tingling and sensory change.  Psychiatric/Behavioral: Negative for depression.       Objective:    Physical Exam  Constitutional: Vital signs are normal. She appears well-developed and well-nourished. She is cooperative.  Non-toxic appearance. She has a sickly appearance. She does not appear ill. No distress.   Walking hunched over and slowed due to pain in legs and muscles  HENT:  Head: Normocephalic.  Right Ear: Hearing, tympanic membrane, external ear and ear canal normal. Tympanic membrane is not  erythematous, not retracted and not bulging.  Left Ear: Hearing, tympanic membrane, external ear and ear canal normal. Tympanic membrane is not erythematous, not retracted and not bulging.  Nose: No mucosal edema or rhinorrhea. Right sinus exhibits no maxillary sinus tenderness and no frontal sinus tenderness. Left sinus exhibits no maxillary sinus tenderness and no frontal sinus tenderness.  Mouth/Throat: Uvula is midline, oropharynx is clear and moist and mucous membranes are normal.  Eyes: Pupils are equal, round, and reactive to light. Conjunctivae, EOM and lids are normal. Lids are everted and swept, no foreign bodies found.  Neck: Trachea normal and normal range of motion. Neck supple. Carotid bruit is not present. No thyroid mass and no thyromegaly present.  Cardiovascular: Normal rate, regular rhythm, S1 normal, S2 normal, normal heart sounds, intact distal pulses and normal pulses. Exam reveals no gallop and no friction rub.  No murmur heard. Pulmonary/Chest: Effort normal and breath sounds normal. No tachypnea. No respiratory distress. She has no decreased breath sounds. She has no wheezes. She has no rhonchi. She has no rales.  Abdominal: Soft. Normal appearance and bowel sounds are normal. There is no tenderness.  Musculoskeletal:       Left shoulder: She exhibits tenderness. She exhibits normal range of motion and no bony tenderness.       Cervical back: She exhibits tenderness. She exhibits normal range of motion and no bony tenderness.  ttp in trapezius on left, left upper and lower arm, bilateral lower legs tender to light palpation.    no joint swelling or redness   neg spurling on left  Neg Neer's , neg drop arm  Neurological: She is alert. She is not disoriented. No cranial nerve deficit or sensory deficit. She exhibits  normal muscle tone. Gait abnormal.   Decreased left grip.. Due to limited by pain  Skin: Skin is warm, dry and intact. No rash noted.  Psychiatric: Her speech  is normal and behavior is normal. Judgment and thought content normal. Her mood appears not anxious. Cognition and memory are normal. She does not exhibit a depressed mood.    BP 122/64 (BP Location: Right Arm, Patient Position: Sitting, Cuff Size: Small)   Pulse 80   Temp 97.6 F (36.4 C) (Oral)   Resp 18   Ht 5\' 3"  (1.6 m)   Wt 149 lb (67.6 kg)   SpO2 96%   BMI 26.39 kg/m  Wt Readings from Last 3 Encounters:  02/07/18 149 lb (67.6 kg)  01/27/18 158 lb 6 oz (71.8 kg)  01/10/18 160 lb (72.6 kg)    There are no preventive care reminders to display for this patient.  There are no preventive care reminders to display for this patient.   Lab Results  Component Value Date   TSH 0.21 (L) 11/29/2017   Lab Results  Component Value Date   WBC 7.6 08/15/2017   HGB 13.7 08/15/2017   HCT 40.0 08/15/2017   MCV 92.0 08/15/2017   PLT 86 (L) 08/15/2017   Lab Results  Component Value Date   NA 134 (L) 02/05/2018   K 4.4 02/05/2018   CO2 25 02/05/2018   GLUCOSE 76 02/05/2018   BUN 10 02/05/2018   CREATININE 0.84 02/05/2018   BILITOT 1.2 02/05/2018   ALKPHOS 153 (H) 02/05/2018   AST 62 (H) 02/05/2018   ALT 27 02/05/2018   PROT 7.2 02/05/2018   ALBUMIN 3.2 (L) 02/05/2018   CALCIUM 8.2 (L) 02/05/2018   ANIONGAP 8 02/05/2018   GFR 62.52 06/14/2017   Lab Results  Component Value Date   CHOL 156 06/14/2017   Lab Results  Component Value Date   HDL 50.20 06/14/2017   Lab Results  Component Value Date   LDLCALC 86 06/14/2017   Lab Results  Component Value Date   TRIG 98.0 06/14/2017   Lab Results  Component Value Date   CHOLHDL 3 06/14/2017   Lab Results  Component Value Date   HGBA1C 5.1 12/12/2016       Assessment & Plan:    Eliezer Lofts, MD

## 2018-02-10 ENCOUNTER — Telehealth: Payer: Self-pay | Admitting: Family Medicine

## 2018-02-10 ENCOUNTER — Telehealth: Payer: Self-pay

## 2018-02-10 ENCOUNTER — Other Ambulatory Visit: Payer: Self-pay | Admitting: *Deleted

## 2018-02-10 ENCOUNTER — Other Ambulatory Visit: Payer: Self-pay | Admitting: Oncology

## 2018-02-10 DIAGNOSIS — C220 Liver cell carcinoma: Secondary | ICD-10-CM

## 2018-02-10 MED ORDER — METHOCARBAMOL 500 MG PO TABS: 500.0000 mg | ORAL_TABLET | Freq: Three times a day (TID) | ORAL | 0 refills | Status: DC | PRN

## 2018-02-10 NOTE — Telephone Encounter (Signed)
Spoke to patient by telephone and was advised that her symptoms are about the same and not any better. Patient advised as instructed and verbalized understanding. Patient stated that she will try the muscle relaxant for a few days and will let Dr. Diona Browner know how she is doing.

## 2018-02-10 NOTE — Telephone Encounter (Signed)
Please get update on symptoms. Is she feeling any better?   Let try a muscle relaxant  For myalgia. Keep up with water intake.  Stop tramadol given not helping.  If not getting better.. In next 2-3 days.. Have her make lab appt to check muscle enzymes, etc.

## 2018-02-10 NOTE — Telephone Encounter (Signed)
At the request of Ms. Yazdi I have spoken with her sister Jocelyn Lamer and updated on plan of care. She does report that Ms. Cupples suffers from anxiety and bipolar and does get really worked up over things. I discussed at length her PET scan results, previous lung biopsy performed in April. We went over the tumor board recommendations and her upcoming appointment with IR. I highly encouraged a family member to attend this consult as they will go over a lot of information that will likely overwhelm Ms. Burdett. They are also radiology experts and can explain her liver/lung findings and possibly even show her images of these. We have only been able to discuss these over the phone at this point.  Oncology Nurse Navigator Documentation  Navigator Location: [facility] (02/10/18 1600)   )Navigator Encounter Type: Telephone (02/10/18 1600) Telephone: Incoming Call;Outgoing Call;Education;Patient Update (02/10/18 1600)                                                  Time Spent with Patient: 30 (02/10/18 1600)

## 2018-02-10 NOTE — Telephone Encounter (Signed)
Pt came in office Friday and Dr Diona Browner prescribe her tramadol for her side and shoulder pain. Pt stated the medication is only making her sleepy and when she wake up she is still in pain. Pt want to know if there is something else she can take or do. Please advise.

## 2018-02-13 ENCOUNTER — Encounter: Payer: Self-pay | Admitting: Internal Medicine

## 2018-02-13 ENCOUNTER — Encounter: Admission: RE | Payer: Self-pay | Source: Ambulatory Visit

## 2018-02-13 ENCOUNTER — Ambulatory Visit (INDEPENDENT_AMBULATORY_CARE_PROVIDER_SITE_OTHER)
Admission: RE | Admit: 2018-02-13 | Discharge: 2018-02-13 | Disposition: A | Discharge status: Home / Self Care | Payer: Medicare HMO | Source: Ambulatory Visit | Attending: Internal Medicine | Admitting: Internal Medicine

## 2018-02-13 ENCOUNTER — Ambulatory Visit: Admission: RE | Admit: 2018-02-13 | Payer: Medicare HMO | Source: Ambulatory Visit | Admitting: Gastroenterology

## 2018-02-13 ENCOUNTER — Ambulatory Visit (INDEPENDENT_AMBULATORY_CARE_PROVIDER_SITE_OTHER): Payer: Medicare HMO | Admitting: Internal Medicine

## 2018-02-13 VITALS — BP 120/76 | HR 78 | Temp 97.9°F | Wt 161.0 lb

## 2018-02-13 DIAGNOSIS — M19012 Primary osteoarthritis, left shoulder: Secondary | ICD-10-CM | POA: Diagnosis not present

## 2018-02-13 DIAGNOSIS — M47812 Spondylosis without myelopathy or radiculopathy, cervical region: Secondary | ICD-10-CM | POA: Diagnosis not present

## 2018-02-13 DIAGNOSIS — M79602 Pain in left arm: Secondary | ICD-10-CM

## 2018-02-13 SURGERY — EGD (ESOPHAGOGASTRODUODENOSCOPY)
Anesthesia: General

## 2018-02-13 NOTE — Progress Notes (Signed)
Subjective:    Patient ID: [name], female    DOB: 15-Jan-1956, 62 y.o.   MRN: [id number]  HPI  Patient presents to the clinic today with complaint of left arm pain. She reports this started 10 days ago. The pain starts in her left shoulder and radiates into her hand. She describes the pain as achy, sharp, stabbing and throbbing. She does have some weakness but denies numbness or tingling. She denies any injury to the area. She denies neck pain. She was seen for the same 11/15 by her PCP.  She has tried Biofreeze, Tylenol, Tramadol, Robaxin, ice and heat with out any relief.  She recently stopped chemo and felt like this might be related.  Dr. Grayland Ormond was consulted and advised if it was chemo related, pain would improved after stopping the chemo. She reports pain is getting worse instead of better.  Review of Systems      Past Medical History:  Diagnosis Date  . Acquired hypothyroidism    thyroid goiter s/p thyroidectomy  . Allergy   . Bipolar 1 disorder ([place]) 1990s  . Chronic hepatitis C (HCC)    genotype 1a  . Compensated HCV cirrhosis ([place])   . Complication of anesthesia    states 15 yrs ago, hard time waking up, states cant breathe when coming out of anesthesia  . Complication of anesthesia    per patient heart stopped  . COPD (chronic obstructive pulmonary disease) ([place])   . Depression    states seen weekly at [facility]  . GERD (gastroesophageal reflux disease)    Hpylori + and treated (2012)  . History of alcohol abuse   . Insomnia   . Migraines   . Pneumonia   . PONV (postoperative nausea and vomiting)    "it all depends on what kind they give me"  . Portal hypertension ([place])   . Portal hypertensive gastropathy ([place]) 2013  . Smoker   . Stroke ([place])   . TIA (transient ischemic attack)   . Varicose veins     Current Outpatient Medications  Medication Sig Dispense Refill  . aspirin EC 81 MG tablet Take 1 tablet by mouth daily.    Marland Kitchen atorvastatin  (LIPITOR) 10 MG tablet TAKE 1 TABLET BY MOUTH EVERY NIGHT AT BEDTIME 90 tablet 1  . benzonatate (TESSALON) 200 MG capsule Take 1 capsule (200 mg total) by mouth 2 (two) times daily as needed for cough. 20 capsule 0  . CHANTIX 0.5 MG tablet TAKE 1 TABLET BY MOUTH 2 TIMES DAILY 60 tablet 2  . furosemide (LASIX) 20 MG tablet TAKE 2 TABLETS BY MOUTH DAILY 60 tablet 5  . ibuprofen (ADVIL,MOTRIN) 200 MG tablet Take 200-400 mg by mouth daily as needed for headache or moderate pain.    Marland Kitchen lamoTRIgine (LAMICTAL) 25 MG tablet Take 25 mg by mouth daily.     Marland Kitchen lidocaine-prilocaine (EMLA) cream Apply 1 application topically as needed. 30 g 2  . methocarbamol (ROBAXIN) 500 MG tablet Take 1 tablet (500 mg total) by mouth every 8 (eight) hours as needed for muscle spasms. 30 tablet 0  . QUEtiapine (SEROQUEL) 300 MG tablet Take 300 mg by mouth at bedtime as needed (sleep).     Marland Kitchen spironolactone (ALDACTONE) 25 MG tablet TAKE 4 TABLETS BY MOUTH DAILY 120 tablet 5  . SYNTHROID 88 MCG tablet TAKE 1 TABLET BY MOUTH ONCE DAILY 90 tablet 3  . traMADol (ULTRAM) 50 MG tablet Take 1 tablet (50 mg total) by  mouth every 8 (eight) hours as needed. 30 tablet 0  . traZODone (DESYREL) 100 MG tablet Take 100 mg by mouth at bedtime as needed for sleep.     Marland Kitchen umeclidinium bromide (INCRUSE ELLIPTA) 62.5 MCG/INH AEPB Inhale 1 puff into the lungs daily. 30 each 5   No current facility-administered medications for this visit.     Allergies  Allergen Reactions  . Demerol Anaphylaxis  . Penicillins Anaphylaxis, Hives and Itching    Has patient had a PCN reaction causing immediate rash, facial/tongue/throat swelling, SOB or lightheadedness with hypotension: Yes Has patient had a PCN reaction causing severe rash involving mucus membranes or skin necrosis: Yes Has patient had a PCN reaction that required hospitalization: Yes Has patient had a PCN reaction occurring within the last 10 years: No If all of the above answers are "NO", then  may proceed with Cephalosporin use.   . Other Other (See Comments)    general anesthesia - heart stopped   . Propofol Other (See Comments)    Allergic, caused her to code   . Latex Rash  . Oxycodone Hcl Rash    Family History  Problem Relation Age of Onset  . Heart disease Mother        pacemaker  . Cancer Father        bladder/melanoma  . Diabetes Father   . Hypertension Father   . Melanoma Sister   . Breast cancer Neg Hx     Social History   Socioeconomic History  . Marital status: Single    Spouse name: Not on file  . Number of children: 0  . Years of education: 30  . Highest education level: High school graduate  Occupational History  . Occupation: unemployed  Social Needs  . Financial resource strain: Not on file  . Food insecurity:    Worry: Not on file    Inability: Not on file  . Transportation needs:    Medical: Not on file    Non-medical: Not on file  Tobacco Use  . Smoking status: Current Some Day Smoker    Packs/day: 0.50    Years: 40.00    Pack years: 20.00    Types: Cigarettes  . Smokeless tobacco: Former Systems developer    Types: Chew  . Tobacco comment: decreasing use 05/30/2011, taking chantix   Substance and Sexual Activity  . Alcohol use: No  . Drug use: No  . Sexual activity: Yes  Lifestyle  . Physical activity:    Days per week: Not on file    Minutes per session: Not on file  . Stress: Not on file  Relationships  . Social connections:    Talks on phone: Not on file    Gets together: Not on file    Attends religious service: Not on file    Active member of club or organization: Not on file    Attends meetings of clubs or organizations: Not on file    Relationship status: Not on file  . Intimate partner violence:    Fear of current or ex partner: Not on file    Emotionally abused: Not on file    Physically abused: Not on file    Forced sexual activity: Not on file  Other Topics Concern  . Not on file  Social History Narrative   Lives  alone.  Divorced.  No children   Occupation: disability for bipolar   Activity: no regular exercise   Diet: some water, fruits/vegetables occasional   Right-handed.  3 cups caffeine per day.     Constitutional: Denies fever, malaise, fatigue, headache or abrupt weight changes.  Musculoskeletal: Pt reports left arm pain, decreased range of motion, weakness. Denies difficulty with gait,  or joint  swelling.  Skin: Denies redness, rashes, lesions or ulcercations.  Neurological: Denies numbness, tingling or problems with coordination.    No other specific complaints in a complete review of systems (except as listed in HPI above).  Objective:   Physical Exam   BP 120/76   Pulse 78   Temp 97.9 F (36.6 C) (Oral)   Wt 161 lb (73 kg)   SpO2 97%   BMI 28.52 kg/m  Wt Readings from Last 3 Encounters:  02/13/18 161 lb (73 kg)  02/07/18 149 lb (67.6 kg)  01/27/18 158 lb 6 oz (71.8 kg)    General: Appears her stated age, well developed, well nourished in NAD. Cardiovascular: Radial pulse 2+ bilaterally. Musculoskeletal: Decreased flexion and extension of the cervical spine due to pain. Normal rotation. No bony tenderness noted over the cervical spine. Decreased abduction, adduction, internal and external rotation of the left arm. Pain with palpation over the left AC joint, anterior proximal biceps tendon. Positive drop can on the left. Strength 4/5 LUE, 5/5 RUE. Hand grips unequal R>L. Neurological: Alert and oriented. Sensation intact to BUE.  BMET    Component Value Date/Time   NA 134 (L) 02/05/2018 1309   NA 142 09/26/2011   K 4.4 02/05/2018 1309   CL 101 02/05/2018 1309   CO2 25 02/05/2018 1309   GLUCOSE 76 02/05/2018 1309   BUN 10 02/05/2018 1309   BUN 11 09/26/2011   CREATININE 0.84 02/05/2018 1309   CREATININE 0.65 01/27/2015 1719   CALCIUM 8.2 (L) 02/05/2018 1309   GFRNONAA >60 02/05/2018 1309   GFRAA >60 02/05/2018 1309    Lipid Panel     Component Value  Date/Time   CHOL 156 06/14/2017 1204   TRIG 98.0 06/14/2017 1204   HDL 50.20 06/14/2017 1204   CHOLHDL 3 06/14/2017 1204   VLDL 19.6 06/14/2017 1204   LDLCALC 86 06/14/2017 1204    CBC    Component Value Date/Time   WBC 7.6 08/15/2017 1818   RBC 4.35 08/15/2017 1818   HGB 13.7 08/15/2017 1818   HCT 40.0 08/15/2017 1818   PLT 86 (L) 08/15/2017 1818   MCV 92.0 08/15/2017 1818   MCH 31.5 08/15/2017 1818   MCHC 34.3 08/15/2017 1818   RDW 12.7 08/15/2017 1818   LYMPHSABS 0.5 (L) 08/15/2017 1818   MONOABS 0.2 08/15/2017 1818   EOSABS 0.0 08/15/2017 1818   BASOSABS 0.0 08/15/2017 1818    Hgb A1C Lab Results  Component Value Date   HGBA1C 5.1 12/12/2016           Assessment & Plan:   Left Arm Pain:  Will obtain xray cervical spine, left shoulder Offered Toradol 30 mg IM today, she declines. Continue Tramadol and Methocarbamol as prescribed by previous provider She is interested in referral to PT, will discuss this pending xrays  Will follow up after xrays, return precautions discussed Webb Silversmith, NP

## 2018-02-13 NOTE — Patient Instructions (Signed)
Shoulder Exercises Ask your health care provider which exercises are safe for you. Do exercises exactly as told by your health care provider and adjust them as directed. It is normal to feel mild stretching, pulling, tightness, or discomfort as you do these exercises, but you should stop right away if you feel sudden pain or your pain gets worse.Do not begin these exercises until told by your health care provider. RANGE OF MOTION EXERCISES These exercises warm up your muscles and joints and improve the movement and flexibility of your shoulder. These exercises also help to relieve pain, numbness, and tingling. These exercises involve stretching your injured shoulder directly. Exercise A: Pendulum  1. Stand near a wall or a surface that you can hold onto for balance. 2. Bend at the waist and let your left / right arm hang straight down. Use your other arm to support you. Keep your back straight and do not lock your knees. 3. Relax your left / right arm and shoulder muscles, and move your hips and your trunk so your left / right arm swings freely. Your arm should swing because of the motion of your body, not because you are using your arm or shoulder muscles. 4. Keep moving your body so your arm swings in the following directions, as told by your health care provider: ? Side to side. ? Forward and backward. ? In clockwise and counterclockwise circles. 5. Continue each motion for __________ seconds, or for as long as told by your health care provider. 6. Slowly return to the starting position. Repeat __________ times. Complete this exercise __________ times a day. Exercise B:Flexion, Standing  1. Stand and hold a broomstick, a cane, or a similar object. Place your hands a little more than shoulder-width apart on the object. Your left / right hand should be palm-up, and your other hand should be palm-down. 2. Keep your elbow straight and keep your shoulder muscles relaxed. Push the stick down with  your healthy arm to raise your left / right arm in front of your body, and then over your head until you feel a stretch in your shoulder. ? Avoid shrugging your shoulder while you raise your arm. Keep your shoulder blade tucked down toward the middle of your back. 3. Hold for __________ seconds. 4. Slowly return to the starting position. Repeat __________ times. Complete this exercise __________ times a day. Exercise C: Abduction, Standing 1. Stand and hold a broomstick, a cane, or a similar object. Place your hands a little more than shoulder-width apart on the object. Your left / right hand should be palm-up, and your other hand should be palm-down. 2. While keeping your elbow straight and your shoulder muscles relaxed, push the stick across your body toward your left / right side. Raise your left / right arm to the side of your body and then over your head until you feel a stretch in your shoulder. ? Do not raise your arm above shoulder height, unless your health care provider tells you to do that. ? Avoid shrugging your shoulder while you raise your arm. Keep your shoulder blade tucked down toward the middle of your back. 3. Hold for __________ seconds. 4. Slowly return to the starting position. Repeat __________ times. Complete this exercise __________ times a day. Exercise D:Internal Rotation  1. Place your left / right hand behind your back, palm-up. 2. Use your other hand to dangle an exercise band, a towel, or a similar object over your shoulder. Grasp the band with   your left / right hand so you are holding onto both ends. 3. Gently pull up on the band until you feel a stretch in the front of your left / right shoulder. ? Avoid shrugging your shoulder while you raise your arm. Keep your shoulder blade tucked down toward the middle of your back. 4. Hold for __________ seconds. 5. Release the stretch by letting go of the band and lowering your hands. Repeat __________ times. Complete  this exercise __________ times a day. STRETCHING EXERCISES These exercises warm up your muscles and joints and improve the movement and flexibility of your shoulder. These exercises also help to relieve pain, numbness, and tingling. These exercises are done using your healthy shoulder to help stretch the muscles of your injured shoulder. Exercise E: Corner Stretch (External Rotation and Abduction)  1. Stand in a doorway with one of your feet slightly in front of the other. This is called a staggered stance. If you cannot reach your forearms to the door frame, stand facing a corner of a room. 2. Choose one of the following positions as told by your health care provider: ? Place your hands and forearms on the door frame above your head. ? Place your hands and forearms on the door frame at the height of your head. ? Place your hands on the door frame at the height of your elbows. 3. Slowly move your weight onto your front foot until you feel a stretch across your chest and in the front of your shoulders. Keep your head and chest upright and keep your abdominal muscles tight. 4. Hold for __________ seconds. 5. To release the stretch, shift your weight to your back foot. Repeat __________ times. Complete this stretch __________ times a day. Exercise F:Extension, Standing 1. Stand and hold a broomstick, a cane, or a similar object behind your back. ? Your hands should be a little wider than shoulder-width apart. ? Your palms should face away from your back. 2. Keeping your elbows straight and keeping your shoulder muscles relaxed, move the stick away from your body until you feel a stretch in your shoulder. ? Avoid shrugging your shoulders while you move the stick. Keep your shoulder blade tucked down toward the middle of your back. 3. Hold for __________ seconds. 4. Slowly return to the starting position. Repeat __________ times. Complete this exercise __________ times a day. STRENGTHENING  EXERCISES These exercises build strength and endurance in your shoulder. Endurance is the ability to use your muscles for a long time, even after they get tired. Exercise G:External Rotation  1. Sit in a stable chair without armrests. 2. Secure an exercise band at elbow height on your left / right side. 3. Place a soft object, such as a folded towel or a small pillow, between your left / right upper arm and your body to move your elbow a few inches away (about 10 cm) from your side. 4. Hold the end of the band so it is tight and there is no slack. 5. Keeping your elbow pressed against the soft object, move your left / right forearm out, away from your abdomen. Keep your body steady so only your forearm moves. 6. Hold for __________ seconds. 7. Slowly return to the starting position. Repeat __________ times. Complete this exercise __________ times a day. Exercise H:Shoulder Abduction  1. Sit in a stable chair without armrests, or stand. 2. Hold a __________ weight in your left / right hand, or hold an exercise band with both hands.   3. Start with your arms straight down and your left / right palm facing in, toward your body. 4. Slowly lift your left / right hand out to your side. Do not lift your hand above shoulder height unless your health care provider tells you that this is safe. ? Keep your arms straight. ? Avoid shrugging your shoulder while you do this movement. Keep your shoulder blade tucked down toward the middle of your back. 5. Hold for __________ seconds. 6. Slowly lower your arm, and return to the starting position. Repeat __________ times. Complete this exercise __________ times a day. Exercise I:Shoulder Extension 1. Sit in a stable chair without armrests, or stand. 2. Secure an exercise band to a stable object in front of you where it is at shoulder height. 3. Hold one end of the exercise band in each hand. Your palms should face each other. 4. Straighten your elbows and  lift your hands up to shoulder height. 5. Step back, away from the secured end of the exercise band, until the band is tight and there is no slack. 6. Squeeze your shoulder blades together as you pull your hands down to the sides of your thighs. Stop when your hands are straight down by your sides. Do not let your hands go behind your body. 7. Hold for __________ seconds. 8. Slowly return to the starting position. Repeat __________ times. Complete this exercise __________ times a day. Exercise J:Standing Shoulder Row 1. Sit in a stable chair without armrests, or stand. 2. Secure an exercise band to a stable object in front of you so it is at waist height. 3. Hold one end of the exercise band in each hand. Your palms should be in a thumbs-up position. 4. Bend each of your elbows to an "L" shape (about 90 degrees) and keep your upper arms at your sides. 5. Step back until the band is tight and there is no slack. 6. Slowly pull your elbows back behind you. 7. Hold for __________ seconds. 8. Slowly return to the starting position. Repeat __________ times. Complete this exercise __________ times a day. Exercise K:Shoulder Press-Ups  1. Sit in a stable chair that has armrests. Sit upright, with your feet flat on the floor. 2. Put your hands on the armrests so your elbows are bent and your fingers are pointing forward. Your hands should be about even with the sides of your body. 3. Push down on the armrests and use your arms to lift yourself off of the chair. Straighten your elbows and lift yourself up as much as you comfortably can. ? Move your shoulder blades down, and avoid letting your shoulders move up toward your ears. ? Keep your feet on the ground. As you get stronger, your feet should support less of your body weight as you lift yourself up. 4. Hold for __________ seconds. 5. Slowly lower yourself back into the chair. Repeat __________ times. Complete this exercise __________ times a  day. Exercise L: Wall Push-Ups  1. Stand so you are facing a stable wall. Your feet should be about one arm-length away from the wall. 2. Lean forward and place your palms on the wall at shoulder height. 3. Keep your feet flat on the floor as you bend your elbows and lean forward toward the wall. 4. Hold for __________ seconds. 5. Straighten your elbows to push yourself back to the starting position. Repeat __________ times. Complete this exercise __________ times a day. This information is not intended to replace advice   given to you by your health care provider. Make sure you discuss any questions you have with your health care provider. Document Released: 01/24/2005 Document Revised: 12/05/2015 Document Reviewed: 11/21/2014 [employer] Interactive Patient Education  2018 [employer]  

## 2018-02-14 ENCOUNTER — Other Ambulatory Visit: Payer: Self-pay | Admitting: Internal Medicine

## 2018-02-14 MED ORDER — PREDNISONE 10 MG PO TABS: ORAL_TABLET | ORAL | 0 refills | Status: DC

## 2018-02-19 ENCOUNTER — Ambulatory Visit
Admission: RE | Admit: 2018-02-19 | Discharge: 2018-02-19 | Disposition: A | Discharge status: Home / Self Care | Payer: Medicare HMO | Source: Ambulatory Visit | Attending: Oncology | Admitting: Oncology

## 2018-02-19 DIAGNOSIS — C220 Liver cell carcinoma: Secondary | ICD-10-CM | POA: Diagnosis not present

## 2018-02-19 DIAGNOSIS — R69 Illness, unspecified: Secondary | ICD-10-CM | POA: Diagnosis not present

## 2018-02-19 HISTORY — PX: IR RADIOLOGIST EVAL & MGMT: IMG5224

## 2018-02-19 NOTE — Consult Note (Signed)
Chief Complaint:   Cirrhosis, multifocal hepatocellular carcinoma in the setting of elevated AFP  Referring Physician(s): Finnegan,Timothy J  History of Present Illness: [name] is a 62 y.o. female who has advanced cirrhosis secondary to alcoholism and hepatitis C.  Surveillance imaging demonstrated enlarging multifocal hepatic lesions compatible with hepatocellular carcinoma by MRI.  She has lesions in both lobes.  AFP is also increased up to 176.  Despite this, she has an excellent functional status.  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  No recent abdominal pain, fever, or jaundice.  No signs of liver failure.  She has never required a paracentesis.  Stable appetite and weight.  No current chest pain, abdominal pain or shortness of breath.  No significant nausea, vomiting or bowel habit change.  No signs of encephalopathy.  Past Medical History:  Diagnosis Date  . Acquired hypothyroidism    thyroid goiter s/p thyroidectomy  . Allergy   . Bipolar 1 disorder ([place]) 1990s  . Chronic hepatitis C (HCC)    genotype 1a  . Compensated HCV cirrhosis ([place])   . Complication of anesthesia    states 15 yrs ago, hard time waking up, states cant breathe when coming out of anesthesia  . Complication of anesthesia    per patient heart stopped  . COPD (chronic obstructive pulmonary disease) ([place])   . Depression    states seen weekly at [facility]  . GERD (gastroesophageal reflux disease)    Hpylori + and treated (2012)  . History of alcohol abuse   . Insomnia   . Migraines   . Pneumonia   . PONV (postoperative nausea and vomiting)    "it all depends on what kind they give me"  . Portal hypertension ([place])   . Portal hypertensive gastropathy ([place]) 2013  . Smoker   . Stroke ([place])   . TIA (transient ischemic attack)   . Varicose veins     Past Surgical History:  Procedure Laterality Date  . APPENDECTOMY    . DIAGNOSTIC LAPAROSCOPY     15 yrs ago   . ELECTROMAGNETIC NAVIGATION BROCHOSCOPY N/A 08/15/2017   Procedure: ELECTROMAGNETIC NAVIGATION BRONCHOSCOPY;  Surgeon: Flora Lipps, MD;  Location: [facility] ORS;  Service: Cardiopulmonary;  Laterality: N/A;  . ESOPHAGOGASTRODUODENOSCOPY  10/2010   nl esophagus, HH, portal hypertensive gastropathy, erosive gastropathy, Hpylori + (Dr. Sydnee Levans)  . foot surgery Bilateral 2017   Callus removal  . IR RADIOLOGIST EVAL & MGMT  02/19/2018  . left foot surgery  1990s  . LIVER BIOPSY    . THYROIDECTOMY  05/17/2011   Procedure: TOTAL THYROIDECTOMY;  Surgeon: Earnstine Regal, MD, benign goiter per path report    Allergies: Demerol; Penicillins; Other; Propofol; Latex; and Oxycodone hcl  Medications: Prior to Admission medications   Medication Sig Start Date End Date Taking? Authorizing Provider  aspirin EC 81 MG tablet Take 1 tablet by mouth daily.   Yes [provider]  atorvastatin (LIPITOR) 10 MG tablet TAKE 1 TABLET BY MOUTH EVERY NIGHT AT BEDTIME 10/23/17  Yes Bedsole, Amy E, MD  benzonatate (TESSALON) 200 MG capsule Take 1 capsule (200 mg total) by mouth 2 (two) times daily as needed for cough. 01/10/18  Yes Bedsole, Amy E, MD  CHANTIX 0.5 MG tablet TAKE 1 TABLET BY MOUTH 2 TIMES DAILY 01/22/18  Yes Bedsole, Amy E, MD  furosemide (LASIX) 20 MG tablet TAKE 2 TABLETS BY MOUTH DAILY 02/05/18  Yes Bedsole,  Amy E, MD  ibuprofen (ADVIL,MOTRIN) 200 MG tablet Take 200-400 mg by mouth daily as needed for headache or moderate pain.   Yes [provider]  lamoTRIgine (LAMICTAL) 25 MG tablet Take 25 mg by mouth daily.  01/04/18  Yes [provider]  lidocaine-prilocaine (EMLA) cream Apply 1 application topically as needed. 08/02/17  Yes Regal, Tamala Fothergill, DPM  methocarbamol (ROBAXIN) 500 MG tablet Take 1 tablet (500 mg total) by mouth every 8 (eight) hours as needed for muscle spasms. 02/10/18  Yes Bedsole, Amy E, MD  QUEtiapine (SEROQUEL) 300 MG tablet Take 300 mg by mouth at bedtime as  needed (sleep).  12/20/16  Yes [provider]  spironolactone (ALDACTONE) 25 MG tablet TAKE 4 TABLETS BY MOUTH DAILY 02/05/18  Yes Bedsole, Amy E, MD  SYNTHROID 88 MCG tablet TAKE 1 TABLET BY MOUTH ONCE DAILY 01/06/18  Yes Bedsole, Amy E, MD  traMADol (ULTRAM) 50 MG tablet Take 1 tablet (50 mg total) by mouth every 8 (eight) hours as needed. 02/07/18  Yes Bedsole, Amy E, MD  traZODone (DESYREL) 100 MG tablet Take 100 mg by mouth at bedtime as needed for sleep.  06/11/15  Yes [provider]  umeclidinium bromide (INCRUSE ELLIPTA) 62.5 MCG/INH AEPB Inhale 1 puff into the lungs daily. 11/20/17  Yes Bedsole, Amy E, MD  predniSONE (DELTASONE) 10 MG tablet Take 3 tabs on days 1-3, take 2 tabs on days 4-6, take 1 tab on days 7-9 02/14/18   Jearld Fenton, NP     Family History  Problem Relation Age of Onset  . Heart disease Mother        pacemaker  . Cancer Father        bladder/melanoma  . Diabetes Father   . Hypertension Father   . Melanoma Sister   . Breast cancer Neg Hx     Social History   Socioeconomic History  . Marital status: Single    Spouse name: Not on file  . Number of children: 0  . Years of education: 86  . Highest education level: High school graduate  Occupational History  . Occupation: unemployed  Social Needs  . Financial resource strain: Not on file  . Food insecurity:    Worry: Not on file    Inability: Not on file  . Transportation needs:    Medical: Not on file    Non-medical: Not on file  Tobacco Use  . Smoking status: Current Some Day Smoker    Packs/day: 0.50    Years: 40.00    Pack years: 20.00    Types: Cigarettes  . Smokeless tobacco: Former Systems developer    Types: Chew  . Tobacco comment: decreasing use 05/30/2011, taking chantix   Substance and Sexual Activity  . Alcohol use: No  . Drug use: No  . Sexual activity: Yes  Lifestyle  . Physical activity:    Days per week: Not on file    Minutes per session: Not on file  . Stress: Not  on file  Relationships  . Social connections:    Talks on phone: Not on file    Gets together: Not on file    Attends religious service: Not on file    Active member of club or organization: Not on file    Attends meetings of clubs or organizations: Not on file    Relationship status: Not on file  Other Topics Concern  . Not on file  Social History Narrative   Lives alone.  Divorced.  No children   Occupation: disability for bipolar   Activity: no regular exercise   Diet: some water, fruits/vegetables occasional   Right-handed.   3 cups caffeine per day.    ECOG Status: 1 - Symptomatic but completely ambulatory  Review of Systems: A 12 point ROS discussed and pertinent positives are indicated in the HPI above.  All other systems are negative.  Review of Systems  Vital Signs: BP 126/64 (BP Location: Right Arm, Patient Position: Sitting, Cuff Size: Normal)   Pulse 72   Temp 98.2 F (36.8 C)   Resp 16   Ht 5\' 3"  (1.6 m)   Wt 67.1 kg   SpO2 98%   BMI 26.22 kg/m   Physical Exam  Constitutional: She is oriented to person, place, and time. She appears well-developed and well-nourished. No distress.  Eyes: Conjunctivae are normal. No scleral icterus.  Cardiovascular: Normal rate and regular rhythm.  Pulmonary/Chest: Effort normal. She has wheezes.  Abdominal: Soft. Bowel sounds are normal. She exhibits no distension. There is no tenderness.  Neurological: She is alert and oriented to person, place, and time.  Skin: Skin is warm and dry. She is not diaphoretic.  Psychiatric: She has a normal mood and affect. Her behavior is normal.    Imaging: Dg Cervical Spine Complete  Result Date: 02/13/2018 CLINICAL DATA:  LEFT arm pain for a week. No injury. History of multifocal hepatocellular carcinoma. EXAM: CERVICAL SPINE - COMPLETE 4+ VIEW COMPARISON:  MRI cervical spine Aug 19, 2017 FINDINGS: Cervical vertebral bodies intact. Minimal grade 1 C4-5 anterolisthesis. Stable  moderate C5-6 mild C3-4 disc height loss with endplate spurring compatible with degenerative discs. Moderate asymmetric LEFT facet arthropathy. Moderate RIGHT C5-6 neural foraminal narrowing (documented is severe on prior MRI). Multiple surgical clips and anterior neck consistent with thyroidectomy. LEFT neck calcifications are likely vascular. IMPRESSION: No acute fracture deformity. Stable minimal grade 1 C4-5 anterolisthesis. Stable degenerative change. Electronically Signed   By: Elon Alas M.D.   On: 02/13/2018 15:23   Nm Pet Image Initial (pi) Skull Base To Thigh  Result Date: 01/31/2018 CLINICAL DATA:  Initial treatment strategy for hepatocellular carcinoma. EXAM: NUCLEAR MEDICINE PET SKULL BASE TO THIGH TECHNIQUE: 8.7 mCi F-18 FDG was injected intravenously. Full-ring PET imaging was performed from the skull base to thigh after the radiotracer. CT data was obtained and used for attenuation correction and anatomic localization. Fasting blood glucose: 99 mg/dl COMPARISON:  Multiple exams, including MRI abdomen from 01/22/2018 and CT chest from 07/22/2017 FINDINGS: Mediastinal blood pool activity: SUV max 2.8 NECK: Mild asymmetry of activity along the expected location of the palatine tonsils, maximum SUV 6.1 on the right and 4.2 on the left, without appreciable CT correlate. This is likely incidental/physiologic. Incidental CT findings: Left common carotid atherosclerotic calcification. Prior thyroidectomy. CHEST: The left upper lobe pulmonary nodule measures 2.3 by 1.3 cm on image 73/3, and also has some peripheral nodularity extending up to the pleura which is not included in this measurement. The nodule was previously cavitary but currently appear solid. The nodule previously measured 2.1 by 1.1 cm by my measurement on 07/22/2017. Maximum standard uptake value is currently 7.0. A lymph node along the left lateral margin of the pulmonary artery measures 0.7 cm in short axis on image 94/3 and has a  maximum SUV of 4.2 which is above the blood pool. This lymph node previously measured 1.0 cm in short axis. Incidental CT findings: Coronary, aortic arch, and branch vessel atherosclerotic vascular disease.  Uphill varices adjacent to the distal esophagus. Probable small hiatal hernia. ABDOMEN/PELVIS: The dominant mass inferiorly in the right hepatic lobe has a maximum SUV of 6.6. A focus of hypermetabolic activity anteriorly in the left hepatic lobe has a maximum SUV of 5.9, with the hypermetabolic activity measuring about 1.8 by 1.8 cm. A second focus of hypermetabolic activity posteriorly in the right hepatic lobe correlating with 1 of the arterial phase enhancing lesions in this region has a maximum SUV of 4.5. A small lesion peripherally in the lateral segment left hepatic lobe has a maximum SUV of 3.8. Background liver activity is about 3.2. Multiple additional foci of hepatocellular carcinoma visible long the arterial phase images from the 01/22/2018 MRI abdomen are relatively occult. The dominant lesion in the dome of the right hepatic lobe measuring 2.3 by 2.0 cm has only very faint metabolic activity, maximum SUV 3.7. Clustered small lymph nodes in the portacaval region have a maximum SUV of 3.9. Splenomegaly is present but no focal splenic lesion is noted. Incidental CT findings: Hepatic cirrhosis. Gastric varices. Aortoiliac atherosclerotic vascular disease. SKELETON: No significant abnormal hypermetabolic activity in this region. Incidental CT findings: Left paracentral disc protrusion at the L4-5 level went with vacuum disc phenomenon. IMPRESSION: 1. Multifocal hepatocellular carcinoma. The dominant mass inferiorly in the right hepatic lobe has a maximum SUV of 6.6. Some of the known focal lesions in the liver are not hypermetabolic and others demonstrate fairly low-grade metabolic activity mildly above that of the surrounding liver. 2. 2.3 by 1.3 cm left apical pulmonary nodule is hypermetabolic with  maximum SUV 7.0. This seems discordant with the prior biopsy results, raising suspicion for a primary lung cancer. There is also a faintly hypermetabolic left mediastinal lymph node adjacent to the left pulmonary artery. 3. Other imaging findings of potential clinical significance: Aortic Atherosclerosis (ICD10-I70.0). Coronary atherosclerosis. Cirrhosis with portal venous hypertension and paraesophageal and gastric varices. Splenomegaly. Left paracentral disc protrusion at L4-5. Electronically Signed   By: Van Clines M.D.   On: 01/31/2018 17:04   Dg Shoulder Left  Result Date: 02/13/2018 CLINICAL DATA:  Left arm pain.  No injury. EXAM: LEFT SHOULDER - 2+ VIEW COMPARISON:  PET-CT 01/31/2018. FINDINGS: Acromioclavicular and glenohumeral degenerative change. No acute bony or joint abnormality identified. No evidence of fracture or dislocation. Previously identified PET positive nodular density in the left upper lung is again noted. Surgical clips noted over the neck. IMPRESSION: 1. Acromioclavicular and glenohumeral degenerative change. No acute bony abnormality identified. No evidence of fracture or dislocation. 2. Previously identified PET positive pulmonary nodule within the left upper lung is again noted. Electronically Signed   By: Marcello Moores  Register   On: 02/13/2018 15:28   Mr Abdomen Mrcp W Wo Contast  Result Date: 01/22/2018 CLINICAL DATA:  Elevated alpha fetoprotein levels. History of cirrhosis. EXAM: MRI ABDOMEN WITHOUT AND WITH CONTRAST (INCLUDING MRCP) TECHNIQUE: Multiplanar multisequence MR imaging of the abdomen was performed both before and after the administration of intravenous contrast. Heavily T2-weighted images of the biliary and pancreatic ducts were obtained, and three-dimensional MRCP images were rendered by post processing. CONTRAST:  7.5 cc of Gadavist. COMPARISON:  None. FINDINGS: Lower chest: No acute findings. Hepatobiliary: The liver is diffusely nodular compatible with  cirrhosis. There are innumerable arterial phase enhancing lesions within both lobes of liver compatible with multifocal hepatocellular carcinoma. Index lesion in segment 8 measures 2.2 cm, image 16/17. This has hyperenhancement with pseudo capsule and washout on the delayed images diagnostic of [place]. Large  confluent mass within segment 5 measures 5.8 x 3.5 by 5.9 cm, image 61/17. This exhibits early hyperenhancement and delayed internal washout, also consistent with [place]. Lesion within the lateral segment of left lobe measures 1.1 cm and has early hyperenhancement and delayed pseudo capsule and washout, image 20/17. Also diagnostic of [place]. Gallbladder normal. No gallbladder wall thickening. No biliary dilatation. Pancreas: No mass, inflammatory changes, or other parenchymal abnormality identified. Spleen:  The spleen is enlarged with a length of 13.6 cm. Adrenals/Urinary Tract: Normal appearance of the adrenal glands. The right kidney is unremarkable. Tiny cyst noted within the inferior pole of left kidney. No mass or hydronephrosis. Stomach/Bowel: Visualized portions within the abdomen are unremarkable. Vascular/Lymphatic: Aortic atherosclerosis without aneurysm. Distal esophageal and gastric varices. The main portal vein appears patent. There may be partial cavernous transformation of the left branch of the portal vein. Prominent aortocaval node measures 8 mm.  No adenopathy. Other:  Trace perihepatic ascites. Musculoskeletal: No suspicious bone lesions identified. IMPRESSION: 1. Exam positive for cirrhosis with multifocal hepatocellular carcinoma involving both lobes of liver. 2. Stigmata of portal venous hypertension including esophageal varices and splenomegaly 3.  Aortic Atherosclerosis (ICD10-I70.0). Electronically Signed   By: Kerby Moors M.D.   On: 01/22/2018 10:53   Ir Radiologist Eval & Mgmt  Result Date: 02/19/2018 Please refer to notes tab for details about interventional procedure. (Op  Note)   Labs:  CBC: Recent Labs    07/30/17 1110 08/15/17 1142 08/15/17 1818  WBC 5.6  --  7.6  HGB 13.7  --  13.7  HCT 40.2  --  40.0  PLT 83* 91* 86*    COAGS: Recent Labs    07/30/17 1110 08/15/17 1818 02/05/18 1309  INR 1.02 1.12 1.08  APTT  --  31 32    BMP: Recent Labs    06/14/17 1204 08/08/17 0823 08/15/17 1818 02/05/18 1309  NA 133* 135 134* 134*  K 4.2 4.1 4.3 4.4  CL 98 102 104 101  CO2 27 25 22 25   GLUCOSE 97 95 157* 76  BUN 22 20 14 10   CALCIUM 9.6 8.4* 8.6* 8.2*  CREATININE 0.96 0.92 0.98 0.84  GFRNONAA  --  >60 >60 >60  GFRAA  --  >60 >60 >60    LIVER FUNCTION TESTS: Recent Labs    06/14/17 1204 08/08/17 0823 08/15/17 1818 02/05/18 1309  BILITOT 1.0 1.8* 1.6* 1.2  AST 29 29 30  62*  ALT 20 20 20 27   ALKPHOS 101 102 94 153*  PROT 8.3 7.8 7.2 7.2  ALBUMIN 4.2 3.8 3.5 3.2*    TUMOR MARKERS: No results for input(s): AFPTM, CEA, CA199, CHROMGRNA in the last 8760 hours.  Assessment and Plan:  Multifocal hepatocellular carcinoma associated with cirrhosis related to alcoholism and hepatitis C.  Overall mild elevation in LFTs but normal bilirubin, less than 1.5.  She does have a mildly elevated AFP of 176.  Today, we discussed liver directed therapies, centered around Y 90 radial embolization.  The Y 90 embolization was reviewed in detail including the pretreatment vascular mapping, embolization to prevent nontarget Y 90 delivery, and hepatopulmonary shunt calculation.  We also discussed proceeding with initially a right lobe therapy followed by left lobe in 1 month interval.  She was accompanied by both her sisters today.  All questions were addressed.  They have a clear understanding of the procedure.  After discussion they would like proceed with scheduling Y 90 embolization.  This will be scheduled the next few  weeks, anticipate therapy in January.  Thank you for this interesting consult.  I greatly enjoyed meeting [name] and look  forward to participating in their care.  A copy of this report was sent to the requesting provider on this date.  Electronically Signed: Greggory Keen 02/19/2018, 3:45 PM   I spent a total of  40 Minutes   in face to face in clinical consultation, greater than 50% of which was counseling/coordinating care for with cirrhosis and multifocal hepatocellular carcinoma

## 2018-02-22 ENCOUNTER — Other Ambulatory Visit: Payer: Self-pay | Admitting: Family Medicine

## 2018-02-24 ENCOUNTER — Other Ambulatory Visit: Payer: Medicare HMO

## 2018-02-24 ENCOUNTER — Ambulatory Visit: Payer: Medicare HMO | Admitting: Oncology

## 2018-02-26 ENCOUNTER — Encounter: Payer: Self-pay | Admitting: Family Medicine

## 2018-02-26 NOTE — Assessment & Plan Note (Signed)
Due to candida... Treat with fluconazole.

## 2018-02-26 NOTE — Assessment & Plan Note (Signed)
Likely viral URI.Marland Kitchen Symptomatic care.

## 2018-02-27 DIAGNOSIS — Z0100 Encounter for examination of eyes and vision without abnormal findings: Secondary | ICD-10-CM | POA: Diagnosis not present

## 2018-03-04 ENCOUNTER — Other Ambulatory Visit (HOSPITAL_COMMUNITY): Payer: Self-pay | Admitting: Interventional Radiology

## 2018-03-04 DIAGNOSIS — C220 Liver cell carcinoma: Secondary | ICD-10-CM

## 2018-03-06 ENCOUNTER — Ambulatory Visit: Payer: Medicare HMO | Admitting: Internal Medicine

## 2018-03-13 DIAGNOSIS — R69 Illness, unspecified: Secondary | ICD-10-CM | POA: Diagnosis not present

## 2018-03-21 ENCOUNTER — Other Ambulatory Visit: Payer: Self-pay

## 2018-03-21 ENCOUNTER — Inpatient Hospital Stay: Payer: Medicare HMO

## 2018-03-21 ENCOUNTER — Inpatient Hospital Stay
Admission: AD | Admit: 2018-03-21 | Discharge: 2018-03-23 | DRG: 372 | Disposition: A | Discharge status: Home / Self Care | Payer: Medicare HMO | Source: Ambulatory Visit | Attending: Specialist | Admitting: Specialist

## 2018-03-21 ENCOUNTER — Encounter: Payer: Self-pay | Admitting: Nurse Practitioner

## 2018-03-21 ENCOUNTER — Ambulatory Visit
Admission: RE | Admit: 2018-03-21 | Discharge: 2018-03-21 | Disposition: A | Discharge status: Home / Self Care | Payer: Medicare HMO | Source: Ambulatory Visit | Attending: Nurse Practitioner | Admitting: Nurse Practitioner

## 2018-03-21 ENCOUNTER — Telehealth: Payer: Self-pay | Admitting: *Deleted

## 2018-03-21 ENCOUNTER — Ambulatory Visit: Payer: Medicare HMO

## 2018-03-21 ENCOUNTER — Inpatient Hospital Stay: Payer: Medicare HMO | Attending: Nurse Practitioner | Admitting: Nurse Practitioner

## 2018-03-21 ENCOUNTER — Observation Stay: Payer: Medicare HMO

## 2018-03-21 VITALS — BP 117/63 | HR 93 | Temp 101.0°F | Resp 24 | Wt 159.0 lb

## 2018-03-21 DIAGNOSIS — Z720 Tobacco use: Secondary | ICD-10-CM | POA: Diagnosis not present

## 2018-03-21 DIAGNOSIS — Z7982 Long term (current) use of aspirin: Secondary | ICD-10-CM

## 2018-03-21 DIAGNOSIS — J449 Chronic obstructive pulmonary disease, unspecified: Secondary | ICD-10-CM | POA: Diagnosis not present

## 2018-03-21 DIAGNOSIS — R509 Fever, unspecified: Secondary | ICD-10-CM | POA: Diagnosis not present

## 2018-03-21 DIAGNOSIS — Z885 Allergy status to narcotic agent status: Secondary | ICD-10-CM

## 2018-03-21 DIAGNOSIS — F17210 Nicotine dependence, cigarettes, uncomplicated: Secondary | ICD-10-CM | POA: Insufficient documentation

## 2018-03-21 DIAGNOSIS — R319 Hematuria, unspecified: Secondary | ICD-10-CM

## 2018-03-21 DIAGNOSIS — N390 Urinary tract infection, site not specified: Secondary | ICD-10-CM | POA: Diagnosis present

## 2018-03-21 DIAGNOSIS — C220 Liver cell carcinoma: Secondary | ICD-10-CM

## 2018-03-21 DIAGNOSIS — R112 Nausea with vomiting, unspecified: Secondary | ICD-10-CM

## 2018-03-21 DIAGNOSIS — Z79899 Other long term (current) drug therapy: Secondary | ICD-10-CM | POA: Insufficient documentation

## 2018-03-21 DIAGNOSIS — I10 Essential (primary) hypertension: Secondary | ICD-10-CM | POA: Insufficient documentation

## 2018-03-21 DIAGNOSIS — R918 Other nonspecific abnormal finding of lung field: Secondary | ICD-10-CM | POA: Diagnosis not present

## 2018-03-21 DIAGNOSIS — E039 Hypothyroidism, unspecified: Secondary | ICD-10-CM | POA: Diagnosis not present

## 2018-03-21 DIAGNOSIS — R69 Illness, unspecified: Secondary | ICD-10-CM | POA: Diagnosis not present

## 2018-03-21 DIAGNOSIS — I2510 Atherosclerotic heart disease of native coronary artery without angina pectoris: Secondary | ICD-10-CM | POA: Diagnosis not present

## 2018-03-21 DIAGNOSIS — R110 Nausea: Secondary | ICD-10-CM

## 2018-03-21 DIAGNOSIS — K7030 Alcoholic cirrhosis of liver without ascites: Secondary | ICD-10-CM | POA: Insufficient documentation

## 2018-03-21 DIAGNOSIS — Z888 Allergy status to other drugs, medicaments and biological substances status: Secondary | ICD-10-CM

## 2018-03-21 DIAGNOSIS — R188 Other ascites: Secondary | ICD-10-CM

## 2018-03-21 DIAGNOSIS — J189 Pneumonia, unspecified organism: Secondary | ICD-10-CM

## 2018-03-21 DIAGNOSIS — B182 Chronic viral hepatitis C: Secondary | ICD-10-CM | POA: Insufficient documentation

## 2018-03-21 DIAGNOSIS — R140 Abdominal distension (gaseous): Secondary | ICD-10-CM

## 2018-03-21 DIAGNOSIS — K7460 Unspecified cirrhosis of liver: Secondary | ICD-10-CM | POA: Diagnosis not present

## 2018-03-21 DIAGNOSIS — K652 Spontaneous bacterial peritonitis: Principal | ICD-10-CM | POA: Diagnosis present

## 2018-03-21 DIAGNOSIS — K219 Gastro-esophageal reflux disease without esophagitis: Secondary | ICD-10-CM | POA: Diagnosis present

## 2018-03-21 DIAGNOSIS — E890 Postprocedural hypothyroidism: Secondary | ICD-10-CM | POA: Diagnosis present

## 2018-03-21 DIAGNOSIS — F319 Bipolar disorder, unspecified: Secondary | ICD-10-CM | POA: Insufficient documentation

## 2018-03-21 DIAGNOSIS — Z91040 Latex allergy status: Secondary | ICD-10-CM

## 2018-03-21 DIAGNOSIS — K7031 Alcoholic cirrhosis of liver with ascites: Secondary | ICD-10-CM | POA: Diagnosis present

## 2018-03-21 DIAGNOSIS — Z8673 Personal history of transient ischemic attack (TIA), and cerebral infarction without residual deficits: Secondary | ICD-10-CM

## 2018-03-21 DIAGNOSIS — Z880 Allergy status to penicillin: Secondary | ICD-10-CM

## 2018-03-21 DIAGNOSIS — F1020 Alcohol dependence, uncomplicated: Secondary | ICD-10-CM | POA: Insufficient documentation

## 2018-03-21 LAB — URINALYSIS, COMPLETE (UACMP) WITH MICROSCOPIC
Bilirubin Urine: NEGATIVE
Glucose, UA: NEGATIVE mg/dL
Ketones, ur: NEGATIVE mg/dL
Nitrite: POSITIVE — AB
PH: 5 (ref 5.0–8.0)
Protein, ur: 30 mg/dL — AB
Specific Gravity, Urine: 1.023 (ref 1.005–1.030)

## 2018-03-21 LAB — CBC WITH DIFFERENTIAL/PLATELET
Abs Immature Granulocytes: 0.04 10*3/uL (ref 0.00–0.07)
Basophils Absolute: 0 10*3/uL (ref 0.0–0.1)
Basophils Relative: 0 %
Eosinophils Absolute: 0.1 10*3/uL (ref 0.0–0.5)
Eosinophils Relative: 1 %
HCT: 35.6 % — ABNORMAL LOW (ref 36.0–46.0)
Hemoglobin: 11.4 g/dL — ABNORMAL LOW (ref 12.0–15.0)
Immature Granulocytes: 0 %
Lymphocytes Relative: 6 %
Lymphs Abs: 0.7 10*3/uL (ref 0.7–4.0)
MCH: 28.7 pg (ref 26.0–34.0)
MCHC: 32 g/dL (ref 30.0–36.0)
MCV: 89.7 fL (ref 80.0–100.0)
Monocytes Absolute: 1.6 10*3/uL — ABNORMAL HIGH (ref 0.1–1.0)
Monocytes Relative: 15 %
Neutro Abs: 8.6 10*3/uL — ABNORMAL HIGH (ref 1.7–7.7)
Neutrophils Relative %: 78 %
Platelets: 115 10*3/uL — ABNORMAL LOW (ref 150–400)
RBC: 3.97 MIL/uL (ref 3.87–5.11)
RDW: 15.4 % (ref 11.5–15.5)
WBC: 11 10*3/uL — ABNORMAL HIGH (ref 4.0–10.5)
nRBC: 0 % (ref 0.0–0.2)

## 2018-03-21 LAB — COMPREHENSIVE METABOLIC PANEL WITH GFR
ALT: 77 U/L — ABNORMAL HIGH (ref 0–44)
AST: 180 U/L — ABNORMAL HIGH (ref 15–41)
Albumin: 3 g/dL — ABNORMAL LOW (ref 3.5–5.0)
Alkaline Phosphatase: 323 U/L — ABNORMAL HIGH (ref 38–126)
Anion gap: 10 (ref 5–15)
BUN: 13 mg/dL (ref 8–23)
CO2: 20 mmol/L — ABNORMAL LOW (ref 22–32)
Calcium: 8 mg/dL — ABNORMAL LOW (ref 8.9–10.3)
Chloride: 102 mmol/L (ref 98–111)
Creatinine, Ser: 0.77 mg/dL (ref 0.44–1.00)
GFR calc Af Amer: >60 mL/min
GFR calc non Af Amer: >60 mL/min
Glucose, Bld: 141 mg/dL — ABNORMAL HIGH (ref 70–99)
Potassium: 3.7 mmol/L (ref 3.5–5.1)
Sodium: 132 mmol/L — ABNORMAL LOW (ref 135–145)
Total Bilirubin: 2.1 mg/dL — ABNORMAL HIGH (ref 0.3–1.2)
Total Protein: 7.6 g/dL (ref 6.5–8.1)

## 2018-03-21 LAB — MAGNESIUM: Magnesium: 1.9 mg/dL (ref 1.7–2.4)

## 2018-03-21 MED ORDER — FUROSEMIDE 40 MG PO TABS
40.0000 mg | ORAL_TABLET | Freq: Every day | ORAL | Status: DC
  Administered 2018-03-21 – 2018-03-23 (×3): 40 mg via ORAL
  Filled 2018-03-21 (×3): qty 1

## 2018-03-21 MED ORDER — VANCOMYCIN HCL IN DEXTROSE 1-5 GM/200ML-% IV SOLN
1000.0000 mg | Freq: Once | INTRAVENOUS | Status: AC
  Administered 2018-03-22: 1000 mg via INTRAVENOUS
  Filled 2018-03-21: qty 200

## 2018-03-21 MED ORDER — SODIUM CHLORIDE 0.9% FLUSH: 3.0000 mL | INTRAVENOUS | Status: DC | PRN

## 2018-03-21 MED ORDER — UMECLIDINIUM BROMIDE 62.5 MCG/INH IN AEPB
1.0000 | INHALATION_SPRAY | Freq: Every day | RESPIRATORY_TRACT | Status: DC
  Filled 2018-03-21: qty 7

## 2018-03-21 MED ORDER — SODIUM CHLORIDE 0.9 % IV SOLN
Freq: Once | INTRAVENOUS | Status: AC
  Administered 2018-03-21: 16:00:00 via INTRAVENOUS
  Filled 2018-03-21: qty 250

## 2018-03-21 MED ORDER — ONDANSETRON HCL 4 MG PO TABS
4.0000 mg | ORAL_TABLET | Freq: Four times a day (QID) | ORAL | Status: DC | PRN
  Administered 2018-03-23: 06:00:00 4 mg via ORAL
  Filled 2018-03-21: qty 1

## 2018-03-21 MED ORDER — LEVOTHYROXINE SODIUM 88 MCG PO TABS
88.0000 ug | ORAL_TABLET | Freq: Every day | ORAL | Status: DC
  Administered 2018-03-22 – 2018-03-23 (×2): 88 ug via ORAL
  Filled 2018-03-21 (×2): qty 1

## 2018-03-21 MED ORDER — LIDOCAINE-PRILOCAINE 2.5-2.5 % EX CREA
1.0000 "application " | TOPICAL_CREAM | Freq: Every day | CUTANEOUS | Status: DC
  Administered 2018-03-21: 1 via TOPICAL
  Filled 2018-03-21 (×2): qty 5

## 2018-03-21 MED ORDER — SODIUM CHLORIDE 0.9 % IV SOLN
250.0000 mL | INTRAVENOUS | Status: DC | PRN
  Administered 2018-03-22: 500 mL via INTRAVENOUS

## 2018-03-21 MED ORDER — SENNOSIDES-DOCUSATE SODIUM 8.6-50 MG PO TABS: 1.0000 | ORAL_TABLET | Freq: Every evening | ORAL | Status: DC | PRN

## 2018-03-21 MED ORDER — SPIRONOLACTONE 25 MG PO TABS
100.0000 mg | ORAL_TABLET | Freq: Every day | ORAL | Status: DC
  Administered 2018-03-21 – 2018-03-23 (×3): 100 mg via ORAL
  Filled 2018-03-21 (×3): qty 4

## 2018-03-21 MED ORDER — NICOTINE 21 MG/24HR TD PT24
21.0000 mg | MEDICATED_PATCH | Freq: Every day | TRANSDERMAL | Status: DC
  Administered 2018-03-21 – 2018-03-23 (×3): 21 mg via TRANSDERMAL
  Filled 2018-03-21 (×3): qty 1

## 2018-03-21 MED ORDER — VANCOMYCIN HCL IN DEXTROSE 750-5 MG/150ML-% IV SOLN
750.0000 mg | Freq: Two times a day (BID) | INTRAVENOUS | Status: DC
  Administered 2018-03-22 – 2018-03-23 (×3): 750 mg via INTRAVENOUS
  Filled 2018-03-21 (×4): qty 150

## 2018-03-21 MED ORDER — ONDANSETRON HCL 4 MG/2ML IJ SOLN
4.0000 mg | Freq: Once | INTRAMUSCULAR | Status: AC
  Administered 2018-03-21: 4 mg via INTRAVENOUS

## 2018-03-21 MED ORDER — CIPROFLOXACIN IN D5W 400 MG/200ML IV SOLN
400.0000 mg | Freq: Two times a day (BID) | INTRAVENOUS | Status: DC
  Administered 2018-03-22 – 2018-03-23 (×3): 400 mg via INTRAVENOUS
  Filled 2018-03-21 (×6): qty 200

## 2018-03-21 MED ORDER — ALBUMIN HUMAN 25 % IV SOLN
25.0000 g | Freq: Once | INTRAVENOUS | Status: AC
  Administered 2018-03-22: 25 g via INTRAVENOUS
  Filled 2018-03-21: qty 100

## 2018-03-21 MED ORDER — ONDANSETRON HCL 4 MG/2ML IJ SOLN
4.0000 mg | Freq: Four times a day (QID) | INTRAMUSCULAR | Status: DC | PRN
  Administered 2018-03-22: 20:00:00 4 mg via INTRAVENOUS
  Filled 2018-03-21: qty 2

## 2018-03-21 MED ORDER — ONDANSETRON HCL 4 MG/2ML IJ SOLN
INTRAMUSCULAR | Status: AC
  Filled 2018-03-21: qty 2

## 2018-03-21 MED ORDER — SODIUM CHLORIDE 0.9% FLUSH
3.0000 mL | Freq: Two times a day (BID) | INTRAVENOUS | Status: DC
  Administered 2018-03-22 – 2018-03-23 (×3): 3 mL via INTRAVENOUS

## 2018-03-21 NOTE — H&P (Addendum)
[facility] [place] NAME: [name]    MR#:  [id number]  DATE OF BIRTH:  Nov 22, 1955  DATE OF ADMISSION:  03/21/2018  PRIMARY CARE PHYSICIAN: Jinny Sanders, MD   REQUESTING/REFERRING PHYSICIAN:   CHIEF COMPLAINT: Referred from oncology clinic for fever  HISTORY OF PRESENT ILLNESS: [name]  is a 62 y.o. female with a known history of cirrhosis of liver, hepatocellular cancer, COPD, chronic hepatitis C, GERD, H. pylori gastritis treated in the past, CVA, tobacco abuse resented to hospital after being referred from oncology clinic.  Patient had fever since 1 day.  She has dysuria.  Has abdominal distention from ascites.  Her LFTs are mildly elevated.  Patient was referred for abdominal paracentesis for further evaluation of any peritonitis and IV antibiotics.  Had some nausea but that improved with Zofran.  PAST MEDICAL HISTORY:   Past Medical History:  Diagnosis Date  . Acquired hypothyroidism    thyroid goiter s/p thyroidectomy  . Allergy   . Bipolar 1 disorder ([place]) 1990s  . Chronic hepatitis C (HCC)    genotype 1a  . Compensated HCV cirrhosis ([place])   . Complication of anesthesia    states 15 yrs ago, hard time waking up, states cant breathe when coming out of anesthesia  . Complication of anesthesia    per patient heart stopped  . COPD (chronic obstructive pulmonary disease) ([place])   . Depression    states seen weekly at [facility]  . GERD (gastroesophageal reflux disease)    Hpylori + and treated (2012)  . History of alcohol abuse   . Insomnia   . Migraines   . Pneumonia   . PONV (postoperative nausea and vomiting)    "it all depends on what kind they give me"  . Portal hypertension ([place])   . Portal hypertensive gastropathy ([place]) 2013  . Smoker   . Stroke ([place])   . TIA (transient ischemic attack)   . Varicose veins     PAST SURGICAL HISTORY:  Past Surgical History:  Procedure Laterality Date  .  APPENDECTOMY    . DIAGNOSTIC LAPAROSCOPY     15 yrs ago  . ELECTROMAGNETIC NAVIGATION BROCHOSCOPY N/A 08/15/2017   Procedure: ELECTROMAGNETIC NAVIGATION BRONCHOSCOPY;  Surgeon: Flora Lipps, MD;  Location: [facility] ORS;  Service: Cardiopulmonary;  Laterality: N/A;  . ESOPHAGOGASTRODUODENOSCOPY  10/2010   nl esophagus, HH, portal hypertensive gastropathy, erosive gastropathy, Hpylori + (Dr. Sydnee Levans)  . foot surgery Bilateral 2017   Callus removal  . IR RADIOLOGIST EVAL & MGMT  02/19/2018  . left foot surgery  1990s  . LIVER BIOPSY    . THYROIDECTOMY  05/17/2011   Procedure: TOTAL THYROIDECTOMY;  Surgeon: Earnstine Regal, MD, benign goiter per path report    SOCIAL HISTORY:  Social History   Tobacco Use  . Smoking status: Current Some Day Smoker    Packs/day: 0.50    Years: 40.00    Pack years: 20.00    Types: Cigarettes  . Smokeless tobacco: Former Systems developer    Types: Chew  . Tobacco comment: decreasing use 05/30/2011, taking chantix   Substance Use Topics  . Alcohol use: No    FAMILY HISTORY:  Family History  Problem Relation Age of Onset  . Heart disease Mother        pacemaker  . Cancer Father        bladder/melanoma  . Diabetes Father   . Hypertension Father   .  Melanoma Sister   . Breast cancer Neg Hx     DRUG ALLERGIES:  Allergies  Allergen Reactions  . Demerol Anaphylaxis  . Penicillins Anaphylaxis, Hives and Itching    Has patient had a PCN reaction causing immediate rash, facial/tongue/throat swelling, SOB or lightheadedness with hypotension: Yes Has patient had a PCN reaction causing severe rash involving mucus membranes or skin necrosis: Yes Has patient had a PCN reaction that required hospitalization: Yes Has patient had a PCN reaction occurring within the last 10 years: No If all of the above answers are "NO", then may proceed with Cephalosporin use.   . Other Other (See Comments)    general anesthesia - heart stopped   . Propofol Other (See Comments)     Allergic, caused her to code   . Latex Rash  . Oxycodone Hcl Rash    REVIEW OF SYSTEMS:   CONSTITUTIONAL: No fever, fatigue or weakness.  EYES: No blurred or double vision.  EARS, NOSE, AND THROAT: No tinnitus or ear pain.  RESPIRATORY: No cough, shortness of breath, wheezing or hemoptysis.  CARDIOVASCULAR: No chest pain, orthopnea, edema.  GASTROINTESTINAL: Had nausea, no vomiting, diarrhea or abdominal pain.  Has abdominal distention GENITOURINARY: Has dysuria, no hematuria.  ENDOCRINE: No polyuria, nocturia,  HEMATOLOGY: No anemia, easy bruising or bleeding SKIN: No rash or lesion. MUSCULOSKELETAL: No joint pain or arthritis.   NEUROLOGIC: No tingling, numbness, weakness.  PSYCHIATRY: No anxiety or depression.   MEDICATIONS AT HOME:  Prior to Admission medications   Medication Sig Start Date End Date Taking? Authorizing Provider  aspirin EC 81 MG tablet Take 1 tablet by mouth daily.    [provider]  atorvastatin (LIPITOR) 10 MG tablet TAKE 1 TABLET BY MOUTH EVERY NIGHT AT BEDTIME 10/23/17   Bedsole, Amy E, MD  CHANTIX 0.5 MG tablet TAKE 1 TABLET BY MOUTH 2 TIMES DAILY 01/22/18   Bedsole, Amy E, MD  furosemide (LASIX) 20 MG tablet TAKE 2 TABLETS BY MOUTH DAILY 02/05/18   Bedsole, Amy E, MD  ibuprofen (ADVIL,MOTRIN) 200 MG tablet Take 200-400 mg by mouth daily as needed for headache or moderate pain.    [provider]  lidocaine-prilocaine (EMLA) cream Apply 1 application topically as needed. 08/02/17   Wallene Huh, DPM  spironolactone (ALDACTONE) 25 MG tablet TAKE 4 TABLETS BY MOUTH DAILY 02/05/18   Bedsole, Amy E, MD  SYNTHROID 88 MCG tablet TAKE 1 TABLET BY MOUTH ONCE DAILY 01/06/18   Bedsole, Amy E, MD  umeclidinium bromide (INCRUSE ELLIPTA) 62.5 MCG/INH AEPB Inhale 1 puff into the lungs daily. 11/20/17   Bedsole, Amy E, MD      PHYSICAL EXAMINATION:   VITAL SIGNS: Blood pressure (!) 112/58, pulse 100, temperature 99.8 F (37.7 C), temperature  source Oral, resp. rate 20, SpO2 94 %.  GENERAL:  63 y.o.-year-old patient lying in the bed with no acute distress.  EYES: Pupils equal, round, reactive to light and accommodation. No scleral icterus. Extraocular muscles intact.  HEENT: Head atraumatic, normocephalic. Oropharynx and nasopharynx clear.  NECK:  Supple, no jugular venous distention. No thyroid enlargement, no tenderness.  LUNGS: Normal breath sounds bilaterally, no wheezing, rales,rhonchi or crepitation. No use of accessory muscles of respiration.  CARDIOVASCULAR: S1, S2 normal. No murmurs, rubs, or gallops.  ABDOMEN: Soft, nontender, distended. Bowel sounds present. No organomegaly or mass.  EXTREMITIES: No pedal edema, cyanosis, or clubbing.  NEUROLOGIC: Cranial nerves II through XII are intact. Muscle strength 5/5 in all extremities. Sensation intact.  Gait not checked.  PSYCHIATRIC: The patient is alert and oriented x 3.  SKIN: No obvious rash, lesion, or ulcer.   LABORATORY PANEL:   CBC Recent Labs  Lab 03/21/18 1407  WBC 11.0*  HGB 11.4*  HCT 35.6*  PLT 115*  MCV 89.7  MCH 28.7  MCHC 32.0  RDW 15.4  LYMPHSABS 0.7  MONOABS 1.6*  EOSABS 0.1  BASOSABS 0.0   ------------------------------------------------------------------------------------------------------------------  Chemistries  Recent Labs  Lab 03/21/18 1407  NA 132*  K 3.7  CL 102  CO2 20*  GLUCOSE 141*  BUN 13  CREATININE 0.77  CALCIUM 8.0*  MG 1.9  AST 180*  ALT 77*  ALKPHOS 323*  BILITOT 2.1*   ------------------------------------------------------------------------------------------------------------------ estimated creatinine clearance is 68.5 mL/min (by C-G formula based on SCr of 0.77 mg/dL). ------------------------------------------------------------------------------------------------------------------ No results for input(s): TSH, T4TOTAL, T3FREE, THYROIDAB in the last 72 hours.  Invalid input(s): FREET3   Coagulation  profile No results for input(s): INR, PROTIME in the last 168 hours. ------------------------------------------------------------------------------------------------------------------- No results for input(s): DDIMER in the last 72 hours. -------------------------------------------------------------------------------------------------------------------  Cardiac Enzymes No results for input(s): CKMB, TROPONINI, MYOGLOBIN in the last 168 hours.  Invalid input(s): CK ------------------------------------------------------------------------------------------------------------------ Invalid input(s): POCBNP  ---------------------------------------------------------------------------------------------------------------  Urinalysis    Component Value Date/Time   COLORURINE AMBER (A) 03/21/2018 1505   APPEARANCEUR CLEAR (A) 03/21/2018 1505   LABSPEC 1.023 03/21/2018 1505   PHURINE 5.0 03/21/2018 1505   GLUCOSEU NEGATIVE 03/21/2018 1505   HGBUR SMALL (A) 03/21/2018 1505   BILIRUBINUR NEGATIVE 03/21/2018 1505   BILIRUBINUR negative 12/12/2016 1238   KETONESUR NEGATIVE 03/21/2018 1505   PROTEINUR 30 (A) 03/21/2018 1505   UROBILINOGEN 1.0 12/12/2016 1238   NITRITE POSITIVE (A) 03/21/2018 1505   LEUKOCYTESUR LARGE (A) 03/21/2018 1505     RADIOLOGY: Dg Abd 1 View  Result Date: 03/21/2018 CLINICAL DATA:  Abdominal pain, distention, nausea, vomiting x3-4 months, worse x2-3 days. Hx of hepatitis, GERD, HTN. EXAM: ABDOMEN - 1 VIEW COMPARISON:  PET-CT on 01/31/2018 FINDINGS: Spleen is enlarged. Bowel gas pattern is nonobstructive. No suspicious lytic or blastic lesions are identified. IMPRESSION: Nonobstructed bowel-gas pattern. Splenomegaly. Electronically Signed   By: Nolon Nations M.D.   On: 03/21/2018 14:05    EKG: Orders placed or performed during the hospital encounter of 08/15/17  . EKG 12-Lead  . EKG 12-Lead  . ED EKG  . ED EKG    IMPRESSION AND PLAN:  62 year old female  patient with history of hepatitis C, cirrhosis of liver, hepatocellular carcinoma, GERD, H. pylori infection in the past, CVA, tobacco abuse referred from oncology clinic for fever  -Fever Rule out SBP Check blood cultures Could be from UTI Antibiotics will be initiated CXR to rule out pneumonia  -Acute UTI Start patient on IV cefepime antibiotic and follow-up with cultures  -Ascites with cirrhosis of liver Ultrasound-guided paracentesis to rule out SBP Broad-spectrum IV antibiotics with IV vancomycin and cefepime Gastroenterology consultation  -Hepatocellular cancer History of chronic hepatitis C GI follow-up Oncology follow-up as outpatient  -Tobacco abuse Tobacco cessation counseled with patient Nicotine patch offered  -DVT prophylaxis sequential compression device to lower extremities  All the records are reviewed and case discussed with ED provider. Management plans discussed with the patient, family and they are in agreement.  CODE STATUS:Full code    Code Status Orders  (From admission, onward)         Start     Ordered   03/21/18 1821  Full code  Continuous  03/21/18 1820        Code Status History    This patient has a current code status but no historical code status.       TOTAL TIME TAKING CARE OF THIS PATIENT: 53 minutes.    Saundra Shelling M.D on 03/21/2018 at 6:25 PM  Between 7am to 6pm - Pager - [phone]  After 6pm go to [email or web address] - password [email or web address] [facility]  Office  [phone]  CC: Primary care physician; Jinny Sanders, MD

## 2018-03-21 NOTE — Progress Notes (Signed)
Symptom Management [place]  Telephone:([phone] [phone]  Patient Care Team: Jinny Sanders, MD as PCP - General (Family Medicine) Clent Jacks, RN as Registered Nurse Lloyd Huger, MD as Medical Oncologist (Medical Oncology)   Name of the patient: [name]  [id number]  1956-02-06   Date of visit: 03/21/18  Diagnosis-cirrhosis, multifocal hepatocellular carcinoma  Chief complaint/ Reason for visit- fever, nausea, vomiting, abdominal pain  Heme/Onc history:  Roshell Brigham is a 62 year old female with history of advanced cirrhosis secondary to alcoholism and hepatitis C.  Surveillance imaging demonstrated enlarging multifocal hepatic lesions compatible with hepatocellular carcinoma by MRI on 01/22/2018.  Lesions in both lobes.  AFP 149 (02/05/18).  PET scan on 01/31/2018 consistent with multifocal hepatocellular carcinoma.  Dominant mass inferiorly in the right hepatic lobe with maximum SUV of 6.6.  Left apical pulmonary nodule is hypermetabolic measuring 2.3 x 1.3 cm and SUV of 7.0.  Faintly hypermetabolic left mediastinal lymph node adjacent to left pulmonary artery.  Other findings: Aortic arteriosclerosis, coronary arteriosclerosis, cirrhosis with portal venous hypertension and paraesophageal and gastric varices.  Splenomegaly.  Left paracentral disc protrusion at L4-5. She initiated sorafenib on 02/01/2018 and was unable to tolerate and stopped after few days.  She was referred to IR for consideration of Y 90. Lung lesion was previously biopsied 08/15/2017 by Dr. Mortimer Fries with [facility].  Following ENB she suffered a CVA.  Given findings on PET scan this was discussed at multidisciplinary case conference and recommendation to continue surveillance.    Hepatocellular carcinoma ([place])   01/26/2018 Initial Diagnosis    Hepatocellular carcinoma ([place])    01/28/2018 Cancer Staging    Staging form: Liver, AJCC 8th Edition - Clinical stage from  01/28/2018: Stage IIIA (cT3, cN0, cM0) - Signed by Lloyd Huger, MD on 01/28/2018     Interval history- [name] is 62 year old female, with above history of cirrhosis and multifocal hepatocellular carcinoma, who presents to symptom management clinic for fever which developed approximately 3-4 days ago and has gradually worsened since that time.  Associated symptoms: Nausea/dry heaves, vomiting, poor oral intake, epigastric abdominal pain rated 9/10, abdominal distention causing shortness of breath.  Per family, who accompanies her today, she is unable to tolerate oral fluids and food consistently.  Per patient last took Tylenol yesterday which did not seem to improve her symptoms.  Per family she has been "rundown" for past week and worsening over past several days.  She has not seen anyone for the symptoms. She denies chest pain, constipation.  Denies urinary complaints.  Denies any wounds, cough, congestion or sick contacts.  ECOG FS:3 - Symptomatic, >50% confined to bed  Review of systems- Review of Systems  Constitutional: Positive for chills, fever and malaise/fatigue.  HENT: Negative.   Eyes: Negative.   Respiratory: Negative.   Cardiovascular: Negative.   Gastrointestinal: Positive for abdominal pain, diarrhea, nausea and vomiting. Negative for blood in stool, constipation, heartburn and melena.  Genitourinary: Negative for dysuria, flank pain, frequency, hematuria and urgency.  Musculoskeletal: Positive for myalgias. Negative for back pain, falls, joint pain and neck pain.  Skin: Negative.   Neurological: Negative.   Endo/Heme/Allergies: Negative.   Psychiatric/Behavioral: The patient is nervous/anxious and has insomnia.        Hx of bipolar- not currently on medication    Current treatment-not currently on treatment.  Plan to start Y 90 in January  Allergies  Allergen Reactions  . Demerol Anaphylaxis  .  Penicillins Anaphylaxis, Hives and Itching    Has patient had a  PCN reaction causing immediate rash, facial/tongue/throat swelling, SOB or lightheadedness with hypotension: Yes Has patient had a PCN reaction causing severe rash involving mucus membranes or skin necrosis: Yes Has patient had a PCN reaction that required hospitalization: Yes Has patient had a PCN reaction occurring within the last 10 years: No If all of the above answers are "NO", then may proceed with Cephalosporin use.   . Other Other (See Comments)    general anesthesia - heart stopped   . Propofol Other (See Comments)    Allergic, caused her to code   . Latex Rash  . Oxycodone Hcl Rash    Past Medical History:  Diagnosis Date  . Acquired hypothyroidism    thyroid goiter s/p thyroidectomy  . Allergy   . Bipolar 1 disorder ([place]) 1990s  . Chronic hepatitis C (HCC)    genotype 1a  . Compensated HCV cirrhosis ([place])   . Complication of anesthesia    states 15 yrs ago, hard time waking up, states cant breathe when coming out of anesthesia  . Complication of anesthesia    per patient heart stopped  . COPD (chronic obstructive pulmonary disease) ([place])   . Depression    states seen weekly at [facility]  . GERD (gastroesophageal reflux disease)    Hpylori + and treated (2012)  . History of alcohol abuse   . Insomnia   . Migraines   . Pneumonia   . PONV (postoperative nausea and vomiting)    "it all depends on what kind they give me"  . Portal hypertension ([place])   . Portal hypertensive gastropathy ([place]) 2013  . Smoker   . Stroke ([place])   . TIA (transient ischemic attack)   . Varicose veins     Past Surgical History:  Procedure Laterality Date  . APPENDECTOMY    . DIAGNOSTIC LAPAROSCOPY     15 yrs ago  . ELECTROMAGNETIC NAVIGATION BROCHOSCOPY N/A 08/15/2017   Procedure: ELECTROMAGNETIC NAVIGATION BRONCHOSCOPY;  Surgeon: Flora Lipps, MD;  Location: [facility] ORS;  Service: Cardiopulmonary;  Laterality: N/A;  . ESOPHAGOGASTRODUODENOSCOPY  10/2010   nl esophagus, HH,  portal hypertensive gastropathy, erosive gastropathy, Hpylori + (Dr. Sydnee Levans)  . foot surgery Bilateral 2017   Callus removal  . IR RADIOLOGIST EVAL & MGMT  02/19/2018  . left foot surgery  1990s  . LIVER BIOPSY    . THYROIDECTOMY  05/17/2011   Procedure: TOTAL THYROIDECTOMY;  Surgeon: Earnstine Regal, MD, benign goiter per path report    Social History   Socioeconomic History  . Marital status: Single    Spouse name: Not on file  . Number of children: 0  . Years of education: 18  . Highest education level: High school graduate  Occupational History  . Occupation: unemployed  Social Needs  . Financial resource strain: Not on file  . Food insecurity:    Worry: Not on file    Inability: Not on file  . Transportation needs:    Medical: Not on file    Non-medical: Not on file  Tobacco Use  . Smoking status: Current Some Day Smoker    Packs/day: 0.50    Years: 40.00    Pack years: 20.00    Types: Cigarettes  . Smokeless tobacco: Former Systems developer    Types: Chew  . Tobacco comment: decreasing use 05/30/2011, taking chantix   Substance and Sexual Activity  . Alcohol use: No  .  Drug use: No  . Sexual activity: Yes  Lifestyle  . Physical activity:    Days per week: Not on file    Minutes per session: Not on file  . Stress: Not on file  Relationships  . Social connections:    Talks on phone: Not on file    Gets together: Not on file    Attends religious service: Not on file    Active member of club or organization: Not on file    Attends meetings of clubs or organizations: Not on file    Relationship status: Not on file  . Intimate partner violence:    Fear of current or ex partner: Not on file    Emotionally abused: Not on file    Physically abused: Not on file    Forced sexual activity: Not on file  Other Topics Concern  . Not on file  Social History Narrative   Lives alone.  Divorced.  No children   Occupation: disability for bipolar   Activity: no regular exercise    Diet: some water, fruits/vegetables occasional   Right-handed.   3 cups caffeine per day.    Family History  Problem Relation Age of Onset  . Heart disease Mother        pacemaker  . Cancer Father        bladder/melanoma  . Diabetes Father   . Hypertension Father   . Melanoma Sister   . Breast cancer Neg Hx      Current Outpatient Medications:  .  aspirin EC 81 MG tablet, Take 1 tablet by mouth daily., Disp: , Rfl:  .  atorvastatin (LIPITOR) 10 MG tablet, TAKE 1 TABLET BY MOUTH EVERY NIGHT AT BEDTIME, Disp: 90 tablet, Rfl: 1 .  CHANTIX 0.5 MG tablet, TAKE 1 TABLET BY MOUTH 2 TIMES DAILY, Disp: 60 tablet, Rfl: 2 .  furosemide (LASIX) 20 MG tablet, TAKE 2 TABLETS BY MOUTH DAILY, Disp: 60 tablet, Rfl: 5 .  ibuprofen (ADVIL,MOTRIN) 200 MG tablet, Take 200-400 mg by mouth daily as needed for headache or moderate pain., Disp: , Rfl:  .  spironolactone (ALDACTONE) 25 MG tablet, TAKE 4 TABLETS BY MOUTH DAILY, Disp: 120 tablet, Rfl: 5 .  SYNTHROID 88 MCG tablet, TAKE 1 TABLET BY MOUTH ONCE DAILY, Disp: 90 tablet, Rfl: 3 .  umeclidinium bromide (INCRUSE ELLIPTA) 62.5 MCG/INH AEPB, Inhale 1 puff into the lungs daily., Disp: 30 each, Rfl: 5 .  lidocaine-prilocaine (EMLA) cream, Apply 1 application topically as needed., Disp: 30 g, Rfl: 2  Physical exam:  Vitals:   03/21/18 1432  BP: 117/63  Pulse: 93  Resp: (!) 24  Temp: 99.3 F (37.4 C)  TempSrc: Tympanic  SpO2: 98%   Physical Exam Constitutional:      Appearance: She is ill-appearing.     Comments: Accompanied by 2 sisters  HENT:     Head: Normocephalic and atraumatic.     Mouth/Throat:     Mouth: Mucous membranes are moist.  Cardiovascular:     Rate and Rhythm: Regular rhythm. Tachycardia present.  Pulmonary:     Effort: Pulmonary effort is normal.     Breath sounds: Normal breath sounds.  Abdominal:     Tenderness: There is no rebound.     Comments: Rounded, firm abdomen. Epigastric tenderness and ruq  Skin:     General: Skin is warm and dry.  Neurological:     Mental Status: She is alert and oriented to person, place, and time.  Psychiatric:  Attention and Perception: Attention normal.        Mood and Affect: Mood is anxious. Affect is labile.        Speech: Speech normal.        Judgment: Judgment is impulsive.      CMP Latest Ref Rng & Units 03/21/2018  Glucose 70 - 99 mg/dL 141(H)  BUN 8 - 23 mg/dL 13  Creatinine 0.44 - 1.00 mg/dL 0.77  Sodium 135 - 145 mmol/L 132(L)  Potassium 3.5 - 5.1 mmol/L 3.7  Chloride 98 - 111 mmol/L 102  CO2 22 - 32 mmol/L 20(L)  Calcium 8.9 - 10.3 mg/dL 8.0(L)  Total Protein 6.5 - 8.1 g/dL 7.6  Total Bilirubin 0.3 - 1.2 mg/dL 2.1(H)  Alkaline Phos 38 - 126 U/L 323(H)  AST 15 - 41 U/L 180(H)  ALT 0 - 44 U/L 77(H)   CBC Latest Ref Rng & Units 03/21/2018  WBC 4.0 - 10.5 K/uL 11.0(H)  Hemoglobin 12.0 - 15.0 g/dL 11.4(L)  Hematocrit 36.0 - 46.0 % 35.6(L)  Platelets 150 - 400 K/uL 115(L)    No images are attached to the encounter.  Dg Abd 1 View  Result Date: 03/21/2018 CLINICAL DATA:  Abdominal pain, distention, nausea, vomiting x3-4 months, worse x2-3 days. Hx of hepatitis, GERD, HTN. EXAM: ABDOMEN - 1 VIEW COMPARISON:  PET-CT on 01/31/2018 FINDINGS: Spleen is enlarged. Bowel gas pattern is nonobstructive. No suspicious lytic or blastic lesions are identified. IMPRESSION: Nonobstructed bowel-gas pattern. Splenomegaly. Electronically Signed   By: Nolon Nations M.D.   On: 03/21/2018 14:05    Assessment and plan- Patient is a 62 y.o. female diagnosed with multifocal hepatocellular carcinoma who presents to symptom management clinic for fever.  1. Hepatocellular Carcinoma-multifocal hepatocellular carcinoma associated with cirrhosis related to alcoholism and hepatitis C. Current plan for liver directed therapy with Y 90 with  Mapping planned for January with Dr. Annamaria Boots. Low suspicion that acute symptoms are related to underlying carcinoma and we can  have her follow up with oncology upon discharge.   2. Fever- UA positive for infection but given abdominal pain, distension, n/v, concern for spontaneous bacterial peritonitis in setting of cirrhosis. WBC 11.0, anc 8.6. LFTs worse- alk phos 323, ast 180, alt 77, bilirubin 2.1. Abdominal flat plate overall unremarkable. Discussed with Dr. Grayland Ormond who recommends hospitalization with GI consult and possibe imaging, abdominal paracentesis with culture of ascitic fluid, blood cultures, and initiation of IV antibiotics. IV fluids initiated in clinic and zofran 4 mg given for nausea and dry heaves.   3. UTI- asymptomatic but possible source of fever (see below). UA positive for nitrites, leukocytes.  Microscopy showed many bacteria. Will send for culture and sensitivity.   4.  Lung nodule-left upper lobe lung nodule biopsied by Dr. Mortimer Fries.  Pathology on 08/15/2017 was negative for malignancy.  Unfortunately patient suffered CVA following ENB.  PET scan on 01/31/2018 showed slight enlargement since previous imaging, now measuring 2.3 x 1.3 cm and hypermetabolic with faintly hypermetabolic left mediastinal lymph node, concerning for primary lung cancer.  This was discussed at multidisciplinary case conference and recommendation given prior CVA and hepatocellular carcinoma was to continue surveillance.  Continue to monitor.  Advised patient of my recommendation for hospitalization and she is agreeable.  Discussed with Dr. Estanislado Pandy who accepts for admission. Follow up with Dr. Grayland Ormond and PCP upon discharge.    Visit Diagnosis 1. Fever, unspecified   2. Hepatocellular carcinoma ([place])   3. Urinary tract infection with hematuria, site unspecified  Patient expressed understanding and was in agreement with this plan. She also understands that She can call clinic at any time with any questions, concerns, or complaints.   Thank you for allowing me to participate in the care of this very pleasant patient.   Beckey Rutter, DNP, AGNP-C [place] [facility] [phone] (work cell) [phone] (office)

## 2018-03-21 NOTE — Telephone Encounter (Signed)
Vicky called reporting that patient is having abdominal bloating, diarrhea stools times 2 since last night and nausea with vomiting. Asking for something to be done for her. Possibly some medicine ordered.  Appointment given for 215 for lab, NP. Please advise what labs need to be drawn

## 2018-03-21 NOTE — Telephone Encounter (Signed)
Per VO L Allen, NP, patient to have flat plate abdominal xray, CMP, CBC, Mag drawn. Called sister back  and patient to come to [facility] at 48[facility]145 for xray before coming for lab, NP visit. In agreement with this plan

## 2018-03-21 NOTE — Progress Notes (Signed)
Advanced care plan.  Purpose of the Encounter: CODE STATUS  Parties in Attendance: Patient and family  Patient's Decision Capacity: Good  Subjective/Patient's story: Patient was referred to the hospital by oncology clinic for fever   Objective/Medical story Patient has history of for cirrhosis of liver hepatocellular carcinoma Currently not on chemotherapy Had fever and needs further evaluation Urine positive for infection needs IV antibiotics   Goals of care determination:  Advance care directives goals of care treatment plan discussed Patient is full resuscitation for now   CODE STATUS: Full code   Time spent discussing advanced care planning: 16 minutes

## 2018-03-21 NOTE — Progress Notes (Signed)
Pharmacy Antibiotic Note  [name] is a 62 y.o. female admitted on 03/21/2018 with bacteremia/UTI.  Pharmacy has been consulted for vancomycin dosing.  Plan: Vancomycin 1000 mg IV x 1 followed in approximately 9 hours (stacked dosing) by vancomycin 750 mg IV Q12H, predicted trough 17 mcg/ml. Pharmacy will continue to follow and adjust as needed to maintain trough 15 to 20 mcg/ml.  Vd 42.2 L, Ke 0.061 hr-1, T1/2 11.3 hr     Temp (24hrs), Avg:100 F (37.8 C), Min:99.3 F (37.4 C), Max:101 F (38.3 C)  Recent Labs  Lab 03/21/18 1407  WBC 11.0*  CREATININE 0.77    Estimated Creatinine Clearance: 68.5 mL/min (by C-G formula based on SCr of 0.77 mg/dL).    Allergies  Allergen Reactions  . Demerol Anaphylaxis  . Penicillins Anaphylaxis, Hives and Itching    Has patient had a PCN reaction causing immediate rash, facial/tongue/throat swelling, SOB or lightheadedness with hypotension: Yes Has patient had a PCN reaction causing severe rash involving mucus membranes or skin necrosis: Yes Has patient had a PCN reaction that required hospitalization: Yes Has patient had a PCN reaction occurring within the last 10 years: No If all of the above answers are "NO", then may proceed with Cephalosporin use.   . Other Other (See Comments)    general anesthesia - heart stopped   . Propofol Other (See Comments)    Allergic, caused her to code   . Latex Rash  . Oxycodone Hcl Rash    Antimicrobials this admission:   Dose adjustments this admission:   Microbiology results:  BCx:   UCx:    Sputum:    MRSA PCR:   Thank you for allowing pharmacy to be a part of this patient's care.  Laural Benes, PharmD, BCPS Clinical Pharmacist 03/21/2018 7:09 PM

## 2018-03-22 ENCOUNTER — Observation Stay: Payer: Medicare HMO

## 2018-03-22 DIAGNOSIS — R188 Other ascites: Secondary | ICD-10-CM | POA: Diagnosis not present

## 2018-03-22 DIAGNOSIS — K7031 Alcoholic cirrhosis of liver with ascites: Secondary | ICD-10-CM

## 2018-03-22 DIAGNOSIS — Z91040 Latex allergy status: Secondary | ICD-10-CM | POA: Diagnosis not present

## 2018-03-22 DIAGNOSIS — Z8673 Personal history of transient ischemic attack (TIA), and cerebral infarction without residual deficits: Secondary | ICD-10-CM | POA: Diagnosis not present

## 2018-03-22 DIAGNOSIS — Z885 Allergy status to narcotic agent status: Secondary | ICD-10-CM | POA: Diagnosis not present

## 2018-03-22 DIAGNOSIS — Z888 Allergy status to other drugs, medicaments and biological substances status: Secondary | ICD-10-CM | POA: Diagnosis not present

## 2018-03-22 DIAGNOSIS — Z79899 Other long term (current) drug therapy: Secondary | ICD-10-CM | POA: Diagnosis not present

## 2018-03-22 DIAGNOSIS — R110 Nausea: Secondary | ICD-10-CM

## 2018-03-22 DIAGNOSIS — F17210 Nicotine dependence, cigarettes, uncomplicated: Secondary | ICD-10-CM | POA: Diagnosis present

## 2018-03-22 DIAGNOSIS — R509 Fever, unspecified: Secondary | ICD-10-CM | POA: Diagnosis not present

## 2018-03-22 DIAGNOSIS — N390 Urinary tract infection, site not specified: Secondary | ICD-10-CM | POA: Diagnosis not present

## 2018-03-22 DIAGNOSIS — K652 Spontaneous bacterial peritonitis: Secondary | ICD-10-CM | POA: Diagnosis not present

## 2018-03-22 DIAGNOSIS — Z880 Allergy status to penicillin: Secondary | ICD-10-CM | POA: Diagnosis not present

## 2018-03-22 DIAGNOSIS — K7460 Unspecified cirrhosis of liver: Secondary | ICD-10-CM | POA: Diagnosis not present

## 2018-03-22 DIAGNOSIS — B182 Chronic viral hepatitis C: Secondary | ICD-10-CM | POA: Diagnosis not present

## 2018-03-22 DIAGNOSIS — F319 Bipolar disorder, unspecified: Secondary | ICD-10-CM | POA: Diagnosis present

## 2018-03-22 DIAGNOSIS — K219 Gastro-esophageal reflux disease without esophagitis: Secondary | ICD-10-CM | POA: Diagnosis not present

## 2018-03-22 DIAGNOSIS — Z7982 Long term (current) use of aspirin: Secondary | ICD-10-CM | POA: Diagnosis not present

## 2018-03-22 DIAGNOSIS — R69 Illness, unspecified: Secondary | ICD-10-CM | POA: Diagnosis not present

## 2018-03-22 DIAGNOSIS — E890 Postprocedural hypothyroidism: Secondary | ICD-10-CM | POA: Diagnosis not present

## 2018-03-22 DIAGNOSIS — J449 Chronic obstructive pulmonary disease, unspecified: Secondary | ICD-10-CM | POA: Diagnosis present

## 2018-03-22 DIAGNOSIS — C220 Liver cell carcinoma: Secondary | ICD-10-CM | POA: Diagnosis not present

## 2018-03-22 LAB — COMPREHENSIVE METABOLIC PANEL
ALT: 60 U/L — ABNORMAL HIGH (ref 0–44)
AST: 131 U/L — ABNORMAL HIGH (ref 15–41)
Albumin: 2.5 g/dL — ABNORMAL LOW (ref 3.5–5.0)
Alkaline Phosphatase: 269 U/L — ABNORMAL HIGH (ref 38–126)
Anion gap: 6 (ref 5–15)
BUN: 11 mg/dL (ref 8–23)
CO2: 22 mmol/L (ref 22–32)
Calcium: 7.3 mg/dL — ABNORMAL LOW (ref 8.9–10.3)
Chloride: 102 mmol/L (ref 98–111)
Creatinine, Ser: 0.76 mg/dL (ref 0.44–1.00)
GFR calc Af Amer: >60 mL/min (ref >60)
GFR calc non Af Amer: >60 mL/min (ref >60)
Glucose, Bld: 99 mg/dL (ref 70–99)
POTASSIUM: 3.5 mmol/L (ref 3.5–5.1)
Sodium: 130 mmol/L — ABNORMAL LOW (ref 135–145)
TOTAL PROTEIN: 6.5 g/dL (ref 6.5–8.1)
Total Bilirubin: 2.2 mg/dL — ABNORMAL HIGH (ref 0.3–1.2)

## 2018-03-22 LAB — CBC
HCT: 30.3 % — ABNORMAL LOW (ref 36.0–46.0)
Hemoglobin: 9.6 g/dL — ABNORMAL LOW (ref 12.0–15.0)
MCH: 28.6 pg (ref 26.0–34.0)
MCHC: 31.7 g/dL (ref 30.0–36.0)
MCV: 90.2 fL (ref 80.0–100.0)
Platelets: 78 10*3/uL — ABNORMAL LOW (ref 150–400)
RBC: 3.36 MIL/uL — AB (ref 3.87–5.11)
RDW: 15.4 % (ref 11.5–15.5)
WBC: 6.7 10*3/uL (ref 4.0–10.5)
nRBC: 0 % (ref 0.0–0.2)

## 2018-03-22 MED ORDER — ACETAMINOPHEN 325 MG PO TABS
650.0000 mg | ORAL_TABLET | Freq: Four times a day (QID) | ORAL | Status: DC | PRN
  Administered 2018-03-22 – 2018-03-23 (×4): 650 mg via ORAL
  Filled 2018-03-22 (×4): qty 2

## 2018-03-22 NOTE — Progress Notes (Signed)
[facility] [place] NAME: [name]    MR#:  [id number]  DATE OF BIRTH:  06/04/1955  SUBJECTIVE:   States she is feeling much better this morning. She denies abdominal pain, nausea, or vomiting. No fevers or chills.  REVIEW OF SYSTEMS:  Review of Systems  Constitutional: Negative for chills and fever.  HENT: Negative for congestion and sore throat.   Eyes: Negative for blurred vision and double vision.  Respiratory: Negative for cough and shortness of breath.   Cardiovascular: Negative for chest pain and leg swelling.  Gastrointestinal: Negative for abdominal pain, nausea and vomiting.  Genitourinary: Negative for dysuria and urgency.  Musculoskeletal: Negative for back pain and neck pain.  Neurological: Negative for dizziness and headaches.  Psychiatric/Behavioral: Negative for depression. The patient is not nervous/anxious.     DRUG ALLERGIES:   Allergies  Allergen Reactions  . Demerol Anaphylaxis  . Penicillins Anaphylaxis, Hives and Itching    Has patient had a PCN reaction causing immediate rash, facial/tongue/throat swelling, SOB or lightheadedness with hypotension: Yes Has patient had a PCN reaction causing severe rash involving mucus membranes or skin necrosis: Yes Has patient had a PCN reaction that required hospitalization: Yes Has patient had a PCN reaction occurring within the last 10 years: No If all of the above answers are "NO", then may proceed with Cephalosporin use.   . Other Other (See Comments)    general anesthesia - heart stopped   . Propofol Other (See Comments)    Allergic, caused her to code   . Latex Rash  . Oxycodone Hcl Rash   VITALS:  Blood pressure (!) 100/53, pulse 83, temperature 98.2 F (36.8 C), temperature source Oral, resp. rate 20, height 5\' 6"  (1.676 m), weight 72.1 kg, SpO2 94 %. PHYSICAL EXAMINATION:  Physical Exam  GENERAL:  62 y.o.-year-old patient lying in the bed with no acute  distress.  EYES: Pupils equal, round, reactive to light and accommodation. No scleral icterus. Extraocular muscles intact.  HEENT: Head atraumatic, normocephalic. Oropharynx and nasopharynx clear.  NECK:  Supple, no jugular venous distention. No thyroid enlargement, no tenderness.  LUNGS: Normal breath sounds bilaterally, no wheezing, rales,rhonchi or crepitation. No use of accessory muscles of respiration.  CARDIOVASCULAR: RRR, S1, S2 normal. No murmurs, rubs, or gallops.  ABDOMEN: Soft, nontender, distended. Bowel sounds present. No organomegaly or mass.  EXTREMITIES: No pedal edema, cyanosis, or clubbing.  NEUROLOGIC: Cranial nerves II through XII are intact. Muscle strength 5/5 in all extremities. Sensation intact. Gait not checked.  PSYCHIATRIC: The patient is alert and oriented x 3.  SKIN: No obvious rash, lesion, or ulcer.  LABORATORY PANEL:  Female CBC Recent Labs  Lab 03/22/18 0326  WBC 6.7  HGB 9.6*  HCT 30.3*  PLT 78*   ------------------------------------------------------------------------------------------------------------------ Chemistries  Recent Labs  Lab 03/21/18 1407 03/22/18 0326  NA 132* 130*  K 3.7 3.5  CL 102 102  CO2 20* 22  GLUCOSE 141* 99  BUN 13 11  CREATININE 0.77 0.76  CALCIUM 8.0* 7.3*  MG 1.9  --   AST 180* 131*  ALT 77* 60*  ALKPHOS 323* 269*  BILITOT 2.1* 2.2*   RADIOLOGY:  Dg Chest 2 View  Result Date: 03/21/2018 CLINICAL DATA:  Community acquired pneumonia. EXAM: CHEST - 2 VIEW COMPARISON:  Chest x-ray July 15, 2017, PET-CT January 31, 2018 FINDINGS: The heart size and mediastinal contours are within normal limits. There is a 2 cm nodule in  the left upper lobe described on previous PET-CT. There is no focal pneumonia, pulmonary edema, or pleural effusion. The visualized skeletal structures are unremarkable. IMPRESSION: 2 cm nodule in the left upper lobe described on the recent previous PET-CT. Neoplasm is not excluded. No focal  pneumonia is identified. Electronically Signed   By: Abelardo Diesel M.D.   On: 03/21/2018 19:18   ASSESSMENT AND PLAN:   Fever- likely due to UTI, but will need to rule out SBP. Afebrile since admission. -Plan for paracentesis today -Blood cultures pending -Continue IV antibiotics  Acute UTI- UA consistent with infection -Urine culture pending -Continue IV cipro  Ascites with cirrhosis of liver -Paracentesis as above -GI consult  Hepatocellular cancer- due to history of chronic hepatitis C -Needs GI and oncology f/u as an outpatient  Tobacco abuse -Tobacco cessation counseling performed this admission -Nicotine patch offered  All the records are reviewed and case discussed with Care Management/Social Worker. Management plans discussed with the patient, family and they are in agreement.  CODE STATUS: Full Code  TOTAL TIME TAKING CARE OF THIS PATIENT: 35 minutes.   More than 50% of the time was spent in counseling/coordination of care: YES  POSSIBLE D/C tomorrow, DEPENDING ON CLINICAL CONDITION.   Berna Spare Mayo M.D on 03/22/2018 at 3:27 PM  Between 7am to 6pm - Pager - [phone]  After 6pm go to [email or web address] - Proofreader  [facility]  Office  [phone]  CC: Primary care physician; Jinny Sanders, MD  Note: This dictation was prepared with Dragon dictation along with smaller phrase technology. Any transcriptional errors that result from this process are unintentional.

## 2018-03-22 NOTE — Consult Note (Signed)
Monica Lame, MD [facility]  [address] [place], [place] [address] Phone: [phone] Fax : [phone]  Consultation  Referring Provider:    Dr. Estanislado Pandy Primary Care Physician:  Jinny Sanders, MD Primary Gastroenterologist: [facility]        Reason for Consultation:     Cirrhosis  Date of Admission:  03/21/2018 Date of Consultation:  03/22/2018         HPI:   [name] is a 62 y.o. female with a history of hepatitis C alcohol abuse and cirrhosis.  The patient has been treated for her cirrhosis at [facility] and states that she has been a sustained viral response patient.  The patient was found to have an elevated alpha-fetoprotein and was set up with interventional radiology for multiple liver lesions consistent with hepatocellular carcinoma.  The patient came in after feeling weak and tired for the last few days with a fever.  The patient went to her oncologist and they referred her to the ED.  The patient also has a history of GERD COPD CVA tobacco abuse and ascites.  After being admitted the patient was treated with Zofran for her nausea and states that most of her symptoms went away after being treated for the Zofran.  She states that she is feeling great today and is just waiting to have the paracentesis.  The patient has also been started on antibiotics.  Past Medical History:  Diagnosis Date  . Acquired hypothyroidism    thyroid goiter s/p thyroidectomy  . Allergy   . Bipolar 1 disorder ([place]) 1990s  . Chronic hepatitis C (HCC)    genotype 1a  . Compensated HCV cirrhosis ([place])   . Complication of anesthesia    states 15 yrs ago, hard time waking up, states cant breathe when coming out of anesthesia  . Complication of anesthesia    per patient heart stopped  . COPD (chronic obstructive pulmonary disease) ([place])   . Depression    states seen weekly at [facility]  . GERD (gastroesophageal reflux disease)    Hpylori + and treated (2012)  . History of  alcohol abuse   . Insomnia   . Migraines   . Pneumonia   . PONV (postoperative nausea and vomiting)    "it all depends on what kind they give me"  . Portal hypertension ([place])   . Portal hypertensive gastropathy ([place]) 2013  . Smoker   . Stroke ([place])   . TIA (transient ischemic attack)   . Varicose veins     Past Surgical History:  Procedure Laterality Date  . APPENDECTOMY    . DIAGNOSTIC LAPAROSCOPY     15 yrs ago  . ELECTROMAGNETIC NAVIGATION BROCHOSCOPY N/A 08/15/2017   Procedure: ELECTROMAGNETIC NAVIGATION BRONCHOSCOPY;  Surgeon: Flora Lipps, MD;  Location: [facility] ORS;  Service: Cardiopulmonary;  Laterality: N/A;  . ESOPHAGOGASTRODUODENOSCOPY  10/2010   nl esophagus, HH, portal hypertensive gastropathy, erosive gastropathy, Hpylori + (Dr. Sydnee Levans)  . foot surgery Bilateral 2017   Callus removal  . IR RADIOLOGIST EVAL & MGMT  02/19/2018  . left foot surgery  1990s  . LIVER BIOPSY    . THYROIDECTOMY  05/17/2011   Procedure: TOTAL THYROIDECTOMY;  Surgeon: Earnstine Regal, MD, benign goiter per path report    Prior to Admission medications   Medication Sig Start Date End Date Taking? Authorizing Provider  aspirin EC 81 MG tablet Take 1 tablet by mouth daily.    [provider]  atorvastatin (LIPITOR) 10 MG tablet TAKE 1 TABLET BY MOUTH EVERY NIGHT AT BEDTIME 10/23/17   Bedsole, Amy E, MD  CHANTIX 0.5 MG tablet TAKE 1 TABLET BY MOUTH 2 TIMES DAILY 01/22/18   Bedsole, Amy E, MD  furosemide (LASIX) 20 MG tablet TAKE 2 TABLETS BY MOUTH DAILY 02/05/18   Bedsole, Amy E, MD  ibuprofen (ADVIL,MOTRIN) 200 MG tablet Take 200-400 mg by mouth daily as needed for headache or moderate pain.    [provider]  lidocaine-prilocaine (EMLA) cream Apply 1 application topically as needed. 08/02/17   Wallene Huh, DPM  spironolactone (ALDACTONE) 25 MG tablet TAKE 4 TABLETS BY MOUTH DAILY 02/05/18   Bedsole, Amy E, MD  SYNTHROID 88 MCG tablet TAKE 1 TABLET BY MOUTH ONCE DAILY  01/06/18   Bedsole, Amy E, MD  umeclidinium bromide (INCRUSE ELLIPTA) 62.5 MCG/INH AEPB Inhale 1 puff into the lungs daily. 11/20/17   Jinny Sanders, MD    Family History  Problem Relation Age of Onset  . Heart disease Mother        pacemaker  . Cancer Father        bladder/melanoma  . Diabetes Father   . Hypertension Father   . Melanoma Sister   . Breast cancer Neg Hx      Social History   Tobacco Use  . Smoking status: Current Some Day Smoker    Packs/day: 0.50    Years: 40.00    Pack years: 20.00    Types: Cigarettes  . Smokeless tobacco: Former Systems developer    Types: Chew  . Tobacco comment: decreasing use 05/30/2011, taking chantix   Substance Use Topics  . Alcohol use: No  . Drug use: No    Allergies as of 03/21/2018 - Review Complete 03/21/2018  Allergen Reaction Noted  . Demerol Anaphylaxis 10/03/2010  . Penicillins Anaphylaxis, Hives, and Itching 10/03/2010  . Other Other (See Comments) 08/02/2017  . Propofol Other (See Comments) 10/08/2017  . Latex Rash 08/02/2017  . Oxycodone hcl Rash 11/26/2012    Review of Systems:    All systems reviewed and negative except where noted in HPI.   Physical Exam:  Vital signs in last 24 hours: Temp:  [97.5 F (36.4 C)-101 F (38.3 C)] 98.2 F (36.8 C) (12/28 1234) Pulse Rate:  [79-100] 83 (12/28 1235) Resp:  [18-24] 20 (12/28 1234) BP: (100-121)/(42-63) 100/53 (12/28 1235) SpO2:  [93 %-98 %] 94 % (12/28 1235) Weight:  [72.1 kg] 72.1 kg (12/27 1941) Last BM Date: 03/21/18 General:   Pleasant, cooperative in NAD Head:  Normocephalic and atraumatic. Eyes:   Positive scleral icterus.   Conjunctiva pink. PERRLA. Ears:  Normal auditory acuity. Neck:  Supple; no masses or thyroidomegaly Lungs: Respirations even and unlabored. Lungs clear to auscultation bilaterally.   No wheezes, crackles, or rhonchi.  Heart:  Regular rate and rhythm;  Without murmur, clicks, rubs or gallops Abdomen:  Soft, mildly distended, nontender. Normal  bowel sounds. No appreciable masses or hepatomegaly.  No rebound or guarding.  Rectal:  Not performed. Msk:  Symmetrical without gross deformities.    Extremities:  Without edema, cyanosis or clubbing. Neurologic:  Alert and oriented x3;  grossly normal neurologically. Skin:  Intact without significant lesions or rashes. Cervical Nodes:  No significant cervical adenopathy. Psych:  Alert and cooperative. Normal affect.  LAB RESULTS: Recent Labs    03/21/18 1407 03/22/18 0326  WBC 11.0* 6.7  HGB 11.4* 9.6*  HCT 35.6* 30.3*  PLT 115* 78*  BMET Recent Labs    03/21/18 1407 03/22/18 0326  NA 132* 130*  K 3.7 3.5  CL 102 102  CO2 20* 22  GLUCOSE 141* 99  BUN 13 11  CREATININE 0.77 0.76  CALCIUM 8.0* 7.3*   LFT Recent Labs    03/22/18 0326  PROT 6.5  ALBUMIN 2.5*  AST 131*  ALT 60*  ALKPHOS 269*  BILITOT 2.2*   PT/INR No results for input(s): LABPROT, INR in the last 72 hours.  STUDIES: Dg Chest 2 View  Result Date: 03/21/2018 CLINICAL DATA:  Community acquired pneumonia. EXAM: CHEST - 2 VIEW COMPARISON:  Chest x-ray July 15, 2017, PET-CT January 31, 2018 FINDINGS: The heart size and mediastinal contours are within normal limits. There is a 2 cm nodule in the left upper lobe described on previous PET-CT. There is no focal pneumonia, pulmonary edema, or pleural effusion. The visualized skeletal structures are unremarkable. IMPRESSION: 2 cm nodule in the left upper lobe described on the recent previous PET-CT. Neoplasm is not excluded. No focal pneumonia is identified. Electronically Signed   By: Abelardo Diesel M.D.   On: 03/21/2018 19:18   Dg Abd 1 View  Result Date: 03/21/2018 CLINICAL DATA:  Abdominal pain, distention, nausea, vomiting x3-4 months, worse x2-3 days. Hx of hepatitis, GERD, HTN. EXAM: ABDOMEN - 1 VIEW COMPARISON:  PET-CT on 01/31/2018 FINDINGS: Spleen is enlarged. Bowel gas pattern is nonobstructive. No suspicious lytic or blastic lesions are  identified. IMPRESSION: Nonobstructed bowel-gas pattern. Splenomegaly. Electronically Signed   By: Nolon Nations M.D.   On: 03/21/2018 14:05      Impression / Plan:   Assessment: Active Problems:   SBP (spontaneous bacterial peritonitis) ([place])   UTI (urinary tract infection)   [name] is a 61 y.o. y/o female with hepatocellular carcinoma history of hepatitis C with cirrhosis and alcohol abuse.  The patient reports that she has been treated for her hepatitis C and has responded.  The patient also has a history of alcohol abuse and has stopped that also.  The patient is due to have ablation of her liver lesions by interventional radiology in January.  The patient is also supposed to have her ascites tapped today for a cell count.  The patient's blood cultures have been negative so far.  She does have increased liver enzymes with the AST of 131 and ALT of 60 with a total bilirubin of 2.2.  The patient's white cell count of 11 has gone down to 6.7 today.  Plan:  The patient will be monitored with you while we await the paracentesis for ruling out SBP.  If the patient does have spontaneous bacterial peritonitis she should be treated for this and follow-up with her gastroenterologist as an outpatient.  The patient is asymptomatic at the present time and is feeling much better than when she was admitted.  The patient has been explained the plan and agrees with it.  Thank you for involving me in the care of this patient.      LOS: 1 day   Monica Lame, MD  03/22/2018, 1:34 PM    Note: This dictation was prepared with Dragon dictation along with smaller phrase technology. Any transcriptional errors that result from this process are unintentional.

## 2018-03-22 NOTE — Care Management Obs Status (Signed)
[place] NOTIFICATION   Patient Details  Name: [name] MRN: [id number] Date of Birth: [birth year]   Medicare Observation Status Notification Given:  Yes    Kamdin Follett A Ah Bott, RN 03/22/2018, 10:22 AM

## 2018-03-23 LAB — CBC
HCT: 31.6 % — ABNORMAL LOW (ref 36.0–46.0)
HEMOGLOBIN: 10 g/dL — AB (ref 12.0–15.0)
MCH: 28.4 pg (ref 26.0–34.0)
MCHC: 31.6 g/dL (ref 30.0–36.0)
MCV: 89.8 fL (ref 80.0–100.0)
Platelets: 74 10*3/uL — ABNORMAL LOW (ref 150–400)
RBC: 3.52 MIL/uL — AB (ref 3.87–5.11)
RDW: 15.1 % (ref 11.5–15.5)
WBC: 6.7 10*3/uL (ref 4.0–10.5)
nRBC: 0 % (ref 0.0–0.2)

## 2018-03-23 LAB — COMPREHENSIVE METABOLIC PANEL
ALK PHOS: 277 U/L — AB (ref 38–126)
ALT: 71 U/L — ABNORMAL HIGH (ref 0–44)
AST: 198 U/L — ABNORMAL HIGH (ref 15–41)
Albumin: 2.8 g/dL — ABNORMAL LOW (ref 3.5–5.0)
Anion gap: 9 (ref 5–15)
BILIRUBIN TOTAL: 2.5 mg/dL — AB (ref 0.3–1.2)
BUN: 12 mg/dL (ref 8–23)
CO2: 20 mmol/L — ABNORMAL LOW (ref 22–32)
Calcium: 7.3 mg/dL — ABNORMAL LOW (ref 8.9–10.3)
Chloride: 101 mmol/L (ref 98–111)
Creatinine, Ser: 0.79 mg/dL (ref 0.44–1.00)
GFR calc Af Amer: >60 mL/min (ref >60)
GFR calc non Af Amer: >60 mL/min (ref >60)
Glucose, Bld: 135 mg/dL — ABNORMAL HIGH (ref 70–99)
Potassium: 3.4 mmol/L — ABNORMAL LOW (ref 3.5–5.1)
Sodium: 130 mmol/L — ABNORMAL LOW (ref 135–145)
TOTAL PROTEIN: 6.7 g/dL (ref 6.5–8.1)

## 2018-03-23 LAB — HIV ANTIBODY (ROUTINE TESTING W REFLEX): HIV Screen 4th Generation wRfx: NONREACTIVE

## 2018-03-23 MED ORDER — ONDANSETRON HCL 4 MG PO TABS: 4.0000 mg | ORAL_TABLET | Freq: Three times a day (TID) | ORAL | 0 refills | Status: AC | PRN

## 2018-03-23 MED ORDER — CIPROFLOXACIN HCL 250 MG PO TABS: 250.0000 mg | ORAL_TABLET | Freq: Two times a day (BID) | ORAL | 0 refills | Status: AC

## 2018-03-23 NOTE — Discharge Summary (Signed)
[facility] [place] NAME: [name]    MR#:  [id number]  DATE OF BIRTH:  30-Nov-1955  DATE OF ADMISSION:  03/21/2018 ADMITTING PHYSICIAN: Saundra Shelling, MD  DATE OF DISCHARGE: 03/23/2018 11:54 AM  PRIMARY CARE PHYSICIAN: Jinny Sanders, MD    ADMISSION DIAGNOSIS:  fever uti   DISCHARGE DIAGNOSIS:  Active Problems:   SBP (spontaneous bacterial peritonitis) (HCC)   UTI (urinary tract infection)   Nausea without vomiting   Alcoholic cirrhosis of liver with ascites ([place])   Fever   SECONDARY DIAGNOSIS:   Past Medical History:  Diagnosis Date  . Acquired hypothyroidism    thyroid goiter s/p thyroidectomy  . Allergy   . Bipolar 1 disorder ([place]) 1990s  . Chronic hepatitis C (HCC)    genotype 1a  . Compensated HCV cirrhosis ([place])   . Complication of anesthesia    states 15 yrs ago, hard time waking up, states cant breathe when coming out of anesthesia  . Complication of anesthesia    per patient heart stopped  . COPD (chronic obstructive pulmonary disease) ([place])   . Depression    states seen weekly at [facility]  . GERD (gastroesophageal reflux disease)    Hpylori + and treated (2012)  . History of alcohol abuse   . Insomnia   . Migraines   . Pneumonia   . PONV (postoperative nausea and vomiting)    "it all depends on what kind they give me"  . Portal hypertension ([place])   . Portal hypertensive gastropathy ([place]) 2013  . Smoker   . Stroke ([place])   . TIA (transient ischemic attack)   . Varicose veins     HOSPITAL COURSE:   62 year old female with past medical history of chronic liver disease secondary to hepatitis C, hepatocellular carcinoma, GERD, history of migraines, bipolar disorder, history of previous CVA who presented to the hospital due to abdominal pain/fever.  Fever-was secondary to a urinary tract infection.  Fever has now resolved.  There was a thought for possible underlying SBP but patient did not  have enough ascitic fluid for paracentesis. - Patient was empirically treated with IV ciprofloxacin for UTI and now being discharged on oral Cipro.  She is clinically asymptomatic.  Acute UTI- UA consistent with infection -Treated with IV Cipro and now being discharged on oral ciprofloxacin.  Urine cultures remain negative.  Patient is afebrile and hemodynamically stable.  Ascites with cirrhosis of liver -Ultrasound showing not significant ascites that needed paracentesis.  Seen by gastroenterology and they recommend outpatient follow-up with her GI physician.    Hepatocellular cancer- due to history of chronic hepatitis C - cont. Follow up with Outpatient GI for treatment.   Hypothyroidism - pt. Will cont. Her synthroid.   DISCHARGE CONDITIONS:   Stable.   CONSULTS OBTAINED:    DRUG ALLERGIES:   Allergies  Allergen Reactions  . Demerol Anaphylaxis  . Penicillins Anaphylaxis, Hives and Itching    Has patient had a PCN reaction causing immediate rash, facial/tongue/throat swelling, SOB or lightheadedness with hypotension: Yes Has patient had a PCN reaction causing severe rash involving mucus membranes or skin necrosis: Yes Has patient had a PCN reaction that required hospitalization: Yes Has patient had a PCN reaction occurring within the last 10 years: No If all of the above answers are "NO", then may proceed with Cephalosporin use.   . Other Other (See Comments)    general anesthesia - heart stopped   .  Propofol Other (See Comments)    Allergic, caused her to code   . Latex Rash  . Oxycodone Hcl Rash    DISCHARGE MEDICATIONS:   Allergies as of 03/23/2018      Reactions   Demerol Anaphylaxis   Penicillins Anaphylaxis, Hives, Itching   Has patient had a PCN reaction causing immediate rash, facial/tongue/throat swelling, SOB or lightheadedness with hypotension: Yes Has patient had a PCN reaction causing severe rash involving mucus membranes or skin necrosis: Yes Has  patient had a PCN reaction that required hospitalization: Yes Has patient had a PCN reaction occurring within the last 10 years: No If all of the above answers are "NO", then may proceed with Cephalosporin use.   Other Other (See Comments)   general anesthesia - heart stopped    Propofol Other (See Comments)   Allergic, caused her to code    Latex Rash   Oxycodone Hcl Rash      Medication List    STOP taking these medications   atorvastatin 10 MG tablet Commonly known as:  LIPITOR   lidocaine-prilocaine cream Commonly known as:  EMLA     TAKE these medications   aspirin EC 81 MG tablet Take 1 tablet by mouth daily.   CHANTIX 0.5 MG tablet Generic drug:  varenicline TAKE 1 TABLET BY MOUTH 2 TIMES DAILY   ciprofloxacin 250 MG tablet Commonly known as:  CIPRO Take 1 tablet (250 mg total) by mouth 2 (two) times daily for 4 days.   furosemide 20 MG tablet Commonly known as:  LASIX TAKE 2 TABLETS BY MOUTH DAILY   ibuprofen 200 MG tablet Commonly known as:  ADVIL,MOTRIN Take 200-400 mg by mouth daily as needed for headache or moderate pain.   ondansetron 4 MG tablet Commonly known as:  ZOFRAN Take 1 tablet (4 mg total) by mouth every 8 (eight) hours as needed for nausea or vomiting.   spironolactone 25 MG tablet Commonly known as:  ALDACTONE TAKE 4 TABLETS BY MOUTH DAILY   SYNTHROID 88 MCG tablet Generic drug:  levothyroxine TAKE 1 TABLET BY MOUTH ONCE DAILY   umeclidinium bromide 62.5 MCG/INH Aepb Commonly known as:  INCRUSE ELLIPTA Inhale 1 puff into the lungs daily.         DISCHARGE INSTRUCTIONS:   DIET:  Cardiac diet  DISCHARGE CONDITION:  Stable  ACTIVITY:  Activity as tolerated  OXYGEN:  Home Oxygen: No.   Oxygen Delivery: room air  DISCHARGE LOCATION:  home   If you experience worsening of your admission symptoms, develop shortness of breath, life threatening emergency, suicidal or homicidal thoughts you must seek medical attention  immediately by calling 911 or calling your MD immediately  if symptoms less severe.  You Must read complete instructions/literature along with all the possible adverse reactions/side effects for all the Medicines you take and that have been prescribed to you. Take any new Medicines after you have completely understood and accpet all the possible adverse reactions/side effects.   Please note  You were cared for by a hospitalist during your hospital stay. If you have any questions about your discharge medications or the care you received while you were in the hospital after you are discharged, you can call the unit and asked to speak with the hospitalist on call if the hospitalist that took care of you is not available. Once you are discharged, your primary care physician will handle any further medical issues. Please note that NO REFILLS for any discharge medications will be  authorized once you are discharged, as it is imperative that you return to your primary care physician (or establish a relationship with a primary care physician if you do not have one) for your aftercare needs so that they can reassess your need for medications and monitor your lab values.     Today   No acute events overnight.  Abdominal pain resolved.  Denies any dysuria or hematuria.  Afebrile, hemodynamically stable.  Will discharge home on oral ciprofloxacin.  VITAL SIGNS:  Blood pressure (!) 119/56, pulse 82, temperature 98.2 F (36.8 C), temperature source Oral, resp. rate 16, height 5\' 6"  (1.676 m), weight 72.1 kg, SpO2 96 %.  I/O:  No intake or output data in the 24 hours ending 03/23/18 1523  PHYSICAL EXAMINATION:  GENERAL:  62 y.o.-year-old patient lying in the bed with no acute distress.  EYES: Pupils equal, round, reactive to light and accommodation. No scleral icterus. Extraocular muscles intact.  HEENT: Head atraumatic, normocephalic. Oropharynx and nasopharynx clear.  NECK:  Supple, no jugular venous  distention. No thyroid enlargement, no tenderness.  LUNGS: Normal breath sounds bilaterally, no wheezing, rales,rhonchi. No use of accessory muscles of respiration.  CARDIOVASCULAR: S1, S2 normal. No murmurs, rubs, or gallops.  ABDOMEN: Soft, non-tender, non-distended. Bowel sounds present. No organomegaly or mass.  EXTREMITIES: No pedal edema, cyanosis, or clubbing.  NEUROLOGIC: Cranial nerves II through XII are intact. No focal motor or sensory defecits b/l.  PSYCHIATRIC: The patient is alert and oriented x 3.  SKIN: No obvious rash, lesion, or ulcer.   DATA REVIEW:   CBC Recent Labs  Lab 03/23/18 0421  WBC 6.7  HGB 10.0*  HCT 31.6*  PLT 74*    Chemistries  Recent Labs  Lab 03/21/18 1407  03/23/18 0421  NA 132*   < > 130*  K 3.7   < > 3.4*  CL 102   < > 101  CO2 20*   < > 20*  GLUCOSE 141*   < > 135*  BUN 13   < > 12  CREATININE 0.77   < > 0.79  CALCIUM 8.0*   < > 7.3*  MG 1.9  --   --   AST 180*   < > 198*  ALT 77*   < > 71*  ALKPHOS 323*   < > 277*  BILITOT 2.1*   < > 2.5*   < > = values in this interval not displayed.    Cardiac Enzymes No results for input(s): TROPONINI in the last 168 hours.  Microbiology Results  Results for orders placed or performed during the hospital encounter of 03/21/18  CULTURE, BLOOD (ROUTINE X 2) w Reflex to ID Panel     Status: None (Preliminary result)   Collection Time: 03/21/18  7:26 PM  Result Value Ref Range Status   Specimen Description BLOOD BLOOD RIGHT ARM  Final   Special Requests   Final    BOTTLES DRAWN AEROBIC AND ANAEROBIC Blood Culture adequate volume   Culture   Final    NO GROWTH 2 DAYS Performed at [facility], [address], [place], [place] [address]    Report Status PENDING  Incomplete  CULTURE, BLOOD (ROUTINE X 2) w Reflex to ID Panel     Status: None (Preliminary result)   Collection Time: 03/21/18  8:40 PM  Result Value Ref Range Status   Specimen Description BLOOD LEFT FOREARM  Final    Special Requests   Final    BOTTLES  DRAWN AEROBIC AND ANAEROBIC Blood Culture adequate volume   Culture   Final    NO GROWTH 2 DAYS Performed at [facility], [address], [place], [place] [address]    Report Status PENDING  Incomplete    RADIOLOGY:  Dg Chest 2 View  Result Date: 03/21/2018 CLINICAL DATA:  Community acquired pneumonia. EXAM: CHEST - 2 VIEW COMPARISON:  Chest x-ray July 15, 2017, PET-CT January 31, 2018 FINDINGS: The heart size and mediastinal contours are within normal limits. There is a 2 cm nodule in the left upper lobe described on previous PET-CT. There is no focal pneumonia, pulmonary edema, or pleural effusion. The visualized skeletal structures are unremarkable. IMPRESSION: 2 cm nodule in the left upper lobe described on the recent previous PET-CT. Neoplasm is not excluded. No focal pneumonia is identified. Electronically Signed   By: Abelardo Diesel M.D.   On: 03/21/2018 19:18   US Abdomen Limited  Result Date: 03/22/2018 CLINICAL DATA:  62 year old with history of hepatitis-C, cirrhosis and hepatocellular carcinoma. Evaluate for ascites and spontaneous bacterial peritonitis. EXAM: LIMITED ABDOMEN ULTRASOUND FOR ASCITES TECHNIQUE: Limited ultrasound survey for ascites was performed in all four abdominal quadrants. COMPARISON:  PET-CT 01/31/2018 FINDINGS: Spleen is prominent for size without surrounding fluid. There is no ascites identified in the left upper quadrant, left lower quadrant or right lower quadrant. Trace perihepatic ascites near the dome. Liver is heterogeneous and compatible with cirrhosis. IMPRESSION: No significant ascites. Trace ascites around the liver. Paracentesis not performed. Electronically Signed   By: Markus Daft M.D.   On: 03/22/2018 15:55      Management plans discussed with the patient, family and they are in agreement.  CODE STATUS:  Code Status History    Date Active Date Inactive Code Status Order ID Comments User Context    03/21/2018 1820 03/23/2018 1504 Full Code [id number]  Saundra Shelling, MD Inpatient      TOTAL TIME TAKING CARE OF THIS PATIENT: 40 minutes.    Henreitta Leber M.D on 03/23/2018 at 3:23 PM  Between 7am to 6pm - Pager - [phone]  After 6pm go to [email or web address] - Proofreader  [facility]  Office  [phone]  CC: Primary care physician; Jinny Sanders, MD

## 2018-03-23 NOTE — Progress Notes (Signed)
Monica Lame, MD [facility]   [address] [place], [place] [address] Phone: [phone] Fax : [phone]   Subjective: This patient reports that she is doing well today and has no complaints.  She denies any abdominal pain fevers chills nausea vomiting.  The patient does report that she wants to go home.  Patient had an ultrasound for paracentesis and there was too little fluid to take off any ascites.   Objective: Vital signs in last 24 hours: Vitals:   03/22/18 1235 03/22/18 1944 03/23/18 0447 03/23/18 0807  BP: (!) 100/53 108/60 (!) 117/49 (!) 119/56  Pulse: 83 84 81 82  Resp:  20 16   Temp:  98.6 F (37 C) 97.7 F (36.5 C) 98.2 F (36.8 C)  TempSrc:  Oral Oral Oral  SpO2: 94% 97% 95% 96%  Weight:      Height:       Weight change:  No intake or output data in the 24 hours ending 03/23/18 0932   Exam: Heart:: Regular rate and rhythm, S1S2 present or without murmur or extra heart sounds Lungs: normal and clear to auscultation and percussion Abdomen: soft, nontender, normal bowel sounds   Lab Results: @LABTEST2 @ Micro Results: Recent Results (from the past 240 hour(s))  Urine Culture     Status: Abnormal (Preliminary result)   Collection Time: 03/21/18  3:06 PM  Result Value Ref Range Status   Specimen Description   Final    URINE, CLEAN CATCH Performed at [facility], [address], [place], [place] [address]    Special Requests   Final    NONE Performed at [facility], [address], [place], [place] [address]    Culture >=100,000 COLONIES/mL GRAM NEGATIVE RODS (A)  Final   Report Status PENDING  Incomplete  CULTURE, BLOOD (ROUTINE X 2) w Reflex to ID Panel     Status: None (Preliminary result)   Collection Time: 03/21/18  7:26 PM  Result Value Ref Range Status   Specimen Description BLOOD BLOOD RIGHT ARM  Final   Special Requests   Final    BOTTLES DRAWN AEROBIC AND ANAEROBIC Blood Culture adequate volume   Culture   Final   NO GROWTH 2 DAYS Performed at [facility], [address] [place], [place] [address]    Report Status PENDING  Incomplete  CULTURE, BLOOD (ROUTINE X 2) w Reflex to ID Panel     Status: None (Preliminary result)   Collection Time: 03/21/18  8:40 PM  Result Value Ref Range Status   Specimen Description BLOOD LEFT FOREARM  Final   Special Requests   Final    BOTTLES DRAWN AEROBIC AND ANAEROBIC Blood Culture adequate volume   Culture   Final    NO GROWTH 2 DAYS Performed at [facility], [address], [place], [place] [address]    Report Status PENDING  Incomplete   Studies/Results: Dg Chest 2 View  Result Date: 03/21/2018 CLINICAL DATA:  Community acquired pneumonia. EXAM: CHEST - 2 VIEW COMPARISON:  Chest x-ray July 15, 2017, PET-CT January 31, 2018 FINDINGS: The heart size and mediastinal contours are within normal limits. There is a 2 cm nodule in the left upper lobe described on previous PET-CT. There is no focal pneumonia, pulmonary edema, or pleural effusion. The visualized skeletal structures are unremarkable. IMPRESSION: 2 cm nodule in the left upper lobe described on the recent previous PET-CT. Neoplasm is not excluded. No focal pneumonia is identified. Electronically Signed  By: Abelardo Diesel M.D.   On: 03/21/2018 19:18   Dg Abd 1 View  Result Date: 03/21/2018 CLINICAL DATA:  Abdominal pain, distention, nausea, vomiting x3-4 months, worse x2-3 days. Hx of hepatitis, GERD, HTN. EXAM: ABDOMEN - 1 VIEW COMPARISON:  PET-CT on 01/31/2018 FINDINGS: Spleen is enlarged. Bowel gas pattern is nonobstructive. No suspicious lytic or blastic lesions are identified. IMPRESSION: Nonobstructed bowel-gas pattern. Splenomegaly. Electronically Signed   By: Nolon Nations M.D.   On: 03/21/2018 14:05   US Abdomen Limited  Result Date: 03/22/2018 CLINICAL DATA:  62 year old with history of hepatitis-C, cirrhosis and hepatocellular carcinoma. Evaluate for ascites and  spontaneous bacterial peritonitis. EXAM: LIMITED ABDOMEN ULTRASOUND FOR ASCITES TECHNIQUE: Limited ultrasound survey for ascites was performed in all four abdominal quadrants. COMPARISON:  PET-CT 01/31/2018 FINDINGS: Spleen is prominent for size without surrounding fluid. There is no ascites identified in the left upper quadrant, left lower quadrant or right lower quadrant. Trace perihepatic ascites near the dome. Liver is heterogeneous and compatible with cirrhosis. IMPRESSION: No significant ascites. Trace ascites around the liver. Paracentesis not performed. Electronically Signed   By: Markus Daft M.D.   On: 03/22/2018 15:55   Medications: I have reviewed the patient's current medications. Scheduled Meds: . furosemide  40 mg Oral Daily  . levothyroxine  88 mcg Oral Q0600  . lidocaine-prilocaine  1 application Topical Daily  . nicotine  21 mg Transdermal Daily  . sodium chloride flush  3 mL Intravenous Q12H  . spironolactone  100 mg Oral Daily  . umeclidinium bromide  1 puff Inhalation Daily   Continuous Infusions: . sodium chloride Stopped (03/22/18 0627)  . ciprofloxacin 400 mg (03/23/18 0254)   PRN Meds:.sodium chloride, acetaminophen, ondansetron **OR** ondansetron (ZOFRAN) IV, senna-docusate, sodium chloride flush   Assessment: Active Problems:   SBP (spontaneous bacterial peritonitis) (HCC)   UTI (urinary tract infection)   Nausea without vomiting   Alcoholic cirrhosis of liver with ascites (HCC)   Fever    Plan: This patient was admitted with possible SBP but on ultrasound did not have any appreciable ascites.  The patient has been doing better on antibiotics.  The patient has been plugged in with her own gastroenterologist and is being treated for her hepatocellular carcinoma.  Nothing further to do from a GI point of view.  I will sign off.  Please call if any further GI concerns or questions.  We would like to thank you for the opportunity to participate in the care of  [name].      LOS: 2 days   [name] 03/23/2018, 9:32 AM

## 2018-03-23 NOTE — Progress Notes (Signed)
[name] to be D/C'd Home per MD. Discussed with the patient and all questions fully answered.   VSS, skin clean, dry and intact without evidence of skin break down, no evidence of skin tears noted.  IV catheters discontinued intact. Site without signs and symptoms of complications. Dressing and pressure applied.   An after visit summary was printed and given to the patient. Patient received prescriptions.   D/c education completed with patient/family including follow up instructions, medications list, d/c activities limitations if indicated, with other d/c instructions as indicated by MD- patient able to verbalize understanding, all questions fully answered.   Patient instructed to return to ED, call 911, or Call MD for any changes in condition.  Patient escorted via [facility] and D/C home via private auto.

## 2018-03-24 ENCOUNTER — Telehealth: Payer: Self-pay

## 2018-03-24 LAB — URINE CULTURE: Culture: >=100000 — AB

## 2018-03-24 NOTE — Telephone Encounter (Signed)
Transitional Care Management Follow-up Telephone Call    Date discharged? 03/23/2018  How have you been since you were released from the hospital? [place]. Abdomen remains swollen.  Any patient concerns? None at this time.    Items Reviewed:  Medications reviewed: Yes  Allergies reviewed: Yes  Dietary changes reviewed: Yes  Referrals reviewed: Yes   Functional Questionnaire:  Independent - I Dependent - D    Activities of Daily Living (ADLs):    Personal hygiene - I Dressing - I Eating - I Maintaining continence - I Transferring - I   Independent Activities of Daily Living (iADLs): Basic communication skills - I Transportation - I Meal preparation  - I Shopping - I Housework - I Managing medications - I  Managing personal finances - I   Confirmed importance and date/time of follow-up visits scheduled YES  Provider Appointment booked with PCP 04/01/18 @ 1000  Confirmed with patient if condition begins to worsen call PCP or go to the ER.  Patient was given the office number and encouraged to call back with question or concerns: YES

## 2018-03-26 LAB — CULTURE, BLOOD (ROUTINE X 2)
Culture: NO GROWTH
Culture: NO GROWTH
Special Requests: ADEQUATE
Special Requests: ADEQUATE

## 2018-04-01 ENCOUNTER — Ambulatory Visit (INDEPENDENT_AMBULATORY_CARE_PROVIDER_SITE_OTHER): Payer: Medicare HMO | Admitting: Family Medicine

## 2018-04-01 ENCOUNTER — Encounter: Payer: Self-pay | Admitting: Family Medicine

## 2018-04-01 VITALS — BP 100/60 | HR 85 | Temp 97.9°F | Ht 63.0 in | Wt 159.0 lb

## 2018-04-01 DIAGNOSIS — K7469 Other cirrhosis of liver: Secondary | ICD-10-CM

## 2018-04-01 DIAGNOSIS — B1920 Unspecified viral hepatitis C without hepatic coma: Secondary | ICD-10-CM | POA: Diagnosis not present

## 2018-04-01 DIAGNOSIS — B182 Chronic viral hepatitis C: Secondary | ICD-10-CM

## 2018-04-01 DIAGNOSIS — K5909 Other constipation: Secondary | ICD-10-CM | POA: Diagnosis not present

## 2018-04-01 DIAGNOSIS — Z87440 Personal history of urinary (tract) infections: Secondary | ICD-10-CM

## 2018-04-01 DIAGNOSIS — C220 Liver cell carcinoma: Secondary | ICD-10-CM

## 2018-04-01 DIAGNOSIS — R69 Illness, unspecified: Secondary | ICD-10-CM | POA: Diagnosis not present

## 2018-04-01 DIAGNOSIS — N3000 Acute cystitis without hematuria: Secondary | ICD-10-CM

## 2018-04-01 NOTE — Assessment & Plan Note (Signed)
Symptoms resolved S/P cipro course.

## 2018-04-01 NOTE — Assessment & Plan Note (Signed)
Upcoming radiation planned.

## 2018-04-01 NOTE — Assessment & Plan Note (Signed)
Followed by GI

## 2018-04-01 NOTE — Progress Notes (Signed)
Subjective:    Patient ID: [name], female    DOB: 08-06-1955, 63 y.o.   MRN: [id number]  HPI  63 year old female pt with hepatitis C , hepatocellular carcinoma, hx of CVA and  compensated  Alcoholic cirrhosis presents for hospital follow up.  Admitted on 03/21/2018 to [facility]  sent given fever by GI office. DX with UTI as cause of fever.   Hospital Course copied for information purposes below:  Fever-was secondary to a urinary tract infection.  Fever has now resolved.  There was a thought for possible underlying SBP but patient did not have enough ascitic fluid for paracentesis. - Patient was empirically treated with IV ciprofloxacin for UTI and now being discharged on oral Cipro.  She is clinically asymptomatic.  Acute UTI- UA consistent with infection -Treated with IV Cipro and now being discharged on oral ciprofloxacin.  Urine cultures remain negative.  Patient is afebrile and hemodynamically stable.  Ascites with cirrhosis of liver -Ultrasound showing not significant ascites that needed paracentesis.  Seen by gastroenterology and they recommend outpatient follow-up with her GI physician.    Hepatocellular cancer- due to history of chronic hepatitis C - cont. Follow up with Outpatient GI for treatment.    04/01/18 Urine culture returned positive for Klebsiella... pt has completed her course of  Cipro. Blood cultures negative x 2  Neg HIV CBC showed anemia Hg 9.6 NA 127 to 130 on discharge Potassium nml AST 180, 131  ALT 77, 60  Alk phos 323,269 Bili2.1, 2.2  CXR negative  She is feeling some better.. no dysuria ( but did not have any to begin with)  Weakness is better.  She has decreased appetite and felling of fullness in her abdomen.  Per last GI note: Current plan for liver directed therapy with Y 90 with  Mapping planned for January with Dr. Annamaria Boots.  Wt Readings from Last 3 Encounters:  04/01/18 159 lb (72.1 kg)  03/21/18 158 lb 15.2 oz (72.1 kg)  03/21/18  159 lb (72.1 kg)    Social History /Family History/Past Medical History reviewed in detail and updated in EMR if needed. Blood pressure 100/60, pulse 85, temperature 97.9 F (36.6 C), temperature source Oral, height '5\' 3"'  (1.6 m), weight 159 lb (72.1 kg), SpO2 96 %.  Review of Systems  Constitutional: Negative for fatigue and fever.  HENT: Negative for congestion.   Eyes: Negative for pain.  Respiratory: Negative for cough and shortness of breath.   Cardiovascular: Negative for chest pain, palpitations and leg swelling.  Gastrointestinal: Negative for abdominal pain.  Genitourinary: Negative for dysuria and vaginal bleeding.  Musculoskeletal: Negative for back pain.  Neurological: Negative for syncope, light-headedness and headaches.  Psychiatric/Behavioral: Negative for dysphoric mood.       Objective:   Physical Exam Constitutional:      General: She is not in acute distress.    Appearance: Normal appearance. She is well-developed. She is not ill-appearing or toxic-appearing.  HENT:     Head: Normocephalic.     Right Ear: Hearing, tympanic membrane, ear canal and external ear normal. Tympanic membrane is not erythematous, retracted or bulging.     Left Ear: Hearing, tympanic membrane, ear canal and external ear normal. Tympanic membrane is not erythematous, retracted or bulging.     Nose: No mucosal edema or rhinorrhea.     Right Sinus: No maxillary sinus tenderness or frontal sinus tenderness.     Left Sinus: No maxillary sinus tenderness or frontal sinus tenderness.  Mouth/Throat:     Pharynx: Uvula midline.  Eyes:     General: Lids are normal. Lids are everted, no foreign bodies appreciated.     Conjunctiva/sclera: Conjunctivae normal.     Pupils: Pupils are equal, round, and reactive to light.  Neck:     Musculoskeletal: Normal range of motion and neck supple.     Thyroid: No thyroid mass or thyromegaly.     Vascular: No carotid bruit.     Trachea: Trachea normal.    Cardiovascular:     Rate and Rhythm: Normal rate and regular rhythm.     Pulses: Normal pulses.     Heart sounds: Normal heart sounds, S1 normal and S2 normal. No murmur. No friction rub. No gallop.   Pulmonary:     Effort: Pulmonary effort is normal. No tachypnea or respiratory distress.     Breath sounds: Normal breath sounds. No decreased breath sounds, wheezing, rhonchi or rales.  Abdominal:     General: Bowel sounds are normal.     Palpations: Abdomen is soft. There is no shifting dullness or fluid wave.     Tenderness: There is generalized abdominal tenderness. There is no guarding or rebound.     Comments: Distended blaoted abdomen  Skin:    General: Skin is warm and dry.     Findings: No rash.     Comments: Liver disease stigmata.. varicosities on chest and face  Neurological:     Mental Status: She is alert.  Psychiatric:        Mood and Affect: Mood is not anxious or depressed.        Speech: Speech normal.        Behavior: Behavior normal. Behavior is cooperative.        Thought Content: Thought content normal.        Judgment: Judgment normal.           Assessment & Plan:

## 2018-04-01 NOTE — Assessment & Plan Note (Signed)
Fiber, fluids, dulcolax, miralax prn.

## 2018-04-01 NOTE — Patient Instructions (Signed)
Keep up with fluids and fber to help with constipaiton.  Keep appointments as planned with GI.

## 2018-04-01 NOTE — Assessment & Plan Note (Signed)
Stable and clinically compensated in office today.  [place] in hospital showed no ascites.

## 2018-04-04 ENCOUNTER — Other Ambulatory Visit: Payer: Self-pay | Admitting: Radiology

## 2018-04-07 ENCOUNTER — Encounter (HOSPITAL_COMMUNITY)
Admission: RE | Admit: 2018-04-07 | Discharge: 2018-04-07 | Disposition: A | Discharge status: Home / Self Care | Payer: Medicare HMO | Source: Ambulatory Visit | Attending: Interventional Radiology | Admitting: Interventional Radiology

## 2018-04-07 ENCOUNTER — Encounter (HOSPITAL_COMMUNITY): Payer: Self-pay

## 2018-04-07 ENCOUNTER — Ambulatory Visit
Admission: RE | Admit: 2018-04-07 | Discharge: 2018-04-07 | Disposition: A | Discharge status: Home / Self Care | Payer: Medicare HMO | Source: Ambulatory Visit | Attending: Gastroenterology | Admitting: Gastroenterology

## 2018-04-07 ENCOUNTER — Encounter: Payer: Medicare HMO | Admitting: Nurse Practitioner

## 2018-04-07 ENCOUNTER — Ambulatory Visit (HOSPITAL_COMMUNITY)
Admission: RE | Admit: 2018-04-07 | Discharge: 2018-04-07 | Disposition: A | Discharge status: Home / Self Care | Payer: Medicare HMO | Source: Ambulatory Visit | Attending: Interventional Radiology | Admitting: Interventional Radiology

## 2018-04-07 ENCOUNTER — Other Ambulatory Visit: Payer: Self-pay | Admitting: Oncology

## 2018-04-07 ENCOUNTER — Other Ambulatory Visit: Payer: Self-pay

## 2018-04-07 ENCOUNTER — Inpatient Hospital Stay: Payer: Medicare HMO | Admitting: Oncology

## 2018-04-07 ENCOUNTER — Other Ambulatory Visit: Payer: Self-pay | Admitting: Gastroenterology

## 2018-04-07 DIAGNOSIS — K5900 Constipation, unspecified: Secondary | ICD-10-CM | POA: Insufficient documentation

## 2018-04-07 DIAGNOSIS — B182 Chronic viral hepatitis C: Secondary | ICD-10-CM | POA: Diagnosis not present

## 2018-04-07 DIAGNOSIS — K920 Hematemesis: Secondary | ICD-10-CM | POA: Diagnosis not present

## 2018-04-07 DIAGNOSIS — K7469 Other cirrhosis of liver: Secondary | ICD-10-CM | POA: Diagnosis not present

## 2018-04-07 DIAGNOSIS — R69 Illness, unspecified: Secondary | ICD-10-CM | POA: Diagnosis not present

## 2018-04-07 DIAGNOSIS — R140 Abdominal distension (gaseous): Secondary | ICD-10-CM | POA: Diagnosis not present

## 2018-04-07 DIAGNOSIS — Z01812 Encounter for preprocedural laboratory examination: Secondary | ICD-10-CM | POA: Diagnosis present

## 2018-04-07 DIAGNOSIS — R945 Abnormal results of liver function studies: Secondary | ICD-10-CM | POA: Insufficient documentation

## 2018-04-07 DIAGNOSIS — Z538 Procedure and treatment not carried out for other reasons: Secondary | ICD-10-CM | POA: Diagnosis not present

## 2018-04-07 DIAGNOSIS — C220 Liver cell carcinoma: Secondary | ICD-10-CM | POA: Insufficient documentation

## 2018-04-07 DIAGNOSIS — I8511 Secondary esophageal varices with bleeding: Secondary | ICD-10-CM | POA: Diagnosis not present

## 2018-04-07 DIAGNOSIS — K7460 Unspecified cirrhosis of liver: Secondary | ICD-10-CM | POA: Diagnosis not present

## 2018-04-07 DIAGNOSIS — K449 Diaphragmatic hernia without obstruction or gangrene: Secondary | ICD-10-CM | POA: Diagnosis not present

## 2018-04-07 LAB — CBC WITH DIFFERENTIAL/PLATELET
Abs Immature Granulocytes: 0.31 10*3/uL — ABNORMAL HIGH (ref 0.00–0.07)
Basophils Absolute: 0.1 10*3/uL (ref 0.0–0.1)
Basophils Relative: 0 %
Eosinophils Absolute: 0.1 10*3/uL (ref 0.0–0.5)
Eosinophils Relative: 1 %
HCT: 33.7 % — ABNORMAL LOW (ref 36.0–46.0)
Hemoglobin: 10.9 g/dL — ABNORMAL LOW (ref 12.0–15.0)
Immature Granulocytes: 2 %
Lymphocytes Relative: 5 %
Lymphs Abs: 0.8 10*3/uL (ref 0.7–4.0)
MCH: 27.9 pg (ref 26.0–34.0)
MCHC: 32.3 g/dL (ref 30.0–36.0)
MCV: 86.2 fL (ref 80.0–100.0)
Monocytes Absolute: 2.1 10*3/uL — ABNORMAL HIGH (ref 0.1–1.0)
Monocytes Relative: 15 %
Neutro Abs: 11 10*3/uL — ABNORMAL HIGH (ref 1.7–7.7)
Neutrophils Relative %: 77 %
Platelets: 194 10*3/uL (ref 150–400)
RBC: 3.91 MIL/uL (ref 3.87–5.11)
RDW: 18 % — ABNORMAL HIGH (ref 11.5–15.5)
WBC: 14.3 10*3/uL — ABNORMAL HIGH (ref 4.0–10.5)
nRBC: 0 % (ref 0.0–0.2)

## 2018-04-07 LAB — COMPREHENSIVE METABOLIC PANEL
ALK PHOS: 460 U/L — AB (ref 38–126)
ALT: 117 U/L — AB (ref 0–44)
AST: 294 U/L — ABNORMAL HIGH (ref 15–41)
Albumin: 2.8 g/dL — ABNORMAL LOW (ref 3.5–5.0)
Anion gap: 10 (ref 5–15)
BUN: 14 mg/dL (ref 8–23)
CO2: 21 mmol/L — ABNORMAL LOW (ref 22–32)
Calcium: 8.7 mg/dL — ABNORMAL LOW (ref 8.9–10.3)
Chloride: 96 mmol/L — ABNORMAL LOW (ref 98–111)
Creatinine, Ser: 0.83 mg/dL (ref 0.44–1.00)
GFR calc Af Amer: >60 mL/min (ref >60)
GFR calc non Af Amer: >60 mL/min (ref >60)
Glucose, Bld: 123 mg/dL — ABNORMAL HIGH (ref 70–99)
Potassium: 4.1 mmol/L (ref 3.5–5.1)
Sodium: 127 mmol/L — ABNORMAL LOW (ref 135–145)
Total Bilirubin: 5.4 mg/dL — ABNORMAL HIGH (ref 0.3–1.2)
Total Protein: 7.8 g/dL (ref 6.5–8.1)

## 2018-04-07 LAB — PROTIME-INR
INR: 1.19
Prothrombin Time: 15 seconds (ref 11.4–15.2)

## 2018-04-07 MED ORDER — LIDOCAINE HCL 1 % IJ SOLN
INTRAMUSCULAR | Status: AC
  Filled 2018-04-07: qty 20

## 2018-04-07 MED ORDER — ALPRAZOLAM 0.5 MG PO TABS: 0.5000 mg | ORAL_TABLET | Freq: Two times a day (BID) | ORAL | 0 refills | Status: AC | PRN

## 2018-04-07 MED ORDER — SODIUM CHLORIDE 0.9 % IV SOLN
INTRAVENOUS | Status: DC
  Administered 2018-04-07: 09:00:00 via INTRAVENOUS

## 2018-04-07 NOTE — H&P (Addendum)
Referring Physician(s): Finnegan,T  Supervising Physician: Daryll Brod  Patient Status:  [facility] OP  Chief Complaint: Multifocal hepatocellular carcinoma   Subjective: Patient familiar to IR service from consultation with Dr. Annamaria Boots on 02/19/2018 to discuss treatment options for multifocal HCC.  She has a history of hepatitis C and advanced cirrhosis secondary to alcoholism.  Recent imaging has demonstrated enlarging multifocal hepatic lesions compatible with [place] by MRI.  She has lesions in both lobes as well as elevated AFP.  She was deemed an appropriate candidate for hepatic Y 90 radioembolization and presents today for visceral/hepatic arterial roadmapping study with embolization and test Y 90 dosing.  She denies fever, headache, chest pain, worsening dyspnea, back pain, nausea, vomiting or bleeding. She continues to smoke and has occasional coughing as well as some abdominal distention.  Past Medical History:  Diagnosis Date  . Acquired hypothyroidism    thyroid goiter s/p thyroidectomy  . Allergy   . Bipolar 1 disorder ([place]) 1990s  . Chronic hepatitis C (HCC)    genotype 1a  . Compensated HCV cirrhosis ([place])   . Complication of anesthesia    states 15 yrs ago, hard time waking up, states cant breathe when coming out of anesthesia  . Complication of anesthesia    per patient heart stopped  . COPD (chronic obstructive pulmonary disease) ([place])   . Depression    states seen weekly at [facility]  . GERD (gastroesophageal reflux disease)    Hpylori + and treated (2012)  . History of alcohol abuse   . Insomnia   . Migraines   . Pneumonia   . PONV (postoperative nausea and vomiting)    "it all depends on what kind they give me"  . Portal hypertension ([place])   . Portal hypertensive gastropathy ([place]) 2013  . Smoker   . Stroke ([place])   . TIA (transient ischemic attack)   . Varicose veins    Past Surgical History:  Procedure Laterality Date  . APPENDECTOMY    .  DIAGNOSTIC LAPAROSCOPY     15 yrs ago  . ELECTROMAGNETIC NAVIGATION BROCHOSCOPY N/A 08/15/2017   Procedure: ELECTROMAGNETIC NAVIGATION BRONCHOSCOPY;  Surgeon: Flora Lipps, MD;  Location: [facility] ORS;  Service: Cardiopulmonary;  Laterality: N/A;  . ESOPHAGOGASTRODUODENOSCOPY  10/2010   nl esophagus, HH, portal hypertensive gastropathy, erosive gastropathy, Hpylori + (Dr. Sydnee Levans)  . foot surgery Bilateral 2017   Callus removal  . IR RADIOLOGIST EVAL & MGMT  02/19/2018  . left foot surgery  1990s  . LIVER BIOPSY    . THYROIDECTOMY  05/17/2011   Procedure: TOTAL THYROIDECTOMY;  Surgeon: Earnstine Regal, MD, benign goiter per path report      Allergies: Demerol; Penicillins; Other; Propofol; Latex; and Oxycodone hcl  Medications: Prior to Admission medications   Medication Sig Start Date End Date Taking? Authorizing Provider  aspirin EC 81 MG tablet Take 1 tablet by mouth daily.   Yes [provider]  CHANTIX 0.5 MG tablet TAKE 1 TABLET BY MOUTH 2 TIMES DAILY 01/22/18  Yes Bedsole, Amy E, MD  furosemide (LASIX) 20 MG tablet TAKE 2 TABLETS BY MOUTH DAILY 02/05/18  Yes Bedsole, Amy E, MD  ibuprofen (ADVIL,MOTRIN) 200 MG tablet Take 200-400 mg by mouth daily as needed for headache or moderate pain.   Yes [provider]  ondansetron (ZOFRAN) 4 MG tablet Take 1 tablet (4 mg total) by mouth every 8 (eight) hours as needed for nausea or vomiting. 03/23/18  Yes Abel Presto  J, MD  spironolactone (ALDACTONE) 25 MG tablet TAKE 4 TABLETS BY MOUTH DAILY 02/05/18  Yes Bedsole, Amy E, MD  SYNTHROID 88 MCG tablet TAKE 1 TABLET BY MOUTH ONCE DAILY 01/06/18  Yes Bedsole, Amy E, MD  umeclidinium bromide (INCRUSE ELLIPTA) 62.5 MCG/INH AEPB Inhale 1 puff into the lungs daily. 11/20/17  Yes Bedsole, Amy E, MD     Vital Signs: BP (!) 117/59 (BP Location: Left Arm)   Pulse 88   Temp (!) 97.3 F (36.3 C) (Oral)   Resp 18   SpO2 96%   Physical Exam awake, alert. Jaundiced; Chest with  distant breath sounds bilaterally.  Heart with regular rate and rhythm.  Abdomen distended, soft, positive bowel sounds, mild generalized tenderness to palpation.  No lower extremity edema.  Imaging: No results found.  Labs:  CBC: Recent Labs    03/21/18 1407 03/22/18 0326 03/23/18 0421 04/07/18 0837  WBC 11.0* 6.7 6.7 14.3*  HGB 11.4* 9.6* 10.0* 10.9*  HCT 35.6* 30.3* 31.6* 33.7*  PLT 115* 78* 74* 194    COAGS: Recent Labs    07/30/17 1110 08/15/17 1818 02/05/18 1309 04/07/18 0837  INR 1.02 1.12 1.08 1.19  APTT  --  31 32  --     BMP: Recent Labs    03/21/18 1407 03/22/18 0326 03/23/18 0421 04/07/18 0837  NA 132* 130* 130* 127*  K 3.7 3.5 3.4* 4.1  CL 102 102 101 96*  CO2 20* 22 20* 21*  GLUCOSE 141* 99 135* 123*  BUN 13 11 12 14   CALCIUM 8.0* 7.3* 7.3* 8.7*  CREATININE 0.77 0.76 0.79 0.83  GFRNONAA >60 >60 >60 >60  GFRAA >60 >60 >60 >60    LIVER FUNCTION TESTS: Recent Labs    03/21/18 1407 03/22/18 0326 03/23/18 0421 04/07/18 0837  BILITOT 2.1* 2.2* 2.5* 5.4*  AST 180* 131* 198* 294*  ALT 77* 60* 71* 117*  ALKPHOS 323* 269* 277* 460*  PROT 7.6 6.5 6.7 7.8  ALBUMIN 3.0* 2.5* 2.8* 2.8*    Assessment and Plan: Pt with history of hepatitis C and advanced cirrhosis secondary to alcoholism.  Recent imaging has demonstrated enlarging multifocal hepatic lesions compatible with [place] by MRI.  She has lesions in both lobes as well as elevated AFP.  She was deemed an appropriate candidate for hepatic Y 90 radioembolization and presents today for visceral/hepatic arterial roadmapping study with embolization and test Y 90 dosing. LFT's remain elevated and t bili today is 5.4; completed treatment for recent UTI; case d/w Dr. Annamaria Boots and decision made to cancel above procedure due to worsening liver function. Pt to f/u with Dr. Grayland Ormond. Bedside US abd today revealed only minimal ascites (not enough to tap). Pt updated.    Electronically Signed: D. Rowe Robert,  PA-C 04/07/2018, 9:11 AM   I spent a total of 25 minutes at the the patient's bedside AND on the patient's hospital floor or unit, greater than 50% of which was counseling/coordinating care for hepatic/visceral arteriogram with embolization and test Y 90 dosing

## 2018-04-07 NOTE — Discharge Instructions (Signed)
Moderate Conscious Sedation, Adult, Care After These instructions provide you with information about caring for yourself after your procedure. Your health care provider may also give you more specific instructions. Your treatment has been planned according to current medical practices, but problems sometimes occur. Call your health care provider if you have any problems or questions after your procedure. What can I expect after the procedure? After your procedure, it is common:  To feel sleepy for several hours.  To feel clumsy and have poor balance for several hours.  To have poor judgment for several hours.  To vomit if you eat too soon. Follow these instructions at home: For at least 24 hours after the procedure:   Do not: ? Participate in activities where you could fall or become injured. ? Drive. ? Use heavy machinery. ? Drink alcohol. ? Take sleeping pills or medicines that cause drowsiness. ? Make important decisions or sign legal documents. ? Take care of children on your own.  Rest. Eating and drinking  Follow the diet recommended by your health care provider.  If you vomit: ? Drink water, juice, or soup when you can drink without vomiting. ? Make sure you have little or no nausea before eating solid foods. General instructions  Have a responsible adult stay with you until you are awake and alert.  Take over-the-counter and prescription medicines only as told by your health care provider.  If you smoke, do not smoke without supervision.  Keep all follow-up visits as told by your health care provider. This is important. Contact a health care provider if:  You keep feeling nauseous or you keep vomiting.  You feel light-headed.  You develop a rash.  You have a fever. Get help right away if:  You have trouble breathing. This information is not intended to replace advice given to you by your health care provider. Make sure you discuss any questions you have  with your health care provider. Document Released: 12/31/2012 Document Revised: 08/15/2015 Document Reviewed: 07/02/2015 Elsevier Interactive Patient Education  2019 [place] Radioembolization Discharge Instructions  You have been given a radioactive material during your procedure.  While it is safe for you to be discharged home from the hospital, you need to proceed directly home.    Do not use public transportation, including air travel, lasting more than 2 hours for 1 week.  Avoid crowded public places for 1 week.  Adult visitors should try to avoid close contact with you for 1 week.    Children and pregnant females should not visit or have close contact with you for 1 week.  Items that you touch are not radioactive.  Do not sleep in the same bed as your partner for 1 week, and a condom should be used for sexual activity during the first 24 hours.  Your blood may be radioactive and caution should be used if any bleeding occurs during the recovery period.  Body fluids may be radioactive for 24 hours.  Wash your hands after voiding.  Men should sit to urinate.  Dispose of any soiled materials (flush down toilet or place in trash at home) during the first day.  Drink 6 to 8 glasses of fluids per day for 5 days to hydrate yourself.  If you need to see a doctor during the first week, you must let them know that you were treated with yttrium-90 microspheres, and will be slightly radioactive.  They can call Interventional Radiology [phone] with any questions.  Hepatic Artery Radioembolization, Care After This sheet gives you information about how to care for yourself after your procedure. Your health care provider may also give you more specific instructions. If you have problems or questions, contact your health care provider. What can I expect after the procedure? After the procedure, it is common to have:  A slight fever for 1-2 weeks. If your fever gets worse,  tell your health care provider.  Fatigue.  Loss of appetite. This should gradually improve after about 1 week.  Abdominal pain on your right side.  Soreness and tenderness in your groin area where the needle and catheter were placed (puncture site). Follow these instructions at home:  Puncture site care  Follow instructions from your health care provider about how to take care of the puncture site. Make sure you: ? Wash your hands with soap and water before you change your bandage (dressing). If soap and water are not available, use hand sanitizer. ? Change your dressing as told by your health care provider. ? Leave stitches (sutures), skin glue, or adhesive strips in place. These skin closures may need to stay in place for 2 weeks or longer. If adhesive strip edges start to loosen and curl up, you may trim the loose edges. Do not remove adhesive strips completely unless your health care provider tells you to do that.  Check your puncture site every day for signs of infection. Check for: ? More redness, swelling, or pain. ? More fluid or blood. ? Warmth. ? Pus or a bad smell. Activity  Rest and return to your normal activities as told by your health care provider. Ask your health care provider what activities are safe for you.  Do not drive for 24 hours after the procedure if you were given a medicine to help you relax (sedative).  Do not lift anything that is heavier than 10 lb (4.5 kg) until your health care provider says that it is safe. Medicines  Take over-the-counter and prescription medicines only as told by your health care provider.  Do not drive or use heavy machinery while taking prescription pain medicine. Radiation precautions  For up to a week after your procedure, there will be a small amount of radioactivity near your liver. This is not especially dangerous to other people. However, you should follow these precautions for 7 days: ? Do not come in close contact  with people. ? Do not sleep in the same bed as someone else. ? Do not hold children or babies. ? Do not have contact with pregnant women. General instructions  To prevent or treat constipation while you are taking prescription pain medicine, your health care provider may recommend that you: ? Drink enough fluid to keep your urine clear or pale yellow. ? Take over-the-counter or prescription medicines. ? Eat foods that are high in fiber, such as fresh fruits and vegetables, whole grains, and beans. ? Limit foods that are high in fat and processed sugars, such as fried and sweet foods.  Eat frequent small meals until your appetite returns. Follow instructions from your health care provider about eating or drinking restrictions.  Do not take baths, swim, or use a hot tub until your health care provider approves. You may take showers. Wash your puncture site with mild soap and water and pat the area dry.  Wear compression stockings as told by your health care provider. These stockings help to prevent blood clots and reduce swelling in your legs.  Keep all follow-up visits  as told by your health care provider. This is important. You may need to have blood tests and imaging tests done. Contact a health care provider if:  You have more redness, swelling, or pain around your puncture site.  You have more fluid or blood coming from your puncture site.  Your puncture site feels warm to the touch.  You have pus or a bad smell coming from your puncture site.  You have pain that: ? Gets worse. ? Does not get better with medicine. ? Feels like very bad heartburn. ? Is in the middle of your abdomen, above your belly button.  Your skin and the white parts of your eyes turn yellow (jaundice).  The color of your urine changes to dark brown.  The color of your stool changes to light yellow.  Your abdominal measurement (girth) increases in a short period of time.  You gain more than 5 lb (2.3  kg) in a short period of time. Get help right away if:  You have a fever that lasts longer than 2 weeks or is higher than what your health care provider told you to expect.  You develop any of the following in your legs: ? Pain. ? Swelling. ? Skin that is cold or pale or turns blue.  You have chest pain.  You have blood in your vomit, saliva, or stool.  You have trouble breathing. This information is not intended to replace advice given to you by your health care provider. Make sure you discuss any questions you have with your health care provider. Document Released: 03/17/2013 Document Revised: 12/10/2015 Document Reviewed: 12/10/2015 Elsevier Interactive Patient Education  [employer].

## 2018-04-07 NOTE — Progress Notes (Signed)
Patient and sister [name] instructed to go see Dr Grayland Ormond at [facility] upon discharge from [facility].  Patient and [name] verbalized understanding.

## 2018-04-08 ENCOUNTER — Telehealth: Payer: Self-pay

## 2018-04-08 ENCOUNTER — Other Ambulatory Visit: Payer: Self-pay

## 2018-04-08 ENCOUNTER — Other Ambulatory Visit: Payer: Self-pay | Admitting: *Deleted

## 2018-04-08 DIAGNOSIS — C220 Liver cell carcinoma: Secondary | ICD-10-CM

## 2018-04-08 LAB — HEPATIC FUNCTION PANEL
ALT: 115 U/L — ABNORMAL HIGH (ref 0–44)
AST: 295 U/L — ABNORMAL HIGH (ref 15–41)
Albumin: 2.7 g/dL — ABNORMAL LOW (ref 3.5–5.0)
Alkaline Phosphatase: 430 U/L — ABNORMAL HIGH (ref 38–126)
Bilirubin, Direct: 2.4 mg/dL — ABNORMAL HIGH (ref 0.0–0.2)
Indirect Bilirubin: 3.1 mg/dL — ABNORMAL HIGH (ref 0.3–0.9)
Total Bilirubin: 5.5 mg/dL — ABNORMAL HIGH (ref 0.3–1.2)
Total Protein: 7.3 g/dL (ref 6.5–8.1)

## 2018-04-08 NOTE — Telephone Encounter (Signed)
Please schedule for lab/MRI this week if possible, see Woodfin Ganja the next day for results.

## 2018-04-08 NOTE — Patient Outreach (Signed)
[facility]) Care Management  04/08/2018  Channell Quattrone 1955/05/16 [id number]   Telephone Screen  Referral Date: 04/07/2018 Referral Source: [employer] Report Referral Reason:unknown Insurance: [facility]   Outreach attempt # 1 to patient. No answer. RN CM left HIPAA compliant voicemail message along with contact info.    Plan: RN CM will make outreach attempt to patient within 3-4 business days. RN CM will send unsuccessful outreach letter to patient.  Enzo Montgomery, RN,BSN,CCM [place] [facility] Telephonic Care Management Coordinator Direct Phone: [phone] Toll Free: [phone] Fax: [phone]

## 2018-04-08 NOTE — Telephone Encounter (Signed)
Returned call to [employer] answered. Notified that [facility] will be contacting her in the morning and that they are planning MRI and an appointment to see Dr. Grayland Ormond. Sonia Baller will be able give her a full update on Dr. Gary Fleet plan. Oncology Nurse Navigator Documentation  Navigator Location: [facility] (04/08/18 1600)   )Navigator Encounter Type: Telephone (04/08/18 1600) Telephone: Outgoing Call (04/08/18 1600)                                                  Time Spent with Patient: 15 (04/08/18 1600)

## 2018-04-08 NOTE — Telephone Encounter (Signed)
Received call from sister Jocelyn Lamer asking what the next steps are going to be for Ms. Hegstrom after her encounters yesterday. She states she was sent to GI, from our cancer center, after being told that she has worsening [place] and could not have IR treatment administered in [place]. Please advise. [facility] has note available.  Oncology Nurse Navigator Documentation  Navigator Location: [facility] (04/08/18 1300)   )Navigator Encounter Type: Telephone (04/08/18 1300) Telephone: Incoming Call (04/08/18 1300)                                                  Time Spent with Patient: 15 (04/08/18 1300)

## 2018-04-09 ENCOUNTER — Inpatient Hospital Stay: Payer: Medicare HMO

## 2018-04-09 ENCOUNTER — Inpatient Hospital Stay
Admission: EM | Admit: 2018-04-09 | Discharge: 2018-04-26 | DRG: 432 | Disposition: E | Payer: Medicare HMO | Attending: Internal Medicine | Admitting: Internal Medicine

## 2018-04-09 ENCOUNTER — Encounter: Payer: Self-pay | Admitting: Emergency Medicine

## 2018-04-09 ENCOUNTER — Inpatient Hospital Stay: Payer: Medicare HMO | Admitting: Anesthesiology

## 2018-04-09 ENCOUNTER — Encounter: Payer: Self-pay | Admitting: Oncology

## 2018-04-09 ENCOUNTER — Other Ambulatory Visit: Payer: Self-pay

## 2018-04-09 ENCOUNTER — Other Ambulatory Visit: Payer: Self-pay | Admitting: *Deleted

## 2018-04-09 ENCOUNTER — Emergency Department: Payer: Medicare HMO

## 2018-04-09 ENCOUNTER — Telehealth: Payer: Self-pay

## 2018-04-09 ENCOUNTER — Encounter: Admission: EM | Disposition: E | Payer: Self-pay | Source: Home / Self Care | Attending: Internal Medicine

## 2018-04-09 DIAGNOSIS — Z7982 Long term (current) use of aspirin: Secondary | ICD-10-CM

## 2018-04-09 DIAGNOSIS — J9600 Acute respiratory failure, unspecified whether with hypoxia or hypercapnia: Secondary | ICD-10-CM | POA: Diagnosis not present

## 2018-04-09 DIAGNOSIS — E890 Postprocedural hypothyroidism: Secondary | ICD-10-CM | POA: Diagnosis present

## 2018-04-09 DIAGNOSIS — K920 Hematemesis: Secondary | ICD-10-CM | POA: Diagnosis not present

## 2018-04-09 DIAGNOSIS — A419 Sepsis, unspecified organism: Secondary | ICD-10-CM | POA: Diagnosis present

## 2018-04-09 DIAGNOSIS — C220 Liver cell carcinoma: Secondary | ICD-10-CM | POA: Diagnosis not present

## 2018-04-09 DIAGNOSIS — K2210 Ulcer of esophagus without bleeding: Secondary | ICD-10-CM | POA: Diagnosis not present

## 2018-04-09 DIAGNOSIS — N170 Acute kidney failure with tubular necrosis: Secondary | ICD-10-CM | POA: Diagnosis not present

## 2018-04-09 DIAGNOSIS — Z4682 Encounter for fitting and adjustment of non-vascular catheter: Secondary | ICD-10-CM | POA: Diagnosis not present

## 2018-04-09 DIAGNOSIS — F1021 Alcohol dependence, in remission: Secondary | ICD-10-CM | POA: Diagnosis present

## 2018-04-09 DIAGNOSIS — Z888 Allergy status to other drugs, medicaments and biological substances status: Secondary | ICD-10-CM

## 2018-04-09 DIAGNOSIS — I8511 Secondary esophageal varices with bleeding: Secondary | ICD-10-CM | POA: Diagnosis present

## 2018-04-09 DIAGNOSIS — F17221 Nicotine dependence, chewing tobacco, in remission: Secondary | ICD-10-CM | POA: Diagnosis present

## 2018-04-09 DIAGNOSIS — K7460 Unspecified cirrhosis of liver: Secondary | ICD-10-CM

## 2018-04-09 DIAGNOSIS — J9602 Acute respiratory failure with hypercapnia: Secondary | ICD-10-CM | POA: Diagnosis present

## 2018-04-09 DIAGNOSIS — R188 Other ascites: Secondary | ICD-10-CM

## 2018-04-09 DIAGNOSIS — D72829 Elevated white blood cell count, unspecified: Secondary | ICD-10-CM | POA: Diagnosis not present

## 2018-04-09 DIAGNOSIS — Z515 Encounter for palliative care: Secondary | ICD-10-CM

## 2018-04-09 DIAGNOSIS — Z8673 Personal history of transient ischemic attack (TIA), and cerebral infarction without residual deficits: Secondary | ICD-10-CM

## 2018-04-09 DIAGNOSIS — K7040 Alcoholic hepatic failure without coma: Secondary | ICD-10-CM | POA: Diagnosis present

## 2018-04-09 DIAGNOSIS — B182 Chronic viral hepatitis C: Secondary | ICD-10-CM | POA: Diagnosis present

## 2018-04-09 DIAGNOSIS — F17210 Nicotine dependence, cigarettes, uncomplicated: Secondary | ICD-10-CM | POA: Diagnosis not present

## 2018-04-09 DIAGNOSIS — J9601 Acute respiratory failure with hypoxia: Secondary | ICD-10-CM | POA: Diagnosis not present

## 2018-04-09 DIAGNOSIS — Z91040 Latex allergy status: Secondary | ICD-10-CM

## 2018-04-09 DIAGNOSIS — D62 Acute posthemorrhagic anemia: Secondary | ICD-10-CM | POA: Diagnosis present

## 2018-04-09 DIAGNOSIS — R0689 Other abnormalities of breathing: Secondary | ICD-10-CM | POA: Diagnosis not present

## 2018-04-09 DIAGNOSIS — J9811 Atelectasis: Secondary | ICD-10-CM | POA: Diagnosis not present

## 2018-04-09 DIAGNOSIS — J9690 Respiratory failure, unspecified, unspecified whether with hypoxia or hypercapnia: Secondary | ICD-10-CM | POA: Diagnosis not present

## 2018-04-09 DIAGNOSIS — B1920 Unspecified viral hepatitis C without hepatic coma: Secondary | ICD-10-CM | POA: Diagnosis not present

## 2018-04-09 DIAGNOSIS — J449 Chronic obstructive pulmonary disease, unspecified: Secondary | ICD-10-CM | POA: Diagnosis present

## 2018-04-09 DIAGNOSIS — K766 Portal hypertension: Secondary | ICD-10-CM | POA: Diagnosis not present

## 2018-04-09 DIAGNOSIS — K7469 Other cirrhosis of liver: Principal | ICD-10-CM | POA: Diagnosis present

## 2018-04-09 DIAGNOSIS — J189 Pneumonia, unspecified organism: Secondary | ICD-10-CM

## 2018-04-09 DIAGNOSIS — R6521 Severe sepsis with septic shock: Secondary | ICD-10-CM | POA: Diagnosis not present

## 2018-04-09 DIAGNOSIS — J690 Pneumonitis due to inhalation of food and vomit: Secondary | ICD-10-CM | POA: Diagnosis present

## 2018-04-09 DIAGNOSIS — Z66 Do not resuscitate: Secondary | ICD-10-CM | POA: Diagnosis present

## 2018-04-09 DIAGNOSIS — Z8249 Family history of ischemic heart disease and other diseases of the circulatory system: Secondary | ICD-10-CM

## 2018-04-09 DIAGNOSIS — Z880 Allergy status to penicillin: Secondary | ICD-10-CM

## 2018-04-09 DIAGNOSIS — Z79890 Hormone replacement therapy (postmenopausal): Secondary | ICD-10-CM

## 2018-04-09 DIAGNOSIS — R34 Anuria and oliguria: Secondary | ICD-10-CM | POA: Diagnosis present

## 2018-04-09 DIAGNOSIS — Z978 Presence of other specified devices: Secondary | ICD-10-CM

## 2018-04-09 DIAGNOSIS — N289 Disorder of kidney and ureter, unspecified: Secondary | ICD-10-CM | POA: Diagnosis present

## 2018-04-09 DIAGNOSIS — Z833 Family history of diabetes mellitus: Secondary | ICD-10-CM

## 2018-04-09 DIAGNOSIS — Z9911 Dependence on respirator [ventilator] status: Secondary | ICD-10-CM | POA: Diagnosis not present

## 2018-04-09 DIAGNOSIS — K219 Gastro-esophageal reflux disease without esophagitis: Secondary | ICD-10-CM | POA: Diagnosis not present

## 2018-04-09 DIAGNOSIS — E871 Hypo-osmolality and hyponatremia: Secondary | ICD-10-CM | POA: Diagnosis not present

## 2018-04-09 DIAGNOSIS — N390 Urinary tract infection, site not specified: Secondary | ICD-10-CM | POA: Diagnosis present

## 2018-04-09 DIAGNOSIS — I8501 Esophageal varices with bleeding: Secondary | ICD-10-CM | POA: Diagnosis not present

## 2018-04-09 DIAGNOSIS — Z8052 Family history of malignant neoplasm of bladder: Secondary | ICD-10-CM

## 2018-04-09 DIAGNOSIS — Z79899 Other long term (current) drug therapy: Secondary | ICD-10-CM

## 2018-04-09 DIAGNOSIS — E440 Moderate protein-calorie malnutrition: Secondary | ICD-10-CM | POA: Diagnosis present

## 2018-04-09 DIAGNOSIS — I10 Essential (primary) hypertension: Secondary | ICD-10-CM | POA: Diagnosis not present

## 2018-04-09 DIAGNOSIS — Z7189 Other specified counseling: Secondary | ICD-10-CM | POA: Diagnosis not present

## 2018-04-09 DIAGNOSIS — K3189 Other diseases of stomach and duodenum: Secondary | ICD-10-CM | POA: Diagnosis not present

## 2018-04-09 DIAGNOSIS — Z885 Allergy status to narcotic agent status: Secondary | ICD-10-CM

## 2018-04-09 DIAGNOSIS — K922 Gastrointestinal hemorrhage, unspecified: Secondary | ICD-10-CM | POA: Diagnosis not present

## 2018-04-09 DIAGNOSIS — G9341 Metabolic encephalopathy: Secondary | ICD-10-CM | POA: Diagnosis present

## 2018-04-09 DIAGNOSIS — C227 Other specified carcinomas of liver: Secondary | ICD-10-CM | POA: Diagnosis not present

## 2018-04-09 DIAGNOSIS — K449 Diaphragmatic hernia without obstruction or gangrene: Secondary | ICD-10-CM | POA: Diagnosis not present

## 2018-04-09 DIAGNOSIS — Z452 Encounter for adjustment and management of vascular access device: Secondary | ICD-10-CM

## 2018-04-09 DIAGNOSIS — Z9221 Personal history of antineoplastic chemotherapy: Secondary | ICD-10-CM

## 2018-04-09 DIAGNOSIS — R9431 Abnormal electrocardiogram [ECG] [EKG]: Secondary | ICD-10-CM | POA: Diagnosis not present

## 2018-04-09 DIAGNOSIS — E875 Hyperkalemia: Secondary | ICD-10-CM | POA: Diagnosis not present

## 2018-04-09 DIAGNOSIS — E878 Other disorders of electrolyte and fluid balance, not elsewhere classified: Secondary | ICD-10-CM | POA: Diagnosis not present

## 2018-04-09 DIAGNOSIS — R69 Illness, unspecified: Secondary | ICD-10-CM | POA: Diagnosis not present

## 2018-04-09 DIAGNOSIS — E039 Hypothyroidism, unspecified: Secondary | ICD-10-CM | POA: Diagnosis not present

## 2018-04-09 HISTORY — DX: Unspecified viral hepatitis C without hepatic coma: B19.20

## 2018-04-09 HISTORY — PX: ESOPHAGOGASTRODUODENOSCOPY (EGD) WITH PROPOFOL: SHX5813

## 2018-04-09 LAB — CBC WITH DIFFERENTIAL/PLATELET
Abs Immature Granulocytes: 0.79 10*3/uL — ABNORMAL HIGH (ref 0.00–0.07)
Basophils Absolute: 0.1 10*3/uL (ref 0.0–0.1)
Basophils Relative: 0 %
Eosinophils Absolute: 0 10*3/uL (ref 0.0–0.5)
Eosinophils Relative: 0 %
HEMATOCRIT: 32.2 % — AB (ref 36.0–46.0)
HEMOGLOBIN: 10.3 g/dL — AB (ref 12.0–15.0)
Immature Granulocytes: 4 %
LYMPHS ABS: 1.2 10*3/uL (ref 0.7–4.0)
Lymphocytes Relative: 7 %
MCH: 28.6 pg (ref 26.0–34.0)
MCHC: 32 g/dL (ref 30.0–36.0)
MCV: 89.4 fL (ref 80.0–100.0)
Monocytes Absolute: 3 10*3/uL — ABNORMAL HIGH (ref 0.1–1.0)
Monocytes Relative: 16 %
Neutro Abs: 13.7 10*3/uL — ABNORMAL HIGH (ref 1.7–7.7)
Neutrophils Relative %: 73 %
Platelets: 203 10*3/uL (ref 150–400)
RBC: 3.6 MIL/uL — ABNORMAL LOW (ref 3.87–5.11)
RDW: 19.6 % — ABNORMAL HIGH (ref 11.5–15.5)
WBC: 18.8 10*3/uL — ABNORMAL HIGH (ref 4.0–10.5)
nRBC: 0 % (ref 0.0–0.2)

## 2018-04-09 LAB — BLOOD GAS, ARTERIAL
Acid-base deficit: 6 mmol/L — ABNORMAL HIGH (ref 0.0–2.0)
Bicarbonate: 20.7 mmol/L (ref 20.0–28.0)
FIO2: 0.8
MECHVT: 450 mL
Mechanical Rate: 16
O2 Saturation: 99 %
PEEP: 5 cmH2O
Patient temperature: 37
pCO2 arterial: 44 mmHg (ref 32.0–48.0)
pH, Arterial: 7.28 — ABNORMAL LOW (ref 7.350–7.450)
pO2, Arterial: 147 mmHg — ABNORMAL HIGH (ref 83.0–108.0)

## 2018-04-09 LAB — COMPREHENSIVE METABOLIC PANEL
ALT: 150 U/L — ABNORMAL HIGH (ref 0–44)
AST: 495 U/L — ABNORMAL HIGH (ref 15–41)
Albumin: 2.6 g/dL — ABNORMAL LOW (ref 3.5–5.0)
Alkaline Phosphatase: 455 U/L — ABNORMAL HIGH (ref 38–126)
Anion gap: 13 (ref 5–15)
BUN: 26 mg/dL — ABNORMAL HIGH (ref 8–23)
CO2: 20 mmol/L — ABNORMAL LOW (ref 22–32)
CREATININE: 0.81 mg/dL (ref 0.44–1.00)
Calcium: 7.7 mg/dL — ABNORMAL LOW (ref 8.9–10.3)
Chloride: 96 mmol/L — ABNORMAL LOW (ref 98–111)
GFR calc Af Amer: >60 mL/min (ref >60)
GFR calc non Af Amer: >60 mL/min (ref >60)
Glucose, Bld: 97 mg/dL (ref 70–99)
Potassium: 5.5 mmol/L — ABNORMAL HIGH (ref 3.5–5.1)
Sodium: 129 mmol/L — ABNORMAL LOW (ref 135–145)
Total Bilirubin: 9 mg/dL — ABNORMAL HIGH (ref 0.3–1.2)
Total Protein: 7.1 g/dL (ref 6.5–8.1)

## 2018-04-09 LAB — CBC
HCT: 31.6 % — ABNORMAL LOW (ref 36.0–46.0)
Hemoglobin: 10 g/dL — ABNORMAL LOW (ref 12.0–15.0)
MCH: 28.6 pg (ref 26.0–34.0)
MCHC: 31.6 g/dL (ref 30.0–36.0)
MCV: 90.3 fL (ref 80.0–100.0)
Platelets: 275 10*3/uL (ref 150–400)
RBC: 3.5 MIL/uL — ABNORMAL LOW (ref 3.87–5.11)
RDW: 19.9 % — ABNORMAL HIGH (ref 11.5–15.5)
WBC: 30.4 10*3/uL — ABNORMAL HIGH (ref 4.0–10.5)
nRBC: 0.1 % (ref 0.0–0.2)

## 2018-04-09 LAB — BASIC METABOLIC PANEL WITH GFR
Anion gap: 13 (ref 5–15)
BUN: 32 mg/dL — ABNORMAL HIGH (ref 8–23)
CO2: 19 mmol/L — ABNORMAL LOW (ref 22–32)
Calcium: 7.3 mg/dL — ABNORMAL LOW (ref 8.9–10.3)
Chloride: 100 mmol/L (ref 98–111)
Creatinine, Ser: 0.82 mg/dL (ref 0.44–1.00)
GFR calc Af Amer: >60 mL/min
GFR calc non Af Amer: >60 mL/min
Glucose, Bld: 121 mg/dL — ABNORMAL HIGH (ref 70–99)
Potassium: 5.3 mmol/L — ABNORMAL HIGH (ref 3.5–5.1)
Sodium: 132 mmol/L — ABNORMAL LOW (ref 135–145)

## 2018-04-09 LAB — TYPE AND SCREEN
ABO/RH(D): O POS
Antibody Screen: NEGATIVE

## 2018-04-09 LAB — AMMONIA: Ammonia: 60 umol/L — ABNORMAL HIGH (ref 9–35)

## 2018-04-09 LAB — APTT: aPTT: 31 seconds (ref 24–36)

## 2018-04-09 LAB — MRSA PCR SCREENING: MRSA by PCR: NEGATIVE

## 2018-04-09 LAB — PROTIME-INR
INR: 1.44
INR: 1.5
Prothrombin Time: 17.4 seconds — ABNORMAL HIGH (ref 11.4–15.2)
Prothrombin Time: 17.9 seconds — ABNORMAL HIGH (ref 11.4–15.2)

## 2018-04-09 LAB — LACTIC ACID, PLASMA: Lactic Acid, Venous: 6.4 mmol/L (ref 0.5–1.9)

## 2018-04-09 LAB — MAGNESIUM: Magnesium: 2 mg/dL (ref 1.7–2.4)

## 2018-04-09 LAB — PROCALCITONIN: Procalcitonin: 0.51 ng/mL

## 2018-04-09 SURGERY — ESOPHAGOGASTRODUODENOSCOPY (EGD) WITH PROPOFOL
Anesthesia: General

## 2018-04-09 MED ORDER — SODIUM CHLORIDE 0.9 % IV SOLN
0.5000 mg/h | INTRAVENOUS | Status: DC
  Administered 2018-04-10: 2 mg/h via INTRAVENOUS
  Administered 2018-04-10: 3 mg/h via INTRAVENOUS
  Administered 2018-04-10: 4.5 mg/h via INTRAVENOUS
  Administered 2018-04-11: 4 mg/h via INTRAVENOUS
  Administered 2018-04-11: 5 mg/h via INTRAVENOUS
  Filled 2018-04-09 (×6): qty 10

## 2018-04-09 MED ORDER — ORAL CARE MOUTH RINSE
15.0000 mL | OROMUCOSAL | Status: DC
  Administered 2018-04-09 – 2018-04-14 (×44): 15 mL via OROMUCOSAL

## 2018-04-09 MED ORDER — SODIUM CHLORIDE 0.9 % IV BOLUS
500.0000 mL | Freq: Once | INTRAVENOUS | Status: AC
  Administered 2018-04-09: 500 mL via INTRAVENOUS

## 2018-04-09 MED ORDER — OCTREOTIDE LOAD VIA INFUSION
50.0000 ug | Freq: Once | INTRAVENOUS | Status: AC
  Administered 2018-04-09: 50 ug via INTRAVENOUS
  Filled 2018-04-09: qty 25

## 2018-04-09 MED ORDER — MIDAZOLAM HCL 2 MG/2ML IJ SOLN
INTRAMUSCULAR | Status: DC | PRN
  Administered 2018-04-09 (×2): 1 mg via INTRAVENOUS

## 2018-04-09 MED ORDER — NOREPINEPHRINE 16 MG/250ML-% IV SOLN
0.0000 ug/min | INTRAVENOUS | Status: DC
  Administered 2018-04-10: 4 ug/min via INTRAVENOUS
  Administered 2018-04-11: 6 ug/min via INTRAVENOUS
  Filled 2018-04-09 (×2): qty 250

## 2018-04-09 MED ORDER — IPRATROPIUM-ALBUTEROL 0.5-2.5 (3) MG/3ML IN SOLN: 3.0000 mL | RESPIRATORY_TRACT | Status: DC | PRN

## 2018-04-09 MED ORDER — ONDANSETRON HCL 4 MG/2ML IJ SOLN
INTRAMUSCULAR | Status: DC | PRN
  Administered 2018-04-09: 4 mg via INTRAVENOUS

## 2018-04-09 MED ORDER — NOREPINEPHRINE BITARTRATE 1 MG/ML IV SOLN
0.0000 ug/min | INTRAVENOUS | Status: DC
  Administered 2018-04-09: 2 ug/min via INTRAVENOUS
  Filled 2018-04-09: qty 4

## 2018-04-09 MED ORDER — MIDAZOLAM HCL 2 MG/2ML IJ SOLN
INTRAMUSCULAR | Status: AC
  Filled 2018-04-09: qty 2

## 2018-04-09 MED ORDER — SODIUM CHLORIDE 0.9 % IV SOLN
80.0000 mg | Freq: Once | INTRAVENOUS | Status: AC
  Administered 2018-04-09: 80 mg via INTRAVENOUS
  Filled 2018-04-09: qty 80

## 2018-04-09 MED ORDER — PROPOFOL 10 MG/ML IV BOLUS
INTRAVENOUS | Status: AC
  Filled 2018-04-09: qty 20

## 2018-04-09 MED ORDER — SODIUM CHLORIDE 0.9 % IV SOLN
1.0000 g | INTRAVENOUS | Status: DC
  Administered 2018-04-10 – 2018-04-13 (×4): 1 g via INTRAVENOUS
  Filled 2018-04-09: qty 10
  Filled 2018-04-09 (×4): qty 1

## 2018-04-09 MED ORDER — DEXMEDETOMIDINE HCL IN NACL 400 MCG/100ML IV SOLN
0.0000 ug/kg/h | INTRAVENOUS | Status: DC
  Administered 2018-04-09: 0.8 ug/kg/h via INTRAVENOUS
  Administered 2018-04-10: 1 ug/kg/h via INTRAVENOUS
  Filled 2018-04-09 (×2): qty 100

## 2018-04-09 MED ORDER — SODIUM POLYSTYRENE SULFONATE 15 GM/60ML PO SUSP
30.0000 g | Freq: Once | ORAL | Status: AC
  Administered 2018-04-10: 30 g via RECTAL

## 2018-04-09 MED ORDER — ROCURONIUM BROMIDE 100 MG/10ML IV SOLN
INTRAVENOUS | Status: DC | PRN
  Administered 2018-04-09: 30 mg via INTRAVENOUS

## 2018-04-09 MED ORDER — MIDAZOLAM HCL 2 MG/2ML IJ SOLN
2.0000 mg | INTRAMUSCULAR | Status: AC | PRN
  Administered 2018-04-09 – 2018-04-10 (×3): 2 mg via INTRAVENOUS
  Filled 2018-04-09 (×3): qty 2

## 2018-04-09 MED ORDER — ONDANSETRON HCL 4 MG/2ML IJ SOLN
INTRAMUSCULAR | Status: AC
  Filled 2018-04-09: qty 2

## 2018-04-09 MED ORDER — DEXAMETHASONE SODIUM PHOSPHATE 10 MG/ML IJ SOLN
INTRAMUSCULAR | Status: AC
  Filled 2018-04-09: qty 1

## 2018-04-09 MED ORDER — CHLORHEXIDINE GLUCONATE 0.12% ORAL RINSE (MEDLINE KIT)
15.0000 mL | Freq: Two times a day (BID) | OROMUCOSAL | Status: DC
  Administered 2018-04-09 – 2018-04-14 (×10): 15 mL via OROMUCOSAL

## 2018-04-09 MED ORDER — ONDANSETRON HCL 4 MG/2ML IJ SOLN
4.0000 mg | Freq: Once | INTRAMUSCULAR | Status: AC
  Administered 2018-04-09: 4 mg via INTRAVENOUS
  Filled 2018-04-09: qty 2

## 2018-04-09 MED ORDER — SUCCINYLCHOLINE CHLORIDE 20 MG/ML IJ SOLN
INTRAMUSCULAR | Status: DC | PRN
  Administered 2018-04-09: 100 mg via INTRAVENOUS

## 2018-04-09 MED ORDER — SODIUM CHLORIDE 0.9 % IV SOLN
50.0000 ug/h | INTRAVENOUS | Status: DC
  Administered 2018-04-09 – 2018-04-14 (×12): 50 ug/h via INTRAVENOUS
  Filled 2018-04-09 (×23): qty 1

## 2018-04-09 MED ORDER — PROPOFOL 10 MG/ML IV BOLUS
INTRAVENOUS | Status: DC | PRN
  Administered 2018-04-09: 100 mg via INTRAVENOUS

## 2018-04-09 MED ORDER — METOCLOPRAMIDE HCL 5 MG/ML IJ SOLN
10.0000 mg | Freq: Once | INTRAMUSCULAR | Status: AC
  Administered 2018-04-09: 10 mg via INTRAVENOUS
  Filled 2018-04-09: qty 2

## 2018-04-09 MED ORDER — SODIUM CHLORIDE 0.9 % IV SOLN
INTRAVENOUS | Status: DC
  Administered 2018-04-09: 18:00:00 via INTRAVENOUS

## 2018-04-09 MED ORDER — ROCURONIUM BROMIDE 50 MG/5ML IV SOLN
INTRAVENOUS | Status: AC
  Filled 2018-04-09: qty 1

## 2018-04-09 MED ORDER — SODIUM CHLORIDE 0.9 % IV BOLUS: 500.0000 mL | Freq: Once | INTRAVENOUS | Status: DC

## 2018-04-09 MED ORDER — MIDAZOLAM HCL 2 MG/2ML IJ SOLN
2.0000 mg | INTRAMUSCULAR | Status: DC | PRN
  Administered 2018-04-09 – 2018-04-11 (×4): 2 mg via INTRAVENOUS
  Filled 2018-04-09 (×4): qty 2

## 2018-04-09 MED ORDER — LIDOCAINE HCL (CARDIAC) PF 100 MG/5ML IV SOSY
PREFILLED_SYRINGE | INTRAVENOUS | Status: DC | PRN
  Administered 2018-04-09: 100 mg via INTRAVENOUS

## 2018-04-09 MED ORDER — SODIUM CHLORIDE 0.9 % IV SOLN
INTRAVENOUS | Status: DC
  Administered 2018-04-09 – 2018-04-13 (×5): via INTRAVENOUS
  Administered 2018-04-14: 75 mL/h via INTRAVENOUS

## 2018-04-09 MED ORDER — SODIUM CHLORIDE 0.9 % IV SOLN
8.0000 mg/h | INTRAVENOUS | Status: AC
  Administered 2018-04-09 – 2018-04-12 (×6): 8 mg/h via INTRAVENOUS
  Administered 2018-04-12: 6.4 mg/h via INTRAVENOUS
  Filled 2018-04-09 (×7): qty 80

## 2018-04-09 MED ORDER — DEXAMETHASONE SODIUM PHOSPHATE 10 MG/ML IJ SOLN
INTRAMUSCULAR | Status: DC | PRN
  Administered 2018-04-09: 10 mg via INTRAVENOUS

## 2018-04-09 MED ORDER — PANTOPRAZOLE SODIUM 40 MG IV SOLR
40.0000 mg | Freq: Two times a day (BID) | INTRAVENOUS | Status: DC
  Administered 2018-04-13: 40 mg via INTRAVENOUS
  Filled 2018-04-09: qty 40

## 2018-04-09 MED ORDER — EPINEPHRINE PF 1 MG/10ML IJ SOSY
PREFILLED_SYRINGE | INTRAMUSCULAR | Status: AC
  Filled 2018-04-09: qty 10

## 2018-04-09 MED ORDER — SODIUM CHLORIDE 0.9 % IV SOLN
1.0000 g | Freq: Once | INTRAVENOUS | Status: AC
  Administered 2018-04-09: 1 g via INTRAVENOUS
  Filled 2018-04-09: qty 10
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
## 2018-04-09 NOTE — Anesthesia Procedure Notes (Signed)
Procedure Name: Intubation Date/Time: 04/19/2018 6:19 PM Performed by: Lendon Colonel, CRNA Pre-anesthesia Checklist: Patient identified, Patient being monitored, Timeout performed, Emergency Drugs available and Suction available Patient Re-evaluated:Patient Re-evaluated prior to induction Oxygen Delivery Method: Circle system utilized Preoxygenation: Pre-oxygenation with 100% oxygen Induction Type: IV induction, Rapid sequence and Cricoid Pressure applied Laryngoscope Size: Miller and 2 Grade View: Grade I Tube type: Oral Tube size: 7.0 mm Number of attempts: 1 Airway Equipment and Method: Stylet Placement Confirmation: ETT inserted through vocal cords under direct vision,  positive ETCO2 and breath sounds checked- equal and bilateral Secured at: 21 cm Tube secured with: Tape Dental Injury: Teeth and Oropharynx as per pre-operative assessment

## 2018-04-09 NOTE — Progress Notes (Signed)
Family Meeting Note  Advance Directive:yes  Today a meeting took place with the [name], 2 sisters at bedside     The following clinical team members were present during this meeting:MD  The following were discussed:[name]'s diagnosis: Hematemesis with history of liver cirrhosis, portal hypertension, esophageal varices, hepatitis C, stage IV liver cancer, hyperkalemia, hyponatremia, AKI,, treatment plan of care discussed in detail with the [name] and her 2  sisters at bedside.  They all verbalized understanding of the plan   [name]'s progosis: Unable to determine and Goals for treatment: Full Code  Additional follow-up to be provided: Hospitalist, gastroenterology, oncology  Time spent during discussion:77min  Nicholes Mango, MD

## 2018-04-09 NOTE — Interval H&P Note (Signed)
History and Physical Interval Note:  04/12/2018 5:53 PM  [name]  has presented today for surgery, with the diagnosis of hemetemesis  The various methods of treatment have been discussed with the patient and family. After consideration of risks, benefits and other options for treatment, the patient has consented to  Procedure(s): ESOPHAGOGASTRODUODENOSCOPY (EGD) WITH PROPOFOL (N/A) as a surgical intervention .  The patient's history has been reviewed, patient examined, no change in status, stable for surgery.  I have reviewed the patient's chart and labs.  Questions were answered to the patient's satisfaction.     [place], [place]

## 2018-04-09 NOTE — ED Triage Notes (Signed)
Pt in via POV, reports vomiting dark red blood, states four episodes today.  Denies hx of same, denies blood thinners, denies alcohol use.  Pt tachypneic, appears short of breath.  Pt is a cancer patient, currently being treated for liver cancer.  Pt abdomen very taut and distended.  Pt roomed at this time.
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
## 2018-04-09 NOTE — Anesthesia Preprocedure Evaluation (Signed)
Anesthesia Evaluation  Patient identified by MRN, date of birth, ID band Patient awake    Reviewed: Allergy & Precautions, H&P , NPO status , Patient's Chart, lab work & pertinent test results, reviewed documented beta blocker date and time   History of Anesthesia Complications (+) PONV and history of anesthetic complications  Airway Mallampati: II  TM Distance: >3 FB Neck ROM: full    Dental  (+) Edentulous Upper, Edentulous Lower, Dental Advidsory Given   Pulmonary neg shortness of breath, COPD,  COPD inhaler, neg recent URI, Current Smoker,           Cardiovascular Exercise Tolerance: Good (-) hypertension(-) angina(-) CAD, (-) Past MI, (-) Cardiac Stents and (-) CABG (-) dysrhythmias + Valvular Problems/Murmurs      Neuro/Psych neg Seizures PSYCHIATRIC DISORDERS Depression Bipolar Disorder TIA   GI/Hepatic GERD  ,(+)     substance abuse (former alcoholic)  , Hepatitis - (s/p treatment), C  Endo/Other  neg diabetesHypothyroidism   Renal/GU negative Renal ROS  negative genitourinary   Musculoskeletal   Abdominal   Peds  Hematology negative hematology ROS (+)   Anesthesia Other Findings Past Medical History: No date: Acquired hypothyroidism     Comment:  thyroid goiter s/p thyroidectomy 1990s: Bipolar 1 disorder (HCC) No date: Chronic hepatitis C (HCC)     Comment:  genotype 1a No date: Compensated HCV cirrhosis ([place]) No date: Complication of anesthesia     Comment:  states 15 yrs ago, hard time waking up, states cant               breathe when coming out of anesthesia No date: Complication of anesthesia     Comment:  per patient heart stopped No date: COPD (chronic obstructive pulmonary disease) (HCC) No date: Depression     Comment:  states seen weekly at [facility] No date: GERD (gastroesophageal reflux disease)     Comment:  Hpylori + and treated (2012) No date: History of alcohol abuse No  date: Insomnia No date: Migraines No date: Pneumonia No date: PONV (postoperative nausea and vomiting)     Comment:  "it all depends on what kind they give me" No date: Portal hypertension ([place]) 2013: Portal hypertensive gastropathy ([place]) No date: Smoker No date: TIA (transient ischemic attack) No date: Varicose veins   Reproductive/Obstetrics negative OB ROS                             Anesthesia Physical  Anesthesia Plan  ASA: III and emergent  Anesthesia Plan: General   Post-op Pain Management:    Induction: Intravenous  PONV Risk Score and Plan: 3 and Ondansetron and Dexamethasone  Airway Management Planned: Oral ETT  Additional Equipment:   Intra-op Plan:   Post-operative Plan: Post-operative intubation/ventilation  Informed Consent: I have reviewed the patients History and Physical, chart, labs and discussed the procedure including the risks, benefits and alternatives for the proposed anesthesia with the patient or authorized representative who has indicated his/her understanding and acceptance.     Dental Advisory Given  Plan Discussed with: Anesthesiologist, CRNA and Surgeon  Anesthesia Plan Comments:         Anesthesia Quick Evaluation

## 2018-04-09 NOTE — Procedures (Signed)
Central Venous Catheter Insertion Procedure Note Laberta Wilbon [id number] 07-16-1955  Procedure: Insertion of Central Venous Catheter Indications: Assessment of intravascular volume, Drug and/or fluid administration and Frequent blood sampling  Procedure Details Consent: Risks of procedure as well as the alternatives and risks of each were explained to the (patient/caregiver).  Consent for procedure obtained. Time Out: Verified patient identification, verified procedure, site/side was marked, verified correct patient position, special equipment/implants available, medications/allergies/relevent history reviewed, required imaging and test results available.  Performed  Maximum sterile technique was used including antiseptics, cap, gloves, gown, hand hygiene, mask and sheet. Skin prep: Chlorhexidine; local anesthetic administered A antimicrobial bonded/coated triple lumen catheter was placed in the right internal jugular vein using the Seldinger technique.  Evaluation Blood flow good Complications: No apparent complications Patient did tolerate procedure well. Chest X-ray ordered to verify placement.  CXR: pending.  Procedure was performed using ultrasound for real time visualization of vessel cannulization.  Darel Hong, AGACNP-BC [facility] Pager: [phone] Cell: [place] 04/01/2018, 11:44 PM

## 2018-04-09 NOTE — Transfer of Care (Signed)
Immediate Anesthesia Transfer of Care Note  Patient: [name]  Procedure(s) Performed: ESOPHAGOGASTRODUODENOSCOPY (EGD) WITH PROPOFOL (N/A )  Patient Location: PACU and ICU  Anesthesia Type:General  Level of Consciousness: Patient remains intubated per anesthesia plan  Airway & Oxygen Therapy: Patient remains intubated per anesthesia plan  Post-op Assessment: Report given to RN and Post -op Vital signs reviewed and stable  Post vital signs: Reviewed and stable  Last Vitals:  Vitals Value Taken Time  BP 145/54 04/07/2018  7:17 PM  Temp    Pulse 84 04/17/2018  7:20 PM  Resp 16 04/19/2018  7:20 PM  SpO2 99 % 04/15/2018  7:20 PM  Vitals shown include unvalidated device data.  Last Pain:  Vitals:   04/04/2018 1754  TempSrc: Tympanic  PainSc:          Complications: No apparent anesthesia complications
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
## 2018-04-09 NOTE — ED Triage Notes (Signed)
FIRST NURSE NOTE-pt vomiting blood. Pulled into triage 1 for next triage.

## 2018-04-09 NOTE — H&P (Addendum)
[facility] NAME: [name]    MR#:  [id number]  DATE OF BIRTH:  [birth year]  DATE OF ADMISSION:  04/20/2018  PRIMARY CARE PHYSICIAN: Jinny Sanders, MD   REQUESTING/REFERRING PHYSICIAN: Rudene Re, MD  CHIEF COMPLAINT:  Hematemesis  HISTORY OF PRESENT ILLNESS:  [name]  is a 63 y.o. female with a known history of hepatitis C,  liver cirrhosis with esophageal varices, stage IV hepatocellular cancer, could not tolerate chemotherapy is presenting to the ED with a chief complaint of hematemesis  which was started from last night.  Family members has noticed coffee-ground emesis.  Hemoglobin is at 10.3.  Patient goes to [facility] gastroenterology and seen by gastroenterology PA.  She denies any abdominal pain or diarrhea.  No other complaints.  Denies any dizziness.  2 sisters at bedside.  Dr. Alice Reichert on-call gastroenterologist called and notified   PAST MEDICAL HISTORY:   Past Medical History:  Diagnosis Date  . Acquired hypothyroidism    thyroid goiter s/p thyroidectomy  . Allergy   . Bipolar 1 disorder ([place]) 1990s  . Chronic hepatitis C (HCC)    genotype 1a  . Compensated HCV cirrhosis ([place])   . Complication of anesthesia    states 15 yrs ago, hard time waking up, states cant breathe when coming out of anesthesia  . Complication of anesthesia    per patient heart stopped  . COPD (chronic obstructive pulmonary disease) ([place])   . Depression    states seen weekly at [facility]  . GERD (gastroesophageal reflux disease)    Hpylori + and treated (2012)  . History of alcohol abuse   . Insomnia   . Migraines   . Pneumonia   . PONV (postoperative nausea and vomiting)    "it all depends on what kind they give me"  . Portal hypertension ([place])   . Portal hypertensive gastropathy ([place]) 2013  . Smoker   . Stroke ([place])   . TIA (transient ischemic attack)   . Varicose veins     PAST SURGICAL HISTOIRY:    Past Surgical History:  Procedure Laterality Date  . APPENDECTOMY    . DIAGNOSTIC LAPAROSCOPY     15 yrs ago  . ELECTROMAGNETIC NAVIGATION BROCHOSCOPY N/A 08/15/2017   Procedure: ELECTROMAGNETIC NAVIGATION BRONCHOSCOPY;  Surgeon: Flora Lipps, MD;  Location: [facility] ORS;  Service: Cardiopulmonary;  Laterality: N/A;  . ESOPHAGOGASTRODUODENOSCOPY  10/2010   nl esophagus, HH, portal hypertensive gastropathy, erosive gastropathy, Hpylori + (Dr. Sydnee Levans)  . foot surgery Bilateral 2017   Callus removal  . IR RADIOLOGIST EVAL & MGMT  02/19/2018  . left foot surgery  1990s  . LIVER BIOPSY    . THYROIDECTOMY  05/17/2011   Procedure: TOTAL THYROIDECTOMY;  Surgeon: Earnstine Regal, MD, benign goiter per path report    SOCIAL HISTORY:   Social History   Tobacco Use  . Smoking status: Current Some Day Smoker    Packs/day: 0.25    Years: 40.00    Pack years: 10.00    Types: Cigarettes  . Smokeless tobacco: Former Systems developer    Types: Chew  . Tobacco comment: decreasing use 05/30/2011, taking chantix   Substance Use Topics  . Alcohol use: No    FAMILY HISTORY:   Family History  Problem Relation Age of Onset  . Heart disease Mother        pacemaker  . Cancer Father        bladder/melanoma  .  Diabetes Father   . Hypertension Father   . Melanoma Sister   . Breast cancer Neg Hx     DRUG ALLERGIES:   Allergies  Allergen Reactions  . Demerol Anaphylaxis  . Penicillins Anaphylaxis, Hives and Itching    Has patient had a PCN reaction causing immediate rash, facial/tongue/throat swelling, SOB or lightheadedness with hypotension: Yes Has patient had a PCN reaction causing severe rash involving mucus membranes or skin necrosis: Yes Has patient had a PCN reaction that required hospitalization: Yes Has patient had a PCN reaction occurring within the last 10 years: No If all of the above answers are "NO", then may proceed with Cephalosporin use.   . Other Other (See Comments)    general  anesthesia - heart stopped   . Propofol Other (See Comments)    Allergic, caused her to code   . Latex Rash  . Oxycodone Hcl Rash    REVIEW OF SYSTEMS:  CONSTITUTIONAL: No fever, fatigue or weakness.  EYES: No blurred or double vision.  EARS, NOSE, AND THROAT: No tinnitus or ear pain.  RESPIRATORY: No cough, shortness of breath, wheezing or hemoptysis.  CARDIOVASCULAR: No chest pain, orthopnea, edema.  GASTROINTESTINAL: Reporting nausea, hematemesis but denies any abdominal pain or diarrhea  GENITOURINARY: No dysuria, hematuria.  ENDOCRINE: No polyuria, nocturia,  HEMATOLOGY: No anemia, easy bruising or bleeding SKIN: No rash or lesion. MUSCULOSKELETAL: No joint pain or arthritis.   NEUROLOGIC: No tingling, numbness, weakness.  PSYCHIATRY: No anxiety or depression.   MEDICATIONS AT HOME:   Prior to Admission medications   Medication Sig Start Date End Date Taking? Authorizing Provider  ALPRAZolam Duanne Moron) 0.5 MG tablet Take 1 tablet (0.5 mg total) by mouth 2 (two) times daily as needed for anxiety. 04/07/18  Yes Jacquelin Hawking, NP  aspirin EC 81 MG tablet Take 1 tablet by mouth daily.   Yes [provider]  CHANTIX 0.5 MG tablet TAKE 1 TABLET BY MOUTH 2 TIMES DAILY 01/22/18  Yes Bedsole, Amy E, MD  furosemide (LASIX) 20 MG tablet TAKE 2 TABLETS BY MOUTH DAILY 02/05/18  Yes Bedsole, Amy E, MD  ibuprofen (ADVIL,MOTRIN) 200 MG tablet Take 200-400 mg by mouth daily as needed for headache or moderate pain.   Yes [provider]  ondansetron (ZOFRAN) 4 MG tablet Take 1 tablet (4 mg total) by mouth every 8 (eight) hours as needed for nausea or vomiting. 03/23/18  Yes Henreitta Leber, MD  Polyethylene Glycol 3350 (PEG 3350) POWD Take 17 g by mouth daily. 04/07/18  Yes [provider]  spironolactone (ALDACTONE) 25 MG tablet TAKE 4 TABLETS BY MOUTH DAILY 02/05/18  Yes Bedsole, Amy E, MD  SYNTHROID 88 MCG tablet TAKE 1 TABLET BY MOUTH ONCE DAILY 01/06/18  Yes  Bedsole, Amy E, MD  umeclidinium bromide (INCRUSE ELLIPTA) 62.5 MCG/INH AEPB Inhale 1 puff into the lungs daily. 11/20/17  Yes Bedsole, Amy E, MD      VITAL SIGNS:  Blood pressure (!) 122/53, pulse 97, temperature 97.7 F (36.5 C), temperature source Oral, resp. rate (!) 28, height 5\' 3"  (1.6 m), weight 71.7 kg, SpO2 98 %.  PHYSICAL EXAMINATION:  GENERAL:  63 y.o.-year-old patient lying in the bed with no acute distress.  EYES: Pupils equal, round, reactive to light and accommodation. No scleral icterus. Extraocular muscles intact.  HEENT: Head atraumatic, normocephalic. Oropharynx and nasopharynx clear.  NECK:  Supple, no jugular venous distention. No thyroid enlargement, no tenderness.  LUNGS: Normal breath sounds bilaterally, no  wheezing, rales,rhonchi or crepitation. No use of accessory muscles of respiration.  CARDIOVASCULAR: S1, S2 normal. No murmurs, rubs, or gallops.  ABDOMEN: Soft, nontender, distended. Bowel sounds present. EXTREMITIES: No pedal edema, cyanosis, or clubbing.  NEUROLOGIC: Awake, alert and oriented x3 sensation intact. Gait not checked.  PSYCHIATRIC: The patient is alert and oriented x 3.  SKIN: No obvious rash, lesion, or ulcer.   LABORATORY PANEL:   CBC Recent Labs  Lab 04/01/2018 1415  WBC 18.8*  HGB 10.3*  HCT 32.2*  PLT 203   ------------------------------------------------------------------------------------------------------------------  Chemistries  Recent Labs  Lab 04/20/2018 1415  NA 129*  K 5.5*  CL 96*  CO2 20*  GLUCOSE 97  BUN 26*  CREATININE 0.81  CALCIUM 7.7*  AST 495*  ALT 150*  ALKPHOS 455*  BILITOT 9.0*   ------------------------------------------------------------------------------------------------------------------  Cardiac Enzymes No results for input(s): TROPONINI in the last 168 hours. ------------------------------------------------------------------------------------------------------------------  RADIOLOGY:  Ct  Abdomen Pelvis Wo Contrast  Result Date: 04/15/2018 CLINICAL DATA:  reports vomiting dark red blood, states four episodes today. Denies hx of same, denies blood thinners, denies alcohol use. Pt tachypneic, appears short of breath. Pt is a cancer patient, currently being treated for liver cancer. Pt abdomen very taut and distended. EXAM: CT ABDOMEN AND PELVIS WITHOUT CONTRAST TECHNIQUE: Multidetector CT imaging of the abdomen and pelvis was performed following the standard protocol without IV contrast. COMPARISON:  PET-CT, 01/31/2018 FINDINGS: Lower chest: No acute findings. Hepatobiliary: Liver shows extensive nodularity. There is enlargement of the inferior aspect of the right lobe and lateral segment of the left lobe. Subtle hypoattenuating areas are noted in the liver, poorly defined on this unenhanced study, consistent with the known multifocal hepatocellular carcinoma. Gallbladder is unremarkable.  No bile duct dilation. Pancreas: Unremarkable. No pancreatic ductal dilatation or surrounding inflammatory changes. Spleen: Spleen borderline enlarged measuring 12.3 cm in greatest dimension, stable from the prior PET-CT. No splenic mass or focal lesion. Adrenals/Urinary Tract: No adrenal masses. Kidneys normal size, orientation and position. No renal masses or stones. No hydronephrosis. Ureters normal in course and in caliber. Bladder is unremarkable. Stomach/Bowel: Small hiatal hernia. Stomach is otherwise unremarkable. Small bowel is normal in caliber with no wall thickening or adjacent inflammation. Colon is normal in caliber. No wall thickening or convincing inflammation. Vascular/Lymphatic: There are numerous venous collaterals in the upper abdomen, with prominent paraesophageal varices. These are stable from the prior PET-CT. There are dense atherosclerotic calcifications along a normal caliber abdominal aorta. There are multiple prominent and enlarged upper abdominal lymph nodes along the peri celiac and  gastrohepatic ligaments, stable from the prior PET-CT. Reproductive: Uterus and bilateral adnexa are unremarkable. Other: There is now a small amount of ascites collecting adjacent to the liver, and tracking into the pelvis collecting in the pelvic recesses. Musculoskeletal: No fracture or acute finding. No osteoblastic or osteolytic lesions. IMPRESSION: 1. No acute findings within the abdomen or pelvis. 2. Advanced cirrhosis with portal venous hypertension. The multifocal hepatocellular carcinoma noted on the prior PET-CT is not well-defined on this unenhanced study, but shows no convincing change. 3. Prominent and enlarged upper abdominal lymph nodes, stable from the prior PET-CT. 4. Since the prior PET-CT, a small amount of ascites has developed. Electronically Signed   By: Lajean Manes M.D.   On: 04/15/2018 15:33   Dg Abd 2 Views  Result Date: 04/08/2018 CLINICAL DATA:  63 year old with hepatic cirrhosis and hepatocellular carcinoma due to chronic hepatitis C presenting with constipation for approximately 3 weeks. EXAM: ABDOMEN -  2 VIEW COMPARISON:  03/21/2018 and earlier, including PET-CT 01/31/2018. FINDINGS: Bowel gas pattern unremarkable without evidence of obstruction or significant ileus. No evidence of air-fluid levels or free intraperitoneal air on the ERECT image. Expected stool burden in the colon. No visible urinary tract calculi. Splenomegaly, as the inferior tip of the spleen extends nearly to the pelvic brim. Degenerative changes involving the lumbar spine. IMPRESSION: 1. No acute abdominal abnormality. 2. Splenomegaly, as the inferior tip of the spleen extends nearly to the pelvic brim. Electronically Signed   By: Evangeline Dakin M.D.   On: 04/08/2018 09:00    EKG:   Orders placed or performed during the hospital encounter of 04/05/2018  . ED EKG  . ED EKG    IMPRESSION AND PLAN:    #Hematemesis with a history of esophageal varices Admit to MedSurg unit N.p.o., IV  fluids Patient is started on PPI and octreotide drip GI Dr. Alice Reichert called and notified he is aware of the urgent consult Hemoglobin is at 10.3 at this point we will continue close monitoring of the hemoglobin and hematocrit and transfuse as needed IV Rocephin was given for prophylaxis we will continue the same  #Acute kidney injury on chronic kidney disease Hydrate with IV fluids avoid nephrotoxins and monitor renal function closely  #Hyperkalemia Patient being n.p.o. and with hematemesis will give her Kayexalate PR Monitor potassium  #History of hepatocellular carcinoma stage IV Patient could not tolerate chemotherapy Sees Dr. Grayland Ormond and scheduled to get MRI of the liver tomorrow Consult placed to Dr. Grayland Ormond Dr. Grayland Ormond is recommending palliative care consult will add it  #Hyponatremia from SIADH probably Sodium at 129 and patient is clinically stable will continue close monitoring Receiving IV fluids check a.m. labs  #History of liver cirrhosis from hepatitis C with portal hypertension Following [facility] gastroenterology, consult placed to them  GI prophylaxis with PPI  DVT prophylaxis with SCDs  All the records are reviewed and case discussed with ED provider. Management plans discussed with the patient, 2 sisters at bedside and they are in agreement.  CODE STATUS: fc   TOTAL  CRITICAL CARE TIME TAKING CARE OF THIS PATIENT: 43  minutes.   Note: This dictation was prepared with Dragon dictation along with smaller phrase technology. Any transcriptional errors that result from this process are unintentional.  Nicholes Mango M.D on 04/08/2018 at 4:34 PM  Between 7am to 6pm - Pager - [phone]  After 6pm go to [email or web address] - password [email or web address] [facility]  Office  [phone]  CC: Primary care physician; Jinny Sanders, MD

## 2018-04-09 NOTE — Telephone Encounter (Signed)
[name] (no DPR signed) said that pt started coughing up bright red blood last night and last coughed up blood this morning. [name] has left messages at oncology to call her back but no reply yet. Pt has CA of liver and has found a spot on her lung. Pt has abd pain or pressure, abd very distended; pt appears to be 8 mths pregnant. Pt has not coughed up blood before and [name] does not know qty of blood. Pt needs to go to ED for eval. [name] spoke with pt and she refuses to go to ED and just wants to go to sleep. I called pt and spoke with her, pt is very upset. Pt wants to be left alone. I advised pt with all of her dx and new symptom of coughing up blood that she does need to be seen. Pt said to tell [name] that pt is going to get a shower and then will go wherever she says. I spoke with [name] and advised what pt said and [name] said that she is presently on the phone with oncology and she thinks oncology is going to see pt. [name] appreciates our help and ended call. FYI to Dr Diona Browner.

## 2018-04-09 NOTE — Patient Outreach (Signed)
[facility]) Care Management  04/05/2018  Raven Furnas 1955-07-24 [id number]   Telephone Screen  Referral Date: 04/07/2018 Referral Source: [employer] Report Referral Reason:unknown Insurance: [facility]   Outreach attempt #2 to patient. No answer at present.      Plan: RN CM will make outreach attempt to patient within 3-4 business days.   Enzo Montgomery, RN,BSN,CCM [facility] Telephonic Care Management Coordinator Direct Phone: [phone] Toll Free: [phone] Fax: [phone]

## 2018-04-09 NOTE — Telephone Encounter (Signed)
Noted. Agree with pt needing to be seen.

## 2018-04-09 NOTE — Progress Notes (Signed)
Spoke to [employer] several times over the past 3 days regarding [name].  Patient was seen on Monday for possible Y 90 radiation treatment for hepatocellular carcinoma.  Previous tx include sorafenib oral chemotherapy but was discontinued after 3 days d/t stroke like symptoms.  Case was discussed in tumor board and Y 90 radiation treatment with interventional radiology was suggested.  She met with Dr. Loman Brooklyn on Monday for first Y 90 treatment but labs unfortunately revealed an elevated bilirubin and increasing LFTs.  Bedside ultrasound revealed no ascites.  She was instructed to be seen in oncology d/t abnormal labs but once she arrived, she refused to be seen by our symptom management nurse practitioner.  Dr. Grayland Ormond, primary oncologist consulted regarding labs and thought she was appropriate to be seen in GI given history of cirrhosis/hep C. Nurse practitioner arranged for her to be seen at [facility] clinic GI by Laurine Blazer, PA and Dr. Gustavo Lah.  Completed abdominal x-ray revealing constipation.  She was started on a bowel regimen including MiraLAX and Zofran.  Elevated LFTs were thought to be due to progressing hepatocellular carcinoma rather than existing cirrhosis.  MRI of the liver scheduled for 04/10/2018.  Jeraldine Loots called clinic this morning stating Ms. Robertshaw was extremely nauseated with uncontrollable nausea refractory to zofran.  Initially, patient refuses to be seen in clinic and or emergency room.  She would prefer to be [facility] at [facility] or [facility] emergency room.   Explained to Ms. Hunt that Ms. Vogan needed to be evaluated ASAP.  She understood my concerns.   Faythe Casa, NP 04/11/2018 2:14 PM

## 2018-04-09 NOTE — Op Note (Signed)
[facility] Gastroenterology Patient Name: [name] Procedure Date: 03/31/2018 6:15 PM MRN: [id number] Account #: [id number] Date of Birth: 1955/07/05 Admit Type: Inpatient Age: 62 Room: [facility] ENDO ROOM 4 Gender: Female Note Status: Finalized Procedure:            Upper GI endoscopy Indications:          Active gastrointestinal bleeding, Hematemesis,                        Cirrhosis with suspected esophageal varices Providers:            Lorie Apley K. Alice Reichert MD, MD Referring MD:         Lindaann Slough, MD (Referring MD) Medicines:            General Anesthesia Complications:        No immediate complications. Procedure:            Pre-Anesthesia Assessment:                       - The risks and benefits of the procedure and the                        sedation options and risks were discussed with the                        patient. All questions were answered and informed                        consent was obtained.                       - Patient identification and proposed procedure were                        verified prior to the procedure by the nurse. The                        procedure was verified in the procedure room.                       - ASA Grade Assessment: IV - A patient with severe                        systemic disease that is a constant threat to life.                       - After reviewing the risks and benefits, the patient                        was deemed in satisfactory condition to undergo the                        procedure.                       After obtaining informed consent, the endoscope was                        passed under direct vision. Throughout the procedure,  the patient's blood pressure, pulse, and oxygen                        saturations were monitored continuously. The Endoscope                        was introduced through the mouth, and advanced to the                        third part of  duodenum. The upper GI endoscopy was                        somewhat difficult due to excessive bleeding.                        Successful completion of the procedure was aided by                        lavage. The patient tolerated the procedure well. Findings:      Grade I varices were found in the lower third of the esophagus. Active       bleeding was noted in the distal esophagus from one to two variceal       columns. Three bands were successfully placed with complete eradication,       resulting in deflation of varices. There was no bleeding at the end of       the procedure.      Moderate portal hypertensive gastropathy was found in the entire       examined stomach.      The examined duodenum was normal.      The exam was otherwise without abnormality. Impression:           - Grade I esophageal varices. Completely eradicated.                        Banded.                       - Portal hypertensive gastropathy.                       - Normal examined duodenum.                       - The examination was otherwise normal.                       - No specimens collected. Recommendation:       - Continue present medications.                       - Return patient to ICU for ongoing care.                       - NPO.                       - The findings and recommendations were discussed with                        the patient's family. Procedure Code(s):    --- Professional ---  43244, Esophagogastroduodenoscopy, flexible, transoral;                        with band ligation of esophageal/gastric varices Diagnosis Code(s):    --- Professional ---                       K74.60, Unspecified cirrhosis of liver                       K92.0, Hematemesis                       K92.2, Gastrointestinal hemorrhage, unspecified                       K31.89, Other diseases of stomach and duodenum                       K76.6, Portal hypertension                        I85.10, Secondary esophageal varices without bleeding CPT copyright 2018 American Medical Association. All rights reserved. The codes documented in this report are preliminary and upon coder review may  be revised to meet current compliance requirements. Efrain Sella MD, MD 04/25/2018 6:53:45 PM This report has been signed electronically. Number of Addenda: 0 Note Initiated On: 04/05/2018 6:15 PM      [facility]

## 2018-04-09 NOTE — Consult Note (Addendum)
PULMONARY / CRITICAL CARE MEDICINE   Name: [name] MRN: [id number] DOB: 1955/11/14    ADMISSION DATE:  04/25/2018 CONSULTATION DATE:  04/24/2018  REFERRING MD:  Dr. Margaretmary Eddy  CHIEF COMPLAINT:  Hematemesis  BRIEF DISCUSSION: 63 y.o. with a PMH notable for Cirrhosis, Hepatitis C, Stage IV Hepatocellular carcinoma, who is admitted with Upper GI bleed secondary to Esophageal Varices. She underwent urgent EGD which revealed grade 1 Esophageal varices in the lower third of the esophagus, with successful banding.  She returns to ICU post procedure, remains intubated. Follow up CXR with new infiltrate concerning for aspiration pneumonia, positive for UTI, with developing sepsis.  HISTORY OF PRESENT ILLNESS:   [name] is a 63 y.o. Female with a PMH notable for cirrhosis, Hepatitis C, Stage IV hepatocellular carcinoma, COPD, Pneumonia, smoker, TIA, ETOH abuse who presents to [facility] ED on 04/18/2018 with c/o hematemesis.  She reported that she had coffee ground emesis that started last night 1/14.  She also reported associated abdominal distention and constipation for the last week, but denied abdominal pain.  Initial workup in the ED revealed Potassium 5.5, serum CO2 20, Ammonia 60, WBC 18.8, Hemoglobin 10.3, Alkaline phosphatase 455, AST 495, and ALT 150.  CT Abdomen & Pelvis negative for any acute process, but did reveal advanced cirrhosis with portal hypertension, stable multifocal hepatocellular carcinoma, and small amount of ascites.  She was hemodynamically stable despite having active coffee ground emesis.  She was placed on Protonix and Octreotide drips, Rocephin,  and GI was consulted.  GI took her for urgent EGD, which revealed grade I Esophageal varices in the lower third of the esophagus.  Successful banding was employed.  She returns to the ICU post procedure intubated. Follow up CXR with new infiltrate concerning for aspiration pneumonia. UA is positive for UTI. She is admitted to [facility] ICU for  treatment of Acute upper GI Bleed secondary to Esophageal varices, and Sepsis secondary to UTI and questionable aspiration pneumonia.  PCCM is consulted for further management.  PAST MEDICAL HISTORY :  She  has a past medical history of Acquired hypothyroidism, Allergy, Bipolar 1 disorder ([place]) (1990s), Chronic hepatitis C ([place]), Compensated HCV cirrhosis ([place]), Complication of anesthesia, Complication of anesthesia, COPD (chronic obstructive pulmonary disease) ([place]), Depression, GERD (gastroesophageal reflux disease), Hepatitis C, History of alcohol abuse, Insomnia, Migraines, Pneumonia, PONV (postoperative nausea and vomiting), Portal hypertension ([place]), Portal hypertensive gastropathy ([place]) (2013), Smoker, Stroke [facility]), TIA (transient ischemic attack), and Varicose veins.  PAST SURGICAL HISTORY: She  has a past surgical history that includes Appendectomy; Liver biopsy; Diagnostic laparoscopy; Thyroidectomy (05/17/2011); left foot surgery (1990s); Esophagogastroduodenoscopy (10/2010); foot surgery (Bilateral, 2017); Electormagnetic navigation bronchoscopy (N/A, 08/15/2017); and IR Radiologist Eval & Mgmt (02/19/2018).  Allergies  Allergen Reactions  . Demerol Anaphylaxis  . Other Other (See Comments)    general anesthesia - heart stopped   . Penicillins Anaphylaxis, Hives and Itching    Has patient had a PCN reaction causing immediate rash, facial/tongue/throat swelling, SOB or lightheadedness with hypotension: Yes Has patient had a PCN reaction causing severe rash involving mucus membranes or skin necrosis: Yes Has patient had a PCN reaction that required hospitalization: Yes Has patient had a PCN reaction occurring within the last 10 years: No If all of the above answers are "NO", then may proceed with Cephalosporin use.   Marland Kitchen Propofol Other (See Comments)    Allergic, caused her to code   . Latex Rash  . Oxycodone Hcl Rash    No current facility-administered  medications on file prior to  encounter.    Current Outpatient Medications on File Prior to Encounter  Medication Sig  . ALPRAZolam (XANAX) 0.5 MG tablet Take 1 tablet (0.5 mg total) by mouth 2 (two) times daily as needed for anxiety.  Marland Kitchen aspirin EC 81 MG tablet Take 1 tablet by mouth daily.  . CHANTIX 0.5 MG tablet TAKE 1 TABLET BY MOUTH 2 TIMES DAILY  . furosemide (LASIX) 20 MG tablet TAKE 2 TABLETS BY MOUTH DAILY  . ibuprofen (ADVIL,MOTRIN) 200 MG tablet Take 200-400 mg by mouth daily as needed for headache or moderate pain.  Marland Kitchen ondansetron (ZOFRAN) 4 MG tablet Take 1 tablet (4 mg total) by mouth every 8 (eight) hours as needed for nausea or vomiting.  . Polyethylene Glycol 3350 (PEG 3350) POWD Take 17 g by mouth daily.  Marland Kitchen spironolactone (ALDACTONE) 25 MG tablet TAKE 4 TABLETS BY MOUTH DAILY  . SYNTHROID 88 MCG tablet TAKE 1 TABLET BY MOUTH ONCE DAILY  . umeclidinium bromide (INCRUSE ELLIPTA) 62.5 MCG/INH AEPB Inhale 1 puff into the lungs daily.    FAMILY HISTORY:  Her She indicated that her mother is alive. She indicated that her father is alive. She indicated that her sister is alive. She indicated that her brother is alive. She indicated that the status of her neg hx is unknown.   SOCIAL HISTORY: She  reports that she has been smoking cigarettes. She has a 10.00 pack-year smoking history. She has quit using smokeless tobacco.  Her smokeless tobacco use included chew. She reports that she does not drink alcohol or use drugs.  REVIEW OF SYSTEMS:   Unable to assess due to intubation and sedation  SUBJECTIVE:  Unable to assess due to intubation and sedation  VITAL SIGNS: BP (!) 154/50   Pulse 96   Temp (!) 97.5 F (36.4 C) (Oral)   Resp 16   Ht '5\' 3"'  (1.6 m)   Wt 71.9 kg   SpO2 97%   BMI 28.08 kg/m   HEMODYNAMICS:    VENTILATOR SETTINGS: Vent Mode: PRVC FiO2 (%):  [80 %] 80 % Set Rate:  [16 bmp] 16 bmp Vt Set:  [450 mL] 450 mL PEEP:  [5 cmH20] 5 cmH20  INTAKE / OUTPUT: No intake/output data  recorded.  PHYSICAL EXAMINATION: General:  Acutely ill appearing female, laying in bed, sedated, intubated, in NAD Neuro:  Sedated, withdraws from pain, Pupils PERRL 2 mm sluggish bilaterally HEENT:  Atraumatic, normocephalic, neck supple, no JVD, ETT in place Cardiovascular:  RRR, s1s2, no M/R/G, 2+ pulses throughout Lungs:  Clear to auscultation bilaterally, even, nonlabored, vent assisted Abdomen:  Slightly distended, soft, nontender, no guarding or rebound tenderness, BS hypoactive Musculoskeletal:  No deformities, normal bulk and tone, no edema Skin:  Warm/dry.  Jaundiced. No obvious rashes, lesions, or ulcerations  LABS:  BMET Recent Labs  Lab 04/07/18 0837 03/29/2018 1415 04/25/2018 2022  NA 127* 129* 132*  K 4.1 5.5* 5.3*  CL 96* 96* 100  CO2 21* 20* 19*  BUN 14 26* 32*  CREATININE 0.83 0.81 0.82  GLUCOSE 123* 97 121*    Electrolytes Recent Labs  Lab 04/07/18 0837 04/20/2018 1415 03/31/2018 2022  CALCIUM 8.7* 7.7* 7.3*  MG  --   --  2.0    CBC Recent Labs  Lab 04/07/18 0837 04/10/2018 1415 04/03/2018 2022  WBC 14.3* 18.8* 30.4*  HGB 10.9* 10.3* 10.0*  HCT 33.7* 32.2* 31.6*  PLT 194 203 275    Coag's Recent Labs  Lab 04/07/18 0837 03/28/2018 1446 04/03/2018 1636  APTT  --   --  31  INR 1.19 1.44 1.50    Sepsis Markers No results for input(s): LATICACIDVEN, PROCALCITON, O2SATVEN in the last 168 hours.  ABG Recent Labs  Lab 04/23/2018 2051  PHART 7.28*  PCO2ART 44  PO2ART 147*    Liver Enzymes Recent Labs  Lab 04/07/18 0837 04/15/2018 1415  AST 295*  294* 495*  ALT 115*  117* 150*  ALKPHOS 430*  460* 455*  BILITOT 5.5*  5.4* 9.0*  ALBUMIN 2.7*  2.8* 2.6*    Cardiac Enzymes No results for input(s): TROPONINI, PROBNP in the last 168 hours.  Glucose No results for input(s): GLUCAP in the last 168 hours.  Imaging Ct Abdomen Pelvis Wo Contrast  Result Date: 04/17/2018 CLINICAL DATA:  reports vomiting dark red blood, states four episodes  today. Denies hx of same, denies blood thinners, denies alcohol use. Pt tachypneic, appears short of breath. Pt is a cancer patient, currently being treated for liver cancer. Pt abdomen very taut and distended. EXAM: CT ABDOMEN AND PELVIS WITHOUT CONTRAST TECHNIQUE: Multidetector CT imaging of the abdomen and pelvis was performed following the standard protocol without IV contrast. COMPARISON:  PET-CT, 01/31/2018 FINDINGS: Lower chest: No acute findings. Hepatobiliary: Liver shows extensive nodularity. There is enlargement of the inferior aspect of the right lobe and lateral segment of the left lobe. Subtle hypoattenuating areas are noted in the liver, poorly defined on this unenhanced study, consistent with the known multifocal hepatocellular carcinoma. Gallbladder is unremarkable.  No bile duct dilation. Pancreas: Unremarkable. No pancreatic ductal dilatation or surrounding inflammatory changes. Spleen: Spleen borderline enlarged measuring 12.3 cm in greatest dimension, stable from the prior PET-CT. No splenic mass or focal lesion. Adrenals/Urinary Tract: No adrenal masses. Kidneys normal size, orientation and position. No renal masses or stones. No hydronephrosis. Ureters normal in course and in caliber. Bladder is unremarkable. Stomach/Bowel: Small hiatal hernia. Stomach is otherwise unremarkable. Small bowel is normal in caliber with no wall thickening or adjacent inflammation. Colon is normal in caliber. No wall thickening or convincing inflammation. Vascular/Lymphatic: There are numerous venous collaterals in the upper abdomen, with prominent paraesophageal varices. These are stable from the prior PET-CT. There are dense atherosclerotic calcifications along a normal caliber abdominal aorta. There are multiple prominent and enlarged upper abdominal lymph nodes along the peri celiac and gastrohepatic ligaments, stable from the prior PET-CT. Reproductive: Uterus and bilateral adnexa are unremarkable. Other:  There is now a small amount of ascites collecting adjacent to the liver, and tracking into the pelvis collecting in the pelvic recesses. Musculoskeletal: No fracture or acute finding. No osteoblastic or osteolytic lesions. IMPRESSION: 1. No acute findings within the abdomen or pelvis. 2. Advanced cirrhosis with portal venous hypertension. The multifocal hepatocellular carcinoma noted on the prior PET-CT is not well-defined on this unenhanced study, but shows no convincing change. 3. Prominent and enlarged upper abdominal lymph nodes, stable from the prior PET-CT. 4. Since the prior PET-CT, a small amount of ascites has developed. Electronically Signed   By: Lajean Manes M.D.   On: 04/12/2018 15:33   Portable Chest X-ray  Result Date: 03/29/2018 CLINICAL DATA:  Evaluate endotracheal tube. EXAM: PORTABLE CHEST 1 VIEW COMPARISON:  03/21/2018 FINDINGS: Endotracheal tube tip projects approximately 4 mm above the Carina, pointed towards the right mainstem bronchus orifice. Cardiac silhouette is normal in size. No mediastinal or hilar masses. Lungs show chronically prominent bronchovascular markings. No evidence of pneumonia or pulmonary edema.  No pleural effusion or pneumothorax. Multiple surgical vascular clips at the neck base consistent with a prior thyroidectomy. IMPRESSION: 1. Endotracheal tube tip projects low, only approximately 4 mm above the inferior margin of the Carina, angled towards the right mainstem bronchus. Consider retracting 1 to 1.5 cm. 2. No acute cardiopulmonary disease. Electronically Signed   By: Lajean Manes M.D.   On: 04/08/2018 19:39     STUDIES:  CT Abdomen & Pelvis 04/01/2018>> Advanced cirrhosis with portal venous hypertension. The multifocal hepatocellular carcinoma noted on the prior PET-CT is not well-defined on this unenhanced study, but shows no convincing change.Prominent and enlarged upper abdominal lymph nodes, stable from the prior PET-CT.  Since the prior PET-CT, a small  amount of ascites has developed.  Upper Endoscopy 03/29/2018>> Grade I varices in the lower third of the esophagus. Active bleeding noted.  Three bands successfully placed with complete eradication.  Moderate portal hypertensive gastropathy in the entire stomach.  CULTURES: Urine 04/20/2018>> Blood 03/28/2018 x2 >> Tracheal aspirate 04/10/18>>  ANTIBIOTICS: Rocephin 04/03/2018>> Flagyl 04/10/18>>  SIGNIFICANT EVENTS: 03/31/2018>> Presented to [facility] ED, underwent EGD with banding of esophageal varices, returns to ICU post procedure intubated  LINES/TUBES: ETT 04/23/2018>> Right IJ CVC 04/14/2018>>   ASSESSMENT / PLAN:  PULMONARY A: Intubated for Airway protection COPD without acute exacerbation CXR with new infiltrate concerning for questionable aspiration pneumonia Hx: COPD, Pneumonia P:   Full vent support, PRVC: 8 cc/kg Wean FiO2 and PEEP as tolerated Follow intermittent CXR and ABG SBT when parameters met VAP bundle Obtain sputum culture Continue Rocephin, will add Flagyl Prn Bronchodilators  CARDIOVASCULAR A: Hypotension, likely secondary to sedation +/- Septic shock Tall peaked T waves on telemetry Hx: TIA P:  Cardiac monitoring Maintain MAP >65 IVF: NS @ 75 ml/hr  Will give 1L NS bolus Levophed if needed to maintain MAP goal Check EKG, Troponin, Potassium and Magnesium  RENAL A:   Mild Hyperkalemia Mild Hyponatremia Anion gap metabolic acidosis in setting of lactic acidosis P:   Monitor I&O's / urinary output Follow BMP Ensure adequate renal perfusion Avoid nephrotoxic agents as able Replace electrolytes as indicated Pt received Kayexalate F/u serum Potassium NS @ 75 ml/hr Given 1L NS bolus Trend Lactic acid  GASTROINTESTINAL A:   Acute Upper GI bleed secondary to Esophageal varices Hyperammonemia Liver cirrhosis with portal hypertension Hx: Hepatitis C, Hepatocellular Carcinoma Stage IV P:   NPO Do NOT place OG/NG tube until cleared by GI S/p EGD with  banding of varices Continue IV Protonix and Octreotide drips Continue Rocephin GI and Oncology consulted, appreciate input Trend CMP Trend Ammonia  HEMATOLOGIC A:   Acute anemia secondary to GI Bleed P:  Monitor for s/sx of bleeding Trend CBC Follow H&H q4h SCD's for VTE Prophylaxis Transfuse pRBCs for Hgb<8  INFECTIOUS A:   Leukocytosis Meets SIRS Criteria Sepsis secondary to UTI and ? Aspiration Pneumonia  -CT Abdomen & Pelvis negative for acute processes. Revealed advanced cirrhosis, stable hepatocellular carcinoma, and small amount of ascites -CXR with new lingular opacity P:   Monitor fever curve Trend WBCs and Procalcitonin Follow cultures as above Continue Rocephin and Flagyl   ENDOCRINE A:   No acute issues  P:   Follow glucose on BMP Follow ICU Hypo/hyperglycemia protocol  NEUROLOGIC A:   Sedation needs in setting of mechanical ventilation Hx: Migraines, insomnia, ETOH abuse, Depression, bipolar disorder P:   RASS goal: 0 to -1 Versed and Precedex to maintain RASS goal Avoid sedating meds as able Daily WUA Provide  supportive care   FAMILY  - Updates:  Updated pt's 2 sisters at bedside 03/29/2018.  They confirm pt is a full code.  - Inter-disciplinary family meet or Palliative Care meeting due by:  14-May-2018    Darel Hong, AGACNP-BC [place] [facility] Pager: [phone] Cell: [phone]  04/07/2018, 9:05 PM

## 2018-04-09 NOTE — Anesthesia Post-op Follow-up Note (Signed)
Anesthesia QCDR form completed.        

## 2018-04-09 NOTE — ED Notes (Signed)
Patient to ENDO at this time.

## 2018-04-09 NOTE — Telephone Encounter (Signed)
Left voicemail for patient to return our phone call. She is scheduled for imaging on 03/18/19 and to see Dr. Grayland Ormond on 04/18/18 at 10 am. Both appts are in [place].  Thanks,  Faythe Casa, NP 04/17/2018 9:37 AM

## 2018-04-10 ENCOUNTER — Ambulatory Visit: Payer: Medicare HMO

## 2018-04-10 ENCOUNTER — Encounter: Payer: Self-pay | Admitting: Internal Medicine

## 2018-04-10 ENCOUNTER — Inpatient Hospital Stay
Admit: 2018-04-10 | Discharge: 2018-04-10 | Disposition: A | Discharge status: Home / Self Care | Payer: Medicare HMO | Attending: Pulmonary Disease | Admitting: Pulmonary Disease

## 2018-04-10 ENCOUNTER — Other Ambulatory Visit: Payer: Self-pay

## 2018-04-10 ENCOUNTER — Inpatient Hospital Stay: Payer: Medicare HMO

## 2018-04-10 DIAGNOSIS — B1920 Unspecified viral hepatitis C without hepatic coma: Secondary | ICD-10-CM

## 2018-04-10 DIAGNOSIS — Z515 Encounter for palliative care: Secondary | ICD-10-CM

## 2018-04-10 DIAGNOSIS — R9431 Abnormal electrocardiogram [ECG] [EKG]: Secondary | ICD-10-CM

## 2018-04-10 DIAGNOSIS — J9600 Acute respiratory failure, unspecified whether with hypoxia or hypercapnia: Secondary | ICD-10-CM

## 2018-04-10 DIAGNOSIS — K7460 Unspecified cirrhosis of liver: Secondary | ICD-10-CM

## 2018-04-10 DIAGNOSIS — C227 Other specified carcinomas of liver: Secondary | ICD-10-CM

## 2018-04-10 DIAGNOSIS — F17210 Nicotine dependence, cigarettes, uncomplicated: Secondary | ICD-10-CM

## 2018-04-10 DIAGNOSIS — J9601 Acute respiratory failure with hypoxia: Secondary | ICD-10-CM

## 2018-04-10 DIAGNOSIS — D72829 Elevated white blood cell count, unspecified: Secondary | ICD-10-CM

## 2018-04-10 DIAGNOSIS — R188 Other ascites: Secondary | ICD-10-CM

## 2018-04-10 LAB — GLUCOSE, CAPILLARY
GLUCOSE-CAPILLARY: 125 mg/dL — AB (ref 70–99)
Glucose-Capillary: 100 mg/dL — ABNORMAL HIGH (ref 70–99)
Glucose-Capillary: 111 mg/dL — ABNORMAL HIGH (ref 70–99)
Glucose-Capillary: 131 mg/dL — ABNORMAL HIGH (ref 70–99)
Glucose-Capillary: 132 mg/dL — ABNORMAL HIGH (ref 70–99)
Glucose-Capillary: 139 mg/dL — ABNORMAL HIGH (ref 70–99)
Glucose-Capillary: 158 mg/dL — ABNORMAL HIGH (ref 70–99)

## 2018-04-10 LAB — BLOOD GAS, ARTERIAL
Acid-base deficit: 3.1 mmol/L — ABNORMAL HIGH (ref 0.0–2.0)
Bicarbonate: 21.2 mmol/L (ref 20.0–28.0)
FIO2: 0.6
MECHVT: 450 mL
Mechanical Rate: 18
O2 Saturation: 99.2 %
PATIENT TEMPERATURE: 37
PEEP: 5 cmH2O
PO2 ART: 143 mmHg — AB (ref 83.0–108.0)
pCO2 arterial: 35 mmHg (ref 32.0–48.0)
pH, Arterial: 7.39 (ref 7.350–7.450)

## 2018-04-10 LAB — CBC
HCT: 27.5 % — ABNORMAL LOW (ref 36.0–46.0)
Hemoglobin: 8.7 g/dL — ABNORMAL LOW (ref 12.0–15.0)
MCH: 28.2 pg (ref 26.0–34.0)
MCHC: 31.6 g/dL (ref 30.0–36.0)
MCV: 89 fL (ref 80.0–100.0)
Platelets: 243 10*3/uL (ref 150–400)
RBC: 3.09 MIL/uL — ABNORMAL LOW (ref 3.87–5.11)
RDW: 19.3 % — ABNORMAL HIGH (ref 11.5–15.5)
WBC: 22.3 10*3/uL — ABNORMAL HIGH (ref 4.0–10.5)
nRBC: 0.1 % (ref 0.0–0.2)

## 2018-04-10 LAB — HEMOGLOBIN AND HEMATOCRIT, BLOOD
HCT: 26.7 % — ABNORMAL LOW (ref 36.0–46.0)
HCT: 28.2 % — ABNORMAL LOW (ref 36.0–46.0)
HEMATOCRIT: 26.7 % — AB (ref 36.0–46.0)
Hemoglobin: 8.5 g/dL — ABNORMAL LOW (ref 12.0–15.0)
Hemoglobin: 8.6 g/dL — ABNORMAL LOW (ref 12.0–15.0)
Hemoglobin: 9 g/dL — ABNORMAL LOW (ref 12.0–15.0)

## 2018-04-10 LAB — AMMONIA: Ammonia: 105 umol/L — ABNORMAL HIGH (ref 9–35)

## 2018-04-10 LAB — URINALYSIS, COMPLETE (UACMP) WITH MICROSCOPIC
Bacteria, UA: NONE SEEN
Glucose, UA: NEGATIVE mg/dL
KETONES UR: NEGATIVE mg/dL
Nitrite: NEGATIVE
Protein, ur: 30 mg/dL — AB
Specific Gravity, Urine: 1.024 (ref 1.005–1.030)
WBC, UA: >50 WBC/hpf — ABNORMAL HIGH (ref 0–5)
pH: 6 (ref 5.0–8.0)

## 2018-04-10 LAB — COMPREHENSIVE METABOLIC PANEL
ALT: 457 U/L — ABNORMAL HIGH (ref 0–44)
AST: 1882 U/L — ABNORMAL HIGH (ref 15–41)
Albumin: 2.1 g/dL — ABNORMAL LOW (ref 3.5–5.0)
Alkaline Phosphatase: 578 U/L — ABNORMAL HIGH (ref 38–126)
Anion gap: 8 (ref 5–15)
BILIRUBIN TOTAL: 7.6 mg/dL — AB (ref 0.3–1.2)
BUN: 35 mg/dL — AB (ref 8–23)
CO2: 21 mmol/L — ABNORMAL LOW (ref 22–32)
Calcium: 6.7 mg/dL — ABNORMAL LOW (ref 8.9–10.3)
Chloride: 104 mmol/L (ref 98–111)
Creatinine, Ser: 0.82 mg/dL (ref 0.44–1.00)
GFR calc Af Amer: >60 mL/min (ref >60)
GFR calc non Af Amer: >60 mL/min (ref >60)
Glucose, Bld: 187 mg/dL — ABNORMAL HIGH (ref 70–99)
POTASSIUM: 4.8 mmol/L (ref 3.5–5.1)
Sodium: 133 mmol/L — ABNORMAL LOW (ref 135–145)
Total Protein: 6.2 g/dL — ABNORMAL LOW (ref 6.5–8.1)

## 2018-04-10 LAB — TROPONIN I: Troponin I: <0.03 ng/mL

## 2018-04-10 LAB — ECHOCARDIOGRAM COMPLETE
Height: 63 in
Weight: 2536.17 [oz_av]

## 2018-04-10 LAB — PROCALCITONIN: Procalcitonin: 0.67 ng/mL

## 2018-04-10 LAB — POTASSIUM: Potassium: 5.6 mmol/L — ABNORMAL HIGH (ref 3.5–5.1)

## 2018-04-10 LAB — LACTIC ACID, PLASMA
Lactic Acid, Venous: 2.5 mmol/L (ref 0.5–1.9)
Lactic Acid, Venous: 3.5 mmol/L (ref 0.5–1.9)

## 2018-04-10 LAB — MAGNESIUM: Magnesium: 1.9 mg/dL (ref 1.7–2.4)

## 2018-04-10 MED ORDER — INSULIN ASPART 100 UNIT/ML ~~LOC~~ SOLN
0.0000 [IU] | SUBCUTANEOUS | Status: DC
  Administered 2018-04-10: 2 [IU] via SUBCUTANEOUS
  Administered 2018-04-10: 3 [IU] via SUBCUTANEOUS
  Administered 2018-04-10 – 2018-04-13 (×13): 2 [IU] via SUBCUTANEOUS
  Filled 2018-04-10 (×13): qty 1

## 2018-04-10 MED ORDER — INSULIN REGULAR HUMAN 100 UNIT/ML IJ SOLN
10.0000 [IU] | Freq: Once | INTRAMUSCULAR | Status: AC
  Administered 2018-04-10: 10 [IU] via INTRAVENOUS
  Filled 2018-04-10: qty 10

## 2018-04-10 MED ORDER — METRONIDAZOLE IN NACL 5-0.79 MG/ML-% IV SOLN
500.0000 mg | Freq: Four times a day (QID) | INTRAVENOUS | Status: DC
  Administered 2018-04-10 – 2018-04-14 (×18): 500 mg via INTRAVENOUS
  Filled 2018-04-10 (×24): qty 100

## 2018-04-10 MED ORDER — CALCIUM GLUCONATE-NACL 1-0.675 GM/50ML-% IV SOLN
1.0000 g | Freq: Once | INTRAVENOUS | Status: AC
  Administered 2018-04-10: 1000 mg via INTRAVENOUS
  Filled 2018-04-10: qty 50

## 2018-04-10 MED ORDER — DEXTROSE 50 % IV SOLN
1.0000 | Freq: Once | INTRAVENOUS | Status: AC
  Administered 2018-04-10: 50 mL via INTRAVENOUS
  Filled 2018-04-10: qty 50

## 2018-04-10 MED ORDER — LACTULOSE ENEMA
300.0000 mL | Freq: Two times a day (BID) | ORAL | Status: AC
  Administered 2018-04-10 – 2018-04-11 (×4): 300 mL via RECTAL
  Filled 2018-04-10 (×5): qty 300

## 2018-04-10 MED ORDER — SODIUM CHLORIDE 0.9% FLUSH: 10.0000 mL | INTRAVENOUS | Status: DC | PRN

## 2018-04-10 MED ORDER — SODIUM CHLORIDE 0.9 % IV BOLUS
1000.0000 mL | Freq: Once | INTRAVENOUS | Status: AC
  Administered 2018-04-10: 1000 mL via INTRAVENOUS

## 2018-04-10 MED ORDER — SODIUM POLYSTYRENE SULFONATE 15 GM/60ML PO SUSP
30.0000 g | Freq: Once | ORAL | Status: AC
  Filled 2018-04-10: qty 120

## 2018-04-10 MED ORDER — DOPAMINE-DEXTROSE 3.2-5 MG/ML-% IV SOLN: 0.0000 ug/kg/min | INTRAVENOUS | Status: DC

## 2018-04-10 MED ORDER — LACTATED RINGERS IV BOLUS
1000.0000 mL | Freq: Once | INTRAVENOUS | Status: AC
  Administered 2018-04-10: 1000 mL via INTRAVENOUS

## 2018-04-10 MED ORDER — SODIUM CHLORIDE 0.9% FLUSH
10.0000 mL | Freq: Two times a day (BID) | INTRAVENOUS | Status: DC
  Administered 2018-04-10 – 2018-04-12 (×4): 10 mL
  Administered 2018-04-12: 40 mL
  Administered 2018-04-13 – 2018-04-14 (×3): 10 mL

## 2018-04-10 NOTE — Progress Notes (Signed)
Pt remains mechanically intubated post endoscopy on 04/02/2018.  I spoke with pts sisters [name] and [name] who are her HCPOA regarding goals of treatment and code status.  They have both decided they would like to change pts code status to DO NOT RESUSCITATE.  Therefore, code status changed to DNR will continue to monitor and treat pt.  Marda Stalker, [place] Pager [phone] (please enter 7 digits) PCCM Consult Pager [phone] (please enter 7 digits)

## 2018-04-10 NOTE — Progress Notes (Signed)
[facility] [place] NAME: [name]    MR#:  [id number]  DATE OF BIRTH:  1956-03-05  SUBJECTIVE:  CHIEF COMPLAINT:   Chief Complaint  Patient presents with  . GI Bleeding   -Intubated for possible aspiration pneumonia.  Remains on the vent.  Critically ill-appearing -Admitted with hematemesis and had esophageal variceal banding done on 04/20/2018  REVIEW OF SYSTEMS:  Review of Systems  Unable to perform ROS: Critical illness    DRUG ALLERGIES:   Allergies  Allergen Reactions  . Demerol Anaphylaxis  . Other Other (See Comments)    general anesthesia - heart stopped   . Penicillins Anaphylaxis, Hives and Itching    Has patient had a PCN reaction causing immediate rash, facial/tongue/throat swelling, SOB or lightheadedness with hypotension: Yes Has patient had a PCN reaction causing severe rash involving mucus membranes or skin necrosis: Yes Has patient had a PCN reaction that required hospitalization: Yes Has patient had a PCN reaction occurring within the last 10 years: No If all of the above answers are "NO", then may proceed with Cephalosporin use.   Marland Kitchen Propofol Other (See Comments)    Allergic, caused her to code   . Latex Rash  . Oxycodone Hcl Rash    VITALS:  Blood pressure (!) 127/56, pulse 70, temperature 98.5 F (36.9 C), temperature source Axillary, resp. rate (!) 26, height 5\' 3"  (1.6 m), weight 71.9 kg, SpO2 94 %.  PHYSICAL EXAMINATION:  Physical Exam  GENERAL:  63 y.o.-year-old patient lying in the bed, intubated and on vent.  Critically ill-appearing EYES: Pupils equal, round, reactive to light and accommodation.  Positive for scleral icterus. Extraocular muscles intact.  HEENT: Head atraumatic, normocephalic. Oropharynx and nasopharynx clear.  NECK:  Supple, no jugular venous distention. No thyroid enlargement, no tenderness.  LUNGS: Normal breath sounds bilaterally, no wheezing, rales,rhonchi or  crepitation. No use of accessory muscles of respiration.  Decreased bibasilar breath sounds noted CARDIOVASCULAR: S1, S2 normal. No  rubs, or gallops. 2/6 systolic murmur present ABDOMEN: Soft, distended, hypoactive Bowel sounds present. No organomegaly or mass.  EXTREMITIES: No pedal edema, cyanosis, or clubbing.  NEUROLOGIC: Intubated and sedated.  PSYCHIATRIC: The patient is sedated SKIN: No obvious rash, lesion, or ulcer.  Telangiectasias noted on the chest.  Palmar erythema present   LABORATORY PANEL:   CBC Recent Labs  Lab 04/10/18 0448  WBC 22.3*  HGB 8.7*  HCT 27.5*  PLT 243   ------------------------------------------------------------------------------------------------------------------  Chemistries  Recent Labs  Lab 04/20/2018 2340 04/10/18 0448  NA  --  133*  K 5.6* 4.8  CL  --  104  CO2  --  21*  GLUCOSE  --  187*  BUN  --  35*  CREATININE  --  0.82  CALCIUM  --  6.7*  MG 1.9  --   AST  --  1,882*  ALT  --  457*  ALKPHOS  --  578*  BILITOT  --  7.6*   ------------------------------------------------------------------------------------------------------------------  Cardiac Enzymes Recent Labs  Lab 04/08/2018 2340  TROPONINI <0.03   ------------------------------------------------------------------------------------------------------------------  RADIOLOGY:  Ct Abdomen Pelvis Wo Contrast  Result Date: 04/18/2018 CLINICAL DATA:  reports vomiting dark red blood, states four episodes today. Denies hx of same, denies blood thinners, denies alcohol use. Pt tachypneic, appears short of breath. Pt is a cancer patient, currently being treated for liver cancer. Pt abdomen very taut and distended. EXAM: CT ABDOMEN AND PELVIS WITHOUT CONTRAST TECHNIQUE: Multidetector  CT imaging of the abdomen and pelvis was performed following the standard protocol without IV contrast. COMPARISON:  PET-CT, 01/31/2018 FINDINGS: Lower chest: No acute findings. Hepatobiliary: Liver  shows extensive nodularity. There is enlargement of the inferior aspect of the right lobe and lateral segment of the left lobe. Subtle hypoattenuating areas are noted in the liver, poorly defined on this unenhanced study, consistent with the known multifocal hepatocellular carcinoma. Gallbladder is unremarkable.  No bile duct dilation. Pancreas: Unremarkable. No pancreatic ductal dilatation or surrounding inflammatory changes. Spleen: Spleen borderline enlarged measuring 12.3 cm in greatest dimension, stable from the prior PET-CT. No splenic mass or focal lesion. Adrenals/Urinary Tract: No adrenal masses. Kidneys normal size, orientation and position. No renal masses or stones. No hydronephrosis. Ureters normal in course and in caliber. Bladder is unremarkable. Stomach/Bowel: Small hiatal hernia. Stomach is otherwise unremarkable. Small bowel is normal in caliber with no wall thickening or adjacent inflammation. Colon is normal in caliber. No wall thickening or convincing inflammation. Vascular/Lymphatic: There are numerous venous collaterals in the upper abdomen, with prominent paraesophageal varices. These are stable from the prior PET-CT. There are dense atherosclerotic calcifications along a normal caliber abdominal aorta. There are multiple prominent and enlarged upper abdominal lymph nodes along the peri celiac and gastrohepatic ligaments, stable from the prior PET-CT. Reproductive: Uterus and bilateral adnexa are unremarkable. Other: There is now a small amount of ascites collecting adjacent to the liver, and tracking into the pelvis collecting in the pelvic recesses. Musculoskeletal: No fracture or acute finding. No osteoblastic or osteolytic lesions. IMPRESSION: 1. No acute findings within the abdomen or pelvis. 2. Advanced cirrhosis with portal venous hypertension. The multifocal hepatocellular carcinoma noted on the prior PET-CT is not well-defined on this unenhanced study, but shows no convincing change.  3. Prominent and enlarged upper abdominal lymph nodes, stable from the prior PET-CT. 4. Since the prior PET-CT, a small amount of ascites has developed. Electronically Signed   By: Lajean Manes M.D.   On: 03/30/2018 15:33   Dg Chest 1 View  Result Date: 04/10/2018 CLINICAL DATA:  Central line placement EXAM: CHEST  1 VIEW COMPARISON:  04/18/2018 at 1920 hours FINDINGS: Right IJ venous catheter terminates in the mid SVC. Endotracheal tube terminates 4 cm above the carina. Mild hazy lingular opacity, new. No pleural effusion or pneumothorax. The heart is normal in size. IMPRESSION: Right IJ venous catheter terminates in the mid SVC. Electronically Signed   By: Julian Hy M.D.   On: 04/10/2018 00:17   Portable Chest Xray  Result Date: 04/10/2018 CLINICAL DATA:  Endotracheal tube present EXAM: PORTABLE CHEST 1 VIEW COMPARISON:  Yesterday FINDINGS: Endotracheal tube tip between the clavicular heads and carina. Right IJ line with tip at the SVC. Retrocardiac airspace disease. No edema, effusion, or pneumothorax. Normal heart size. IMPRESSION: 1. Stable hardware positioning. 2. Retrocardiac atelectasis or infection. Electronically Signed   By: Monte Fantasia M.D.   On: 04/10/2018 06:28   Portable Chest X-ray  Result Date: 04/21/2018 CLINICAL DATA:  Evaluate endotracheal tube. EXAM: PORTABLE CHEST 1 VIEW COMPARISON:  03/21/2018 FINDINGS: Endotracheal tube tip projects approximately 4 mm above the Carina, pointed towards the right mainstem bronchus orifice. Cardiac silhouette is normal in size. No mediastinal or hilar masses. Lungs show chronically prominent bronchovascular markings. No evidence of pneumonia or pulmonary edema. No pleural effusion or pneumothorax. Multiple surgical vascular clips at the neck base consistent with a prior thyroidectomy. IMPRESSION: 1. Endotracheal tube tip projects low, only approximately 4 mm above  the inferior margin of the Carina, angled towards the right mainstem  bronchus. Consider retracting 1 to 1.5 cm. 2. No acute cardiopulmonary disease. Electronically Signed   By: Lajean Manes M.D.   On: 03/29/2018 19:39    EKG:   Orders placed or performed during the hospital encounter of 04/21/2018  . ED EKG  . ED EKG  . EKG 12-Lead  . EKG 12-Lead    ASSESSMENT AND PLAN:   63 year old female with past medical history significant for hepatitis C, liver cirrhosis, stage IV hepatocellular carcinoma, history of esophageal varices, bipolar, ongoing alcohol use presents to hospital secondary to hematemesis.  1.  Hematemesis-GI bleed, seen by GI and patient had EGD on admission.  Showing actively bleeding esophageal varices that were banded and bleeding was stopped. -Continue to keep n.p.o., remains on octreotide and Protonix drip -Appreciate GI consult -Hemoglobin is stable.  2.  Acute respiratory failure-hypoxic respiratory failure secondary to aspiration pneumonia -Management per ICU team -Continue to wean FiO2 currently on 60%, spontaneous breathing trial when tolerated.  Currently still on sedation -Continue IV antibiotics-Rocephin and Flagyl  3.  Septic shock-secondary to aspiration pneumonia -Follow-up echocardiogram -On low-dose Levophed.  Also on IV antibiotics which will be continued  4.  Acute alcoholic liver failure-continue to monitor LFTs.  Has underlying hepatocellular carcinoma, ongoing alcohol use and alcoholic liver cirrhosis.  5.  DVT prophylaxis-teds and SCDs for now   Critically ill-appearing.  High risk for cardiorespiratory arrest   All the records are reviewed and case discussed with Care Management/Social Workerr. Management plans discussed with the patient, family and they are in agreement.  CODE STATUS: Full code  TOTAL TIME TAKING CARE OF THIS PATIENT: 39 minutes.   POSSIBLE D/C IN ?  DAYS, DEPENDING ON CLINICAL CONDITION.   Gladstone Lighter M.D on 04/10/2018 at 8:48 AM  Between 7am to 6pm - Pager -  [phone]  After 6pm go to [email or web address] - password [facility]  Office  [phone]  CC: Primary care physician; Jinny Sanders, MD

## 2018-04-10 NOTE — Progress Notes (Signed)
Initial Nutrition Assessment  DOCUMENTATION CODES:   Non-severe (moderate) malnutrition in context of chronic illness  INTERVENTION:  Enteral access unable to be placed until cleared by GI.  Once patient able to obtain enteral access (and placement confirmed), recommend initiating Vital AF 1.2 at 15 mL/hr and advancing by 15 mL/hr every 8 hours to goal rate of 45 mL/hr (1080 mL goal daily volume) + Pro-Stat 30 mL BID. Provides 1496 kcal, 111 grams of protein, 875 mL H2O daily.  If tube feeds are initiated provide a minimum free water flush of 30 mL Q4hrs to maintain tube patency and a liquid MVI daily per tube.  NUTRITION DIAGNOSIS:   Moderate Malnutrition related to chronic illness(cirrhosis, COPD, hx EtOH abuse, stage IV HCC) as evidenced by mild fat depletion, mild-moderate muscle depletion.  GOAL:   Provide needs based on ASPEN/SCCM guidelines  MONITOR:   Vent status, Labs, Weight trends, TF tolerance, Skin, I & O's  REASON FOR ASSESSMENT:   Ventilator    ASSESSMENT:   63 year old female with PMHx of acquired hypothyroidism (thyroid goiter s/p thyroidectomy 05/17/2011), bipolar 1 disorder, GERD, depression, chronic hepatitis C, cirrhosis, portal hypertension, hx EtOH abuse, hx TIA, COPD, stage IV HCC, hx CVA who was admitted with hematemesis s/p EGD with banding of esophageal varices on 1/15 and returned to ICU post-procedure intubated, also with questionable aspiration PNA.   Patient intubated and sedated. On PRVC mode with FiO2 40% and PEEP 5 cmH2O. Abdomen distended and taut. On CT Abd/Pelvis from 1/15 stomach and small bowel unremarkable except for a small hiatal hernia. No wall thickening or adjacent inflammation. Noted she saw GI recently outpatient for constipation and had reported she only has 1 BM/week. Family at bedside report patient has not been eating well lately. This correlates with muscle and fat loss found on nutrition-focused physical exam (below). Per review of  weight history in chart she appears to fluctuate between 150-155 lbs. She is currently 71.9 kg (158.51 lbs).  Enteral Access: N/A; no enteral access  MAP: 55-80 mmHg  Patient is currently intubated on ventilator support Ve: 9.7 L/min Temp (24hrs), Avg:96.2 F (35.7 C), Min:92 F (33.3 C), Max:98.5 F (36.9 C)  Propofol: N/A  Medications reviewed and include: Novolog 0-15 units Q4hrs, lactulose 300 mL BID, pantoprazole, NS @ 75 mL/hr, ceftriaxone, Flagyl, Versed gtt, norepinephrine gtt at 8 mcg/min, octreotide.  Labs reviewed: CBG 111-158, Sodium 133, CO2 21, BUN 35, Alk Phos 578, AST 1882, ALT 457, Ammonia 105.  Discussed with RN.  NUTRITION - FOCUSED PHYSICAL EXAM:    Most Recent Value  Orbital Region  Mild depletion  Upper Arm Region  Mild depletion  Thoracic and Lumbar Region  Mild depletion  Buccal Region  Unable to assess  Temple Region  Moderate depletion  Clavicle Bone Region  Mild depletion  Clavicle and Acromion Bone Region  Moderate depletion  Scapular Bone Region  Unable to assess  Dorsal Hand  Mild depletion  Patellar Region  Mild depletion  Anterior Thigh Region  Mild depletion  Posterior Calf Region  No depletion  Edema (RD Assessment)  Mild  Hair  Reviewed  Eyes  Unable to assess  Mouth  Unable to assess  Skin  Reviewed [jaundice, ecchymosis, petechiae]  Nails  Reviewed     Diet Order:   Diet Order            Diet NPO time specified  Diet effective now  EDUCATION NEEDS:   Not appropriate for education at this time  Skin:  Skin Assessment: Reviewed RN Assessment(jaundice; ecchymosis and petechiae to bilateral arms)  Last BM:  04/10/2018 - medium type 6  Height:   Ht Readings from Last 1 Encounters:  04/06/2018 '5\' 3"'  (1.6 m)   Weight:   Wt Readings from Last 1 Encounters:  04/18/2018 71.9 kg   Ideal Body Weight:  52.3 kg  BMI:  Body mass index is 28.08 kg/m.  Estimated Nutritional Needs:   Kcal:  3267 (PSU 2003b w/ MSJ  1247, Ve 9.7, Tmax 36.9)  Protein:  86-108 grams (1.2-1.5 grams/kg)  Fluid:  1.5-1.7 L/day  Willey Blade, MS, RD, LDN Office: [phone] Pager: [phone] After Hours/Weekend Pager: [phone]

## 2018-04-10 NOTE — Progress Notes (Signed)
*  PRELIMINARY RESULTS* Echocardiogram 2D Echocardiogram has been performed.  [name] 04/10/2018, 10:00 AM

## 2018-04-10 NOTE — Progress Notes (Signed)
GI Inpatient Follow-up Note  HPI:  [name] is a 63 y/o Caucasian female with multiple chronic medical issues including Stage IV HCC, Child Pugh Class C cirrhosis, Hx of hepatitis C s/p viral treatment who was admitted yesterday with acute upper GI bleed. Emergent EGD performed yesterday by Dr. Alice Reichert showed grade I esophageal varices in lower 1/3 of distal esophagus with active bleeding seen. Three bands were successfully placed with resolution of bleeding.   Patient seen and examined in ICU this afternoon. Pt is intubated and on ventilator support for possible aspiration pneumonia. There have been no recurrent episodes of hematemesis. Palliative care spoke with the patient today and family is considering DNR status. Patient's sister, [name], is present in room and helps with history and answering questions. Pt has remained comfortable. WBC has increased to 22 today.    Scheduled Inpatient Medications:  . chlorhexidine gluconate (MEDLINE KIT)  15 mL Mouth Rinse BID  . insulin aspart  0-15 Units Subcutaneous Q4H  . lactulose  300 mL Rectal BID  . mouth rinse  15 mL Mouth Rinse 10 times per day  . [START ON 04/13/2018] pantoprazole  40 mg Intravenous Q12H  . sodium chloride flush  10-40 mL Intracatheter Q12H    Continuous Inpatient Infusions:   . sodium chloride 75 mL/hr at 04/10/18 1017  . cefTRIAXone (ROCEPHIN)  IV    . dexmedetomidine (PRECEDEX) IV infusion Stopped (04/10/18 0120)  . DOPamine Stopped (04/10/18 0332)  . metronidazole Stopped (04/10/18 0706)  . midazolam 3 mg/hr (04/10/18 1202)  . norepinephrine (LEVOPHED) Adult infusion 10 mcg/min (04/10/18 1202)  . octreotide  (SANDOSTATIN)    IV infusion 50 mcg/hr (04/10/18 1202)  . pantoprozole (PROTONIX) infusion 8 mg/hr (04/10/18 1202)  . sodium chloride 1,000 mL (04/10/18 1151)    PRN Inpatient Medications:  ipratropium-albuterol, midazolam, sodium chloride flush  Review of Systems: Unable to perform due to patient's  critical illness   Physical Examination: BP (!) 120/55   Pulse 64   Temp (!) 97.3 F (36.3 C) (Axillary)   Resp 20   Ht _0  (1.6 m)   Wt 71.9 kg   SpO2 96%   BMI 28.08 kg/m  Gen: Critically ill appearing female resting in hospital bed. Intubated and on vent.  HEENT: PEERLA, EOMI, Neck: supple, no JVD or thyromegaly Chest: Clear anterior lung fields bilaterally, ETT to vent CV: RRR, no m/g/c/r Abd: distended abdomen, +BS in all four quadrants; no HSM, guarding, ridigity, or rebound tenderness Ext: no edema, well perfused with 2+ pulses, Skin: no rash or lesions noted Lymph: no LAD  Data: Lab Results  Component Value Date   WBC 22.3 (H) 04/10/2018   HGB 8.7 (L) 04/10/2018   HCT 27.5 (L) 04/10/2018   MCV 89.0 04/10/2018   PLT 243 04/10/2018   Recent Labs  Lab 04/03/2018 2022 03/28/2018 2340 04/10/18 0448  HGB 10.0* 8.5* 8.7*   Lab Results  Component Value Date   NA 133 (L) 04/10/2018   K 4.8 04/10/2018   CL 104 04/10/2018   CO2 21 (L) 04/10/2018   BUN 35 (H) 04/10/2018   CREATININE 0.82 04/10/2018   Lab Results  Component Value Date   ALT 457 (H) 04/10/2018   AST 1,882 (H) 04/10/2018   GGT 110 (A) 09/26/2011   ALKPHOS 578 (H) 04/10/2018   BILITOT 7.6 (H) 04/10/2018   Recent Labs  Lab 03/31/2018 1636  APTT 31  INR 1.50   Assessment/Plan:  63 y/o Caucasian female with a PMH  of Hx of hepatitis C s/p viral treatment, liver cirrhosis with Stage IV HCC, ongoing alcohol use admitted yesterday for acute GI bleed s/t esophageal varices s/p banding  1. Upper GI bleed s/t acute esophageal variceal bleeding s/p banding 2. Acute respiratory failure s/t suspected aspiration pneumonia 3. Cirrhosis Child Pugh Class C  4. Stage IV HCC - not candidate for IR embolization  - Successful variceal banding yesterday 01/15 with no recurrent hematemesis or evidence of occult GI bleeding - Continue Octreotide and Protonix drip - AST/ALT increased today to 1882/457, alk phos  578, bilirubin 7.6. Likely all secondary to progressive chronic liver disease and HCC - Continue symptomatic management - Discussed plan of care with patient's sister [name] at bedside. I answered all of her questions to the best of my ability - OG/NG tube is contraindicated in setting up upper GI bleeding - Agree with Palliative care recommendations.  - Please call if bleeding returns    Please call with questions or concerns.   Octavia Bruckner, PA-C [place]

## 2018-04-10 NOTE — Anesthesia Postprocedure Evaluation (Signed)
Anesthesia Post Note  Patient: [name]  Procedure(s) Performed: ESOPHAGOGASTRODUODENOSCOPY (EGD) WITH PROPOFOL (N/A )  Patient location during evaluation: ICU Anesthesia Type: General Level of consciousness: patient remains intubated per anesthesia plan and obtunded/minimal responses Pain management: pain level controlled Vital Signs Assessment: post-procedure vital signs reviewed and stable Respiratory status: patient on ventilator - see flowsheet for VS Cardiovascular status: stable Anesthetic complications: no     Last Vitals:  Vitals:   04/10/18 0700 04/10/18 0808  BP: (!) 119/50   Pulse: 67   Resp: (!) 22   Temp:    SpO2: 96% 94%    Last Pain:  Vitals:   04/10/18 0640  TempSrc: Axillary  PainSc:                  Ricki Miller

## 2018-04-10 NOTE — Consult Note (Signed)
[place]  Telephone:(336) [phone] Fax:(336) [phone]  ID: [name] OB: [birth year]  MR#: [id number]  QQI#:[id number]  Patient Care Team: Jinny Sanders, MD as PCP - General (Family Medicine) Clent Jacks, RN as Registered Nurse Lloyd Huger, MD as Medical Oncologist (Medical Oncology)  CHIEF COMPLAINT: Multiorgan failure, hepatocellular carcinoma.  INTERVAL HISTORY: Patient is a 63 year old female with a history of hepatocellular carcinoma who was initially scheduled for ablation earlier this week.  She was noted to have worsening bilirubin and ablation was canceled.  Patient subsequently presented to the emergency room with hemoptysis and declining performance status.  Currently, patient is intubated and sedated and review of systems is unobtainable.  Patient sister who is at bedside reports she has been declining over the past several months with increasing confusion.  REVIEW OF SYSTEMS:   Review of Systems  Unable to perform ROS: Intubated    PAST MEDICAL HISTORY: Past Medical History:  Diagnosis Date  . Acquired hypothyroidism    thyroid goiter s/p thyroidectomy  . Allergy   . Bipolar 1 disorder ([place]) 1990s  . Chronic hepatitis C (HCC)    genotype 1a  . Compensated HCV cirrhosis ([place])   . Complication of anesthesia    states 15 yrs ago, hard time waking up, states cant breathe when coming out of anesthesia  . Complication of anesthesia    per patient heart stopped  . COPD (chronic obstructive pulmonary disease) ([place])   . Depression    states seen weekly at [place] [facility]  . GERD (gastroesophageal reflux disease)    Hpylori + and treated (2012)  . Hepatitis C   . History of alcohol abuse   . Insomnia   . Migraines   . Pneumonia   . PONV (postoperative nausea and vomiting)    "it all depends on what kind they give me"  . Portal hypertension ([place])   . Portal hypertensive gastropathy ([place]) 2013  . Smoker   . Stroke ([place])   .  TIA (transient ischemic attack)   . Varicose veins     PAST SURGICAL HISTORY: Past Surgical History:  Procedure Laterality Date  . APPENDECTOMY    . DIAGNOSTIC LAPAROSCOPY     15 yrs ago  . ELECTROMAGNETIC NAVIGATION BROCHOSCOPY N/A 08/15/2017   Procedure: ELECTROMAGNETIC NAVIGATION BRONCHOSCOPY;  Surgeon: Flora Lipps, MD;  Location: [facility] ORS;  Service: Cardiopulmonary;  Laterality: N/A;  . ESOPHAGOGASTRODUODENOSCOPY  10/2010   nl esophagus, HH, portal hypertensive gastropathy, erosive gastropathy, Hpylori + (Dr. Sydnee Levans)  . ESOPHAGOGASTRODUODENOSCOPY (EGD) WITH PROPOFOL N/A 04/12/2018   Procedure: ESOPHAGOGASTRODUODENOSCOPY (EGD) WITH PROPOFOL;  Surgeon: Toledo, Benay Pike, MD;  Location: [facility] ENDOSCOPY;  Service: Gastroenterology;  Laterality: N/A;  . foot surgery Bilateral 2017   Callus removal  . IR RADIOLOGIST EVAL & MGMT  02/19/2018  . left foot surgery  1990s  . LIVER BIOPSY    . THYROIDECTOMY  05/17/2011   Procedure: TOTAL THYROIDECTOMY;  Surgeon: Earnstine Regal, MD, benign goiter per path report    FAMILY HISTORY: Family History  Problem Relation Age of Onset  . Heart disease Mother        pacemaker  . Cancer Father        bladder/melanoma  . Diabetes Father   . Hypertension Father   . Melanoma Sister   . Breast cancer Neg Hx     ADVANCED DIRECTIVES (Y/N):  '@ADVDIR' @  HEALTH MAINTENANCE: Social History   Tobacco Use  . Smoking status: Current Some  Day Smoker    Packs/day: 0.25    Years: 40.00    Pack years: 10.00    Types: Cigarettes  . Smokeless tobacco: Former Systems developer    Types: Chew  . Tobacco comment: decreasing use 05/30/2011, taking chantix   Substance Use Topics  . Alcohol use: No  . Drug use: No     Colonoscopy:  PAP:  Bone density:  Lipid panel:  Allergies  Allergen Reactions  . Demerol Anaphylaxis  . Other Other (See Comments)    general anesthesia - heart stopped   . Penicillins Anaphylaxis, Hives and Itching    Has patient had a PCN  reaction causing immediate rash, facial/tongue/throat swelling, SOB or lightheadedness with hypotension: Yes Has patient had a PCN reaction causing severe rash involving mucus membranes or skin necrosis: Yes Has patient had a PCN reaction that required hospitalization: Yes Has patient had a PCN reaction occurring within the last 10 years: No If all of the above answers are "NO", then may proceed with Cephalosporin use.   Marland Kitchen Propofol Other (See Comments)    Allergic, caused her to code   . Latex Rash  . Oxycodone Hcl Rash    Current Facility-Administered Medications  Medication Dose Route Frequency Provider Last Rate Last Dose  . 0.9 %  sodium chloride infusion   Intravenous Continuous Nicholes Mango, MD   Stopped at 04/10/18 1351  . cefTRIAXone (ROCEPHIN) 1 g in sodium chloride 0.9 % 100 mL IVPB  1 g Intravenous Q24H Gouru, Aruna, MD 200 mL/hr at 04/10/18 1454 1 g at 04/10/18 1454  . chlorhexidine gluconate (MEDLINE KIT) (PERIDEX) 0.12 % solution 15 mL  15 mL Mouth Rinse BID Darel Hong D, NP   15 mL at 04/10/18 0748  . DOPamine (INTROPIN) 800 mg in dextrose 5 % 250 mL (3.2 mg/mL) infusion  0-20 mcg/kg/min Intravenous Titrated Bradly Bienenstock, NP   Stopped at 04/10/18 [phone]  . insulin aspart (novoLOG) injection 0-15 Units  0-15 Units Subcutaneous Q4H Darel Hong D, NP   2 Units at 04/10/18 1615  . ipratropium-albuterol (DUONEB) 0.5-2.5 (3) MG/3ML nebulizer solution 3 mL  3 mL Nebulization Q4H PRN Darel Hong D, NP      . lactulose [facility]) enema 200 gm  300 mL Rectal BID Darel Hong D, NP   300 mL at 04/10/18 4628  . MEDLINE mouth rinse  15 mL Mouth Rinse 10 times per day Darel Hong D, NP   15 mL at 04/10/18 1616  . metroNIDAZOLE (FLAGYL) IVPB 500 mg  500 mg Intravenous Q6H Darel Hong D, NP 100 mL/hr at 04/10/18 1400    . midazolam (VERSED) 50 mg in sodium chloride 0.9 % 50 mL (1 mg/mL) infusion  0.5-5 mg/hr Intravenous Titrated Darel Hong D, NP 3.5 mL/hr at  04/10/18 1400 3.5 mg/hr at 04/10/18 1400  . midazolam (VERSED) injection 2 mg  2 mg Intravenous Q2H PRN Bradly Bienenstock, NP   2 mg at 03/28/2018 2057  . norepinephrine (LEVOPHED) 16 mg in D5W 234m premix infusion  0-40 mcg/min Intravenous Titrated KDarel HongD, NP 7.5 mL/hr at 04/10/18 1400 8 mcg/min at 04/10/18 1400  . octreotide (SANDOSTATIN) 500 mcg in sodium chloride 0.9 % 250 mL (2 mcg/mL) infusion  50 mcg/hr Intravenous Continuous VAlfred Levins [place] MD 25 mL/hr at 04/10/18 1609 50 mcg/hr at 04/10/18 1609  . pantoprazole (PROTONIX) 80 mg in sodium chloride 0.9 % 250 mL (0.32 mg/mL) infusion  8 mg/hr Intravenous Continuous VAlfred Levins [place] MD 25 mL/hr at  04/10/18 1716 8 mg/hr at 04/10/18 1716  . [START ON 04/13/2018] pantoprazole (PROTONIX) injection 40 mg  40 mg Intravenous Q12H Veronese, [place], MD      . sodium chloride flush (NS) 0.9 % injection 10-40 mL  10-40 mL Intracatheter Q12H Kasa, Kurian, MD      . sodium chloride flush (NS) 0.9 % injection 10-40 mL  10-40 mL Intracatheter PRN Flora Lipps, MD        OBJECTIVE: Vitals:   04/10/18 1740 04/10/18 1750  BP: (!) 103/51 (!) 106/47  Pulse: 65 65  Resp: (!) 21 18  Temp:    SpO2: 95% 96%     Body mass index is 28.08 kg/m.    ECOG FS:4 - Bedbound  General: Intubated and sedated. HEENT: ET tube in place. Lungs: Clear to auscultation bilaterally. Heart: Regular rate and rhythm. No rubs, murmurs, or gallops. Abdomen: Soft, nontender, nondistended. No organomegaly noted, normoactive bowel sounds. Musculoskeletal: No edema, cyanosis, or clubbing. Neuro: Sedated. Skin: No rashes or petechiae noted.  LAB RESULTS:  Lab Results  Component Value Date   NA 133 (L) 04/10/2018   K 4.8 04/10/2018   CL 104 04/10/2018   CO2 21 (L) 04/10/2018   GLUCOSE 187 (H) 04/10/2018   BUN 35 (H) 04/10/2018   CREATININE 0.82 04/10/2018   CALCIUM 6.7 (L) 04/10/2018   PROT 6.2 (L) 04/10/2018   ALBUMIN 2.1 (L) 04/10/2018   AST 1,882 (H)  04/10/2018   ALT 457 (H) 04/10/2018   ALKPHOS 578 (H) 04/10/2018   BILITOT 7.6 (H) 04/10/2018   GFRNONAA >60 04/10/2018   GFRAA >60 04/10/2018    Lab Results  Component Value Date   WBC 22.3 (H) 04/10/2018   NEUTROABS 13.7 (H) 04/02/2018   HGB 8.6 (L) 04/10/2018   HCT 26.7 (L) 04/10/2018   MCV 89.0 04/10/2018   PLT 243 04/10/2018     STUDIES: Ct Abdomen Pelvis Wo Contrast  Result Date: 04/14/2018 CLINICAL DATA:  reports vomiting dark red blood, states four episodes today. Denies hx of same, denies blood thinners, denies alcohol use. Pt tachypneic, appears short of breath. Pt is a cancer patient, currently being treated for liver cancer. Pt abdomen very taut and distended. EXAM: CT ABDOMEN AND PELVIS WITHOUT CONTRAST TECHNIQUE: Multidetector CT imaging of the abdomen and pelvis was performed following the standard protocol without IV contrast. COMPARISON:  PET-CT, 01/31/2018 FINDINGS: Lower chest: No acute findings. Hepatobiliary: Liver shows extensive nodularity. There is enlargement of the inferior aspect of the right lobe and lateral segment of the left lobe. Subtle hypoattenuating areas are noted in the liver, poorly defined on this unenhanced study, consistent with the known multifocal hepatocellular carcinoma. Gallbladder is unremarkable.  No bile duct dilation. Pancreas: Unremarkable. No pancreatic ductal dilatation or surrounding inflammatory changes. Spleen: Spleen borderline enlarged measuring 12.3 cm in greatest dimension, stable from the prior PET-CT. No splenic mass or focal lesion. Adrenals/Urinary Tract: No adrenal masses. Kidneys normal size, orientation and position. No renal masses or stones. No hydronephrosis. Ureters normal in course and in caliber. Bladder is unremarkable. Stomach/Bowel: Small hiatal hernia. Stomach is otherwise unremarkable. Small bowel is normal in caliber with no wall thickening or adjacent inflammation. Colon is normal in caliber. No wall thickening or  convincing inflammation. Vascular/Lymphatic: There are numerous venous collaterals in the upper abdomen, with prominent paraesophageal varices. These are stable from the prior PET-CT. There are dense atherosclerotic calcifications along a normal caliber abdominal aorta. There are multiple prominent and enlarged upper abdominal lymph nodes  along the peri celiac and gastrohepatic ligaments, stable from the prior PET-CT. Reproductive: Uterus and bilateral adnexa are unremarkable. Other: There is now a small amount of ascites collecting adjacent to the liver, and tracking into the pelvis collecting in the pelvic recesses. Musculoskeletal: No fracture or acute finding. No osteoblastic or osteolytic lesions. IMPRESSION: 1. No acute findings within the abdomen or pelvis. 2. Advanced cirrhosis with portal venous hypertension. The multifocal hepatocellular carcinoma noted on the prior PET-CT is not well-defined on this unenhanced study, but shows no convincing change. 3. Prominent and enlarged upper abdominal lymph nodes, stable from the prior PET-CT. 4. Since the prior PET-CT, a small amount of ascites has developed. Electronically Signed   By: Lajean Manes M.D.   On: 03/30/2018 15:33   Dg Chest 1 View  Result Date: 04/10/2018 CLINICAL DATA:  Central line placement EXAM: CHEST  1 VIEW COMPARISON:  03/31/2018 at 1920 hours FINDINGS: Right IJ venous catheter terminates in the mid SVC. Endotracheal tube terminates 4 cm above the carina. Mild hazy lingular opacity, new. No pleural effusion or pneumothorax. The heart is normal in size. IMPRESSION: Right IJ venous catheter terminates in the mid SVC. Electronically Signed   By: Julian Hy M.D.   On: 04/10/2018 00:17   Dg Chest 2 View  Result Date: 03/21/2018 CLINICAL DATA:  Community acquired pneumonia. EXAM: CHEST - 2 VIEW COMPARISON:  Chest x-ray July 15, 2017, PET-CT January 31, 2018 FINDINGS: The heart size and mediastinal contours are within normal limits.  There is a 2 cm nodule in the left upper lobe described on previous PET-CT. There is no focal pneumonia, pulmonary edema, or pleural effusion. The visualized skeletal structures are unremarkable. IMPRESSION: 2 cm nodule in the left upper lobe described on the recent previous PET-CT. Neoplasm is not excluded. No focal pneumonia is identified. Electronically Signed   By: Abelardo Diesel M.D.   On: 03/21/2018 19:18   Dg Abd 1 View  Result Date: 03/21/2018 CLINICAL DATA:  Abdominal pain, distention, nausea, vomiting x3-4 months, worse x2-3 days. Hx of hepatitis, GERD, HTN. EXAM: ABDOMEN - 1 VIEW COMPARISON:  PET-CT on 01/31/2018 FINDINGS: Spleen is enlarged. Bowel gas pattern is nonobstructive. No suspicious lytic or blastic lesions are identified. IMPRESSION: Nonobstructed bowel-gas pattern. Splenomegaly. Electronically Signed   By: Nolon Nations M.D.   On: 03/21/2018 14:05   US Abdomen Limited  Result Date: 03/22/2018 CLINICAL DATA:  63 year old with history of hepatitis-C, cirrhosis and hepatocellular carcinoma. Evaluate for ascites and spontaneous bacterial peritonitis. EXAM: LIMITED ABDOMEN ULTRASOUND FOR ASCITES TECHNIQUE: Limited ultrasound survey for ascites was performed in all four abdominal quadrants. COMPARISON:  PET-CT 01/31/2018 FINDINGS: Spleen is prominent for size without surrounding fluid. There is no ascites identified in the left upper quadrant, left lower quadrant or right lower quadrant. Trace perihepatic ascites near the dome. Liver is heterogeneous and compatible with cirrhosis. IMPRESSION: No significant ascites. Trace ascites around the liver. Paracentesis not performed. Electronically Signed   By: Markus Daft M.D.   On: 03/22/2018 15:55   Portable Chest Xray  Result Date: 04/10/2018 CLINICAL DATA:  Endotracheal tube present EXAM: PORTABLE CHEST 1 VIEW COMPARISON:  Yesterday FINDINGS: Endotracheal tube tip between the clavicular heads and carina. Right IJ line with tip at the SVC.  Retrocardiac airspace disease. No edema, effusion, or pneumothorax. Normal heart size. IMPRESSION: 1. Stable hardware positioning. 2. Retrocardiac atelectasis or infection. Electronically Signed   By: Monte Fantasia M.D.   On: 04/10/2018 06:28   Portable Chest  X-ray  Result Date: 04/24/2018 CLINICAL DATA:  Evaluate endotracheal tube. EXAM: PORTABLE CHEST 1 VIEW COMPARISON:  03/21/2018 FINDINGS: Endotracheal tube tip projects approximately 4 mm above the Carina, pointed towards the right mainstem bronchus orifice. Cardiac silhouette is normal in size. No mediastinal or hilar masses. Lungs show chronically prominent bronchovascular markings. No evidence of pneumonia or pulmonary edema. No pleural effusion or pneumothorax. Multiple surgical vascular clips at the neck base consistent with a prior thyroidectomy. IMPRESSION: 1. Endotracheal tube tip projects low, only approximately 4 mm above the inferior margin of the Carina, angled towards the right mainstem bronchus. Consider retracting 1 to 1.5 cm. 2. No acute cardiopulmonary disease. Electronically Signed   By: Lajean Manes M.D.   On: 04/18/2018 19:39   Dg Abd 2 Views  Result Date: 04/08/2018 CLINICAL DATA:  63 year old with hepatic cirrhosis and hepatocellular carcinoma due to chronic hepatitis C presenting with constipation for approximately 3 weeks. EXAM: ABDOMEN - 2 VIEW COMPARISON:  03/21/2018 and earlier, including PET-CT 01/31/2018. FINDINGS: Bowel gas pattern unremarkable without evidence of obstruction or significant ileus. No evidence of air-fluid levels or free intraperitoneal air on the ERECT image. Expected stool burden in the colon. No visible urinary tract calculi. Splenomegaly, as the inferior tip of the spleen extends nearly to the pelvic brim. Degenerative changes involving the lumbar spine. IMPRESSION: 1. No acute abdominal abnormality. 2. Splenomegaly, as the inferior tip of the spleen extends nearly to the pelvic brim. Electronically  Signed   By: Evangeline Dakin M.D.   On: 04/08/2018 09:00    ASSESSMENT: Multiorgan failure, hepatocellular carcinoma.  PLAN:    1.  Multiorgan failure: Patient currently intubated and on pressors.  She is unresponsive.  She has continued decline of her liver function.  Likely secondary to decompensated cirrhosis from her hepatitis C.  Patient is now DNR and sisters are considering comfort care measures.  Appreciate palliative care input. 2.  Hepatocellular carcinoma: Although AFP is trending up, recent CT scan on April 09, 2018 did not reveal convincing evidence of any significant progression of disease.  Patient was unable to undergo ablation earlier this week.  If patient recovers, systemic treatment would be difficult given her multiple comorbidities. 3.  Leukocytosis: Possibly secondary to underlying sepsis.  Continue antibiotics as prescribed. 4.  Disposition: Patient is critically ill and is now DNR.  Patient's sisters are considering comfort care measures.  Appreciate consult, will follow.  Lloyd Huger, MD   04/10/2018 6:41 PM

## 2018-04-10 NOTE — Patient Outreach (Signed)
[facility]) Care Management  04/10/2018  [name] Apr 19, 1955 [id number]   Telephone Screen  Referral Date:04/07/2018 Referral Source:[employer] Report Referral Reason:unknown Insurance:[employer] Medicare  Per chart review, patient admitted to the hospital on 04/21/2018 for upper GI Bleed and remains inpatient.    Plan: RN CM will close case.  Enzo Montgomery, RN,BSN,CCM [place] Management Telephonic Care Management Coordinator Direct Phone: [phone] Toll Free: [phone] Fax: [phone]

## 2018-04-10 NOTE — Progress Notes (Signed)
CRITICAL CARE NOTE  CC  follow up respiratory failure  SUBJECTIVE Patient remains critically ill Prognosis is guarded On vent fio2 at 60%     SIGNIFICANT EVENTS 1/15 resp failure, GIB s/p banding 1/16 remains on vent, critically ill   BP (!) 118/59   Pulse 67   Temp 98.3 F (36.8 C) (Axillary)   Resp (!) 25   Ht _0  (1.6 m)   Wt 71.9 kg   SpO2 96%   BMI 28.08 kg/m    REVIEW OF SYSTEMS  PATIENT IS UNABLE TO PROVIDE COMPLETE REVIEW OF SYSTEMS DUE TO SEVERE CRITICAL ILLNESS   PHYSICAL EXAMINATION:  GENERAL:critically ill appearing, +resp distress HEAD: Normocephalic, atraumatic.  EYES: Pupils equal, round, reactive to light.  No scleral icterus.  MOUTH: Moist mucosal membrane. NECK: Supple. No thyromegaly. No nodules. No JVD.  PULMONARY: +rhonchi, +wheezing CARDIOVASCULAR: S1 and S2. Regular rate and rhythm. No murmurs, rubs, or gallops.  GASTROINTESTINAL: Soft, nontender, -distended. No masses. Positive bowel sounds. No hepatosplenomegaly.  MUSCULOSKELETAL: No swelling, clubbing, or edema.  NEUROLOGIC: obtunded, GCS<8 SKIN:intact,warm,dry      Indwelling Urinary Catheter continued, requirement due to   Reason to continue Indwelling Urinary Catheter for strict Intake/Output monitoring for hemodynamic instability   Central Line continued, requirement due to   Reason to continue [employer] Monitoring of central venous pressure or other hemodynamic parameters   Ventilator continued, requirement due to, resp failure    Ventilator Sedation RASS 0 to -2     ASSESSMENT AND PLAN SYNOPSIS   Severe Hypoxic and Hypercapnic Respiratory Failure from aspiration pneumonia/septic shock -continue Full MV support -continue Bronchodilator Therapy -Wean Fio2 and PEEP as tolerated -will perform SAT/SBT when respiratory parameters are met   NEUROLOGY - intubated and sedated - minimal sedation to achieve a RASS goal: -1   Septic shock -use vasopressors  to keep MAP>65 -follow ABG and LA -follow up cultures -emperic ABX -consider stress dose steroids -aggressive IV fluid resuscitation  CARDIAC ICU monitoring  ID -continue IV abx as prescibed -follow up cultures Aspiration pneumonia  GI/Nutrition GI PROPHYLAXIS as indicated DIET-->NPO Constipation protocol as indicated Acute Liver failure and cirrhosis GIB from varicies     ENDO - ICU hypoglycemic\Hyperglycemia protocol -check FSBS per protocol   ELECTROLYTES -follow labs as needed -replace as needed -pharmacy consultation and following   DVT/GI PRX ordered TRANSFUSIONS AS NEEDED MONITOR FSBS ASSESS the need for LABS as needed   Critical Care Time devoted to patient care services described in this note is 38 minutes.   Overall, patient is critically ill, prognosis is guarded.  Patient with Multiorgan failure and at high risk for cardiac arrest and death.   very poor prognosis Recommend DNR status Palliative care consnulted  Corrin Parker, M.D.  [facility]  Medical Director [place] Director [facility] Cardio-Pulmonary Department

## 2018-04-10 NOTE — Consult Note (Signed)
[place]  Telephone:([phone] [phone]   Name: [name] Date: 04/10/2018 MRN: [id number]  DOB: 1955-11-09  Patient Care Team: Jinny Sanders, MD as PCP - General (Family Medicine) Clent Jacks, RN as Registered Nurse Lloyd Huger, MD as Medical Oncologist (Medical Oncology)    REASON FOR CONSULTATION: Palliative Care consult requested for this 63 y.o. female with multiple medical problems including stage IV hepatocellular carcinoma, child Pugh class C cirrhosis, history of hepatitis C status post viral treatment, who was admitted to the hospital on 04/18/2018 with upper GI bleed.  Patient underwent EGD and was found to have grade 1 esophageal varices with successful banding.  Unfortunately patient was unable to be extubated following the procedure.  She is suspected to have aspiration pneumonia. Palliative care was consulted to help address goals.   SOCIAL HISTORY:     reports that she has been smoking cigarettes. She has a 10.00 pack-year smoking history. She has quit using smokeless tobacco.  Her smokeless tobacco use included chew. She reports that she does not drink alcohol or use drugs.   Patient lives at home alone. She was twice divorced. She has no children. She has two sister who are involved in her care. Patient's mother and father are also living. Patient worked at a [employer], community and then cared for her parents.   ADVANCE DIRECTIVES:  HCPOA and living will  CODE STATUS: Full code  PAST MEDICAL HISTORY: Past Medical History:  Diagnosis Date  . Acquired hypothyroidism    thyroid goiter s/p thyroidectomy  . Allergy   . Bipolar 1 disorder ([place]) 1990s  . Chronic hepatitis C (HCC)    genotype 1a  . Compensated HCV cirrhosis ([place])   . Complication of anesthesia    states 15 yrs ago, hard time waking up, states cant breathe when coming out of anesthesia  . Complication of anesthesia    per patient  heart stopped  . COPD (chronic obstructive pulmonary disease) ([place])   . Depression    states seen weekly at [facility]  . GERD (gastroesophageal reflux disease)    Hpylori + and treated (2012)  . Hepatitis C   . History of alcohol abuse   . Insomnia   . Migraines   . Pneumonia   . PONV (postoperative nausea and vomiting)    "it all depends on what kind they give me"  . Portal hypertension ([place])   . Portal hypertensive gastropathy ([place]) 2013  . Smoker   . Stroke ([place])   . TIA (transient ischemic attack)   . Varicose veins     PAST SURGICAL HISTORY:  Past Surgical History:  Procedure Laterality Date  . APPENDECTOMY    . DIAGNOSTIC LAPAROSCOPY     15 yrs ago  . ELECTROMAGNETIC NAVIGATION BROCHOSCOPY N/A 08/15/2017   Procedure: ELECTROMAGNETIC NAVIGATION BRONCHOSCOPY;  Surgeon: Flora Lipps, MD;  Location: [facility] ORS;  Service: Cardiopulmonary;  Laterality: N/A;  . ESOPHAGOGASTRODUODENOSCOPY  10/2010   nl esophagus, HH, portal hypertensive gastropathy, erosive gastropathy, Hpylori + (Dr. Sydnee Levans)  . foot surgery Bilateral 2017   Callus removal  . IR RADIOLOGIST EVAL & MGMT  02/19/2018  . left foot surgery  1990s  . LIVER BIOPSY    . THYROIDECTOMY  05/17/2011   Procedure: TOTAL THYROIDECTOMY;  Surgeon: Earnstine Regal, MD, benign goiter per path report    HEMATOLOGY/ONCOLOGY HISTORY:    Hepatocellular carcinoma ([place])   01/26/2018 Initial Diagnosis  Hepatocellular carcinoma ([place])    01/28/2018 Cancer Staging    Staging form: Liver, AJCC 8th Edition - Clinical stage from 01/28/2018: Stage IIIA (cT3, cN0, cM0) - Signed by Lloyd Huger, MD on 01/28/2018     ALLERGIES:  is allergic to demerol; other; penicillins; propofol; latex; and oxycodone hcl.  MEDICATIONS:  Current Facility-Administered Medications  Medication Dose Route Frequency Provider Last Rate Last Dose  . 0.9 %  sodium chloride infusion   Intravenous Continuous Gouru, Aruna, MD 75 mL/hr at  04/10/18 1017    . cefTRIAXone (ROCEPHIN) 1 g in sodium chloride 0.9 % 100 mL IVPB  1 g Intravenous Q24H Gouru, Aruna, MD      . chlorhexidine gluconate (MEDLINE KIT) (PERIDEX) 0.12 % solution 15 mL  15 mL Mouth Rinse BID Darel Hong D, NP   15 mL at 04/10/18 0748  . dexmedetomidine (PRECEDEX) 400 MCG/100ML (4 mcg/mL) infusion  0-1.2 mcg/kg/hr Intravenous Continuous Bradly Bienenstock, NP   Stopped at 04/10/18 0120  . DOPamine (INTROPIN) 800 mg in dextrose 5 % 250 mL (3.2 mg/mL) infusion  0-20 mcg/kg/min Intravenous Titrated Bradly Bienenstock, NP   Stopped at 04/10/18 [phone]  . insulin aspart (novoLOG) injection 0-15 Units  0-15 Units Subcutaneous Q4H Darel Hong D, NP   3 Units at 04/10/18 0606  . ipratropium-albuterol (DUONEB) 0.5-2.5 (3) MG/3ML nebulizer solution 3 mL  3 mL Nebulization Q4H PRN Darel Hong D, NP      . lactulose [facility]) enema 200 gm  300 mL Rectal BID Darel Hong D, NP   300 mL at 04/10/18 1856  . MEDLINE mouth rinse  15 mL Mouth Rinse 10 times per day Darel Hong D, NP   15 mL at 04/10/18 1137  . metroNIDAZOLE (FLAGYL) IVPB 500 mg  500 mg Intravenous Q6H Bradly Bienenstock, NP   Stopped at 04/10/18 [phone]  . midazolam (VERSED) 50 mg in sodium chloride 0.9 % 50 mL (1 mg/mL) infusion  0.5-5 mg/hr Intravenous Titrated Darel Hong D, NP 3 mL/hr at 04/10/18 1017 3 mg/hr at 04/10/18 1017  . midazolam (VERSED) injection 2 mg  2 mg Intravenous Q2H PRN Bradly Bienenstock, NP   2 mg at 04/23/2018 2057  . norepinephrine (LEVOPHED) 16 mg in D5W 227m premix infusion  0-40 mcg/min Intravenous Titrated KDarel HongD, NP 13.13 mL/hr at 04/10/18 1017 14 mcg/min at 04/10/18 1017  . octreotide (SANDOSTATIN) 500 mcg in sodium chloride 0.9 % 250 mL (2 mcg/mL) infusion  50 mcg/hr Intravenous Continuous VAlfred Levins CKent[place] MD 25 mL/hr at 04/10/18 1017 50 mcg/hr at 04/10/18 1017  . pantoprazole (PROTONIX) 80 mg in sodium chloride 0.9 % 250 mL (0.32 mg/mL) infusion  8 mg/hr  Intravenous Continuous VAlfred Levins [place] MD 25 mL/hr at 04/10/18 1017 8 mg/hr at 04/10/18 1017  . [START ON 04/13/2018] pantoprazole (PROTONIX) injection 40 mg  40 mg Intravenous Q12H Veronese, CKent[place] MD      . sodium chloride 0.9 % bolus 1,000 mL  1,000 mL Intravenous Once BAwilda Bill NP      . sodium chloride flush (NS) 0.9 % injection 10-40 mL  10-40 mL Intracatheter Q12H Kasa, Kurian, MD      . sodium chloride flush (NS) 0.9 % injection 10-40 mL  10-40 mL Intracatheter PRN KFlora Lipps MD        VITAL SIGNS: BP (!) 118/52   Pulse 65   Temp 98.5 F (36.9 C) (Axillary)   Resp (!) 25   Ht '5\' 3"'  (1.6  m)   Wt 158 lb 8.2 oz (71.9 kg)   SpO2 97%   BMI 28.08 kg/m  Filed Weights   04/08/2018 1410 04/13/2018 1754 04/04/2018 1930  Weight: 158 lb (71.7 kg) 158 lb (71.7 kg) 158 lb 8.2 oz (71.9 kg)    Estimated body mass index is 28.08 kg/m as calculated from the following:   Height as of this encounter: '5\' 3"'  (1.6 m).   Weight as of this encounter: 158 lb 8.2 oz (71.9 kg).  LABS: CBC:    Component Value Date/Time   WBC 22.3 (H) 04/10/2018 0448   HGB 8.7 (L) 04/10/2018 0448   HCT 27.5 (L) 04/10/2018 0448   PLT 243 04/10/2018 0448   MCV 89.0 04/10/2018 0448   NEUTROABS 13.7 (H) 04/13/2018 1415   LYMPHSABS 1.2 04/02/2018 1415   MONOABS 3.0 (H) 04/15/2018 1415   EOSABS 0.0 04/21/2018 1415   BASOSABS 0.1 04/23/2018 1415   Comprehensive Metabolic Panel:    Component Value Date/Time   NA 133 (L) 04/10/2018 0448   NA 142 09/26/2011   K 4.8 04/10/2018 0448   CL 104 04/10/2018 0448   CO2 21 (L) 04/10/2018 0448   BUN 35 (H) 04/10/2018 0448   BUN 11 09/26/2011   CREATININE 0.82 04/10/2018 0448   CREATININE 0.65 01/27/2015 1719   GLUCOSE 187 (H) 04/10/2018 0448   CALCIUM 6.7 (L) 04/10/2018 0448   AST 1,882 (H) 04/10/2018 0448   AST 137 09/26/2011   ALT 457 (H) 04/10/2018 0448   ALKPHOS 578 (H) 04/10/2018 0448   ALKPHOS 74 09/26/2011   BILITOT 7.6 (H) 04/10/2018 0448    BILITOT 1.2 09/26/2011   PROT 6.2 (L) 04/10/2018 0448   PROT 7.8 05/22/2017 1054   ALBUMIN 2.1 (L) 04/10/2018 0448   ALBUMIN 4.1 09/26/2011    RADIOGRAPHIC STUDIES: Ct Abdomen Pelvis Wo Contrast  Result Date: 03/28/2018 CLINICAL DATA:  reports vomiting dark red blood, states four episodes today. Denies hx of same, denies blood thinners, denies alcohol use. Pt tachypneic, appears short of breath. Pt is a cancer patient, currently being treated for liver cancer. Pt abdomen very taut and distended. EXAM: CT ABDOMEN AND PELVIS WITHOUT CONTRAST TECHNIQUE: Multidetector CT imaging of the abdomen and pelvis was performed following the standard protocol without IV contrast. COMPARISON:  PET-CT, 01/31/2018 FINDINGS: Lower chest: No acute findings. Hepatobiliary: Liver shows extensive nodularity. There is enlargement of the inferior aspect of the right lobe and lateral segment of the left lobe. Subtle hypoattenuating areas are noted in the liver, poorly defined on this unenhanced study, consistent with the known multifocal hepatocellular carcinoma. Gallbladder is unremarkable.  No bile duct dilation. Pancreas: Unremarkable. No pancreatic ductal dilatation or surrounding inflammatory changes. Spleen: Spleen borderline enlarged measuring 12.3 cm in greatest dimension, stable from the prior PET-CT. No splenic mass or focal lesion. Adrenals/Urinary Tract: No adrenal masses. Kidneys normal size, orientation and position. No renal masses or stones. No hydronephrosis. Ureters normal in course and in caliber. Bladder is unremarkable. Stomach/Bowel: Small hiatal hernia. Stomach is otherwise unremarkable. Small bowel is normal in caliber with no wall thickening or adjacent inflammation. Colon is normal in caliber. No wall thickening or convincing inflammation. Vascular/Lymphatic: There are numerous venous collaterals in the upper abdomen, with prominent paraesophageal varices. These are stable from the prior PET-CT. There are  dense atherosclerotic calcifications along a normal caliber abdominal aorta. There are multiple prominent and enlarged upper abdominal lymph nodes along the peri celiac and gastrohepatic ligaments, stable from the prior PET-CT.  Reproductive: Uterus and bilateral adnexa are unremarkable. Other: There is now a small amount of ascites collecting adjacent to the liver, and tracking into the pelvis collecting in the pelvic recesses. Musculoskeletal: No fracture or acute finding. No osteoblastic or osteolytic lesions. IMPRESSION: 1. No acute findings within the abdomen or pelvis. 2. Advanced cirrhosis with portal venous hypertension. The multifocal hepatocellular carcinoma noted on the prior PET-CT is not well-defined on this unenhanced study, but shows no convincing change. 3. Prominent and enlarged upper abdominal lymph nodes, stable from the prior PET-CT. 4. Since the prior PET-CT, a small amount of ascites has developed. Electronically Signed   By: Lajean Manes M.D.   On: 04/03/2018 15:33   Dg Chest 1 View  Result Date: 04/10/2018 CLINICAL DATA:  Central line placement EXAM: CHEST  1 VIEW COMPARISON:  04/08/2018 at 1920 hours FINDINGS: Right IJ venous catheter terminates in the mid SVC. Endotracheal tube terminates 4 cm above the carina. Mild hazy lingular opacity, new. No pleural effusion or pneumothorax. The heart is normal in size. IMPRESSION: Right IJ venous catheter terminates in the mid SVC. Electronically Signed   By: Julian Hy M.D.   On: 04/10/2018 00:17   Dg Chest 2 View  Result Date: 03/21/2018 CLINICAL DATA:  Community acquired pneumonia. EXAM: CHEST - 2 VIEW COMPARISON:  Chest x-ray July 15, 2017, PET-CT January 31, 2018 FINDINGS: The heart size and mediastinal contours are within normal limits. There is a 2 cm nodule in the left upper lobe described on previous PET-CT. There is no focal pneumonia, pulmonary edema, or pleural effusion. The visualized skeletal structures are unremarkable.  IMPRESSION: 2 cm nodule in the left upper lobe described on the recent previous PET-CT. Neoplasm is not excluded. No focal pneumonia is identified. Electronically Signed   By: Abelardo Diesel M.D.   On: 03/21/2018 19:18   Dg Abd 1 View  Result Date: 03/21/2018 CLINICAL DATA:  Abdominal pain, distention, nausea, vomiting x3-4 months, worse x2-3 days. Hx of hepatitis, GERD, HTN. EXAM: ABDOMEN - 1 VIEW COMPARISON:  PET-CT on 01/31/2018 FINDINGS: Spleen is enlarged. Bowel gas pattern is nonobstructive. No suspicious lytic or blastic lesions are identified. IMPRESSION: Nonobstructed bowel-gas pattern. Splenomegaly. Electronically Signed   By: Nolon Nations M.D.   On: 03/21/2018 14:05   US Abdomen Limited  Result Date: 03/22/2018 CLINICAL DATA:  63 year old with history of hepatitis-C, cirrhosis and hepatocellular carcinoma. Evaluate for ascites and spontaneous bacterial peritonitis. EXAM: LIMITED ABDOMEN ULTRASOUND FOR ASCITES TECHNIQUE: Limited ultrasound survey for ascites was performed in all four abdominal quadrants. COMPARISON:  PET-CT 01/31/2018 FINDINGS: Spleen is prominent for size without surrounding fluid. There is no ascites identified in the left upper quadrant, left lower quadrant or right lower quadrant. Trace perihepatic ascites near the dome. Liver is heterogeneous and compatible with cirrhosis. IMPRESSION: No significant ascites. Trace ascites around the liver. Paracentesis not performed. Electronically Signed   By: Markus Daft M.D.   On: 03/22/2018 15:55   Portable Chest Xray  Result Date: 04/10/2018 CLINICAL DATA:  Endotracheal tube present EXAM: PORTABLE CHEST 1 VIEW COMPARISON:  Yesterday FINDINGS: Endotracheal tube tip between the clavicular heads and carina. Right IJ line with tip at the SVC. Retrocardiac airspace disease. No edema, effusion, or pneumothorax. Normal heart size. IMPRESSION: 1. Stable hardware positioning. 2. Retrocardiac atelectasis or infection. Electronically Signed    By: Monte Fantasia M.D.   On: 04/10/2018 06:28   Portable Chest X-ray  Result Date: 04/06/2018 CLINICAL DATA:  Evaluate endotracheal tube. EXAM:  PORTABLE CHEST 1 VIEW COMPARISON:  03/21/2018 FINDINGS: Endotracheal tube tip projects approximately 4 mm above the Carina, pointed towards the right mainstem bronchus orifice. Cardiac silhouette is normal in size. No mediastinal or hilar masses. Lungs show chronically prominent bronchovascular markings. No evidence of pneumonia or pulmonary edema. No pleural effusion or pneumothorax. Multiple surgical vascular clips at the neck base consistent with a prior thyroidectomy. IMPRESSION: 1. Endotracheal tube tip projects low, only approximately 4 mm above the inferior margin of the Carina, angled towards the right mainstem bronchus. Consider retracting 1 to 1.5 cm. 2. No acute cardiopulmonary disease. Electronically Signed   By: Lajean Manes M.D.   On: 04/07/2018 19:39   Dg Abd 2 Views  Result Date: 04/08/2018 CLINICAL DATA:  63 year old with hepatic cirrhosis and hepatocellular carcinoma due to chronic hepatitis C presenting with constipation for approximately 3 weeks. EXAM: ABDOMEN - 2 VIEW COMPARISON:  03/21/2018 and earlier, including PET-CT 01/31/2018. FINDINGS: Bowel gas pattern unremarkable without evidence of obstruction or significant ileus. No evidence of air-fluid levels or free intraperitoneal air on the ERECT image. Expected stool burden in the colon. No visible urinary tract calculi. Splenomegaly, as the inferior tip of the spleen extends nearly to the pelvic brim. Degenerative changes involving the lumbar spine. IMPRESSION: 1. No acute abdominal abnormality. 2. Splenomegaly, as the inferior tip of the spleen extends nearly to the pelvic brim. Electronically Signed   By: Evangeline Dakin M.D.   On: 04/08/2018 09:00    PERFORMANCE STATUS (ECOG) : 4 - Bedbound  Review of Systems Unable to complete  Physical Exam General: critically ill  appearing Cardiovascular: regular rate and rhythm Pulmonary: clear ant fields, ETT to vent Abdomen: distended GU: Foley Extremities: LE edema Skin: no rashes Neurological: sedated  IMPRESSION: I met with patient's sister, Loletha Carrow, who is her HCPOA.  Together we discussed patient's current medical problems.  Sister says she recognizes that patient is critically ill and may not survive this hospitalization.  She says she has been preparing herself recently that patient was likely approaching end-of-life.  She says patient has slowly declined over the past weeks.  At baseline, patient still lived at home alone but was struggling more with completing activities of daily living.  Patient was previously treated with chemotherapy but did not tolerate.  Most recently, she was being worked up for Y 90 but could not have the procedure due to increased bilirubin.  Patient is felt not likely to be a good candidate for future treatment.  I had a long talk with sister about the option of hospice care if she survives this event.  Sister says her primary goal is to ensure patient is comfortable and that patient "does not suffer".  We talked about the continuation of current treatment with the possibility of a one-way extubation when/if she stabilizes.  We also talked about the option of comfort care if it is apparent that she is not doing well.  We discussed CODE STATUS.  Sister says she does not think that family would want her to be resuscitated but she feels the need to speak with other family members prior to making that decision.  She will let the nursing staff know if family opted to change patient's CODE STATUS..  Case discussed with Dr. Grayland Ormond, Dr. Mortimer Fries, and nursing staff.   PLAN: Continue current medical treatment Family considering DNR Will follow   Time Total: 60 minutes  Visit consisted of counseling and education dealing with the complex and emotionally intense issues  of symptom management  and palliative care in the setting of serious and potentially life-threatening illness.Greater than 50%  of this time was spent counseling and coordinating care related to the above assessment and plan.  Signed by: Altha Harm, PhD, NP-C [phone] (Work Cell)

## 2018-04-10 NOTE — Plan of Care (Signed)
  Problem: Respiratory Goal: Patient will achieve/maintain normal respiratory rate/effort Outcome: Progressing

## 2018-04-11 ENCOUNTER — Ambulatory Visit: Payer: Medicare HMO | Admitting: Oncology

## 2018-04-11 ENCOUNTER — Inpatient Hospital Stay: Payer: Medicare HMO

## 2018-04-11 ENCOUNTER — Other Ambulatory Visit: Payer: Medicare HMO

## 2018-04-11 DIAGNOSIS — E440 Moderate protein-calorie malnutrition: Secondary | ICD-10-CM

## 2018-04-11 LAB — CBC
HCT: 27 % — ABNORMAL LOW (ref 36.0–46.0)
Hemoglobin: 8.5 g/dL — ABNORMAL LOW (ref 12.0–15.0)
MCH: 28.7 pg (ref 26.0–34.0)
MCHC: 31.5 g/dL (ref 30.0–36.0)
MCV: 91.2 fL (ref 80.0–100.0)
Platelets: 209 10*3/uL (ref 150–400)
RBC: 2.96 MIL/uL — ABNORMAL LOW (ref 3.87–5.11)
RDW: 20.2 % — ABNORMAL HIGH (ref 11.5–15.5)
WBC: 25.8 10*3/uL — ABNORMAL HIGH (ref 4.0–10.5)
nRBC: 0.1 % (ref 0.0–0.2)

## 2018-04-11 LAB — URINE CULTURE
Culture: NO GROWTH
Special Requests: NORMAL

## 2018-04-11 LAB — HEPATIC FUNCTION PANEL
ALT: 456 U/L — AB (ref 0–44)
AST: 1270 U/L — ABNORMAL HIGH (ref 15–41)
Albumin: 1.9 g/dL — ABNORMAL LOW (ref 3.5–5.0)
Alkaline Phosphatase: 695 U/L — ABNORMAL HIGH (ref 38–126)
Bilirubin, Direct: 3 mg/dL — ABNORMAL HIGH (ref 0.0–0.2)
Indirect Bilirubin: 2.3 mg/dL — ABNORMAL HIGH (ref 0.3–0.9)
Total Bilirubin: 5.3 mg/dL — ABNORMAL HIGH (ref 0.3–1.2)
Total Protein: 5.7 g/dL — ABNORMAL LOW (ref 6.5–8.1)

## 2018-04-11 LAB — GLUCOSE, CAPILLARY
Glucose-Capillary: 126 mg/dL — ABNORMAL HIGH (ref 70–99)
Glucose-Capillary: 130 mg/dL — ABNORMAL HIGH (ref 70–99)
Glucose-Capillary: 127 mg/dL — ABNORMAL HIGH (ref 70–99)
Glucose-Capillary: 129 mg/dL — ABNORMAL HIGH (ref 70–99)
Glucose-Capillary: 132 mg/dL — ABNORMAL HIGH (ref 70–99)
Glucose-Capillary: 133 mg/dL — ABNORMAL HIGH (ref 70–99)

## 2018-04-11 LAB — BASIC METABOLIC PANEL
Anion gap: 5 (ref 5–15)
BUN: 47 mg/dL — ABNORMAL HIGH (ref 8–23)
CO2: 20 mmol/L — ABNORMAL LOW (ref 22–32)
Calcium: 5.9 mg/dL — CL (ref 8.9–10.3)
Chloride: 111 mmol/L (ref 98–111)
Creatinine, Ser: 1.16 mg/dL — ABNORMAL HIGH (ref 0.44–1.00)
GFR calc Af Amer: 58 mL/min — ABNORMAL LOW (ref >60)
GFR, EST NON AFRICAN AMERICAN: 50 mL/min — AB (ref >60)
GLUCOSE: 142 mg/dL — AB (ref 70–99)
Potassium: 4.6 mmol/L (ref 3.5–5.1)
Sodium: 136 mmol/L (ref 135–145)

## 2018-04-11 LAB — AMMONIA: Ammonia: 103 umol/L — ABNORMAL HIGH (ref 9–35)

## 2018-04-11 LAB — PROCALCITONIN: Procalcitonin: 0.74 ng/mL

## 2018-04-11 MED ORDER — LEVOTHYROXINE SODIUM 88 MCG PO TABS
88.0000 ug | ORAL_TABLET | Freq: Every day | ORAL | Status: DC
  Filled 2018-04-11 (×3): qty 1

## 2018-04-11 NOTE — Progress Notes (Signed)
[facility] [place] NAME: [name]    MR#:  [id number]  DATE OF BIRTH:  01-25-1956  SUBJECTIVE:  CHIEF COMPLAINT:   Chief Complaint  Patient presents with  . GI Bleeding   -Intubated and sedated.  Remains critically ill.  No further change.  REVIEW OF SYSTEMS:  Review of Systems  Unable to perform ROS: Critical illness    DRUG ALLERGIES:   Allergies  Allergen Reactions  . Demerol Anaphylaxis  . Other Other (See Comments)    general anesthesia - heart stopped   . Penicillins Anaphylaxis, Hives and Itching    Has patient had a PCN reaction causing immediate rash, facial/tongue/throat swelling, SOB or lightheadedness with hypotension: Yes Has patient had a PCN reaction causing severe rash involving mucus membranes or skin necrosis: Yes Has patient had a PCN reaction that required hospitalization: Yes Has patient had a PCN reaction occurring within the last 10 years: No If all of the above answers are "NO", then may proceed with Cephalosporin use.   Marland Kitchen Propofol Other (See Comments)    Allergic, caused her to code   . Latex Rash  . Oxycodone Hcl Rash    VITALS:  Blood pressure (!) 104/51, pulse 79, temperature (!) 97.1 F (36.2 C), temperature source Axillary, resp. rate (!) 21, height 5\' 3"  (1.6 m), weight 71.9 kg, SpO2 95 %.  PHYSICAL EXAMINATION:  Physical Exam  GENERAL:  63 y.o.-year-old patient lying in the bed, intubated and on vent.  Critically ill-appearing EYES: Pupils equal, round, reactive to light and accommodation.  Positive for scleral icterus. Extraocular muscles intact.  HEENT: Head atraumatic, normocephalic. Oropharynx and nasopharynx clear.  NECK:  Supple, no jugular venous distention. No thyroid enlargement, no tenderness.  LUNGS: Normal breath sounds bilaterally, no wheezing, rales,rhonchi or crepitation. No use of accessory muscles of respiration.  Decreased bibasilar breath sounds  noted CARDIOVASCULAR: S1, S2 normal. No  rubs, or gallops. 2/6 systolic murmur present ABDOMEN: Soft, distended, hypoactive Bowel sounds present. No organomegaly or mass.  EXTREMITIES: No pedal edema, cyanosis, or clubbing.  NEUROLOGIC: Intubated and sedated.  Withdrawing to pain PSYCHIATRIC: The patient is sedated SKIN: No obvious rash, lesion, or ulcer.  Telangiectasias noted on the chest.  Palmar erythema present   LABORATORY PANEL:   CBC Recent Labs  Lab 04/11/18 0517  WBC 25.8*  HGB 8.5*  HCT 27.0*  PLT 209   ------------------------------------------------------------------------------------------------------------------  Chemistries  Recent Labs  Lab 03/31/2018 2340  04/11/18 0517  NA  --    < > 136  K 5.6*   < > 4.6  CL  --    < > 111  CO2  --    < > 20*  GLUCOSE  --    < > 142*  BUN  --    < > 47*  CREATININE  --    < > 1.16*  CALCIUM  --    < > 5.9*  MG 1.9  --   --   AST  --    < > 1,270*  ALT  --    < > 456*  ALKPHOS  --    < > 695*  BILITOT  --    < > 5.3*   < > = values in this interval not displayed.   ------------------------------------------------------------------------------------------------------------------  Cardiac Enzymes Recent Labs  Lab 03/28/2018 2340  TROPONINI <0.03   ------------------------------------------------------------------------------------------------------------------  RADIOLOGY:  Ct Abdomen Pelvis Wo Contrast  Result Date: 03/27/2018  CLINICAL DATA:  reports vomiting dark red blood, states four episodes today. Denies hx of same, denies blood thinners, denies alcohol use. Pt tachypneic, appears short of breath. Pt is a cancer patient, currently being treated for liver cancer. Pt abdomen very taut and distended. EXAM: CT ABDOMEN AND PELVIS WITHOUT CONTRAST TECHNIQUE: Multidetector CT imaging of the abdomen and pelvis was performed following the standard protocol without IV contrast. COMPARISON:  PET-CT, 01/31/2018 FINDINGS:  Lower chest: No acute findings. Hepatobiliary: Liver shows extensive nodularity. There is enlargement of the inferior aspect of the right lobe and lateral segment of the left lobe. Subtle hypoattenuating areas are noted in the liver, poorly defined on this unenhanced study, consistent with the known multifocal hepatocellular carcinoma. Gallbladder is unremarkable.  No bile duct dilation. Pancreas: Unremarkable. No pancreatic ductal dilatation or surrounding inflammatory changes. Spleen: Spleen borderline enlarged measuring 12.3 cm in greatest dimension, stable from the prior PET-CT. No splenic mass or focal lesion. Adrenals/Urinary Tract: No adrenal masses. Kidneys normal size, orientation and position. No renal masses or stones. No hydronephrosis. Ureters normal in course and in caliber. Bladder is unremarkable. Stomach/Bowel: Small hiatal hernia. Stomach is otherwise unremarkable. Small bowel is normal in caliber with no wall thickening or adjacent inflammation. Colon is normal in caliber. No wall thickening or convincing inflammation. Vascular/Lymphatic: There are numerous venous collaterals in the upper abdomen, with prominent paraesophageal varices. These are stable from the prior PET-CT. There are dense atherosclerotic calcifications along a normal caliber abdominal aorta. There are multiple prominent and enlarged upper abdominal lymph nodes along the peri celiac and gastrohepatic ligaments, stable from the prior PET-CT. Reproductive: Uterus and bilateral adnexa are unremarkable. Other: There is now a small amount of ascites collecting adjacent to the liver, and tracking into the pelvis collecting in the pelvic recesses. Musculoskeletal: No fracture or acute finding. No osteoblastic or osteolytic lesions. IMPRESSION: 1. No acute findings within the abdomen or pelvis. 2. Advanced cirrhosis with portal venous hypertension. The multifocal hepatocellular carcinoma noted on the prior PET-CT is not well-defined on  this unenhanced study, but shows no convincing change. 3. Prominent and enlarged upper abdominal lymph nodes, stable from the prior PET-CT. 4. Since the prior PET-CT, a small amount of ascites has developed. Electronically Signed   By: Lajean Manes M.D.   On: 04/08/2018 15:33   Dg Chest 1 View  Result Date: 04/10/2018 CLINICAL DATA:  Central line placement EXAM: CHEST  1 VIEW COMPARISON:  04/15/2018 at 1920 hours FINDINGS: Right IJ venous catheter terminates in the mid SVC. Endotracheal tube terminates 4 cm above the carina. Mild hazy lingular opacity, new. No pleural effusion or pneumothorax. The heart is normal in size. IMPRESSION: Right IJ venous catheter terminates in the mid SVC. Electronically Signed   By: Julian Hy M.D.   On: 04/10/2018 00:17   Dg Chest Port 1 View  Result Date: 04/11/2018 CLINICAL DATA:  Acute respiratory failure EXAM: PORTABLE CHEST 1 VIEW COMPARISON:  04/10/2018 FINDINGS: Endotracheal tube terminates 4 cm above the carina. Right IJ venous catheter terminates in the lower SVC. Mild left lower lobe opacity, likely atelectasis. Right lung is clear. No pleural effusion or pneumothorax. The heart is normal in size. Surgical clips along the bilateral neck. IMPRESSION: Endotracheal tube terminates 4 cm above the carina. Mild left lower lobe opacity, likely atelectasis. Electronically Signed   By: Julian Hy M.D.   On: 04/11/2018 03:36   Portable Chest Xray  Result Date: 04/10/2018 CLINICAL DATA:  Endotracheal tube present EXAM:  PORTABLE CHEST 1 VIEW COMPARISON:  Yesterday FINDINGS: Endotracheal tube tip between the clavicular heads and carina. Right IJ line with tip at the SVC. Retrocardiac airspace disease. No edema, effusion, or pneumothorax. Normal heart size. IMPRESSION: 1. Stable hardware positioning. 2. Retrocardiac atelectasis or infection. Electronically Signed   By: Monte Fantasia M.D.   On: 04/10/2018 06:28   Portable Chest X-ray  Result Date:  04/15/2018 CLINICAL DATA:  Evaluate endotracheal tube. EXAM: PORTABLE CHEST 1 VIEW COMPARISON:  03/21/2018 FINDINGS: Endotracheal tube tip projects approximately 4 mm above the Carina, pointed towards the right mainstem bronchus orifice. Cardiac silhouette is normal in size. No mediastinal or hilar masses. Lungs show chronically prominent bronchovascular markings. No evidence of pneumonia or pulmonary edema. No pleural effusion or pneumothorax. Multiple surgical vascular clips at the neck base consistent with a prior thyroidectomy. IMPRESSION: 1. Endotracheal tube tip projects low, only approximately 4 mm above the inferior margin of the Carina, angled towards the right mainstem bronchus. Consider retracting 1 to 1.5 cm. 2. No acute cardiopulmonary disease. Electronically Signed   By: Lajean Manes M.D.   On: 04/15/2018 19:39    EKG:   Orders placed or performed during the hospital encounter of 03/27/2018  . ED EKG  . ED EKG  . EKG 12-Lead  . EKG 12-Lead    ASSESSMENT AND PLAN:   63 year old female with past medical history significant for hepatitis C, liver cirrhosis, stage IV hepatocellular carcinoma, history of esophageal varices, bipolar, ongoing alcohol use presents to hospital secondary to hematemesis.  1.  Hematemesis-GI bleed, seen by GI and patient had EGD on admission.  Showing actively bleeding esophageal varices that were banded and bleeding was stopped. -Continue to keep n.p.o., remains on octreotide and Protonix drip -Appreciate GI consult -Hemoglobin is stable.  2.  Acute respiratory failure-hypoxic respiratory failure secondary to aspiration pneumonia -Management per ICU team -Continue to wean FiO2 currently on 40%, spontaneous breathing trial when tolerated.  Currently still on sedation -Continue IV antibiotics-Rocephin and Flagyl  3.  Septic shock-secondary to aspiration pneumonia -Follow-up echocardiogram -On low-dose Levophed.  Also on IV antibiotics which will be  continued  4.  Acute alcoholic liver failure-continue to monitor LFTs.  Has underlying hepatocellular carcinoma, ongoing alcohol use and alcoholic liver cirrhosis.  5.  DVT prophylaxis-teds and SCDs for now  6.  Hepatocellular carcinoma-appreciate oncology input.  Due to elevated AFP and even if patient survives this hospitalization, not a candidate for further ablation due to multiple comorbidities at this time. -Overall poor prognosis   Critically ill-appearing.  High risk for cardiorespiratory arrest Overall poor prognosis.  Appreciate palliative care consult. Family changed her to DNR at this time.   -Possible comfort care recommended  All the records are reviewed and case discussed with Care Management/Social Workerr. Management plans discussed with the patient, family and they are in agreement.  CODE STATUS: Full code  TOTAL TIME TAKING CARE OF THIS PATIENT: 35 minutes.   POSSIBLE D/C IN ?  DAYS, DEPENDING ON CLINICAL CONDITION.   Gladstone Lighter M.D on 04/11/2018 at 10:55 AM  Between 7am to 6pm - Pager - [phone]  After 6pm go to [email or web address] - password [facility]  Office  [phone]  CC: Primary care physician; Jinny Sanders, MD

## 2018-04-11 NOTE — Progress Notes (Signed)
CRITICAL CARE NOTE  CC/subjective  follow up respiratory failure metastatic liver cancer  On ventilator,sedated Patient remains critically ill Prognosis is guarded     SIGNIFICANT EVENTS Low urine output    BP (!) 120/53   Pulse 91   Temp 97.6 F (36.4 C) (Oral)   Resp (!) 26   Ht _0  (1.6 m)   Wt 71.9 kg   SpO2 95%   BMI 28.08 kg/m    REVIEW OF SYSTEMS  PATIENT IS UNABLE TO PROVIDE COMPLETE REVIEW OF SYSTEMS DUE TO SEVERE CRITICAL ILLNESS   PHYSICAL EXAMINATION:  GENERAL:critically ill appearing, +resp distress HEAD: Normocephalic, atraumatic.  EYES: Pupils equal, round, reactive to light.  No scleral icterus.  MOUTH: Moist mucosal membrane. NECK: Supple. No thyromegaly. No nodules. No JVD.  PULMONARY: +rhonchi, +wheezing CARDIOVASCULAR: S1 and S2. Regular rate and rhythm. No murmurs, rubs, or gallops.  GASTROINTESTINAL: Soft, nontender, -distended. No masses. Positive bowel sounds. No hepatosplenomegaly.  MUSCULOSKELETAL: No swelling, clubbing, or edema.  NEUROLOGIC: obtunded, GCS<8 SKIN:intact,warm,dry  INTAKE/OUTPUT  Intake/Output Summary (Last 24 hours) at 04/11/2018 1647 Last data filed at 04/11/2018 1335 Gross per 24 hour  Intake 2002.75 ml  Output 650 ml  Net 1352.75 ml    LABS  CBC Recent Labs  Lab 03/29/2018 2022  04/10/18 0448 04/10/18 1302 04/10/18 2144 04/11/18 0517  WBC 30.4*  --  22.3*  --   --  25.8*  HGB 10.0*   < > 8.7* 8.6* 9.0* 8.5*  HCT 31.6*   < > 27.5* 26.7* 28.2* 27.0*  PLT 275  --  243  --   --  209   < > = values in this interval not displayed.   Coag's Recent Labs  Lab 04/07/18 0837 04/08/2018 1446 04/23/2018 1636  APTT  --   --  31  INR 1.19 1.44 1.50   BMET Recent Labs  Lab 04/06/2018 2022 04/11/2018 2340 04/10/18 0448 04/11/18 0517  NA 132*  --  133* 136  K 5.3* 5.6* 4.8 4.6  CL 100  --  104 111  CO2 19*  --  21* 20*  BUN 32*  --  35* 47*  CREATININE 0.82  --  0.82 1.16*  GLUCOSE 121*  --  187* 142*    Electrolytes Recent Labs  Lab 04/23/2018 2022 03/28/2018 2340 04/10/18 0448 04/11/18 0517  CALCIUM 7.3*  --  6.7* 5.9*  MG 2.0 1.9  --   --    Sepsis Markers Recent Labs  Lab 04/14/2018 2022 04/13/2018 2148 04/10/18 0016 04/10/18 0448 04/10/18 0457 04/11/18 0517  LATICACIDVEN  --  6.4* 3.5*  --  2.5*  --   PROCALCITON 0.51  --   --  0.67  --  0.74   ABG Recent Labs  Lab 04/25/2018 2051 04/10/18 0417  PHART 7.28* 7.39  PCO2ART 44 35  PO2ART 147* 143*   Liver Enzymes Recent Labs  Lab 03/29/2018 1415 04/10/18 0448 04/11/18 0517  AST 495* 1,882* 1,270*  ALT 150* 457* 456*  ALKPHOS 455* 578* 695*  BILITOT 9.0* 7.6* 5.3*  ALBUMIN 2.6* 2.1* 1.9*   Cardiac Enzymes Recent Labs  Lab 04/08/2018 2340  TROPONINI <0.03   Glucose Recent Labs  Lab 04/10/18 2347 04/11/18 0404 04/11/18 0730 04/11/18 1216 04/11/18 1220 04/11/18 1559  GLUCAP 132* 126* 130* 127* 129* 132*     Recent Results (from the past 240 hour(s))  MRSA PCR Screening     Status: None   Collection Time: 03/26/2018  7:25 PM  Result Value Ref Range Status   MRSA by PCR NEGATIVE NEGATIVE Final    Comment:        The GeneXpert MRSA Assay (FDA approved for NASAL specimens only), is one component of a comprehensive MRSA colonization surveillance program. It is not intended to diagnose MRSA infection nor to guide or monitor treatment for MRSA infections. Performed at [facility], [address], [place], [place] [address]   Urine Culture     Status: None   Collection Time: 03/31/2018 11:40 PM  Result Value Ref Range Status   Specimen Description   Final    URINE, RANDOM Performed at [facility], [address], [place], [place] [address]    Special Requests   Final    Normal Performed at [facility], [address] [place], [place] [address]    Culture   Final    NO GROWTH Performed at [facility], [place] [address], [place], [place] [address]    Report  Status 04/11/2018 FINAL  Final  CULTURE, BLOOD (ROUTINE X 2) w Reflex to ID Panel     Status: None (Preliminary result)   Collection Time: 04/10/18  1:05 AM  Result Value Ref Range Status   Specimen Description BLOOD BLOOD LEFT HAND  Final   Special Requests   Final    BOTTLES DRAWN AEROBIC AND ANAEROBIC Blood Culture adequate volume   Culture   Final    NO GROWTH 1 DAY Performed at [facility], [address], [place], [place] [address]    Report Status PENDING  Incomplete  CULTURE, BLOOD (ROUTINE X 2) w Reflex to ID Panel     Status: None (Preliminary result)   Collection Time: 04/10/18  1:14 AM  Result Value Ref Range Status   Specimen Description BLOOD BLOOD LEFT FOREARM  Final   Special Requests   Final    BOTTLES DRAWN AEROBIC AND ANAEROBIC Blood Culture adequate volume   Culture   Final    NO GROWTH 1 DAY Performed at [facility], [address] [place], [place] [address]    Report Status PENDING  Incomplete    MEDICATIONS   Current Facility-Administered Medications:  .  0.9 %  sodium chloride infusion, , Intravenous, Continuous, Gouru, Aruna, MD, Stopped at 04/10/18 1351 .  cefTRIAXone (ROCEPHIN) 1 g in sodium chloride 0.9 % 100 mL IVPB, 1 g, Intravenous, Q24H, Gouru, Aruna, MD, Last Rate: 200 mL/hr at 04/11/18 1536, 1 g at 04/11/18 1536 .  chlorhexidine gluconate (MEDLINE KIT) (PERIDEX) 0.12 % solution 15 mL, 15 mL, Mouth Rinse, BID, Darel Hong D, NP, 15 mL at 04/11/18 0835 .  DOPamine (INTROPIN) 800 mg in dextrose 5 % 250 mL (3.2 mg/mL) infusion, 0-20 mcg/kg/min, Intravenous, Titrated, Bradly Bienenstock, NP, Stopped at 04/10/18 [phone] .  insulin aspart (novoLOG) injection 0-15 Units, 0-15 Units, Subcutaneous, Q4H, Darel Hong D, NP, 2 Units at 04/11/18 1605 .  ipratropium-albuterol (DUONEB) 0.5-2.5 (3) MG/3ML nebulizer solution 3 mL, 3 mL, Nebulization, Q4H PRN, Darel Hong D, NP .  lactulose [facility]) enema 200 gm, 300 mL, Rectal, BID,  Darel Hong D, NP, 300 mL at 04/11/18 0835 .  [START ON 04/12/2018] levothyroxine (SYNTHROID, LEVOTHROID) tablet 88 mcg, 88 mcg, Per Tube, Q0600, Rosine Door, MD .  MEDLINE mouth rinse, 15 mL, Mouth Rinse, 10 times per day, Darel Hong D, NP, 15 mL at 04/11/18 1606 .  metroNIDAZOLE (FLAGYL) IVPB 500 mg, 500 mg, Intravenous, Q6H, Bradly Bienenstock,  NP, Stopped at 04/11/18 1327 .  midazolam (VERSED) 50 mg in sodium chloride 0.9 % 50 mL (1 mg/mL) infusion, 0.5-5 mg/hr, Intravenous, Titrated, Darel Hong D, NP, Last Rate: 1 mL/hr at 04/11/18 1335, 1 mg/hr at 04/11/18 1335 .  midazolam (VERSED) injection 2 mg, 2 mg, Intravenous, Q2H PRN, Darel Hong D, NP, 2 mg at 04/11/18 0440 .  norepinephrine (LEVOPHED) 16 mg in D5W 275m premix infusion, 0-40 mcg/min, Intravenous, Titrated, KDarel HongD, NP, Last Rate: 2.81 mL/hr at 04/11/18 1335, 3 mcg/min at 04/11/18 1335 .  [COMPLETED] octreotide (SANDOSTATIN) 2 mcg/mL load via infusion 50 mcg, 50 mcg, Intravenous, Once, 50 mcg at 04/15/2018 1631 **AND** octreotide (SANDOSTATIN) 500 mcg in sodium chloride 0.9 % 250 mL (2 mcg/mL) infusion, 50 mcg/hr, Intravenous, Continuous, Veronese, CK[place] MD, Last Rate: 25 mL/hr at 04/11/18 1349, 50 mcg/hr at 04/11/18 1349 .  pantoprazole (PROTONIX) 80 mg in sodium chloride 0.9 % 250 mL (0.32 mg/mL) infusion, 8 mg/hr, Intravenous, Continuous, Veronese, CK[place] MD, Last Rate: 25 mL/hr at 04/11/18 1335, 8 mg/hr at 04/11/18 1335 .  [START ON 04/13/2018] pantoprazole (PROTONIX) injection 40 mg, 40 mg, Intravenous, Q12H, Veronese, CK[place] MD .  sodium chloride flush (NS) 0.9 % injection 10-40 mL, 10-40 mL, Intracatheter, Q12H, Kasa, Kurian, MD, 10 mL at 04/11/18 1000 .  sodium chloride flush (NS) 0.9 % injection 10-40 mL, 10-40 mL, Intracatheter, PRN, KFlora Lipps MD      Indwelling Urinary Catheter continued, requirement due to   Reason to continue Indwelling Urinary Catheter for strict Intake/Output  monitoring for hemodynamic instability   Central Line continued, requirement due to   Reason to continue CKinder Morgan EnergyMonitoring of central venous pressure or other hemodynamic parameters   Ventilator continued, requirement due to, resp failure    Ventilator Sedation RASS 0 to -2     ASSESSMENT AND PLAN SYNOPSIS Pt with cirrhosis/stage IV hepatocellular caner and renal failure.GIB s/p varces banding DNR but to cont present care. Palliative team/GI/Oncology team on consult.   Resp afilure/airway protection -continue Full MV support -continue Bronchodilator Therapy -Wean Fio2 and PEEP as tolerated -will perform SAT/SBT when respiratory parameters are met   Liver failure with hep c /cirrhosis/portal HTN Cont lactulose for elevated ammonia S/P banding of esophageal varices /bleeding Cont IV PPI drip and octreotide  Mild AKI   ? hepatorenal vs ATN -follow chem 7 -follow UO -continue Foley Catheter-assess need daily IVF as tolerated   NEUROLOGY - intubated and sedated - minimal sedation to achieve a RASS goal: -1 Wake up assessment pending    Hepatocellular ca stage IV  Sepsis/ID ? UTI/aspiration pneumonia -continue IV abx as prescibed -follow up cultures Wean levophed as tolerated by bp  GI/Nutrition GI PROPHYLAXIS as indicated DIET-->TF's as tolerated Constipation protocol as indicated  ENDO - ICU hypoglycemic\Hyperglycemia protocol -check FSBS per protocol   ELECTROLYTES -follow labs as needed -replace as needed -pharmacy consultation and following   ASSESS the need for LABS as needed  DNR  Critical Care Time devoted to patient care services described in this note is 35 minutes.   Overall, patient is critically ill, prognosis is guarded.  Patient with Multiorgan failure and at high risk for cardiac arrest and death.  D/w mother/sister and boyfriend.  KRosine Door MD  04/11/2018 4:47 PM LVelora HecklerPulmonary & Critical Care Medicine

## 2018-04-11 NOTE — Progress Notes (Signed)
[name]  Telephone:([phone] [phone]   Name: [name] Date: 04/11/2018 MRN: [id number]  DOB: 02/08/1956  Patient Care Team: Jinny Sanders, MD as PCP - General (Family Medicine) Clent Jacks, RN as Registered Nurse Lloyd Huger, MD as Medical Oncologist (Medical Oncology)    REASON FOR CONSULTATION: Palliative Care consult requested for this 63 y.o. female with multiple medical problems including stage IV hepatocellular carcinoma, child Pugh class C cirrhosis, history of hepatitis C status post viral treatment, who was admitted to the hospital on 04/13/2018 with upper GI bleed.  Patient underwent EGD and was found to have grade 1 esophageal varices with successful banding.  Unfortunately patient was unable to be extubated following the procedure.  She is suspected to have aspiration pneumonia. Palliative care was consulted to help address goals.   CODE STATUS: DNR  PAST MEDICAL HISTORY: Past Medical History:  Diagnosis Date  . Acquired hypothyroidism    thyroid goiter s/p thyroidectomy  . Allergy   . Bipolar 1 disorder ([place]) 1990s  . Chronic hepatitis C (HCC)    genotype 1a  . Compensated HCV cirrhosis ([place])   . Complication of anesthesia    states 15 yrs ago, hard time waking up, states cant breathe when coming out of anesthesia  . Complication of anesthesia    per patient heart stopped  . COPD (chronic obstructive pulmonary disease) ([place])   . Depression    states seen weekly at [facility]  . GERD (gastroesophageal reflux disease)    Hpylori + and treated (2012)  . Hepatitis C   . History of alcohol abuse   . Insomnia   . Migraines   . Pneumonia   . PONV (postoperative nausea and vomiting)    "it all depends on what kind they give me"  . Portal hypertension ([place])   . Portal hypertensive gastropathy ([place]) 2013  . Smoker   . Stroke ([place])   . TIA (transient ischemic attack)   .  Varicose veins     PAST SURGICAL HISTORY:  Past Surgical History:  Procedure Laterality Date  . APPENDECTOMY    . DIAGNOSTIC LAPAROSCOPY     15 yrs ago  . ELECTROMAGNETIC NAVIGATION BROCHOSCOPY N/A 08/15/2017   Procedure: ELECTROMAGNETIC NAVIGATION BRONCHOSCOPY;  Surgeon: Flora Lipps, MD;  Location: [facility] ORS;  Service: Cardiopulmonary;  Laterality: N/A;  . ESOPHAGOGASTRODUODENOSCOPY  10/2010   nl esophagus, HH, portal hypertensive gastropathy, erosive gastropathy, Hpylori + (Dr. Sydnee Levans)  . ESOPHAGOGASTRODUODENOSCOPY (EGD) WITH PROPOFOL N/A 04/22/2018   Procedure: ESOPHAGOGASTRODUODENOSCOPY (EGD) WITH PROPOFOL;  Surgeon: Tol[place], Benay Pike, MD;  Location: [facility] ENDOSCOPY;  Service: Gastroenterology;  Laterality: N/A;  . foot surgery Bilateral 2017   Callus removal  . IR RADIOLOGIST EVAL & MGMT  02/19/2018  . left foot surgery  1990s  . LIVER BIOPSY    . THYROIDECTOMY  05/17/2011   Procedure: TOTAL THYROIDECTOMY;  Surgeon: Earnstine Regal, MD, benign goiter per path report    HEMATOLOGY/ONCOLOGY HISTORY:    Hepatocellular carcinoma ([place])   01/26/2018 Initial Diagnosis    Hepatocellular carcinoma ([place])    01/28/2018 Cancer Staging    Staging form: Liver, AJCC 8th Edition - Clinical stage from 01/28/2018: Stage IIIA (cT3, cN0, cM0) - Signed by Lloyd Huger, MD on 01/28/2018     ALLERGIES:  is allergic to demerol; other; penicillins; propofol; latex; and oxycodone hcl.  MEDICATIONS:  Current Facility-Administered Medications  Medication Dose Route Frequency Provider  Last Rate Last Dose  . 0.9 %  sodium chloride infusion   Intravenous Continuous Nicholes Mango, MD   Stopped at 04/10/18 1351  . cefTRIAXone (ROCEPHIN) 1 g in sodium chloride 0.9 % 100 mL IVPB  1 g Intravenous Q24H Nicholes Mango, MD   Stopped at 04/10/18 1524  . chlorhexidine gluconate (MEDLINE KIT) (PERIDEX) 0.12 % solution 15 mL  15 mL Mouth Rinse BID Darel Hong D, NP   15 mL at 04/11/18 0835  . DOPamine  (INTROPIN) 800 mg in dextrose 5 % 250 mL (3.2 mg/mL) infusion  0-20 mcg/kg/min Intravenous Titrated Bradly Bienenstock, NP   Stopped at 04/10/18 [phone]  . insulin aspart (novoLOG) injection 0-15 Units  0-15 Units Subcutaneous Q4H Darel Hong D, NP   2 Units at 04/11/18 1224  . ipratropium-albuterol (DUONEB) 0.5-2.5 (3) MG/3ML nebulizer solution 3 mL  3 mL Nebulization Q4H PRN Darel Hong D, NP      . lactulose [facility]) enema 200 gm  300 mL Rectal BID Darel Hong D, NP   300 mL at 04/11/18 0835  . MEDLINE mouth rinse  15 mL Mouth Rinse 10 times per day Darel Hong D, NP   15 mL at 04/11/18 1336  . metroNIDAZOLE (FLAGYL) IVPB 500 mg  500 mg Intravenous Q6H Bradly Bienenstock, NP   Stopped at 04/11/18 1327  . midazolam (VERSED) 50 mg in sodium chloride 0.9 % 50 mL (1 mg/mL) infusion  0.5-5 mg/hr Intravenous Titrated Darel Hong D, NP 1 mL/hr at 04/11/18 1335 1 mg/hr at 04/11/18 1335  . midazolam (VERSED) injection 2 mg  2 mg Intravenous Q2H PRN Darel Hong D, NP   2 mg at 04/11/18 0440  . norepinephrine (LEVOPHED) 16 mg in D5W 263m premix infusion  0-40 mcg/min Intravenous Titrated KDarel HongD, NP 2.81 mL/hr at 04/11/18 1335 3 mcg/min at 04/11/18 1335  . octreotide (SANDOSTATIN) 500 mcg in sodium chloride 0.9 % 250 mL (2 mcg/mL) infusion  50 mcg/hr Intravenous Continuous VAlfred Levins CKent[place] MD 25 mL/hr at 04/11/18 1349 50 mcg/hr at 04/11/18 1349  . pantoprazole (PROTONIX) 80 mg in sodium chloride 0.9 % 250 mL (0.32 mg/mL) infusion  8 mg/hr Intravenous Continuous VAlfred Levins CK[place] MD 25 mL/hr at 04/11/18 1335 8 mg/hr at 04/11/18 1335  . [START ON 04/13/2018] pantoprazole (PROTONIX) injection 40 mg  40 mg Intravenous Q12H Veronese, CK[place] MD      . sodium chloride flush (NS) 0.9 % injection 10-40 mL  10-40 mL Intracatheter Q12H KFlora Lipps MD   10 mL at 04/11/18 1000  . sodium chloride flush (NS) 0.9 % injection 10-40 mL  10-40 mL Intracatheter PRN KFlora Lipps MD         VITAL SIGNS: BP (!) 120/53   Pulse 91   Temp 97.6 F (36.4 C) (Oral)   Resp (!) 26   Ht '5\' 3"'  (1.6 m)   Wt 158 lb 8.2 oz (71.9 kg)   SpO2 95%   BMI 28.08 kg/m  Filed Weights   04/17/2018 1410 04/07/2018 1754 03/29/2018 1930  Weight: 158 lb (71.7 kg) 158 lb (71.7 kg) 158 lb 8.2 oz (71.9 kg)    Estimated body mass index is 28.08 kg/m as calculated from the following:   Height as of this encounter: '5\' 3"'  (1.6 m).   Weight as of this encounter: 158 lb 8.2 oz (71.9 kg).  LABS: CBC:    Component Value Date/Time   WBC 25.8 (H) 04/11/2018 0517   HGB 8.5 (L) 04/11/2018 03570  HCT 27.0 (L) 04/11/2018 0517   PLT 209 04/11/2018 0517   MCV 91.2 04/11/2018 0517   NEUTROABS 13.7 (H) 04/19/2018 1415   LYMPHSABS 1.2 04/10/2018 1415   MONOABS 3.0 (H) 04/18/2018 1415   EOSABS 0.0 04/04/2018 1415   BASOSABS 0.1 04/22/2018 1415   Comprehensive Metabolic Panel:    Component Value Date/Time   NA 136 04/11/2018 0517   NA 142 09/26/2011   K 4.6 04/11/2018 0517   CL 111 04/11/2018 0517   CO2 20 (L) 04/11/2018 0517   BUN 47 (H) 04/11/2018 0517   BUN 11 09/26/2011   CREATININE 1.16 (H) 04/11/2018 0517   CREATININE 0.65 01/27/2015 1719   GLUCOSE 142 (H) 04/11/2018 0517   CALCIUM 5.9 (LL) 04/11/2018 0517   AST 1,270 (H) 04/11/2018 0517   AST 137 09/26/2011   ALT 456 (H) 04/11/2018 0517   ALKPHOS 695 (H) 04/11/2018 0517   ALKPHOS 74 09/26/2011   BILITOT 5.3 (H) 04/11/2018 0517   BILITOT 1.2 09/26/2011   PROT 5.7 (L) 04/11/2018 0517   PROT 7.8 05/22/2017 1054   ALBUMIN 1.9 (L) 04/11/2018 0517   ALBUMIN 4.1 09/26/2011    RADIOGRAPHIC STUDIES: Ct Abdomen Pelvis Wo Contrast  Result Date: 04/02/2018 CLINICAL DATA:  reports vomiting dark red blood, states four episodes today. Denies hx of same, denies blood thinners, denies alcohol use. Pt tachypneic, appears short of breath. Pt is a cancer patient, currently being treated for liver cancer. Pt abdomen very taut and distended. EXAM: CT  ABDOMEN AND PELVIS WITHOUT CONTRAST TECHNIQUE: Multidetector CT imaging of the abdomen and pelvis was performed following the standard protocol without IV contrast. COMPARISON:  PET-CT, 01/31/2018 FINDINGS: Lower chest: No acute findings. Hepatobiliary: Liver shows extensive nodularity. There is enlargement of the inferior aspect of the right lobe and lateral segment of the left lobe. Subtle hypoattenuating areas are noted in the liver, poorly defined on this unenhanced study, consistent with the known multifocal hepatocellular carcinoma. Gallbladder is unremarkable.  No bile duct dilation. Pancreas: Unremarkable. No pancreatic ductal dilatation or surrounding inflammatory changes. Spleen: Spleen borderline enlarged measuring 12.3 cm in greatest dimension, stable from the prior PET-CT. No splenic mass or focal lesion. Adrenals/Urinary Tract: No adrenal masses. Kidneys normal size, orientation and position. No renal masses or stones. No hydronephrosis. Ureters normal in course and in caliber. Bladder is unremarkable. Stomach/Bowel: Small hiatal hernia. Stomach is otherwise unremarkable. Small bowel is normal in caliber with no wall thickening or adjacent inflammation. Colon is normal in caliber. No wall thickening or convincing inflammation. Vascular/Lymphatic: There are numerous venous collaterals in the upper abdomen, with prominent paraesophageal varices. These are stable from the prior PET-CT. There are dense atherosclerotic calcifications along a normal caliber abdominal aorta. There are multiple prominent and enlarged upper abdominal lymph nodes along the peri celiac and gastrohepatic ligaments, stable from the prior PET-CT. Reproductive: Uterus and bilateral adnexa are unremarkable. Other: There is now a small amount of ascites collecting adjacent to the liver, and tracking into the pelvis collecting in the pelvic recesses. Musculoskeletal: No fracture or acute finding. No osteoblastic or osteolytic lesions.  IMPRESSION: 1. No acute findings within the abdomen or pelvis. 2. Advanced cirrhosis with portal venous hypertension. The multifocal hepatocellular carcinoma noted on the prior PET-CT is not well-defined on this unenhanced study, but shows no convincing change. 3. Prominent and enlarged upper abdominal lymph nodes, stable from the prior PET-CT. 4. Since the prior PET-CT, a small amount of ascites has developed. Electronically Signed   By:  Lajean Manes M.D.   On: 04/04/2018 15:33   Dg Chest 1 View  Result Date: 04/10/2018 CLINICAL DATA:  Central line placement EXAM: CHEST  1 VIEW COMPARISON:  04/24/2018 at 1920 hours FINDINGS: Right IJ venous catheter terminates in the mid SVC. Endotracheal tube terminates 4 cm above the carina. Mild hazy lingular opacity, new. No pleural effusion or pneumothorax. The heart is normal in size. IMPRESSION: Right IJ venous catheter terminates in the mid SVC. Electronically Signed   By: Julian Hy M.D.   On: 04/10/2018 00:17   Dg Chest 2 View  Result Date: 03/21/2018 CLINICAL DATA:  Community acquired pneumonia. EXAM: CHEST - 2 VIEW COMPARISON:  Chest x-ray July 15, 2017, PET-CT January 31, 2018 FINDINGS: The heart size and mediastinal contours are within normal limits. There is a 2 cm nodule in the left upper lobe described on previous PET-CT. There is no focal pneumonia, pulmonary edema, or pleural effusion. The visualized skeletal structures are unremarkable. IMPRESSION: 2 cm nodule in the left upper lobe described on the recent previous PET-CT. Neoplasm is not excluded. No focal pneumonia is identified. Electronically Signed   By: Abelardo Diesel M.D.   On: 03/21/2018 19:18   Dg Abd 1 View  Result Date: 03/21/2018 CLINICAL DATA:  Abdominal pain, distention, nausea, vomiting x3-4 months, worse x2-3 days. Hx of hepatitis, GERD, HTN. EXAM: ABDOMEN - 1 VIEW COMPARISON:  PET-CT on 01/31/2018 FINDINGS: Spleen is enlarged. Bowel gas pattern is nonobstructive. No  suspicious lytic or blastic lesions are identified. IMPRESSION: Nonobstructed bowel-gas pattern. Splenomegaly. Electronically Signed   By: Nolon Nations M.D.   On: 03/21/2018 14:05   US Abdomen Limited  Result Date: 03/22/2018 CLINICAL DATA:  63 year old with history of hepatitis-C, cirrhosis and hepatocellular carcinoma. Evaluate for ascites and spontaneous bacterial peritonitis. EXAM: LIMITED ABDOMEN ULTRASOUND FOR ASCITES TECHNIQUE: Limited ultrasound survey for ascites was performed in all four abdominal quadrants. COMPARISON:  PET-CT 01/31/2018 FINDINGS: Spleen is prominent for size without surrounding fluid. There is no ascites identified in the left upper quadrant, left lower quadrant or right lower quadrant. Trace perihepatic ascites near the dome. Liver is heterogeneous and compatible with cirrhosis. IMPRESSION: No significant ascites. Trace ascites around the liver. Paracentesis not performed. Electronically Signed   By: Markus Daft M.D.   On: 03/22/2018 15:55   Dg Chest Port 1 View  Result Date: 04/11/2018 CLINICAL DATA:  Acute respiratory failure EXAM: PORTABLE CHEST 1 VIEW COMPARISON:  04/10/2018 FINDINGS: Endotracheal tube terminates 4 cm above the carina. Right IJ venous catheter terminates in the lower SVC. Mild left lower lobe opacity, likely atelectasis. Right lung is clear. No pleural effusion or pneumothorax. The heart is normal in size. Surgical clips along the bilateral neck. IMPRESSION: Endotracheal tube terminates 4 cm above the carina. Mild left lower lobe opacity, likely atelectasis. Electronically Signed   By: Julian Hy M.D.   On: 04/11/2018 03:36   Portable Chest Xray  Result Date: 04/10/2018 CLINICAL DATA:  Endotracheal tube present EXAM: PORTABLE CHEST 1 VIEW COMPARISON:  Yesterday FINDINGS: Endotracheal tube tip between the clavicular heads and carina. Right IJ line with tip at the SVC. Retrocardiac airspace disease. No edema, effusion, or pneumothorax. Normal  heart size. IMPRESSION: 1. Stable hardware positioning. 2. Retrocardiac atelectasis or infection. Electronically Signed   By: Monte Fantasia M.D.   On: 04/10/2018 06:28   Portable Chest X-ray  Result Date: 04/15/2018 CLINICAL DATA:  Evaluate endotracheal tube. EXAM: PORTABLE CHEST 1 VIEW COMPARISON:  03/21/2018 FINDINGS: Endotracheal  tube tip projects approximately 4 mm above the Carina, pointed towards the right mainstem bronchus orifice. Cardiac silhouette is normal in size. No mediastinal or hilar masses. Lungs show chronically prominent bronchovascular markings. No evidence of pneumonia or pulmonary edema. No pleural effusion or pneumothorax. Multiple surgical vascular clips at the neck base consistent with a prior thyroidectomy. IMPRESSION: 1. Endotracheal tube tip projects low, only approximately 4 mm above the inferior margin of the Carina, angled towards the right mainstem bronchus. Consider retracting 1 to 1.5 cm. 2. No acute cardiopulmonary disease. Electronically Signed   By: Lajean Manes M.D.   On: 04/19/2018 19:39   Dg Abd 2 Views  Result Date: 04/08/2018 CLINICAL DATA:  63 year old with hepatic cirrhosis and hepatocellular carcinoma due to chronic hepatitis C presenting with constipation for approximately 3 weeks. EXAM: ABDOMEN - 2 VIEW COMPARISON:  03/21/2018 and earlier, including PET-CT 01/31/2018. FINDINGS: Bowel gas pattern unremarkable without evidence of obstruction or significant ileus. No evidence of air-fluid levels or free intraperitoneal air on the ERECT image. Expected stool burden in the colon. No visible urinary tract calculi. Splenomegaly, as the inferior tip of the spleen extends nearly to the pelvic brim. Degenerative changes involving the lumbar spine. IMPRESSION: 1. No acute abdominal abnormality. 2. Splenomegaly, as the inferior tip of the spleen extends nearly to the pelvic brim. Electronically Signed   By: Evangeline Dakin M.D.   On: 04/08/2018 09:00    PERFORMANCE  STATUS (ECOG) : 4 - Bedbound  Review of Systems As noted above. Otherwise, a complete review of systems is negative.  Physical Exam General: critically ill appearing Cardiovascular: regular rate and rhythm Pulmonary: unlabored, ETT to vent GU: foley Extremities: edema LEs Skin: no rashes Neurological: sedated  IMPRESSION: Patient remains in ICU on vent (day #3). No significant improvement overnight.   I spoke with pateint's sister, brother, and mother. All recognize that patient is likely nearing end of life. We again reviewed option of continued treatment with one-way extubation vs a transition to comfort care. Family are okay with continued treatment for now. They all verbalize a desire not to let her "suffer".  Patient is now a DNR.    PLAN: Continue current treatment Will follow   Time Total: 25 minutes  Visit consisted of counseling and education dealing with the complex and emotionally intense issues of symptom management and palliative care in the setting of serious and potentially life-threatening illness.Greater than 50%  of this time was spent counseling and coordinating care related to the above assessment and plan.  Signed by: Altha Harm, PhD, NP-C [phone] (Work Cell)

## 2018-04-11 NOTE — Progress Notes (Signed)
[facility] note.  Events noted. Clinical status continues to decline with Oilguric Renal failure, persistent respiratory failure secondary to pneumonia and persistent encephalopathy. No further gastrointestinal bleeding.  Family has decided, given poor prognosis of progressive Stage IV hepatocellular carcinoma with MSOF, to make patient DNR status.  I will sign off for now. Call back in the interim as needed.   Robet Leu, M.D. Gastroenterology [facility]

## 2018-04-12 ENCOUNTER — Inpatient Hospital Stay: Payer: Medicare HMO

## 2018-04-12 LAB — CBC
HCT: 27 % — ABNORMAL LOW (ref 36.0–46.0)
Hemoglobin: 8.4 g/dL — ABNORMAL LOW (ref 12.0–15.0)
MCH: 28.5 pg (ref 26.0–34.0)
MCHC: 31.1 g/dL (ref 30.0–36.0)
MCV: 91.5 fL (ref 80.0–100.0)
NRBC: 0.2 % (ref 0.0–0.2)
Platelets: 159 10*3/uL (ref 150–400)
RBC: 2.95 MIL/uL — ABNORMAL LOW (ref 3.87–5.11)
RDW: 21.2 % — ABNORMAL HIGH (ref 11.5–15.5)
WBC: 17.3 10*3/uL — AB (ref 4.0–10.5)

## 2018-04-12 LAB — COMPREHENSIVE METABOLIC PANEL
ALT: 344 U/L — ABNORMAL HIGH (ref 0–44)
AST: 740 U/L — ABNORMAL HIGH (ref 15–41)
Albumin: 2.1 g/dL — ABNORMAL LOW (ref 3.5–5.0)
Alkaline Phosphatase: 717 U/L — ABNORMAL HIGH (ref 38–126)
Anion gap: 5 (ref 5–15)
BILIRUBIN TOTAL: 5 mg/dL — AB (ref 0.3–1.2)
BUN: 48 mg/dL — ABNORMAL HIGH (ref 8–23)
CO2: 18 mmol/L — ABNORMAL LOW (ref 22–32)
Calcium: 5.5 mg/dL — CL (ref 8.9–10.3)
Chloride: 115 mmol/L — ABNORMAL HIGH (ref 98–111)
Creatinine, Ser: 1.05 mg/dL — ABNORMAL HIGH (ref 0.44–1.00)
GFR calc Af Amer: >60 mL/min (ref >60)
GFR calc non Af Amer: 56 mL/min — ABNORMAL LOW (ref >60)
Glucose, Bld: 139 mg/dL — ABNORMAL HIGH (ref 70–99)
Potassium: 4.3 mmol/L (ref 3.5–5.1)
Sodium: 138 mmol/L (ref 135–145)
TOTAL PROTEIN: 5.7 g/dL — AB (ref 6.5–8.1)

## 2018-04-12 LAB — GLUCOSE, CAPILLARY
GLUCOSE-CAPILLARY: 132 mg/dL — AB (ref 70–99)
GLUCOSE-CAPILLARY: 115 mg/dL — AB (ref 70–99)
Glucose-Capillary: 113 mg/dL — ABNORMAL HIGH (ref 70–99)
Glucose-Capillary: 122 mg/dL — ABNORMAL HIGH (ref 70–99)
Glucose-Capillary: 127 mg/dL — ABNORMAL HIGH (ref 70–99)
Glucose-Capillary: 128 mg/dL — ABNORMAL HIGH (ref 70–99)
Glucose-Capillary: 130 mg/dL — ABNORMAL HIGH (ref 70–99)

## 2018-04-12 MED ORDER — FENTANYL CITRATE (PF) 100 MCG/2ML IJ SOLN
50.0000 ug | INTRAMUSCULAR | Status: DC | PRN
  Administered 2018-04-13 – 2018-04-14 (×3): 50 ug via INTRAVENOUS
  Filled 2018-04-12 (×3): qty 2

## 2018-04-12 MED ORDER — FUROSEMIDE 10 MG/ML IJ SOLN
20.0000 mg | Freq: Once | INTRAMUSCULAR | Status: AC
  Administered 2018-04-12: 20 mg via INTRAVENOUS
  Filled 2018-04-12: qty 2

## 2018-04-12 MED ORDER — CALCIUM GLUCONATE-NACL 1-0.675 GM/50ML-% IV SOLN
1.0000 g | Freq: Once | INTRAVENOUS | Status: AC
  Administered 2018-04-12: 1000 mg via INTRAVENOUS
  Filled 2018-04-12: qty 50

## 2018-04-12 NOTE — Progress Notes (Signed)
[facility] [place] NAME: Valrie Jia    MR#:  [id number]  DATE OF BIRTH:  [birth year]  SUBJECTIVE:  CHIEF COMPLAINT:   Chief Complaint  Patient presents with  . GI Bleeding   -Intubated and sedated.  Remains on Levophed drip.  WBC slowly improving, also LFTs improving  REVIEW OF SYSTEMS:  Review of Systems  Unable to perform ROS: Critical illness    DRUG ALLERGIES:   Allergies  Allergen Reactions  . Demerol Anaphylaxis  . Other Other (See Comments)    general anesthesia - heart stopped   . Penicillins Anaphylaxis, Hives and Itching    Has patient had a PCN reaction causing immediate rash, facial/tongue/throat swelling, SOB or lightheadedness with hypotension: Yes Has patient had a PCN reaction causing severe rash involving mucus membranes or skin necrosis: Yes Has patient had a PCN reaction that required hospitalization: Yes Has patient had a PCN reaction occurring within the last 10 years: No If all of the above answers are "NO", then may proceed with Cephalosporin use.   Marland Kitchen Propofol Other (See Comments)    Allergic, caused her to code   . Latex Rash  . Oxycodone Hcl Rash    VITALS:  Blood pressure (!) 109/47, pulse 88, temperature 97.8 F (36.6 C), temperature source Axillary, resp. rate (!) 22, height 5\' 3"  (1.6 m), weight 71.9 kg, SpO2 94 %.  PHYSICAL EXAMINATION:  Physical Exam  GENERAL:  63 y.o.-year-old patient lying in the bed, intubated and on vent.  Critically ill-appearing EYES: Pupils equal, round, reactive to light and accommodation.  Positive for scleral icterus. Extraocular muscles intact.  HEENT: Head atraumatic, normocephalic. Oropharynx and nasopharynx clear.  NECK:  Supple, no jugular venous distention. No thyroid enlargement, no tenderness.  LUNGS: Normal breath sounds bilaterally, no wheezing, rales,rhonchi or crepitation. No use of accessory muscles of respiration.  Decreased bibasilar breath sounds  noted CARDIOVASCULAR: S1, S2 normal. No  rubs, or gallops. 2/6 systolic murmur present ABDOMEN: Soft, distended, hypoactive Bowel sounds present. No organomegaly or mass.  EXTREMITIES: No pedal edema, cyanosis, or clubbing.  NEUROLOGIC: Intubated and sedated.  Withdrawing to pain PSYCHIATRIC: The patient is sedated SKIN: No obvious rash, lesion, or ulcer.  Telangiectasias noted on the chest.  Palmar erythema present   LABORATORY PANEL:   CBC Recent Labs  Lab 04/12/18 0431  WBC 17.3*  HGB 8.4*  HCT 27.0*  PLT 159   ------------------------------------------------------------------------------------------------------------------  Chemistries  Recent Labs  Lab 04/02/2018 2340  04/12/18 0431  NA  --    < > 138  K 5.6*   < > 4.3  CL  --    < > 115*  CO2  --    < > 18*  GLUCOSE  --    < > 139*  BUN  --    < > 48*  CREATININE  --    < > 1.05*  CALCIUM  --    < > 5.5*  MG 1.9  --   --   AST  --    < > 740*  ALT  --    < > 344*  ALKPHOS  --    < > 717*  BILITOT  --    < > 5.0*   < > = values in this interval not displayed.   ------------------------------------------------------------------------------------------------------------------  Cardiac Enzymes Recent Labs  Lab 04/17/2018 2340  TROPONINI <0.03   ------------------------------------------------------------------------------------------------------------------  RADIOLOGY:  Dg Chest [facility]  Result Date: 04/11/2018 CLINICAL DATA:  Acute respiratory failure EXAM: PORTABLE CHEST 1 VIEW COMPARISON:  04/10/2018 FINDINGS: Endotracheal tube terminates 4 cm above the carina. Right IJ venous catheter terminates in the lower SVC. Mild left lower lobe opacity, likely atelectasis. Right lung is clear. No pleural effusion or pneumothorax. The heart is normal in size. Surgical clips along the bilateral neck. IMPRESSION: Endotracheal tube terminates 4 cm above the carina. Mild left lower lobe opacity, likely atelectasis.  Electronically Signed   By: Julian Hy M.D.   On: 04/11/2018 03:36    EKG:   Orders placed or performed during the hospital encounter of 03/29/2018  . ED EKG  . ED EKG  . EKG 12-Lead  . EKG 12-Lead    ASSESSMENT AND PLAN:   64 year old female with past medical history significant for hepatitis C, liver cirrhosis, stage IV hepatocellular carcinoma, history of esophageal varices, bipolar, ongoing alcohol use presents to hospital secondary to hematemesis.  1.  Hematemesis-GI bleed, seen by GI and patient had EGD on admission.  Showing actively bleeding esophageal varices that were banded and bleeding was stopped. -Continue to keep n.p.o., remains on octreotide and Protonix drip -Appreciate GI consult -Hemoglobin is stable.  2.  Acute respiratory failure-hypoxic respiratory failure secondary to aspiration pneumonia -Management per ICU team -Continue to wean FiO2 currently on 40%, spontaneous breathing trial when tolerated.  Currently still on sedation -Continue IV antibiotics-Rocephin and Flagyl  3.  Septic shock-secondary to aspiration pneumonia -Echocardiogram with normal EF of 55 to 75%-IE systolic or diastolic dysfunction noted -On low-dose Levophed.  Also on IV antibiotics which will be continued  4.  Acute alcoholic liver failure-continue to monitor LFTs.  Slowly improving. -Has underlying hepatocellular carcinoma, ongoing alcohol use and alcoholic liver cirrhosis.  5.  DVT prophylaxis-teds and SCDs for now  6.  Hepatocellular carcinoma-appreciate oncology input.  Due to elevated AFP and even if patient survives this hospitalization, not a candidate for further ablation due to multiple comorbidities at this time. -Overall poor prognosis  7.  Acute renal insufficiency-secondary to ATN from sepsis -Continue to monitor urine output.   Critically ill-appearing.  High risk for cardiorespiratory arrest Overall poor prognosis.  Appreciate palliative care consult. Family  changed patient's CODE STATUS to DNR at this time.   -Continue current level of care at this time  All the records are reviewed and case discussed with Care Management/Social Workerr. Management plans discussed with the patient, family and they are in agreement.  CODE STATUS: Full code  TOTAL TIME TAKING CARE OF THIS PATIENT: 28 minutes.   POSSIBLE D/C IN ?  DAYS, DEPENDING ON CLINICAL CONDITION.   Gladstone Lighter M.D on 04/12/2018 at 7:42 AM  Between 7am to 6pm - Pager - [phone]  After 6pm go to [email or web address] - password [facility]  Office  [phone]  CC: Primary care physician; Jinny Sanders, MD

## 2018-04-12 NOTE — Progress Notes (Signed)
Ch conversed w/ pt family in the ICU waiting room. Ch listened as family reflected on pass times and having to find a 'new normal' if the pt health continues to decline. Pt family seemed upset w/ the plan of care and lack of clarity but also accepting that the pt has several health challenges and were just trying to prepare mentally and emotionally and asked for prayers for her. Ch would f/u with pt family since she is not responsive yet.      04/12/18 2000  Clinical Encounter Type  Visited With Family  Visit Type Initial;Spiritual support;Social support  Referral From Chaplain  Consult/Referral To Chaplain  Spiritual Encounters  Spiritual Needs Grief support;Emotional;Prayer  Stress Factors  Patient Stress Factors Exhausted;Health changes;Loss;Loss of control;Major life changes  Family Stress Factors Major life changes;Loss of control;Loss;Lack of knowledge;Exhausted

## 2018-04-12 NOTE — Accreditation Note (Signed)
Versed gtt turned off at 1248, pt not following commands or opening eyes. Pt does respond to painful stimuli, pupils equal and reactive. Will continue to monitor closely.

## 2018-04-12 NOTE — Progress Notes (Signed)
CRITICAL CARE NOTE  CC  follow up respiratory failure  SUBJECTIVE Patient remains critically ill Prognosis is guarded No fever.remains comfortable on vent/sedated     SIGNIFICANT EVENTS none    BP (!) 120/47   Pulse 93   Temp 98.5 F (36.9 C) (Oral)   Resp (!) 26   Ht _0  (1.6 m)   Wt 71.9 kg   SpO2 95%   BMI 28.08 kg/m    REVIEW OF SYSTEMS  PATIENT IS UNABLE TO PROVIDE COMPLETE REVIEW OF SYSTEMS DUE TO SEVERE CRITICAL ILLNESS   PHYSICAL EXAMINATION:  GENERAL:critically ill appearing, no resp distress HEAD: Normocephalic, atraumatic.  EYES: Pupils equal, round, reactive to light.  No scleral icterus.  MOUTH: Moist mucosal membrane. NECK: Supple. No thyromegaly. No nodules. No JVD.  PULMONARY: few rhonci no wheezing CARDIOVASCULAR: S1 and S2. Regular rate and rhythm. No murmurs, rubs, or gallops.  GASTROINTESTINAL: Soft, nontender, -distended. No masses. Positive/sluggish  bowel sounds.  MUSCULOSKELETAL: No swelling, clubbing, ++ edema.  NEUROLOGIC:sedated SKIN:intact,warm,dry  INTAKE/OUTPUT  Intake/Output Summary (Last 24 hours) at 04/12/2018 1308 Last data filed at 04/12/2018 0600 Gross per 24 hour  Intake 1452.83 ml  Output 1375 ml  Net 77.83 ml    LABS  CBC Recent Labs  Lab 04/10/18 0448  04/10/18 2144 04/11/18 0517 04/12/18 0431  WBC 22.3*  --   --  25.8* 17.3*  HGB 8.7*   < > 9.0* 8.5* 8.4*  HCT 27.5*   < > 28.2* 27.0* 27.0*  PLT 243  --   --  209 159   < > = values in this interval not displayed.   Coag's Recent Labs  Lab 04/07/18 0837 04/08/2018 1446 03/31/2018 1636  APTT  --   --  31  INR 1.19 1.44 1.50   BMET Recent Labs  Lab 04/10/18 0448 04/11/18 0517 04/12/18 0431  NA 133* 136 138  K 4.8 4.6 4.3  CL 104 111 115*  CO2 21* 20* 18*  BUN 35* 47* 48*  CREATININE 0.82 1.16* 1.05*  GLUCOSE 187* 142* 139*   Electrolytes Recent Labs  Lab 04/13/2018 2022 04/17/2018 2340 04/10/18 0448 04/11/18 0517 04/12/18 0431  CALCIUM  7.3*  --  6.7* 5.9* 5.5*  MG 2.0 1.9  --   --   --    Sepsis Markers Recent Labs  Lab 04/23/2018 2022 04/18/2018 2148 04/10/18 0016 04/10/18 0448 04/10/18 0457 04/11/18 0517  LATICACIDVEN  --  6.4* 3.5*  --  2.5*  --   PROCALCITON 0.51  --   --  0.67  --  0.74   ABG Recent Labs  Lab 03/31/2018 2051 04/10/18 0417  PHART 7.28* 7.39  PCO2ART 44 35  PO2ART 147* 143*   Liver Enzymes Recent Labs  Lab 04/10/18 0448 04/11/18 0517 04/12/18 0431  AST 1,882* 1,270* 740*  ALT 457* 456* 344*  ALKPHOS 578* 695* 717*  BILITOT 7.6* 5.3* 5.0*  ALBUMIN 2.1* 1.9* 2.1*   Cardiac Enzymes Recent Labs  Lab 04/15/2018 2340  TROPONINI <0.03   Glucose Recent Labs  Lab 04/11/18 1559 04/11/18 1932 04/12/18 0000 04/12/18 0322 04/12/18 0741 04/12/18 1213  GLUCAP 132* 133* 127* 130* 132* 128*     Recent Results (from the past 240 hour(s))  MRSA PCR Screening     Status: None   Collection Time: 04/10/2018  7:25 PM  Result Value Ref Range Status   MRSA by PCR NEGATIVE NEGATIVE Final    Comment:        The  GeneXpert MRSA Assay (FDA approved for NASAL specimens only), is one component of a comprehensive MRSA colonization surveillance program. It is not intended to diagnose MRSA infection nor to guide or monitor treatment for MRSA infections. Performed at [facility], [address], [place], [place] [address]   Urine Culture     Status: None   Collection Time: 03/30/2018 11:40 PM  Result Value Ref Range Status   Specimen Description   Final    URINE, RANDOM Performed at [facility], [address] [place], [place] [address]    Special Requests   Final    Normal Performed at [facility], [address] [place], [place] [address]    Culture   Final    NO GROWTH Performed at [facility], [place] [address], [place], [place] [address]    Report Status 04/11/2018 FINAL  Final  CULTURE, BLOOD (ROUTINE X 2) w Reflex to ID Panel     Status: None  (Preliminary result)   Collection Time: 04/10/18  1:05 AM  Result Value Ref Range Status   Specimen Description BLOOD BLOOD LEFT HAND  Final   Special Requests   Final    BOTTLES DRAWN AEROBIC AND ANAEROBIC Blood Culture adequate volume   Culture   Final    NO GROWTH 2 DAYS Performed at [facility], [address] [place],  [address]    Report Status PENDING  Incomplete  CULTURE, BLOOD (ROUTINE X 2) w Reflex to ID Panel     Status: None (Preliminary result)   Collection Time: 04/10/18  1:14 AM  Result Value Ref Range Status   Specimen Description BLOOD BLOOD LEFT FOREARM  Final   Special Requests   Final    BOTTLES DRAWN AEROBIC AND ANAEROBIC Blood Culture adequate volume   Culture   Final    NO GROWTH 2 DAYS Performed at [facility], [address], [place],  [address]    Report Status PENDING  Incomplete    MEDICATIONS   Current Facility-Administered Medications:  .  0.9 %  sodium chloride infusion, , Intravenous, Continuous, Gouru, Aruna, MD, Stopped at 04/10/18 1351 .  cefTRIAXone (ROCEPHIN) 1 g in sodium chloride 0.9 % 100 mL IVPB, 1 g, Intravenous, Q24H, Gouru, Aruna, MD, Last Rate: 200 mL/hr at 04/11/18 1536, 1 g at 04/11/18 1536 .  chlorhexidine gluconate (MEDLINE KIT) (PERIDEX) 0.12 % solution 15 mL, 15 mL, Mouth Rinse, BID, Darel Hong D, NP, 15 mL at 04/12/18 0748 .  DOPamine (INTROPIN) 800 mg in dextrose 5 % 250 mL (3.2 mg/mL) infusion, 0-20 mcg/kg/min, Intravenous, Titrated, Bradly Bienenstock, NP, Stopped at 04/10/18 [phone]  insulin aspart (novoLOG) injection 0-15 Units, 0-15 Units, Subcutaneous, Q4H, Darel Hong D, NP, 2 Units at 04/12/18 [phone] .  ipratropium-albuterol (DUONEB) 0.5-2.5 (3) MG/3ML nebulizer solution 3 mL, 3 mL, Nebulization, Q4H PRN, Darel Hong D, NP .  levothyroxine (SYNTHROID, LEVOTHROID) tablet 88 mcg, 88 mcg, Per Tube, Q0600, Rosine Door, MD .  MEDLINE mouth rinse, 15 mL, Mouth Rinse, 10 times per  day, Darel Hong D, NP, 15 mL at 04/12/18 1249 .  metroNIDAZOLE (FLAGYL) IVPB 500 mg, 500 mg, Intravenous, Q6H, Darel Hong D, NP, Last Rate: 100 mL/hr at 04/12/18 1220, 500 mg at 04/12/18 1220 .  midazolam (VERSED) 50 mg in sodium chloride 0.9 % 50 mL (1 mg/mL) infusion, 0.5-5 mg/hr, Intravenous, Titrated, Bradly Bienenstock, NP, Stopped at 04/12/18 1248 .  midazolam (VERSED) injection 2 mg,  2 mg, Intravenous, Q2H PRN, Darel Hong D, NP, 2 mg at 04/11/18 2218 .  norepinephrine (LEVOPHED) 16 mg in D5W 252m premix infusion, 0-40 mcg/min, Intravenous, Titrated, KDarel HongD, NP, Last Rate: 2.81 mL/hr at 0Feb 11, 20200600, 3 mcg/min at 002/11/200600 .  [COMPLETED] octreotide (SANDOSTATIN) 2 mcg/mL load via infusion 50 mcg, 50 mcg, Intravenous, Once, 50 mcg at 04/01/2018 1631 **AND** octreotide (SANDOSTATIN) 500 mcg in sodium chloride 0.9 % 250 mL (2 mcg/mL) infusion, 50 mcg/hr, Intravenous, Continuous, Veronese, CKentucky MD, Last Rate: 25 mL/hr at 02020/02/111043, 50 mcg/hr at 011-Feb-20201043 .  pantoprazole (PROTONIX) 80 mg in sodium chloride 0.9 % 250 mL (0.32 mg/mL) infusion, 8 mg/hr, Intravenous, Continuous, Veronese, CKentucky MD, Last Rate: 25 mL/hr at 011-Feb-20201045, 8 mg/hr at 002-11-20201045 .  [START ON 04/13/2018] pantoprazole (PROTONIX) injection 40 mg, 40 mg, Intravenous, Q12H, Veronese, CKentucky MD .  sodium chloride flush (NS) 0.9 % injection 10-40 mL, 10-40 mL, Intracatheter, Q12H, Kasa, Kurian, MD, 40 mL at 002/11/20200931 .  sodium chloride flush (NS) 0.9 % injection 10-40 mL, 10-40 mL, Intracatheter, PRN, KFlora Lipps MD      Indwelling Urinary Catheter continued, requirement due to   Reason to continue Indwelling Urinary Catheter for strict Intake/Output monitoring for hemodynamic instability   Central Line continued, requirement due to   Reason to continue CKinder Morgan EnergyMonitoring of central venous pressure or other hemodynamic parameters   Ventilator continued,  requirement due to, resp failure    Ventilator Sedation RASS 0 to -2    cxr LLL/retrocardiac infilteratevs atelactasis , stable  ASSESSMENT AND PLAN SYNOPSIS   Pt with cirrhosis/stage IV hepatocellular caner and renal failure.GIB s/p varices banding DNR but to cont present care. Palliative team/GI/Oncology team on consult.   Resp afilure/airway protection -continue Bronchodilator Therapy -Wean Fio2 and PEEP as tolerated -will perform SAT/SBT when respiratory parameters are met D/w family they are not ready for terminal extubation. We discussed the option of sedation weaning and SBT and they are in agreement to that. Will stop sedation and perform SBT. Follow ETS c/s   Liver failure with hep c /cirrhosis/portal HTN Cont lactulose for elevated ammonia S/P banding of esophageal varices /bleeding Cont IV PPI drip and octreotide. No further bleeding AST/ALT BETTER WITH INCREASED ALK PHOS  Mild AKI , better with IVF -follow chem 7 -follow UO -continue Foley Catheter-assess need daily IVF as tolerated   NEUROLOGY - intubated and sedated - minimal sedation to achieve a RASS goal: -1 Wake up assessment today  Hepatocellular ca stage IV  Sepsis/ID ? UTI/aspiration pneumonia -continue IV abx as prescribed. She is on ceftriaxone and flagyl -follow up cultures Wean levophed as tolerated by bp  GI/Nutrition GI PROPHYLAXIS as indicated DIET-->TF's as tolerated Constipation protocol as indicated  ENDO - ICU hypoglycemic\Hyperglycemia protocol -check FSBS per protocol   ELECTROLYTES -follow labs as needed -replace as needed -pharmacy consultation and following   ASSESS the need for LABS as needed  DNR  Critical Care Time devoted to patient care services described in this note is 35 minutes.     Overall, patient is critically ill, prognosis is guarded.  Patient with Multiorgan failure and at high risk for cardiac arrest and death.   KRosine Door MD  102/11/201:08 PM LVelora HecklerPulmonary & Critical Care Medicine

## 2018-04-13 LAB — CBC WITH DIFFERENTIAL/PLATELET
Abs Immature Granulocytes: 0.45 10*3/uL — ABNORMAL HIGH (ref 0.00–0.07)
Basophils Absolute: 0 10*3/uL (ref 0.0–0.1)
Basophils Relative: 0 %
Eosinophils Absolute: 0.2 10*3/uL (ref 0.0–0.5)
Eosinophils Relative: 2 %
HCT: 26 % — ABNORMAL LOW (ref 36.0–46.0)
Hemoglobin: 8.1 g/dL — ABNORMAL LOW (ref 12.0–15.0)
IMMATURE GRANULOCYTES: 4 %
LYMPHS ABS: 0.8 10*3/uL (ref 0.7–4.0)
Lymphocytes Relative: 6 %
MCH: 28.5 pg (ref 26.0–34.0)
MCHC: 31.2 g/dL (ref 30.0–36.0)
MCV: 91.5 fL (ref 80.0–100.0)
Monocytes Absolute: 2.2 10*3/uL — ABNORMAL HIGH (ref 0.1–1.0)
Monocytes Relative: 18 %
Neutro Abs: 8.5 10*3/uL — ABNORMAL HIGH (ref 1.7–7.7)
Neutrophils Relative %: 70 %
Platelets: 121 10*3/uL — ABNORMAL LOW (ref 150–400)
RBC: 2.84 MIL/uL — ABNORMAL LOW (ref 3.87–5.11)
RDW: 22.2 % — ABNORMAL HIGH (ref 11.5–15.5)
Smear Review: NORMAL
WBC: 12.2 10*3/uL — ABNORMAL HIGH (ref 4.0–10.5)
nRBC: 0.2 % (ref 0.0–0.2)

## 2018-04-13 LAB — COMPREHENSIVE METABOLIC PANEL
ALK PHOS: 646 U/L — AB (ref 38–126)
ALT: 247 U/L — ABNORMAL HIGH (ref 0–44)
AST: 408 U/L — ABNORMAL HIGH (ref 15–41)
Albumin: 1.9 g/dL — ABNORMAL LOW (ref 3.5–5.0)
Anion gap: 3 — ABNORMAL LOW (ref 5–15)
BUN: 44 mg/dL — ABNORMAL HIGH (ref 8–23)
CALCIUM: 5.6 mg/dL — AB (ref 8.9–10.3)
CO2: 20 mmol/L — ABNORMAL LOW (ref 22–32)
Chloride: 117 mmol/L — ABNORMAL HIGH (ref 98–111)
Creatinine, Ser: 1.06 mg/dL — ABNORMAL HIGH (ref 0.44–1.00)
GFR calc Af Amer: >60 mL/min (ref >60)
GFR calc non Af Amer: 56 mL/min — ABNORMAL LOW (ref >60)
GLUCOSE: 131 mg/dL — AB (ref 70–99)
Potassium: 4.1 mmol/L (ref 3.5–5.1)
Sodium: 140 mmol/L (ref 135–145)
Total Bilirubin: 5.9 mg/dL — ABNORMAL HIGH (ref 0.3–1.2)
Total Protein: 5.6 g/dL — ABNORMAL LOW (ref 6.5–8.1)

## 2018-04-13 LAB — AMMONIA: Ammonia: 69 umol/L — ABNORMAL HIGH (ref 9–35)

## 2018-04-13 LAB — GLUCOSE, CAPILLARY
GLUCOSE-CAPILLARY: 113 mg/dL — AB (ref 70–99)
Glucose-Capillary: 117 mg/dL — ABNORMAL HIGH (ref 70–99)
Glucose-Capillary: 137 mg/dL — ABNORMAL HIGH (ref 70–99)
Glucose-Capillary: 113 mg/dL — ABNORMAL HIGH (ref 70–99)
Glucose-Capillary: 124 mg/dL — ABNORMAL HIGH (ref 70–99)
Glucose-Capillary: 102 mg/dL — ABNORMAL HIGH (ref 70–99)

## 2018-04-13 MED ORDER — PANTOPRAZOLE SODIUM 40 MG IV SOLR
40.0000 mg | Freq: Every day | INTRAVENOUS | Status: DC
  Administered 2018-04-13 – 2018-04-14 (×2): 40 mg via INTRAVENOUS
  Filled 2018-04-13 (×2): qty 40

## 2018-04-13 MED ORDER — FUROSEMIDE 10 MG/ML IJ SOLN
20.0000 mg | Freq: Once | INTRAMUSCULAR | Status: AC
  Administered 2018-04-13: 20 mg via INTRAVENOUS
  Filled 2018-04-13: qty 2

## 2018-04-13 MED ORDER — CALCIUM GLUCONATE-NACL 1-0.675 GM/50ML-% IV SOLN
1.0000 g | INTRAVENOUS | Status: AC
  Administered 2018-04-13 (×2): 1000 mg via INTRAVENOUS
  Filled 2018-04-13 (×2): qty 50

## 2018-04-13 NOTE — Progress Notes (Signed)
Pt switched to cpap/ps tolerating well respiratory wise. Pt not following commands or opening eyes. Pt does withdraw from painful stimuli and pupils are reactive. Will continue to monitor closely.

## 2018-04-13 NOTE — Progress Notes (Signed)
CRITICAL CARE NOTE  CC  follow up respiratory failure Cirrhosis Liver cancer  SUBJECTIVE Patient remains critically ill Prognosis is guarded Off sedation since yesterday but still poorly responsive     SIGNIFICANT EVENTS None    BP (!) 117/49   Pulse 92   Temp 98.5 F (36.9 C) (Oral)   Resp (!) 21   Ht '5\' 3"'  (1.6 m)   Wt 81.1 kg   SpO2 97%   BMI 31.67 kg/m    REVIEW OF SYSTEMS  PATIENT IS UNABLE TO PROVIDE COMPLETE REVIEW OF SYSTEMS DUE TO SEVERE CRITICAL ILLNESS   PHYSICAL EXAMINATION:  GENERAL:critically ill appearing, noresp distress HEAD: Normocephalic, atraumatic.  EYES: Pupils equal, round, reactive to light.  No scleral icterus.  MOUTH: Moist mucosal membrane. NECK: Supple. No thyromegaly. No nodules. No JVD.  PULMONARY: no sig rhonci or wheezing CARDIOVASCULAR: S1 and S2. Regular rate and rhythm. No murmurs, rubs, or gallops.  GASTROINTESTINAL: Soft, nontender, -distended. No masses. Positive bowel sounds. No hepatosplenomegaly.  MUSCULOSKELETAL: No swelling, clubbing, ++ edema.  NEUROLOGIC: poorly responsive, SKIN:intact,warm,dry  INTAKE/OUTPUT  Intake/Output Summary (Last 24 hours) at 04/13/2018 0824 Last data filed at 04/13/2018 6967 Gross per 24 hour  Intake 1635.2 ml  Output 1450 ml  Net 185.2 ml    LABS  CBC Recent Labs  Lab 04/11/18 0517 04/12/18 0431 04/13/18 0441  WBC 25.8* 17.3* 12.2*  HGB 8.5* 8.4* 8.1*  HCT 27.0* 27.0* 26.0*  PLT 209 159 121*   Coag's Recent Labs  Lab 04/07/18 0837 04/21/2018 1446 04/12/2018 1636  APTT  --   --  31  INR 1.19 1.44 1.50   BMET Recent Labs  Lab 04/11/18 0517 04/12/18 0431 04/13/18 0441  NA 136 138 140  K 4.6 4.3 4.1  CL 111 115* 117*  CO2 20* 18* 20*  BUN 47* 48* 44*  CREATININE 1.16* 1.05* 1.06*  GLUCOSE 142* 139* 131*   Electrolytes Recent Labs  Lab 04/17/2018 2022 03/30/2018 2340  04/11/18 0517 04/12/18 0431 04/13/18 0441  CALCIUM 7.3*  --    < > 5.9* 5.5* 5.6*  MG 2.0  1.9  --   --   --   --    < > = values in this interval not displayed.   Sepsis Markers Recent Labs  Lab 04/02/2018 2022 04/07/2018 2148 04/10/18 0016 04/10/18 0448 04/10/18 0457 04/11/18 0517  LATICACIDVEN  --  6.4* 3.5*  --  2.5*  --   PROCALCITON 0.51  --   --  0.67  --  0.74   ABG Recent Labs  Lab 03/29/2018 2051 04/10/18 0417  PHART 7.28* 7.39  PCO2ART 44 35  PO2ART 147* 143*   Liver Enzymes Recent Labs  Lab 04/11/18 0517 04/12/18 0431 04/13/18 0441  AST 1,270* 740* 408*  ALT 456* 344* 247*  ALKPHOS 695* 717* 646*  BILITOT 5.3* 5.0* 5.9*  ALBUMIN 1.9* 2.1* 1.9*   Cardiac Enzymes Recent Labs  Lab 04/03/2018 2340  TROPONINI <0.03   Glucose Recent Labs  Lab 04/12/18 1213 04/12/18 1605 04/12/18 1932 04/12/18 2354 04/13/18 0400 04/13/18 0745  GLUCAP 128* 122* 115* 113* 117* 137*     Recent Results (from the past 240 hour(s))  MRSA PCR Screening     Status: None   Collection Time: 04/06/2018  7:25 PM  Result Value Ref Range Status   MRSA by PCR NEGATIVE NEGATIVE Final    Comment:        The GeneXpert MRSA Assay (FDA approved for NASAL specimens  only), is one component of a comprehensive MRSA colonization surveillance program. It is not intended to diagnose MRSA infection nor to guide or monitor treatment for MRSA infections. Performed at [facility], [address] [place], [place] [address]   Urine Culture     Status: None   Collection Time: 04/03/2018 11:40 PM  Result Value Ref Range Status   Specimen Description   Final    URINE, RANDOM Performed at [facility], [address] [place], [place] [address]    Special Requests   Final    Normal Performed at [facility], [address] [place], [place] [address]    Culture   Final    NO GROWTH Performed at [facility], [place] [address], [place], [place] [address]    Report Status 04/11/2018 FINAL  Final  CULTURE, BLOOD (ROUTINE X 2) w Reflex to ID Panel      Status: None (Preliminary result)   Collection Time: 04/10/18  1:05 AM  Result Value Ref Range Status   Specimen Description BLOOD BLOOD LEFT HAND  Final   Special Requests   Final    BOTTLES DRAWN AEROBIC AND ANAEROBIC Blood Culture adequate volume   Culture   Final    NO GROWTH 3 DAYS Performed at [facility], [address], [place], [place] [address]    Report Status PENDING  Incomplete  CULTURE, BLOOD (ROUTINE X 2) w Reflex to ID Panel     Status: None (Preliminary result)   Collection Time: 04/10/18  1:14 AM  Result Value Ref Range Status   Specimen Description BLOOD BLOOD LEFT FOREARM  Final   Special Requests   Final    BOTTLES DRAWN AEROBIC AND ANAEROBIC Blood Culture adequate volume   Culture   Final    NO GROWTH 3 DAYS Performed at [facility], [address], [place], [place] [address]    Report Status PENDING  Incomplete  Culture, respiratory (non-expectorated)     Status: None (Preliminary result)   Collection Time: 04/12/18 12:54 PM  Result Value Ref Range Status   Specimen Description   Final    TRACHEAL ASPIRATE Performed at [facility], [address], [place], [place] [address]    Special Requests   Final    NONE Performed at [facility], [address] [place], [place] [address]    Gram Stain   Final    NO WBC SEEN RARE YEAST Performed at [facility], [place] [address] [place], [place] [address]    Culture PENDING  Incomplete   Report Status PENDING  Incomplete    MEDICATIONS   Current Facility-Administered Medications:  .  0.9 %  sodium chloride infusion, , Intravenous, Continuous, Gouru, Aruna, MD, Stopped at 04/10/18 1351 .  cefTRIAXone (ROCEPHIN) 1 g in sodium chloride 0.9 % 100 mL IVPB, 1 g, Intravenous, Q24H, Gouru, Aruna, MD, Last Rate: 200 mL/hr at 04/12/18 1546, 1 g at 04/12/18 1546 .  chlorhexidine gluconate (MEDLINE KIT) (PERIDEX) 0.12 % solution 15 mL, 15 mL, Mouth Rinse, BID, Darel Hong D, NP, 15 mL at 04/13/18 0756 .  fentaNYL (SUBLIMAZE) injection 50 mcg, 50 mcg, Intravenous, Q1H PRN, Tukov-Yual, Magdalene S, NP, 50 mcg at 04/13/18 0308 .  insulin aspart (novoLOG) injection 0-15 Units, 0-15 Units, Subcutaneous, Q4H, Darel Hong D, NP, 2 Units at 04/13/18 0755 .  ipratropium-albuterol (DUONEB) 0.5-2.5 (3) MG/3ML nebulizer solution 3 mL, 3 mL, Nebulization, Q4H PRN, Darel Hong  D, NP .  levothyroxine (SYNTHROID, LEVOTHROID) tablet 88 mcg, 88 mcg, Per Tube, Q0600, Rosine Door, MD .  MEDLINE mouth rinse, 15 mL, Mouth Rinse, 10 times per day, Darel Hong D, NP, 15 mL at 29-Apr-2018 0552 .  metroNIDAZOLE (FLAGYL) IVPB 500 mg, 500 mg, Intravenous, Q6H, Darel Hong D, NP, Last Rate: 100 mL/hr at Apr 29, 2018 0600 .  midazolam (VERSED) 50 mg in sodium chloride 0.9 % 50 mL (1 mg/mL) infusion, 0.5-5 mg/hr, Intravenous, Titrated, Bradly Bienenstock, NP, Stopped at 04/12/18 1247 .  norepinephrine (LEVOPHED) 16 mg in D5W 224m premix infusion, 0-40 mcg/min, Intravenous, Titrated, KBradly Bienenstock NP, Stopped at 0February 04, [id number] .  [COMPLETED] octreotide (SANDOSTATIN) 2 mcg/mL load via infusion 50 mcg, 50 mcg, Intravenous, Once, 50 mcg at 04/18/2018 1631 **AND** octreotide (SANDOSTATIN) 500 mcg in sodium chloride 0.9 % 250 mL (2 mcg/mL) infusion, 50 mcg/hr, Intravenous, Continuous, Veronese, CKentucky MD, Last Rate: 25 mL/hr at 002/04/200600, 50 mcg/hr at 004-Feb-20200600 .  pantoprazole (PROTONIX) injection 40 mg, 40 mg, Intravenous, Daily, BRosine Door MD .  sodium chloride flush (NS) 0.9 % injection 10-40 mL, 10-40 mL, Intracatheter, Q12H, Kasa, Kurian, MD, 10 mL at 04/12/18 2200 .  sodium chloride flush (NS) 0.9 % injection 10-40 mL, 10-40 mL, Intracatheter, PRN, KFlora Lipps MD      Indwelling Urinary Catheter continued, requirement due to   Reason to continue Indwelling Urinary Catheter for strict Intake/Output monitoring for hemodynamic instability   Central Line  continued, requirement due to   Reason to continue CKinder Morgan EnergyMonitoring of central venous pressure or other hemodynamic parameters   Ventilator continued, requirement due to, resp failure    Ventilator Sedation RASS 0 to -2     ASSESSMENT AND PLAN SYNOPSIS  Pt with cirrhosis/stage IV hepatocellular caner and renal failure.GIB s/p varices banding DNR but to cont present care. Palliative team/GI/Oncology team on consult.   Resp Failure/airway protection -continue Bronchodilator Therapy -Wean Fio2 and PEEP as tolerated Tolerating cpap/ps this am D/w family they are not ready for terminal extubation. We discussed the option of sedation weaning and SBT and they are in agreement to that. Follow ETS c/s   Liver failure with hep c /cirrhosis/portal HTN Cont lactulose for elevated ammonia S/P banding of esophageal varices /bleeding Cont IV PPI drip and octreotide. No further bleeding AST/ALT BETTER WITH INCREASED ALK PHOS  Mild AKI , better with IVF -follow chem 7 -follow UO -continue Foley Catheter-assess need daily IVF as tolerated Lasix 20 mg today   NEUROLOGY - intubated and sedated - minimal sedation to achieve a RASS goal: -1 Wake up assessment today  Hepatocellular ca stage IV  Sepsis/ID? UTI/aspiration pneumonia -continue IV abx as prescribed. She is on ceftriaxone and flagyl -follow up cultures Wean levophed as tolerated by bp  GI/Nutrition GI PROPHYLAXIS as indicated DIET-->TF's as tolerated Constipation protocol as indicated  ENDO - ICU hypoglycemic\Hyperglycemia protocol -check FSBS per protocol   ELECTROLYTES -follow labs as needed -replace as needed -pharmacy consultation and following   ASSESS the need for LABS as needed  DNR Critical Care Time devoted to patient care services described in this note is 35 min minutes.   Overall, patient is critically ill, prognosis is guarded.  Patient with Multiorgan failure and at  high risk for cardiac arrest and death.   KRosine Door MD  12020-02-048:24 AM LVelora HecklerPulmonary & Critical Care Medicine

## 2018-04-13 NOTE — Progress Notes (Addendum)
CRITICAL VALUE ALERT  Critical Value: calcium 5.6  Date & Time Notied:  04/13/18 06:09   Provider Notified: Jeoffrey Massed, NP Orders Received/Actions taken: calcium replacement ordered..calcium gluconate 1g/62ml x2

## 2018-04-13 NOTE — Progress Notes (Signed)
[name] at [place] NAME: [name]    MR#:  [id number]  DATE OF BIRTH:  [birth year]  SUBJECTIVE:  CHIEF COMPLAINT:   Chief Complaint  Patient presents with  . GI Bleeding   --Remains intubated.  Off sedation since yesterday but poor neurological recovery so far -Off pressors.  Continue to monitor  REVIEW OF SYSTEMS:  Review of Systems  Unable to perform ROS: Critical illness    DRUG ALLERGIES:   Allergies  Allergen Reactions  . Demerol Anaphylaxis  . Other Other (See Comments)    general anesthesia - heart stopped   . Penicillins Anaphylaxis, Hives and Itching    Has patient had a PCN reaction causing immediate rash, facial/tongue/throat swelling, SOB or lightheadedness with hypotension: Yes Has patient had a PCN reaction causing severe rash involving mucus membranes or skin necrosis: Yes Has patient had a PCN reaction that required hospitalization: Yes Has patient had a PCN reaction occurring within the last 10 years: No If all of the above answers are "NO", then may proceed with Cephalosporin use.   Marland Kitchen Propofol Other (See Comments)    Allergic, caused her to code   . Latex Rash  . Oxycodone Hcl Rash    VITALS:  Blood pressure (!) 117/49, pulse 92, temperature 98.5 F (36.9 C), temperature source Oral, resp. rate (!) 21, height 5\' 3"  (1.6 m), weight 81.1 kg, SpO2 97 %.  PHYSICAL EXAMINATION:  Physical Exam  GENERAL:  63 y.o.-year-old patient lying in the bed, intubated and on vent.  Critically ill-appearing EYES: Pupils equal, round, reactive to light and accommodation.  Positive for scleral icterus. Extraocular muscles intact.  HEENT: Head atraumatic, normocephalic. Oropharynx and nasopharynx clear.  NECK:  Supple, no jugular venous distention. No thyroid enlargement, no tenderness.  LUNGS: Normal breath sounds bilaterally, no wheezing, rales,rhonchi or crepitation. No use of accessory muscles of respiration.   Decreased bibasilar breath sounds noted CARDIOVASCULAR: S1, S2 normal. No  rubs, or gallops. 2/6 systolic murmur present ABDOMEN: Soft, distended, hypoactive Bowel sounds present. No organomegaly or mass.  EXTREMITIES: No pedal edema, cyanosis, or clubbing.  NEUROLOGIC: Intubated.  Withdrawing to pain, not opening eyes or following commands PSYCHIATRIC: The patient is lethargic SKIN: No obvious rash, lesion, or ulcer.  Telangiectasias noted on the chest.  Palmar erythema present   LABORATORY PANEL:   CBC Recent Labs  Lab 04/13/18 0441  WBC 12.2*  HGB 8.1*  HCT 26.0*  PLT 121*   ------------------------------------------------------------------------------------------------------------------  Chemistries  Recent Labs  Lab 04/03/2018 2340  04/13/18 0441  NA  --    < > 140  K 5.6*   < > 4.1  CL  --    < > 117*  CO2  --    < > 20*  GLUCOSE  --    < > 131*  BUN  --    < > 44*  CREATININE  --    < > 1.06*  CALCIUM  --    < > 5.6*  MG 1.9  --   --   AST  --    < > 408*  ALT  --    < > 247*  ALKPHOS  --    < > 646*  BILITOT  --    < > 5.9*   < > = values in this interval not displayed.   ------------------------------------------------------------------------------------------------------------------  Cardiac Enzymes Recent Labs  Lab 04/03/2018 2340  TROPONINI <0.03   ------------------------------------------------------------------------------------------------------------------  RADIOLOGY:  Dg Chest Port 1 View  Result Date: 04/12/2018 CLINICAL DATA:  Evaluate for pneumonia. EXAM: PORTABLE CHEST 1 VIEW COMPARISON:  April 11, 2018 FINDINGS: The ETT terminates in good position, 5 cm above the carina. The right IJ is in good position in the SVC. No pneumothorax. No suspicious infiltrate to suggest pneumonia. No other interval change. IMPRESSION: 1. Support apparatus as above. 2. No convincing evidence of pneumonia. Mild atelectasis in the left base. Electronically Signed    By: Dorise Bullion III M.D   On: 04/12/2018 13:45    EKG:   Orders placed or performed during the hospital encounter of 04/14/2018  . ED EKG  . ED EKG  . EKG 12-Lead  . EKG 12-Lead    ASSESSMENT AND PLAN:   63 year old female with past medical history significant for hepatitis C, liver cirrhosis, stage IV hepatocellular carcinoma, history of esophageal varices, bipolar, ongoing alcohol use presents to hospital secondary to hematemesis.  1.  Hematemesis-GI bleed, seen by GI and patient had EGD on admission.  Showing actively bleeding esophageal varices that were banded and bleeding was stopped. -Continue to keep n.p.o., remains on octreotide  drip-likely can be DC'd today as it has been greater than 72 hours -Appreciate GI consult -Hemoglobin is stable.  2.  Acute respiratory failure-hypoxic respiratory failure secondary to aspiration pneumonia -Management per ICU team -Continue to wean FiO2 currently on 40%, spontaneous breathing trial when tolerated.  -Continue IV antibiotics-Rocephin and Flagyl  3.  Septic shock-secondary to aspiration pneumonia -Echocardiogram with normal EF of 55 to 27%-OZ systolic or diastolic dysfunction noted -off Levophed.  Also on IV antibiotics which will be continued  4.  Acute alcoholic liver failure-continue to monitor LFTs.  Slowly improving. -Has underlying hepatocellular carcinoma, ongoing alcohol use and alcoholic liver cirrhosis.  5.  Acute encephalopathy-likely metabolic encephalopathy given multiple medical problems and hypoxic respiratory failure. -Off sedation since yesterday.  Ammonia is improving.  Still no neurological recovery noted. -Recommend CT head for baseline evaluation at this time  6.  Hepatocellular carcinoma-appreciate oncology input.  Due to elevated AFP and even if patient survives this hospitalization, not a candidate for further ablation due to multiple comorbidities at this time. -Overall poor prognosis  7.  Acute renal  insufficiency-secondary to ATN from sepsis -Continue to monitor urine output.  8.  DVT prophylaxis-teds and SCDs  9.  Electrolyte abnormalities-pharmacy to replace the calcium   Critically ill-appearing.  Overall poor prognosis.  Appreciate palliative care consult. -Continue current level of care.  Even if recovers, will have a new baseline  All the records are reviewed and case discussed with Care Management/Social Workerr. Management plans discussed with the patient, family and they are in agreement.  CODE STATUS: DNR  TOTAL TIME TAKING CARE OF THIS PATIENT: 26 minutes.   POSSIBLE D/C IN ?  DAYS, DEPENDING ON CLINICAL CONDITION.   Gladstone Lighter M.D on 04/13/2018 at 8:31 AM  Between 7am to 6pm - Pager - [phone]  After 6pm go to [email or web address] - password [email or web address]  Office  [phone]  CC: Primary care physician; Jinny Sanders, MD

## 2018-04-13 NOTE — Progress Notes (Signed)
Pt's RR in the 40's and pt is setting off the vent. Respiratory called to bedside. Pt taken out of cpap/ps and RR now wnl. Family updated, will continue to monitor.

## 2018-04-13 NOTE — Progress Notes (Signed)
Pharmacy Electrolyte Monitoring Consult:  Pharmacy consulted to assist in monitoring and replacing electrolytes in this 63 y.o. female admitted on 04/20/2018 with GI Bleeding   Labs:  Sodium (mmol/L)  Date Value  04/13/2018 140  09/26/2011 142   Potassium (mmol/L)  Date Value  04/13/2018 4.1   Magnesium (mg/dL)  Date Value  04/10/2018 1.9   Calcium (mg/dL)  Date Value  04/13/2018 5.6 (LL)   Albumin  Date Value  04/13/2018 1.9 g/dL (L)  09/26/2011 4.1   Intubated, hx hepatocellular carcinoma, ongoing alcohol use and alcoholic liver cirrhosis.  Assessment/Plan: K 4.1  Ca 5.6  Alb 1.9   Albumin corrected Ca= 7.3 Scr 1.06     NP ordered Calcium gluconate 1 gram IV q1h x 2 doses this am. Will f/u electrolytes in am  Dalisa Forrer A 04/13/2018 11:18 AM

## 2018-04-14 DIAGNOSIS — Z7189 Other specified counseling: Secondary | ICD-10-CM

## 2018-04-14 DIAGNOSIS — C220 Liver cell carcinoma: Secondary | ICD-10-CM

## 2018-04-14 LAB — BASIC METABOLIC PANEL
Anion gap: 4 — ABNORMAL LOW (ref 5–15)
BUN: 46 mg/dL — AB (ref 8–23)
CO2: 20 mmol/L — ABNORMAL LOW (ref 22–32)
Calcium: 5.8 mg/dL — CL (ref 8.9–10.3)
Chloride: 121 mmol/L — ABNORMAL HIGH (ref 98–111)
Creatinine, Ser: 0.93 mg/dL (ref 0.44–1.00)
GFR calc Af Amer: >60 mL/min (ref >60)
GFR calc non Af Amer: >60 mL/min (ref >60)
Glucose, Bld: 126 mg/dL — ABNORMAL HIGH (ref 70–99)
Potassium: 4 mmol/L (ref 3.5–5.1)
SODIUM: 145 mmol/L (ref 135–145)

## 2018-04-14 LAB — GLUCOSE, CAPILLARY
Glucose-Capillary: 113 mg/dL — ABNORMAL HIGH (ref 70–99)
Glucose-Capillary: 111 mg/dL — ABNORMAL HIGH (ref 70–99)

## 2018-04-14 LAB — MAGNESIUM: Magnesium: 2.3 mg/dL (ref 1.7–2.4)

## 2018-04-14 MED ORDER — CALCIUM GLUCONATE-NACL 2-0.675 GM/100ML-% IV SOLN
2.0000 g | Freq: Once | INTRAVENOUS | Status: AC
  Administered 2018-04-14: 2000 mg via INTRAVENOUS
  Filled 2018-04-14: qty 100

## 2018-04-14 MED ORDER — SODIUM CHLORIDE 0.9 % IV SOLN
1.0000 g | INTRAVENOUS | Status: DC
  Filled 2018-04-14: qty 10

## 2018-04-14 MED ORDER — FENTANYL CITRATE (PF) 100 MCG/2ML IJ SOLN
25.0000 ug | INTRAMUSCULAR | Status: DC | PRN
  Administered 2018-04-14 (×2): 100 ug via INTRAVENOUS
  Administered 2018-04-14: 50 ug via INTRAVENOUS
  Administered 2018-04-14 (×2): 100 ug via INTRAVENOUS
  Administered 2018-04-15: 13:00:00 50 ug via INTRAVENOUS
  Filled 2018-04-14 (×5): qty 2

## 2018-04-14 MED ORDER — STERILE WATER FOR INJECTION IJ SOLN
INTRAMUSCULAR | Status: AC
  Administered 2018-04-14: 10:00:00
  Filled 2018-04-14: qty 10

## 2018-04-14 MED ORDER — FENTANYL 2500MCG IN NS 250ML (10MCG/ML) PREMIX INFUSION
0.0000 ug/h | INTRAVENOUS | Status: DC
  Administered 2018-04-14: 35 ug/h via INTRAVENOUS
  Filled 2018-04-14: qty 250

## 2018-04-14 MED ORDER — GLYCOPYRROLATE 1 MG PO TABS
1.0000 mg | ORAL_TABLET | ORAL | Status: DC | PRN
  Filled 2018-04-14: qty 1

## 2018-04-14 MED ORDER — GLYCOPYRROLATE 0.2 MG/ML IJ SOLN
0.2000 mg | INTRAMUSCULAR | Status: DC | PRN
  Filled 2018-04-14: qty 1

## 2018-04-14 MED ORDER — POLYVINYL ALCOHOL 1.4 % OP SOLN
1.0000 [drp] | Freq: Four times a day (QID) | OPHTHALMIC | Status: DC | PRN
  Filled 2018-04-14: qty 15

## 2018-04-14 MED ORDER — GLYCOPYRROLATE 0.2 MG/ML IJ SOLN
0.2000 mg | INTRAMUSCULAR | Status: DC | PRN
  Administered 2018-04-14: 0.2 mg via INTRAVENOUS
  Filled 2018-04-14 (×2): qty 1

## 2018-04-14 MED ORDER — DIPHENHYDRAMINE HCL 50 MG/ML IJ SOLN
25.0000 mg | INTRAMUSCULAR | Status: DC | PRN
  Filled 2018-04-14: qty 0.5

## 2018-04-14 NOTE — Progress Notes (Signed)
[facility] [place] NAME: [name]    MR#:  [id number]  DATE OF BIRTH:  13-Feb-1956  SUBJECTIVE:  CHIEF COMPLAINT:   Chief Complaint  Patient presents with  . GI Bleeding   --Remains about the same, intubated.  Off sedation for almost 2 days now with no neurological recovery yet -Could not tolerate spontaneous as increased respiratory rate  REVIEW OF SYSTEMS:  Review of Systems  Unable to perform ROS: Critical illness    DRUG ALLERGIES:   Allergies  Allergen Reactions  . Demerol Anaphylaxis  . Other Other (See Comments)    general anesthesia - heart stopped   . Penicillins Anaphylaxis, Hives and Itching    Has patient had a PCN reaction causing immediate rash, facial/tongue/throat swelling, SOB or lightheadedness with hypotension: Yes Has patient had a PCN reaction causing severe rash involving mucus membranes or skin necrosis: Yes Has patient had a PCN reaction that required hospitalization: Yes Has patient had a PCN reaction occurring within the last 10 years: No If all of the above answers are "NO", then may proceed with Cephalosporin use.   Marland Kitchen Propofol Other (See Comments)    Allergic, caused her to code   . Latex Rash  . Oxycodone Hcl Rash    VITALS:  Blood pressure (!) 112/48, pulse 89, temperature (!) 97.5 F (36.4 C), temperature source Tympanic, resp. rate 18, height 5\' 3"  (1.6 m), weight 81.1 kg, SpO2 94 %.  PHYSICAL EXAMINATION:  Physical Exam  GENERAL:  63 y.o.-year-old patient lying in the bed, intubated and on vent.  Critically ill-appearing EYES: Pupils equal, round, reactive to light and accommodation.  Positive for scleral icterus. Extraocular muscles intact.  HEENT: Head atraumatic, normocephalic. Oropharynx and nasopharynx clear.  NECK:  Supple, no jugular venous distention. No thyroid enlargement, no tenderness.  LUNGS: Normal breath sounds bilaterally, no wheezing, rales,rhonchi or crepitation. No  use of accessory muscles of respiration.  Decreased bibasilar breath sounds noted CARDIOVASCULAR: S1, S2 normal. No  rubs, or gallops. 2/6 systolic murmur present ABDOMEN: Soft, distended, hypoactive Bowel sounds present. No organomegaly or mass.  EXTREMITIES: No pedal edema, cyanosis, or clubbing.  NEUROLOGIC: Intubated.  Withdrawing to pain, not opening eyes or following commands PSYCHIATRIC: The patient is lethargic SKIN: No obvious rash, lesion, or ulcer.  Telangiectasias noted on the chest.  Palmar erythema present   LABORATORY PANEL:   CBC Recent Labs  Lab 04/13/18 0441  WBC 12.2*  HGB 8.1*  HCT 26.0*  PLT 121*   ------------------------------------------------------------------------------------------------------------------  Chemistries  Recent Labs  Lab 04/13/18 0441 04/14/18 0610  NA 140 145  K 4.1 4.0  CL 117* 121*  CO2 20* 20*  GLUCOSE 131* 126*  BUN 44* 46*  CREATININE 1.06* 0.93  CALCIUM 5.6* 5.8*  MG  --  2.3  AST 408*  --   ALT 247*  --   ALKPHOS 646*  --   BILITOT 5.9*  --    ------------------------------------------------------------------------------------------------------------------  Cardiac Enzymes Recent Labs  Lab 03/30/2018 2340  TROPONINI <0.03   ------------------------------------------------------------------------------------------------------------------  RADIOLOGY:  Dg Chest Port 1 View  Result Date: 04/12/2018 CLINICAL DATA:  Evaluate for pneumonia. EXAM: PORTABLE CHEST 1 VIEW COMPARISON:  April 11, 2018 FINDINGS: The ETT terminates in good position, 5 cm above the carina. The right IJ is in good position in the SVC. No pneumothorax. No suspicious infiltrate to suggest pneumonia. No other interval change. IMPRESSION: 1. Support apparatus as above. 2. No convincing  evidence of pneumonia. Mild atelectasis in the left base. Electronically Signed   By: Dorise Bullion III M.D   On: 04/12/2018 13:45    EKG:   Orders placed or  performed during the hospital encounter of 03/30/2018  . ED EKG  . ED EKG  . EKG 12-Lead  . EKG 12-Lead    ASSESSMENT AND PLAN:   63 year old female with past medical history significant for hepatitis C, liver cirrhosis, stage IV hepatocellular carcinoma, history of esophageal varices, bipolar, ongoing alcohol use presents to hospital secondary to hematemesis.  1.  Hematemesis-GI bleed, seen by GI and patient had EGD on admission.  Showing actively bleeding esophageal varices that were banded and bleeding was stopped. -Continue to keep n.p.o., remains on octreotide  drip-likely can be DC'd today as it has been greater than 72 hours since variceal banding -Appreciate GI consult -Hemoglobin is stable.  2.  Acute respiratory failure-hypoxic respiratory failure secondary to aspiration pneumonia -Management per ICU team -Currently on 40% FiO2, unable to tolerate spontaneous breathing trial..  -on IV antibiotics-Rocephin and Flagyl  3.  Septic shock-secondary to aspiration pneumonia -Echocardiogram with normal EF of 55 to 31%-VQ systolic or diastolic dysfunction noted -off Levophed.  Also on IV antibiotics  4.  Acute alcoholic liver failure-continue to monitor LFTs.  Slowly improving. -Has underlying hepatocellular carcinoma, ongoing alcohol use and alcoholic liver cirrhosis.  5.  Acute encephalopathy-likely metabolic encephalopathy given multiple medical problems and hypoxic respiratory failure. -Off sedation since yesterday.  Ammonia is improving.  Still no neurological recovery noted. -Recommend CT head for baseline evaluation at this time  6.  Hepatocellular carcinoma-appreciate oncology input.  Due to elevated AFP and even if patient survives this hospitalization, not a candidate for further ablation due to multiple comorbidities at this time. -Overall poor prognosis  7.  Acute renal insufficiency-secondary to ATN from sepsis -Continue to monitor urine output.  8.  DVT  prophylaxis-teds and SCDs  9.  Electrolyte abnormalities-pharmacy to replace the calcium   Critically ill-appearing.  Overall poor prognosis.  Appreciate palliative care consult. -Continue current level of care.  Even if recovers, will have a new baseline  All the records are reviewed and case discussed with Care Management/Social Workerr. Management plans discussed with the patient, family and they are in agreement.  CODE STATUS: DNR  TOTAL TIME TAKING CARE OF THIS PATIENT: 26 minutes.   POSSIBLE D/C IN ?  DAYS, DEPENDING ON CLINICAL CONDITION.   Gladstone Lighter M.D on 04/14/2018 at 8:31 AM  Between 7am to 6pm - Pager - [phone]  After 6pm go to [email or web address] - password [email or web address]  Office  [phone]  CC: Primary care physician; Jinny Sanders, MD

## 2018-04-14 NOTE — Progress Notes (Signed)
Ch provided spiritual and emotional support to family of pt that had the ventilator removed. Family were in a high state of emotional distress but were thankful for the care we provided. Ch highly recommended f/u from another ch to attend to the family.    04/14/18 1125  Clinical Encounter Type  Visited With Patient and family together  Visit Type Spiritual support;Social support;Patient actively dying  Referral From Nurse  Consult/Referral To Chaplain  Spiritual Encounters  Spiritual Needs Emotional;Grief support  Stress Factors  Patient Stress Factors Exhausted;Loss;Loss of control;Major life changes  Family Stress Factors Loss of control;Loss;Major life changes

## 2018-04-14 NOTE — Progress Notes (Signed)
Family is ready to proceed with terminal extubation which is very appropriate given ESLD and untreatable hepatocellular carcinoma.  I spoke at length with them regarding the process of terminal extubation and comfort care.  All questions were answered.  I checked on the patient after extubation and she was comfortable.  Presently, she is only receiving intermittent morphine.  Merton Border, MD PCCM service Mobile [phone] Pager [phone] 04/14/2018 3:00 PM

## 2018-04-14 NOTE — Progress Notes (Signed)
Pt extubated per MD order, pt placed on 6LHFNC with sats in mid 90's.

## 2018-04-14 NOTE — Progress Notes (Signed)
[place]  Telephone:([phone] [phone]   Name: [name] Date: 04/14/2018 MRN: [id number]  DOB: [birth year]  Patient Care Team: Jinny Sanders, MD as PCP - General (Family Medicine) Clent Jacks, RN as Registered Nurse Lloyd Huger, MD as Medical Oncologist (Medical Oncology)    REASON FOR CONSULTATION: Palliative Care consult requested for this 63 y.o. female with multiple medical problems including stage IV hepatocellular carcinoma, child Pugh class C cirrhosis, history of hepatitis C status post viral treatment, who was admitted to the hospital on 03/30/2018 with upper GI bleed.  Patient underwent EGD and was found to have grade 1 esophageal varices with successful banding.  Unfortunately patient was unable to be extubated following the procedure.  She is suspected to have aspiration pneumonia. Palliative care was consulted to help address goals.   CODE STATUS: DNR  PAST MEDICAL HISTORY: Past Medical History:  Diagnosis Date  . Acquired hypothyroidism    thyroid goiter s/p thyroidectomy  . Allergy   . Bipolar 1 disorder ([place]) 1990s  . Chronic hepatitis C (HCC)    genotype 1a  . Compensated HCV cirrhosis ([place])   . Complication of anesthesia    states 15 yrs ago, hard time waking up, states cant breathe when coming out of anesthesia  . Complication of anesthesia    per patient heart stopped  . COPD (chronic obstructive pulmonary disease) ([place])   . Depression    states seen weekly at [facility]  . GERD (gastroesophageal reflux disease)    Hpylori + and treated (2012)  . Hepatitis C   . History of alcohol abuse   . Insomnia   . Migraines   . Pneumonia   . PONV (postoperative nausea and vomiting)    "it all depends on what kind they give me"  . Portal hypertension ([place])   . Portal hypertensive gastropathy ([place]) 2013  . Smoker   . Stroke ([place])   . TIA (transient ischemic attack)   .  Varicose veins     PAST SURGICAL HISTORY:  Past Surgical History:  Procedure Laterality Date  . APPENDECTOMY    . DIAGNOSTIC LAPAROSCOPY     15 yrs ago  . ELECTROMAGNETIC NAVIGATION BROCHOSCOPY N/A 08/15/2017   Procedure: ELECTROMAGNETIC NAVIGATION BRONCHOSCOPY;  Surgeon: Flora Lipps, MD;  Location: [facility] ORS;  Service: Cardiopulmonary;  Laterality: N/A;  . ESOPHAGOGASTRODUODENOSCOPY  10/2010   nl esophagus, HH, portal hypertensive gastropathy, erosive gastropathy, Hpylori + (Dr. Sydnee Levans)  . ESOPHAGOGASTRODUODENOSCOPY (EGD) WITH PROPOFOL N/A 04/24/2018   Procedure: ESOPHAGOGASTRODUODENOSCOPY (EGD) WITH PROPOFOL;  Surgeon: Toledo, Benay Pike, MD;  Location: [facility] ENDOSCOPY;  Service: Gastroenterology;  Laterality: N/A;  . foot surgery Bilateral 2017   Callus removal  . IR RADIOLOGIST EVAL & MGMT  02/19/2018  . left foot surgery  1990s  . LIVER BIOPSY    . THYROIDECTOMY  05/17/2011   Procedure: TOTAL THYROIDECTOMY;  Surgeon: Earnstine Regal, MD, benign goiter per path report    HEMATOLOGY/ONCOLOGY HISTORY:    Hepatocellular carcinoma ([place])   01/26/2018 Initial Diagnosis    Hepatocellular carcinoma ([place])    01/28/2018 Cancer Staging    Staging form: Liver, AJCC 8th Edition - Clinical stage from 01/28/2018: Stage IIIA (cT3, cN0, cM0) - Signed by Lloyd Huger, MD on 01/28/2018     ALLERGIES:  is allergic to demerol; other; penicillins; propofol; latex; and oxycodone hcl.  MEDICATIONS:  Current Facility-Administered Medications  Medication Dose Route Frequency Provider  Last Rate Last Dose  . diphenhydrAMINE (BENADRYL) injection 25 mg  25 mg Intravenous Q4H PRN Wilhelmina Mcardle, MD      . fentaNYL (SUBLIMAZE) injection 25-100 mcg  25-100 mcg Intravenous Q30 min PRN Wilhelmina Mcardle, MD   50 mcg at 04/14/18 1211  . glycopyrrolate (ROBINUL) tablet 1 mg  1 mg Oral Q4H PRN Wilhelmina Mcardle, MD       Or  . glycopyrrolate (ROBINUL) injection 0.2 mg  0.2 mg Subcutaneous Q4H PRN Wilhelmina Mcardle, MD       Or  . glycopyrrolate (ROBINUL) injection 0.2 mg  0.2 mg Intravenous Q4H PRN Wilhelmina Mcardle, MD   0.2 mg at 04/14/18 1210  . polyvinyl alcohol (LIQUIFILM TEARS) 1.4 % ophthalmic solution 1 drop  1 drop Both Eyes QID PRN Wilhelmina Mcardle, MD        VITAL SIGNS: BP (!) 124/56   Pulse 93   Temp (!) 97.5 F (36.4 C) (Tympanic)   Resp (!) 21   Ht 5\' 3"  (1.6 m)   Wt 192 lb 0.3 oz (87.1 kg)   SpO2 97%   BMI 34.01 kg/m  Filed Weights   04/12/18 1744 04/13/18 0400 04/14/18 0800  Weight: 189 lb 9.5 oz (86 kg) 178 lb 12.7 oz (81.1 kg) 192 lb 0.3 oz (87.1 kg)    Estimated body mass index is 34.01 kg/m as calculated from the following:   Height as of this encounter: 5\' 3"  (1.6 m).   Weight as of this encounter: 192 lb 0.3 oz (87.1 kg).  LABS: CBC:    Component Value Date/Time   WBC 12.2 (H) 04/13/2018 0441   HGB 8.1 (L) 04/13/2018 0441   HCT 26.0 (L) 04/13/2018 0441   PLT 121 (L) 04/13/2018 0441   MCV 91.5 04/13/2018 0441   NEUTROABS 8.5 (H) 04/13/2018 0441   LYMPHSABS 0.8 04/13/2018 0441   MONOABS 2.2 (H) 04/13/2018 0441   EOSABS 0.2 04/13/2018 0441   BASOSABS 0.0 04/13/2018 0441   Comprehensive Metabolic Panel:    Component Value Date/Time   NA 145 04/14/2018 0610   NA 142 09/26/2011   K 4.0 04/14/2018 0610   CL 121 (H) 04/14/2018 0610   CO2 20 (L) 04/14/2018 0610   BUN 46 (H) 04/14/2018 0610   BUN 11 09/26/2011   CREATININE 0.93 04/14/2018 0610   CREATININE 0.65 01/27/2015 1719   GLUCOSE 126 (H) 04/14/2018 0610   CALCIUM 5.8 (LL) 04/14/2018 0610   AST 408 (H) 04/13/2018 0441   AST 137 09/26/2011   ALT 247 (H) 04/13/2018 0441   ALKPHOS 646 (H) 04/13/2018 0441   ALKPHOS 74 09/26/2011   BILITOT 5.9 (H) 04/13/2018 0441   BILITOT 1.2 09/26/2011   PROT 5.6 (L) 04/13/2018 0441   PROT 7.8 05/22/2017 1054   ALBUMIN 1.9 (L) 04/13/2018 0441   ALBUMIN 4.1 09/26/2011    RADIOGRAPHIC STUDIES: Ct Abdomen Pelvis Wo Contrast  Result Date:  03/30/2018 CLINICAL DATA:  reports vomiting dark red blood, states four episodes today. Denies hx of same, denies blood thinners, denies alcohol use. Pt tachypneic, appears short of breath. Pt is a cancer patient, currently being treated for liver cancer. Pt abdomen very taut and distended. EXAM: CT ABDOMEN AND PELVIS WITHOUT CONTRAST TECHNIQUE: Multidetector CT imaging of the abdomen and pelvis was performed following the standard protocol without IV contrast. COMPARISON:  PET-CT, 01/31/2018 FINDINGS: Lower chest: No acute findings. Hepatobiliary: Liver shows extensive nodularity. There is enlargement of the inferior  aspect of the right lobe and lateral segment of the left lobe. Subtle hypoattenuating areas are noted in the liver, poorly defined on this unenhanced study, consistent with the known multifocal hepatocellular carcinoma. Gallbladder is unremarkable.  No bile duct dilation. Pancreas: Unremarkable. No pancreatic ductal dilatation or surrounding inflammatory changes. Spleen: Spleen borderline enlarged measuring 12.3 cm in greatest dimension, stable from the prior PET-CT. No splenic mass or focal lesion. Adrenals/Urinary Tract: No adrenal masses. Kidneys normal size, orientation and position. No renal masses or stones. No hydronephrosis. Ureters normal in course and in caliber. Bladder is unremarkable. Stomach/Bowel: Small hiatal hernia. Stomach is otherwise unremarkable. Small bowel is normal in caliber with no wall thickening or adjacent inflammation. Colon is normal in caliber. No wall thickening or convincing inflammation. Vascular/Lymphatic: There are numerous venous collaterals in the upper abdomen, with prominent paraesophageal varices. These are stable from the prior PET-CT. There are dense atherosclerotic calcifications along a normal caliber abdominal aorta. There are multiple prominent and enlarged upper abdominal lymph nodes along the peri celiac and gastrohepatic ligaments, stable from the  prior PET-CT. Reproductive: Uterus and bilateral adnexa are unremarkable. Other: There is now a small amount of ascites collecting adjacent to the liver, and tracking into the pelvis collecting in the pelvic recesses. Musculoskeletal: No fracture or acute finding. No osteoblastic or osteolytic lesions. IMPRESSION: 1. No acute findings within the abdomen or pelvis. 2. Advanced cirrhosis with portal venous hypertension. The multifocal hepatocellular carcinoma noted on the prior PET-CT is not well-defined on this unenhanced study, but shows no convincing change. 3. Prominent and enlarged upper abdominal lymph nodes, stable from the prior PET-CT. 4. Since the prior PET-CT, a small amount of ascites has developed. Electronically Signed   By: Lajean Manes M.D.   On: 04/08/2018 15:33   Dg Chest 1 View  Result Date: 04/10/2018 CLINICAL DATA:  Central line placement EXAM: CHEST  1 VIEW COMPARISON:  04/15/2018 at 1920 hours FINDINGS: Right IJ venous catheter terminates in the mid SVC. Endotracheal tube terminates 4 cm above the carina. Mild hazy lingular opacity, new. No pleural effusion or pneumothorax. The heart is normal in size. IMPRESSION: Right IJ venous catheter terminates in the mid SVC. Electronically Signed   By: Julian Hy M.D.   On: 04/10/2018 00:17   Dg Chest 2 View  Result Date: 03/21/2018 CLINICAL DATA:  Community acquired pneumonia. EXAM: CHEST - 2 VIEW COMPARISON:  Chest x-ray July 15, 2017, PET-CT January 31, 2018 FINDINGS: The heart size and mediastinal contours are within normal limits. There is a 2 cm nodule in the left upper lobe described on previous PET-CT. There is no focal pneumonia, pulmonary edema, or pleural effusion. The visualized skeletal structures are unremarkable. IMPRESSION: 2 cm nodule in the left upper lobe described on the recent previous PET-CT. Neoplasm is not excluded. No focal pneumonia is identified. Electronically Signed   By: Abelardo Diesel M.D.   On: 03/21/2018  19:18   Dg Abd 1 View  Result Date: 03/21/2018 CLINICAL DATA:  Abdominal pain, distention, nausea, vomiting x3-4 months, worse x2-3 days. Hx of hepatitis, GERD, HTN. EXAM: ABDOMEN - 1 VIEW COMPARISON:  PET-CT on 01/31/2018 FINDINGS: Spleen is enlarged. Bowel gas pattern is nonobstructive. No suspicious lytic or blastic lesions are identified. IMPRESSION: Nonobstructed bowel-gas pattern. Splenomegaly. Electronically Signed   By: Nolon Nations M.D.   On: 03/21/2018 14:05   US Abdomen Limited  Result Date: 03/22/2018 CLINICAL DATA:  64 year old with history of hepatitis-C, cirrhosis and hepatocellular carcinoma. Evaluate  for ascites and spontaneous bacterial peritonitis. EXAM: LIMITED ABDOMEN ULTRASOUND FOR ASCITES TECHNIQUE: Limited ultrasound survey for ascites was performed in all four abdominal quadrants. COMPARISON:  PET-CT 01/31/2018 FINDINGS: Spleen is prominent for size without surrounding fluid. There is no ascites identified in the left upper quadrant, left lower quadrant or right lower quadrant. Trace perihepatic ascites near the dome. Liver is heterogeneous and compatible with cirrhosis. IMPRESSION: No significant ascites. Trace ascites around the liver. Paracentesis not performed. Electronically Signed   By: Markus Daft M.D.   On: 03/22/2018 15:55   Dg Chest Port 1 View  Result Date: 04/12/2018 CLINICAL DATA:  Evaluate for pneumonia. EXAM: PORTABLE CHEST 1 VIEW COMPARISON:  April 11, 2018 FINDINGS: The ETT terminates in good position, 5 cm above the carina. The right IJ is in good position in the SVC. No pneumothorax. No suspicious infiltrate to suggest pneumonia. No other interval change. IMPRESSION: 1. Support apparatus as above. 2. No convincing evidence of pneumonia. Mild atelectasis in the left base. Electronically Signed   By: Dorise Bullion III M.D   On: 04/12/2018 13:45   Dg Chest Port 1 View  Result Date: 04/11/2018 CLINICAL DATA:  Acute respiratory failure EXAM: PORTABLE  CHEST 1 VIEW COMPARISON:  04/10/2018 FINDINGS: Endotracheal tube terminates 4 cm above the carina. Right IJ venous catheter terminates in the lower SVC. Mild left lower lobe opacity, likely atelectasis. Right lung is clear. No pleural effusion or pneumothorax. The heart is normal in size. Surgical clips along the bilateral neck. IMPRESSION: Endotracheal tube terminates 4 cm above the carina. Mild left lower lobe opacity, likely atelectasis. Electronically Signed   By: Julian Hy M.D.   On: 04/11/2018 03:36   Portable Chest Xray  Result Date: 04/10/2018 CLINICAL DATA:  Endotracheal tube present EXAM: PORTABLE CHEST 1 VIEW COMPARISON:  Yesterday FINDINGS: Endotracheal tube tip between the clavicular heads and carina. Right IJ line with tip at the SVC. Retrocardiac airspace disease. No edema, effusion, or pneumothorax. Normal heart size. IMPRESSION: 1. Stable hardware positioning. 2. Retrocardiac atelectasis or infection. Electronically Signed   By: Monte Fantasia M.D.   On: 04/10/2018 06:28   Portable Chest X-ray  Result Date: 03/30/2018 CLINICAL DATA:  Evaluate endotracheal tube. EXAM: PORTABLE CHEST 1 VIEW COMPARISON:  03/21/2018 FINDINGS: Endotracheal tube tip projects approximately 4 mm above the Carina, pointed towards the right mainstem bronchus orifice. Cardiac silhouette is normal in size. No mediastinal or hilar masses. Lungs show chronically prominent bronchovascular markings. No evidence of pneumonia or pulmonary edema. No pleural effusion or pneumothorax. Multiple surgical vascular clips at the neck base consistent with a prior thyroidectomy. IMPRESSION: 1. Endotracheal tube tip projects low, only approximately 4 mm above the inferior margin of the Carina, angled towards the right mainstem bronchus. Consider retracting 1 to 1.5 cm. 2. No acute cardiopulmonary disease. Electronically Signed   By: Lajean Manes M.D.   On: 04/08/2018 19:39   Dg Abd 2 Views  Result Date: 04/08/2018 CLINICAL  DATA:  63 year old with hepatic cirrhosis and hepatocellular carcinoma due to chronic hepatitis C presenting with constipation for approximately 3 weeks. EXAM: ABDOMEN - 2 VIEW COMPARISON:  03/21/2018 and earlier, including PET-CT 01/31/2018. FINDINGS: Bowel gas pattern unremarkable without evidence of obstruction or significant ileus. No evidence of air-fluid levels or free intraperitoneal air on the ERECT image. Expected stool burden in the colon. No visible urinary tract calculi. Splenomegaly, as the inferior tip of the spleen extends nearly to the pelvic brim. Degenerative changes involving the lumbar spine.  IMPRESSION: 1. No acute abdominal abnormality. 2. Splenomegaly, as the inferior tip of the spleen extends nearly to the pelvic brim. Electronically Signed   By: Evangeline Dakin M.D.   On: 04/08/2018 09:00    PERFORMANCE STATUS (ECOG) : 4 - Bedbound  Review of Systems Unable to provide.  Physical Exam General: critically ill appearing Pulmonary: unlabored GU: foley Skin: no rashes Neurological: unresponsive  IMPRESSION: Patient remains in ICU. She was extubated this AM to comfort care. Patient is currently comfortable appearing. Family at bedside. Emotional support provided to family.   Anticipate in hospital death.   Case discussed with Dr. Grayland Ormond and nursing staff.   PLAN: Comfort care   Time Total: 15 minutes  Visit consisted of counseling and education dealing with the complex and emotionally intense issues of symptom management and palliative care in the setting of serious and potentially life-threatening illness.Greater than 50%  of this time was spent counseling and coordinating care related to the above assessment and plan.  Signed by: Altha Harm, PhD, NP-C [phone] (Work Cell)

## 2018-04-15 LAB — CULTURE, BLOOD (ROUTINE X 2)
Culture: NO GROWTH
Culture: NO GROWTH
Special Requests: ADEQUATE
Special Requests: ADEQUATE

## 2018-04-15 LAB — CULTURE, RESPIRATORY W GRAM STAIN: Gram Stain: NONE SEEN

## 2018-04-15 NOTE — Progress Notes (Signed)
Patient repositioned and mouth care done.  Clarise Cruz, BSN

## 2018-04-15 NOTE — Care Management Important Message (Signed)
Important Message  Patient Details  Name: [name] MRN: [id number] Date of Birth: 05-03-1955   Medicare Important Message Given:  Yes    Juliann Pulse A Saprina Chuong 04/15/2018, 11:40 AM

## 2018-04-15 NOTE — Progress Notes (Signed)
[place]  [phone] [phone]   Name: [name] Date: 04/15/2018 MRN: [id number]  DOB: March 16, 1956  Patient Care Team: Jinny Sanders, MD as PCP - General (Family Medicine) Clent Jacks, RN as Registered Nurse Lloyd Huger, MD as Medical Oncologist (Medical Oncology)    REASON FOR CONSULTATION: Palliative Care consult requested for this 63 y.o. female with multiple medical problems including stage IV hepatocellular carcinoma, child Pugh class C cirrhosis, history of hepatitis C status post viral treatment, who was admitted to the hospital on 04/08/2018 with upper GI bleed.  Patient underwent EGD and was found to have grade 1 esophageal varices with successful banding.  Unfortunately patient was unable to be extubated following the procedure.  She is suspected to have aspiration pneumonia. Palliative care was consulted to help address goals.   CODE STATUS: DNR  PAST MEDICAL HISTORY: Past Medical History:  Diagnosis Date  . Acquired hypothyroidism    thyroid goiter s/p thyroidectomy  . Allergy   . Bipolar 1 disorder ([place]) 1990s  . Chronic hepatitis C (HCC)    genotype 1a  . Compensated HCV cirrhosis ([place])   . Complication of anesthesia    states 15 yrs ago, hard time waking up, states cant breathe when coming out of anesthesia  . Complication of anesthesia    per patient heart stopped  . COPD (chronic obstructive pulmonary disease) ([place])   . Depression    states seen weekly at [facility]  . GERD (gastroesophageal reflux disease)    Hpylori + and treated (2012)  . Hepatitis C   . History of alcohol abuse   . Insomnia   . Migraines   . Pneumonia   . PONV (postoperative nausea and vomiting)    "it all depends on what kind they give me"  . Portal hypertension ([place])   . Portal hypertensive gastropathy ([place]) 2013  . Smoker   . Stroke ([place])   . TIA (transient ischemic attack)   .  Varicose veins     PAST SURGICAL HISTORY:  Past Surgical History:  Procedure Laterality Date  . APPENDECTOMY    . DIAGNOSTIC LAPAROSCOPY     15 yrs ago  . ELECTROMAGNETIC NAVIGATION BROCHOSCOPY N/A 08/15/2017   Procedure: ELECTROMAGNETIC NAVIGATION BRONCHOSCOPY;  Surgeon: Flora Lipps, MD;  Location: [facility] ORS;  Service: Cardiopulmonary;  Laterality: N/A;  . ESOPHAGOGASTRODUODENOSCOPY  10/2010   nl esophagus, HH, portal hypertensive gastropathy, erosive gastropathy, Hpylori + (Dr. Sydnee Levans)  . ESOPHAGOGASTRODUODENOSCOPY (EGD) WITH PROPOFOL N/A 04/11/2018   Procedure: ESOPHAGOGASTRODUODENOSCOPY (EGD) WITH PROPOFOL;  Surgeon: Tol[place], Benay Pike, MD;  Location: [facility] ENDOSCOPY;  Service: Gastroenterology;  Laterality: N/A;  . foot surgery Bilateral 2017   Callus removal  . IR RADIOLOGIST EVAL & MGMT  02/19/2018  . left foot surgery  1990s  . LIVER BIOPSY    . THYROIDECTOMY  05/17/2011   Procedure: TOTAL THYROIDECTOMY;  Surgeon: Earnstine Regal, MD, benign goiter per path report    HEMATOLOGY/ONCOLOGY HISTORY:    Hepatocellular carcinoma ([place])   01/26/2018 Initial Diagnosis    Hepatocellular carcinoma ([place])    01/28/2018 Cancer Staging    Staging form: Liver, AJCC 8th Edition - Clinical stage from 01/28/2018: Stage IIIA (cT3, cN0, cM0) - Signed by Lloyd Huger, MD on 01/28/2018     ALLERGIES:  is allergic to demerol; other; penicillins; propofol; latex; and oxycodone hcl.  MEDICATIONS:  Current Facility-Administered Medications  Medication Dose Route Frequency Provider  Last Rate Last Dose  . diphenhydrAMINE (BENADRYL) injection 25 mg  25 mg Intravenous Q4H PRN Wilhelmina Mcardle, MD      . fentaNYL (SUBLIMAZE) injection 25-100 mcg  25-100 mcg Intravenous Q30 min PRN Wilhelmina Mcardle, MD   100 mcg at 04/14/18 2152  . fentaNYL 2576mcg in NS 219mL (58mcg/ml) infusion-PREMIX  0-50 mcg/hr Intravenous Continuous Mayo, Pete Pelt, MD 3.5 mL/hr at 04/15/18 0600 35 mcg/hr at 04/15/18 0600  .  glycopyrrolate (ROBINUL) tablet 1 mg  1 mg Oral Q4H PRN Wilhelmina Mcardle, MD       Or  . glycopyrrolate (ROBINUL) injection 0.2 mg  0.2 mg Subcutaneous Q4H PRN Wilhelmina Mcardle, MD       Or  . glycopyrrolate (ROBINUL) injection 0.2 mg  0.2 mg Intravenous Q4H PRN Wilhelmina Mcardle, MD   0.2 mg at 04/14/18 1210  . polyvinyl alcohol (LIQUIFILM TEARS) 1.4 % ophthalmic solution 1 drop  1 drop Both Eyes QID PRN Wilhelmina Mcardle, MD        VITAL SIGNS: BP (!) 122/50 (BP Location: Right Arm)   Pulse (!) 102   Temp 99.1 F (37.3 C) (Oral)   Resp 15   Ht 5\' 3"  (1.6 m)   Wt 192 lb 0.3 oz (87.1 kg)   SpO2 (!) 88%   BMI 34.01 kg/m  Filed Weights   04/12/18 1744 04/13/18 0400 04/14/18 0800  Weight: 189 lb 9.5 oz (86 kg) 178 lb 12.7 oz (81.1 kg) 192 lb 0.3 oz (87.1 kg)    Estimated body mass index is 34.01 kg/m as calculated from the following:   Height as of this encounter: 5\' 3"  (1.6 m).   Weight as of this encounter: 192 lb 0.3 oz (87.1 kg).  LABS: CBC:    Component Value Date/Time   WBC 12.2 (H) 04/13/2018 0441   HGB 8.1 (L) 04/13/2018 0441   HCT 26.0 (L) 04/13/2018 0441   PLT 121 (L) 04/13/2018 0441   MCV 91.5 04/13/2018 0441   NEUTROABS 8.5 (H) 04/13/2018 0441   LYMPHSABS 0.8 04/13/2018 0441   MONOABS 2.2 (H) 04/13/2018 0441   EOSABS 0.2 04/13/2018 0441   BASOSABS 0.0 04/13/2018 0441   Comprehensive Metabolic Panel:    Component Value Date/Time   NA 145 04/14/2018 0610   NA 142 09/26/2011   K 4.0 04/14/2018 0610   CL 121 (H) 04/14/2018 0610   CO2 20 (L) 04/14/2018 0610   BUN 46 (H) 04/14/2018 0610   BUN 11 09/26/2011   CREATININE 0.93 04/14/2018 0610   CREATININE 0.65 01/27/2015 1719   GLUCOSE 126 (H) 04/14/2018 0610   CALCIUM 5.8 (LL) 04/14/2018 0610   AST 408 (H) 04/13/2018 0441   AST 137 09/26/2011   ALT 247 (H) 04/13/2018 0441   ALKPHOS 646 (H) 04/13/2018 0441   ALKPHOS 74 09/26/2011   BILITOT 5.9 (H) 04/13/2018 0441   BILITOT 1.2 09/26/2011   PROT 5.6 (L)  04/13/2018 0441   PROT 7.8 05/22/2017 1054   ALBUMIN 1.9 (L) 04/13/2018 0441   ALBUMIN 4.1 09/26/2011    RADIOGRAPHIC STUDIES: Ct Abdomen Pelvis Wo Contrast  Result Date: 04/04/2018 CLINICAL DATA:  reports vomiting dark red blood, states four episodes today. Denies hx of same, denies blood thinners, denies alcohol use. Pt tachypneic, appears short of breath. Pt is a cancer patient, currently being treated for liver cancer. Pt abdomen very taut and distended. EXAM: CT ABDOMEN AND PELVIS WITHOUT CONTRAST TECHNIQUE: Multidetector CT imaging of the abdomen  and pelvis was performed following the standard protocol without IV contrast. COMPARISON:  PET-CT, 01/31/2018 FINDINGS: Lower chest: No acute findings. Hepatobiliary: Liver shows extensive nodularity. There is enlargement of the inferior aspect of the right lobe and lateral segment of the left lobe. Subtle hypoattenuating areas are noted in the liver, poorly defined on this unenhanced study, consistent with the known multifocal hepatocellular carcinoma. Gallbladder is unremarkable.  No bile duct dilation. Pancreas: Unremarkable. No pancreatic ductal dilatation or surrounding inflammatory changes. Spleen: Spleen borderline enlarged measuring 12.3 cm in greatest dimension, stable from the prior PET-CT. No splenic mass or focal lesion. Adrenals/Urinary Tract: No adrenal masses. Kidneys normal size, orientation and position. No renal masses or stones. No hydronephrosis. Ureters normal in course and in caliber. Bladder is unremarkable. Stomach/Bowel: Small hiatal hernia. Stomach is otherwise unremarkable. Small bowel is normal in caliber with no wall thickening or adjacent inflammation. Colon is normal in caliber. No wall thickening or convincing inflammation. Vascular/Lymphatic: There are numerous venous collaterals in the upper abdomen, with prominent paraesophageal varices. These are stable from the prior PET-CT. There are dense atherosclerotic calcifications  along a normal caliber abdominal aorta. There are multiple prominent and enlarged upper abdominal lymph nodes along the peri celiac and gastrohepatic ligaments, stable from the prior PET-CT. Reproductive: Uterus and bilateral adnexa are unremarkable. Other: There is now a small amount of ascites collecting adjacent to the liver, and tracking into the pelvis collecting in the pelvic recesses. Musculoskeletal: No fracture or acute finding. No osteoblastic or osteolytic lesions. IMPRESSION: 1. No acute findings within the abdomen or pelvis. 2. Advanced cirrhosis with portal venous hypertension. The multifocal hepatocellular carcinoma noted on the prior PET-CT is not well-defined on this unenhanced study, but shows no convincing change. 3. Prominent and enlarged upper abdominal lymph nodes, stable from the prior PET-CT. 4. Since the prior PET-CT, a small amount of ascites has developed. Electronically Signed   By: Lajean Manes M.D.   On: 04/15/2018 15:33   Dg Chest 1 View  Result Date: 04/10/2018 CLINICAL DATA:  Central line placement EXAM: CHEST  1 VIEW COMPARISON:  04/19/2018 at 1920 hours FINDINGS: Right IJ venous catheter terminates in the mid SVC. Endotracheal tube terminates 4 cm above the carina. Mild hazy lingular opacity, new. No pleural effusion or pneumothorax. The heart is normal in size. IMPRESSION: Right IJ venous catheter terminates in the mid SVC. Electronically Signed   By: Julian Hy M.D.   On: 04/10/2018 00:17   Dg Chest 2 View  Result Date: 03/21/2018 CLINICAL DATA:  Community acquired pneumonia. EXAM: CHEST - 2 VIEW COMPARISON:  Chest x-ray July 15, 2017, PET-CT January 31, 2018 FINDINGS: The heart size and mediastinal contours are within normal limits. There is a 2 cm nodule in the left upper lobe described on previous PET-CT. There is no focal pneumonia, pulmonary edema, or pleural effusion. The visualized skeletal structures are unremarkable. IMPRESSION: 2 cm nodule in the left  upper lobe described on the recent previous PET-CT. Neoplasm is not excluded. No focal pneumonia is identified. Electronically Signed   By: Abelardo Diesel M.D.   On: 03/21/2018 19:18   Dg Abd 1 View  Result Date: 03/21/2018 CLINICAL DATA:  Abdominal pain, distention, nausea, vomiting x3-4 months, worse x2-3 days. Hx of hepatitis, GERD, HTN. EXAM: ABDOMEN - 1 VIEW COMPARISON:  PET-CT on 01/31/2018 FINDINGS: Spleen is enlarged. Bowel gas pattern is nonobstructive. No suspicious lytic or blastic lesions are identified. IMPRESSION: Nonobstructed bowel-gas pattern. Splenomegaly. Electronically Signed  By: Nolon Nations M.D.   On: 03/21/2018 14:05   US Abdomen Limited  Result Date: 03/22/2018 CLINICAL DATA:  63 year old with history of hepatitis-C, cirrhosis and hepatocellular carcinoma. Evaluate for ascites and spontaneous bacterial peritonitis. EXAM: LIMITED ABDOMEN ULTRASOUND FOR ASCITES TECHNIQUE: Limited ultrasound survey for ascites was performed in all four abdominal quadrants. COMPARISON:  PET-CT 01/31/2018 FINDINGS: Spleen is prominent for size without surrounding fluid. There is no ascites identified in the left upper quadrant, left lower quadrant or right lower quadrant. Trace perihepatic ascites near the dome. Liver is heterogeneous and compatible with cirrhosis. IMPRESSION: No significant ascites. Trace ascites around the liver. Paracentesis not performed. Electronically Signed   By: Markus Daft M.D.   On: 03/22/2018 15:55   Dg Chest Port 1 View  Result Date: 04/12/2018 CLINICAL DATA:  Evaluate for pneumonia. EXAM: PORTABLE CHEST 1 VIEW COMPARISON:  April 11, 2018 FINDINGS: The ETT terminates in good position, 5 cm above the carina. The right IJ is in good position in the SVC. No pneumothorax. No suspicious infiltrate to suggest pneumonia. No other interval change. IMPRESSION: 1. Support apparatus as above. 2. No convincing evidence of pneumonia. Mild atelectasis in the left base.  Electronically Signed   By: Dorise Bullion III M.D   On: 04/12/2018 13:45   Dg Chest Port 1 View  Result Date: 04/11/2018 CLINICAL DATA:  Acute respiratory failure EXAM: PORTABLE CHEST 1 VIEW COMPARISON:  04/10/2018 FINDINGS: Endotracheal tube terminates 4 cm above the carina. Right IJ venous catheter terminates in the lower SVC. Mild left lower lobe opacity, likely atelectasis. Right lung is clear. No pleural effusion or pneumothorax. The heart is normal in size. Surgical clips along the bilateral neck. IMPRESSION: Endotracheal tube terminates 4 cm above the carina. Mild left lower lobe opacity, likely atelectasis. Electronically Signed   By: Julian Hy M.D.   On: 04/11/2018 03:36   Portable Chest Xray  Result Date: 04/10/2018 CLINICAL DATA:  Endotracheal tube present EXAM: PORTABLE CHEST 1 VIEW COMPARISON:  Yesterday FINDINGS: Endotracheal tube tip between the clavicular heads and carina. Right IJ line with tip at the SVC. Retrocardiac airspace disease. No edema, effusion, or pneumothorax. Normal heart size. IMPRESSION: 1. Stable hardware positioning. 2. Retrocardiac atelectasis or infection. Electronically Signed   By: Monte Fantasia M.D.   On: 04/10/2018 06:28   Portable Chest X-ray  Result Date: 04/06/2018 CLINICAL DATA:  Evaluate endotracheal tube. EXAM: PORTABLE CHEST 1 VIEW COMPARISON:  03/21/2018 FINDINGS: Endotracheal tube tip projects approximately 4 mm above the Carina, pointed towards the right mainstem bronchus orifice. Cardiac silhouette is normal in size. No mediastinal or hilar masses. Lungs show chronically prominent bronchovascular markings. No evidence of pneumonia or pulmonary edema. No pleural effusion or pneumothorax. Multiple surgical vascular clips at the neck base consistent with a prior thyroidectomy. IMPRESSION: 1. Endotracheal tube tip projects low, only approximately 4 mm above the inferior margin of the Carina, angled towards the right mainstem bronchus. Consider  retracting 1 to 1.5 cm. 2. No acute cardiopulmonary disease. Electronically Signed   By: Lajean Manes M.D.   On: 04/24/2018 19:39   Dg Abd 2 Views  Result Date: 04/08/2018 CLINICAL DATA:  63 year old with hepatic cirrhosis and hepatocellular carcinoma due to chronic hepatitis C presenting with constipation for approximately 3 weeks. EXAM: ABDOMEN - 2 VIEW COMPARISON:  03/21/2018 and earlier, including PET-CT 01/31/2018. FINDINGS: Bowel gas pattern unremarkable without evidence of obstruction or significant ileus. No evidence of air-fluid levels or free intraperitoneal air on the ERECT  image. Expected stool burden in the colon. No visible urinary tract calculi. Splenomegaly, as the inferior tip of the spleen extends nearly to the pelvic brim. Degenerative changes involving the lumbar spine. IMPRESSION: 1. No acute abdominal abnormality. 2. Splenomegaly, as the inferior tip of the spleen extends nearly to the pelvic brim. Electronically Signed   By: Evangeline Dakin M.D.   On: 04/08/2018 09:00    PERFORMANCE STATUS (ECOG) : 4 - Bedbound  Review of Systems Unable to provide.  Physical Exam General: critically ill appearing Pulmonary: unlabored GU: foley Skin: no rashes Neurological: unresponsive  IMPRESSION: Patient is comfortable appearing on fentanyl drip. I spoke with brother and sister who were at bedside. We discussed option of the [facility], which apparently one member of the family has requested. However, other family would prefer to leave her here. I suspect the burden of transfer would be high. Anticipate in hospital death.   PLAN: Comfort care Titrate fentanyl drip as needed   Time Total: 20 minutes  Visit consisted of counseling and education dealing with the complex and emotionally intense issues of symptom management and palliative care in the setting of serious and potentially life-threatening illness.Greater than 50%  of this time was spent counseling and coordinating  care related to the above assessment and plan.  Signed by: Altha Harm, PhD, NP-C [phone] (Work Cell)

## 2018-04-15 NOTE — Progress Notes (Signed)
Nutrition Brief Note  Chart reviewed. Patient has now transitioned to comfort care.   No further nutrition interventions warranted at this time. Please consult RD as needed.   Willey Blade, MS, [place], LDN Office: [phone] Pager: [phone] After Hours/Weekend Pager: [phone]

## 2018-04-15 NOTE — Progress Notes (Signed)
Patient repositioned and mouth care.  Clarise Cruz, BSN

## 2018-04-15 NOTE — Progress Notes (Signed)
[facility] [place] NAME: [name]    MR#:  [id number]  DATE OF BIRTH:  04-13-1955  SUBJECTIVE:  CHIEF COMPLAINT:   Chief Complaint  Patient presents with  . GI Bleeding   -Patient terminally extubated yesterday and is on fentanyl drip. -On comfort care now  REVIEW OF SYSTEMS:  Review of Systems  Unable to perform ROS: Patient unresponsive    DRUG ALLERGIES:   Allergies  Allergen Reactions  . Demerol Anaphylaxis  . Other Other (See Comments)    general anesthesia - heart stopped   . Penicillins Anaphylaxis, Hives and Itching    Has patient had a PCN reaction causing immediate rash, facial/tongue/throat swelling, SOB or lightheadedness with hypotension: Yes Has patient had a PCN reaction causing severe rash involving mucus membranes or skin necrosis: Yes Has patient had a PCN reaction that required hospitalization: Yes Has patient had a PCN reaction occurring within the last 10 years: No If all of the above answers are "NO", then may proceed with Cephalosporin use.   Marland Kitchen Propofol Other (See Comments)    Allergic, caused her to code   . Latex Rash  . Oxycodone Hcl Rash    VITALS:  Blood pressure (!) 124/56, pulse 93, temperature (!) 97.5 F (36.4 C), temperature source Tympanic, resp. rate (!) 21, height 5\' 3"  (1.6 m), weight 87.1 kg, SpO2 97 %.  PHYSICAL EXAMINATION:  Physical Exam  GENERAL:  63 y.o.-year-old patient lying in the bed with no acute distress.  EYES: Pupils equal, round, reactive to light and accommodation.  Positive for scleral icterus. Extraocular muscles intact.  HEENT: Head atraumatic, normocephalic. Oropharynx and nasopharynx clear.  NECK:  Supple, no jugular venous distention. No thyroid enlargement, no tenderness.  LUNGS: Normal breath sounds bilaterally, no wheezing, rales,rhonchi or crepitation. No use of accessory muscles of respiration.  Decreased breath sounds at the bases CARDIOVASCULAR: S1,  S2 normal. No  rubs, or gallops.  2/6 systolic murmur is present ABDOMEN: Soft, nontender, nondistended. Bowel sounds present. No organomegaly or mass.  EXTREMITIES: No pedal edema, cyanosis, or clubbing.  NEUROLOGIC: Patient is sedated.  Not responding at this time.  PSYCHIATRIC: The patient is sedated SKIN: No obvious rash, lesion, or ulcer.    LABORATORY PANEL:   CBC Recent Labs  Lab 04/13/18 0441  WBC 12.2*  HGB 8.1*  HCT 26.0*  PLT 121*   ------------------------------------------------------------------------------------------------------------------  Chemistries  Recent Labs  Lab 04/13/18 0441 04/14/18 0610  NA 140 145  K 4.1 4.0  CL 117* 121*  CO2 20* 20*  GLUCOSE 131* 126*  BUN 44* 46*  CREATININE 1.06* 0.93  CALCIUM 5.6* 5.8*  MG  --  2.3  AST 408*  --   ALT 247*  --   ALKPHOS 646*  --   BILITOT 5.9*  --    ------------------------------------------------------------------------------------------------------------------  Cardiac Enzymes Recent Labs  Lab 03/26/2018 2340  TROPONINI <0.03   ------------------------------------------------------------------------------------------------------------------  RADIOLOGY:  No results found.  EKG:   Orders placed or performed during the hospital encounter of 04/08/2018  . ED EKG  . ED EKG  . EKG 12-Lead  . EKG 12-Lead    ASSESSMENT AND PLAN:   63 year old female with past medical history significant for hepatitis C, liver cirrhosis, stage IV hepatocellular carcinoma, history of esophageal varices, bipolar, ongoing alcohol use presents to hospital secondary to hematemesis.  Was intubated in the ICU, no clinical improvement noted.  After palliative care input, family decided to  terminally extubate the patient.  Currently on fentanyl drip and is on comfort measures.   Following are her diagnosis.  1.  Hematemesis secondary to esophageal varices--GI bleed, seen by GI and patient had EGD on admission.   Showing actively bleeding esophageal varices that were banded and bleeding was stopped. -She received Protonix and octreotide drip while in the ICU  2.  Acute respiratory failure-hypoxic respiratory failure secondary to aspiration pneumonia -Patient was intubated in the ICU, terminally extubated on 04/14/2018  3.  Septic shock-secondary to aspiration pneumonia -Echocardiogram with normal EF of 55 to 65%-HQ systolic or diastolic dysfunction noted -off Levophed.  Currently on comfort care, off antibiotics  4.  Acute alcoholic liver failure--Has underlying hepatocellular carcinoma,  alcoholic liver cirrhosis.  5.  Acute encephalopathy-likely metabolic encephalopathy on admission  6.  Hepatocellular carcinoma-appreciate oncology input.  Due to elevated AFP  7.  Acute renal insufficiency-secondary to ATN from sepsis -Continue to monitor urine output.  8.    Severe hypocalcemia.   Patient currently on comfort care.  Updated brother at bedside     All the records are reviewed and case discussed with Care Management/Social Workerr. Management plans discussed with the patient, family and they are in agreement.  CODE STATUS: DNR  TOTAL TIME TAKING CARE OF THIS PATIENT: 22 minutes.   POSSIBLE D/C IN ? DAYS, DEPENDING ON CLINICAL CONDITION.   Gladstone Lighter M.D on 04/15/2018 at 11:45 AM  Between 7am to 6pm - Pager - [phone]  After 6pm go to [email or web address] - password [email or web address]  Office  [phone]  CC: Primary care physician; Jinny Sanders, MD

## 2018-04-17 ENCOUNTER — Other Ambulatory Visit: Payer: Medicare HMO

## 2018-04-17 ENCOUNTER — Ambulatory Visit: Payer: Medicare HMO

## 2018-04-18 ENCOUNTER — Ambulatory Visit: Payer: Medicare HMO | Admitting: Oncology

## 2018-04-22 ENCOUNTER — Ambulatory Visit (HOSPITAL_COMMUNITY): Payer: Medicare HMO

## 2018-04-22 ENCOUNTER — Other Ambulatory Visit (HOSPITAL_COMMUNITY): Payer: Medicare HMO

## 2018-04-26 NOTE — Discharge Summary (Signed)
[name] at [name] NAME: [name]    MR#:  [id number]  DATE OF BIRTH:  10/09/1955  DATE OF ADMISSION:  03/31/2018   ADMITTING PHYSICIAN: Monica Mango, MD  DATE OF DEATH: 29-Apr-2018  PRIMARY CARE PHYSICIAN: Monica Sanders, MD   ADMISSION DIAGNOSIS:  Hyponatremia [E87.1] Upper GI bleed [K92.2] Cirrhosis of liver with ascites, unspecified hepatic cirrhosis type ([place]) [K74.60, R18.8] Hematemesis [K92.0]  SECONDARY DIAGNOSIS:   Past Medical History:  Diagnosis Date  . Acquired hypothyroidism    thyroid goiter s/p thyroidectomy  . Allergy   . Bipolar 1 disorder ([place]) 1990s  . Chronic hepatitis C (HCC)    genotype 1a  . Compensated HCV cirrhosis ([place])   . Complication of anesthesia    states 15 yrs ago, hard time waking up, states cant breathe when coming out of anesthesia  . Complication of anesthesia    per patient heart stopped  . COPD (chronic obstructive pulmonary disease) ([place])   . Depression    states seen weekly at [facility]  . GERD (gastroesophageal reflux disease)    Hpylori + and treated (2012)  . Hepatitis C   . History of alcohol abuse   . Insomnia   . Migraines   . Pneumonia   . PONV (postoperative nausea and vomiting)    "it all depends on what kind they give me"  . Portal hypertension ([place])   . Portal hypertensive gastropathy ([place]) 2013  . Smoker   . Stroke ([place])   . TIA (transient ischemic attack)   . Varicose veins    HOSPITAL COURSE:   [name] is a 63 year old female with a past medical history of liver cirrhosis secondary to hepatitis C, esophageal varices, stage IV hepatocellular cancer who presented to the ED with hematemesis and coffee-ground emesis.  She was started on PPI and octreotide drip.  She was seen by GI and had an urgent EGD done 04/01/2018 which showed grade 1 varices and active bleeding in the distal esophagus.  Three bands were successfully placed with complete eradication.  She  remained mechanically intubated after her endoscopy.  Patient's sisters met with the palliative care team, and the decision was made to change patient to a DNR.  On 04/14/2018, patient's family decided they would like to proceed with palliative extubation.  She was started on full comfort medications.  Patient passed away on 04/29/18.  CONSULTS OBTAINED:  Treatment Team:  Monica Huger, MD [name], Monica Pelt, MD DRUG ALLERGIES:   Allergies  Allergen Reactions  . Demerol Anaphylaxis  . Other Other (See Comments)    general anesthesia - heart stopped   . Penicillins Anaphylaxis, Hives and Itching    Has patient had a PCN reaction causing immediate rash, facial/tongue/throat swelling, SOB or lightheadedness with hypotension: Yes Has patient had a PCN reaction causing severe rash involving mucus membranes or skin necrosis: Yes Has patient had a PCN reaction that required hospitalization: Yes Has patient had a PCN reaction occurring within the last 10 years: No If all of the above answers are "NO", then may proceed with Cephalosporin use.   Marland Kitchen Propofol Other (See Comments)    Allergic, caused her to code   . Latex Rash  . Oxycodone Hcl Rash   DISCHARGE CONDITION:  Expired  If you experience worsening of your admission symptoms, develop shortness of breath, life threatening emergency, suicidal or homicidal thoughts you must seek medical attention immediately by calling 911 or  calling your MD immediately  if symptoms less severe.  You Must read complete instructions/literature along with all the possible adverse reactions/side effects for all the Medicines you take and that have been prescribed to you. Take any new Medicines after you have completely understood and accpet all the possible adverse reactions/side effects.   Please note  You were cared for by a hospitalist during your hospital stay. If you have any questions about your discharge medications or the care you received while  you were in the hospital after you are discharged, you can call the unit and asked to speak with the hospitalist on call if the hospitalist that took care of you is not available. Once you are discharged, your primary care physician will handle any further medical issues. Please note that NO REFILLS for any discharge medications will be authorized once you are discharged, as it is imperative that you return to your primary care physician (or establish a relationship with a primary care physician if you do not have one) for your aftercare needs so that they can reassess your need for medications and monitor your lab values.    On the day of death:  VITAL SIGNS:  Blood pressure (!) 93/37, pulse (!) 109, temperature 98.4 F (36.9 C), temperature source Oral, resp. rate (!) 24, height '5\' 3"'  (1.6 m), weight 87.1 kg, SpO2 (!) 75 %. PHYSICAL EXAMINATION:  GENERAL:  63 y.o.-year-old patient lying in the bed with no acute distress. Appears comfortable. Sleeping. EYES: Pupils equal, round, reactive to light and accommodation. No scleral icterus. HEENT: Head atraumatic, normocephalic.  NECK:  Supple, no jugular venous distention. No thyroid enlargement, no tenderness.  LUNGS: Normal breath sounds bilaterally, no wheezing, rales,rhonchi or crepitation. No use of accessory muscles of respiration.  CARDIOVASCULAR: S1, S2 normal. II/VI systolic murmur present. No rubs or gallops.  ABDOMEN: Soft, non-tender, non-distended. Bowel sounds present. No organomegaly or mass.  EXTREMITIES: No pedal edema, cyanosis, or clubbing.  NEUROLOGIC: Sleeping PSYCHIATRIC: Unable to assess SKIN: No obvious rash, lesion, or ulcer.  DATA REVIEW:   CBC Recent Labs  Lab 04/13/18 0441  WBC 12.2*  HGB 8.1*  HCT 26.0*  PLT 121*    Chemistries  Recent Labs  Lab 04/13/18 0441 04/14/18 0610  NA 140 145  K 4.1 4.0  CL 117* 121*  CO2 20* 20*  GLUCOSE 131* 126*  BUN 44* 46*  CREATININE 1.06* 0.93  CALCIUM 5.6* 5.8*    MG  --  2.3  AST 408*  --   ALT 247*  --   ALKPHOS 646*  --   BILITOT 5.9*  --      Microbiology Results  Results for orders placed or performed during the hospital encounter of 04/08/2018  MRSA PCR Screening     Status: None   Collection Time: 04/21/2018  7:25 PM  Result Value Ref Range Status   MRSA by PCR NEGATIVE NEGATIVE Final    Comment:        The GeneXpert MRSA Assay (FDA approved for NASAL specimens only), is one component of a comprehensive MRSA colonization surveillance program. It is not intended to diagnose MRSA infection nor to guide or monitor treatment for MRSA infections. Performed at [facility], [address] [place], [place] [address]   Urine Culture     Status: None   Collection Time: 04/12/2018 11:40 PM  Result Value Ref Range Status   Specimen Description   Final    URINE, RANDOM Performed at [facility], [address] [place], [place] [address]    Special Requests   Final    Normal Performed at [facility], [address], [place], [place] [address]    Culture   Final    NO GROWTH Performed at [facility], [place] [address], [place], [place] [address]    Report Status 04/11/2018 FINAL  Final  CULTURE, BLOOD (ROUTINE X 2) w Reflex to ID Panel     Status: None   Collection Time: 04/10/18  1:05 AM  Result Value Ref Range Status   Specimen Description BLOOD BLOOD LEFT HAND  Final   Special Requests   Final    BOTTLES DRAWN AEROBIC AND ANAEROBIC Blood Culture adequate volume   Culture   Final    NO GROWTH 5 DAYS Performed at [facility], [address] [place], [place] [address]    Report Status 04/15/2018 FINAL  Final  CULTURE, BLOOD (ROUTINE X 2) w Reflex to ID Panel     Status: None   Collection Time: 04/10/18  1:14 AM  Result Value Ref Range Status   Specimen Description BLOOD BLOOD LEFT FOREARM  Final   Special Requests   Final    BOTTLES DRAWN AEROBIC AND ANAEROBIC Blood Culture  adequate volume   Culture   Final    NO GROWTH 5 DAYS Performed at [facility], [address] [place], [place] [address]    Report Status 04/15/2018 FINAL  Final  Culture, respiratory (non-expectorated)     Status: None   Collection Time: 04/12/18 12:54 PM  Result Value Ref Range Status   Specimen Description   Final    TRACHEAL ASPIRATE Performed at [facility], [address], [place], [place] [address]    Special Requests   Final    NONE Performed at [facility], [address] [place], [place] [address]    Gram Stain   Final    NO WBC SEEN RARE YEAST Performed at [facility], [place] [address] [place], [place] [address]    Culture FEW CANDIDA ALBICANS  Final   Report Status 04/15/2018 FINAL  Final    RADIOLOGY:  No results found.   Management plans discussed with the patient, family and they are in agreement.  CODE STATUS: DNR   TOTAL TIME TAKING CARE OF THIS PATIENT: 40 minutes.    Berna Spare [name] M.D on 04/29/18 at 10:32 AM  Between 7am to 6pm - Pager - [phone]  After 6pm go to [email or web address] - Proofreader  [facility]  Office  [phone]  CC: Primary care physician; Monica Sanders, MD   Note: This dictation was prepared with Dragon dictation along with smaller phrase technology. Any transcriptional errors that result from this process are unintentional.

## 2018-04-26 NOTE — Progress Notes (Signed)
Patient repositioned and mouth care done. Clarise Cruz, BSN

## 2018-04-26 NOTE — Progress Notes (Signed)
Patient's TOD [id number].  Pronounced by Clarise Cruz, BSN and Edwin Dada, RN.  Made Dr. Brett Albino aware and Lelon Frohlich, [facility].  No family was present.  Notified patient's sister Carollee Leitz.  [facility]A made aware and has released patient to be transported to the funeral home.  Clarise Cruz, BSN

## 2018-04-26 DEATH — deceased

## 2018-05-13 ENCOUNTER — Other Ambulatory Visit: Payer: Medicare HMO

## 2018-05-13 ENCOUNTER — Ambulatory Visit: Payer: Medicare HMO

## 2018-05-15 ENCOUNTER — Ambulatory Visit: Payer: Medicare HMO | Admitting: Oncology

## 2018-05-20 ENCOUNTER — Ambulatory Visit (HOSPITAL_COMMUNITY): Payer: Medicare HMO

## 2018-05-20 ENCOUNTER — Other Ambulatory Visit (HOSPITAL_COMMUNITY): Payer: Medicare HMO

## 2018-06-20 ENCOUNTER — Ambulatory Visit: Payer: Medicare HMO

## 2018-06-24 ENCOUNTER — Encounter: Payer: Medicare HMO | Admitting: Family Medicine

## 2019-06-02 IMAGING — CR DG FOOT COMPLETE 3+V*R*
1 series · 3 of 3 positions shown · non-contrast
Comparison: 08/13/2013

CLINICAL DATA: Nonhealing wounds of bilateral feet.

EXAM:
RIGHT FOOT COMPLETE - 3+ VIEW

[Series 1: x foot ap right · 0.14mm/px · 3 of 3 slices shown]
[im 1/3]
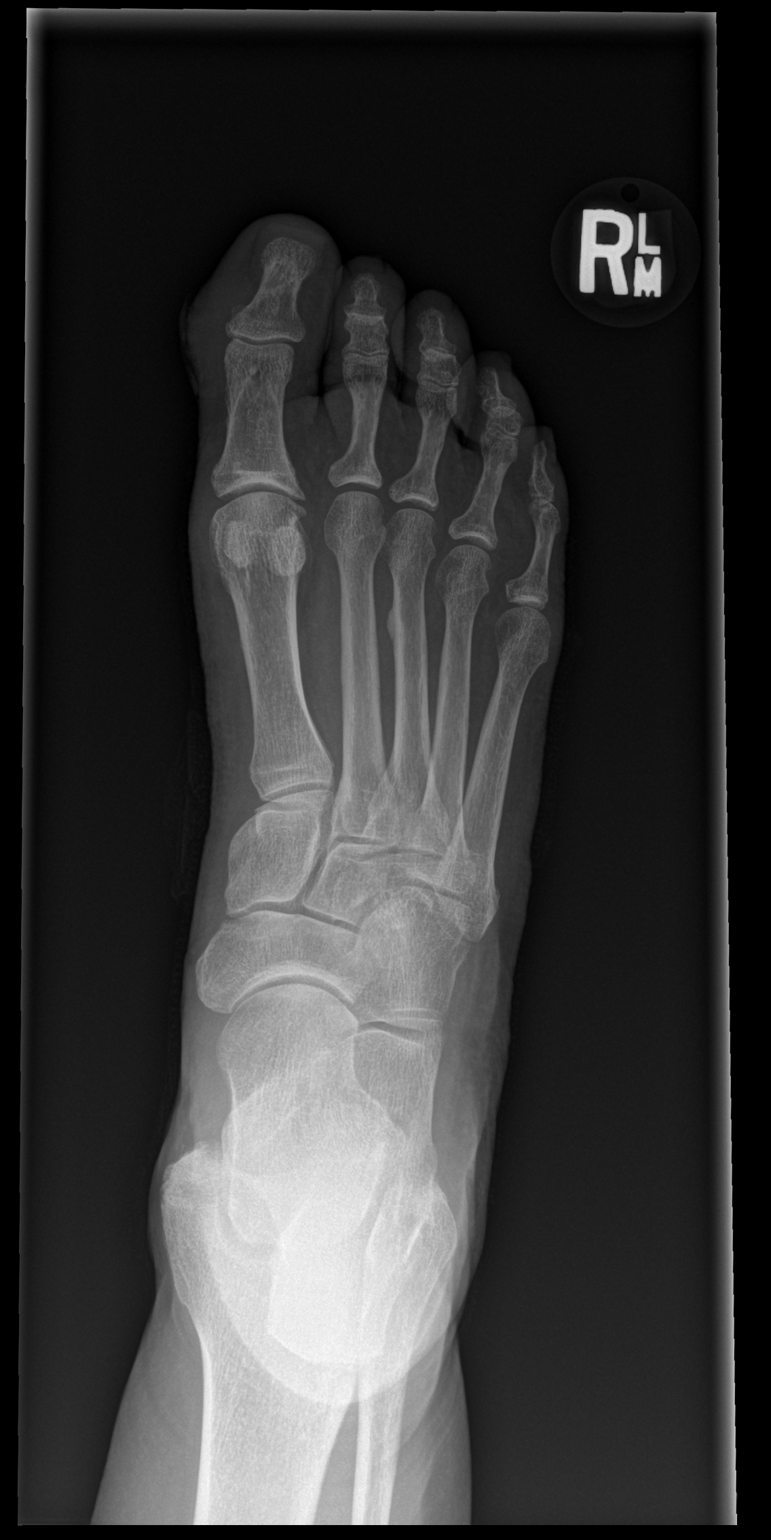
[im 2/3]
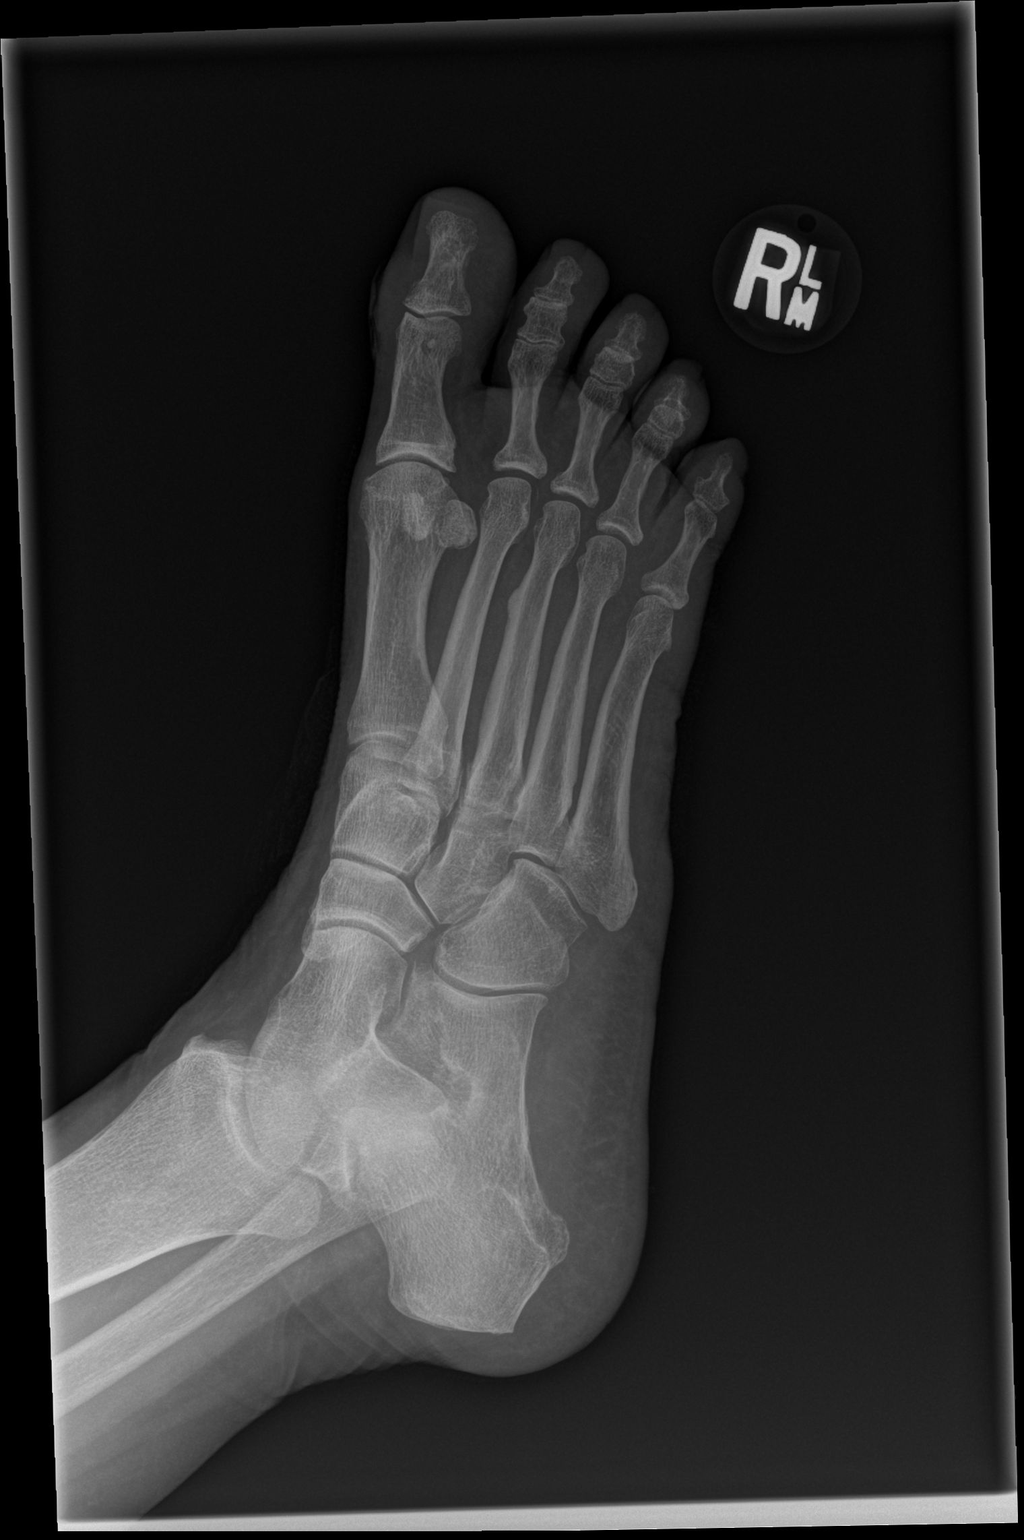
[im 3/3]
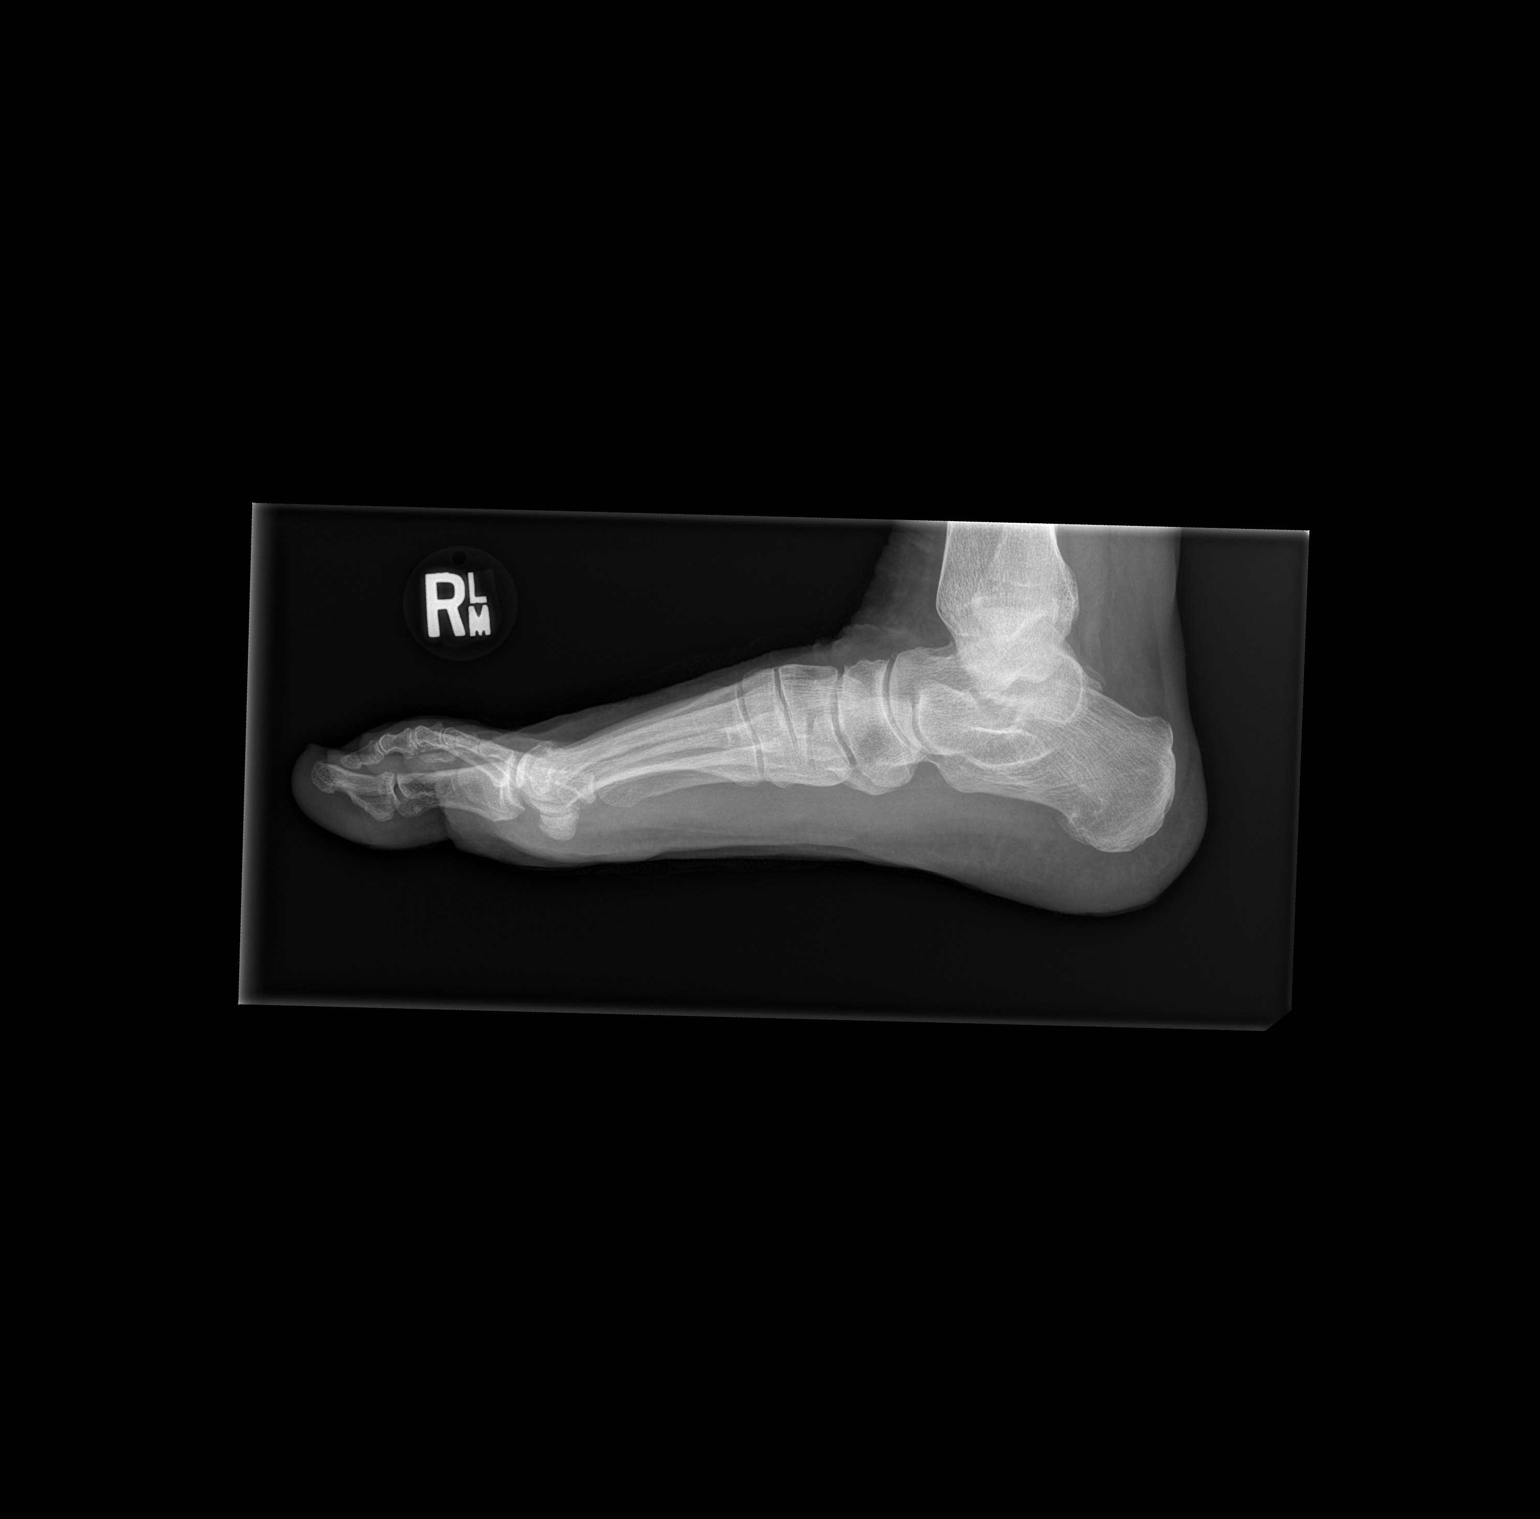

[3 of 3 positions shown; findings below may reference images not displayed]

FINDINGS: No acute bony abnormality. Specifically, no fracture, subluxation,
or dislocation. Soft tissues are intact. No radiographic changes of
osteomyelitis.
IMPRESSION: No acute bony abnormality.

## 2019-06-02 IMAGING — CR DG FOOT COMPLETE 3+V*L*
1 series · 3 of 3 positions shown · non-contrast
Comparison: 08/13/2013.

CLINICAL DATA: Nonhealing wound.

EXAM:
LEFT FOOT - COMPLETE 3+ VIEW

[Series 1: x foot ap left · 0.14mm/px · 3 of 3 slices shown]
[im 1/3]
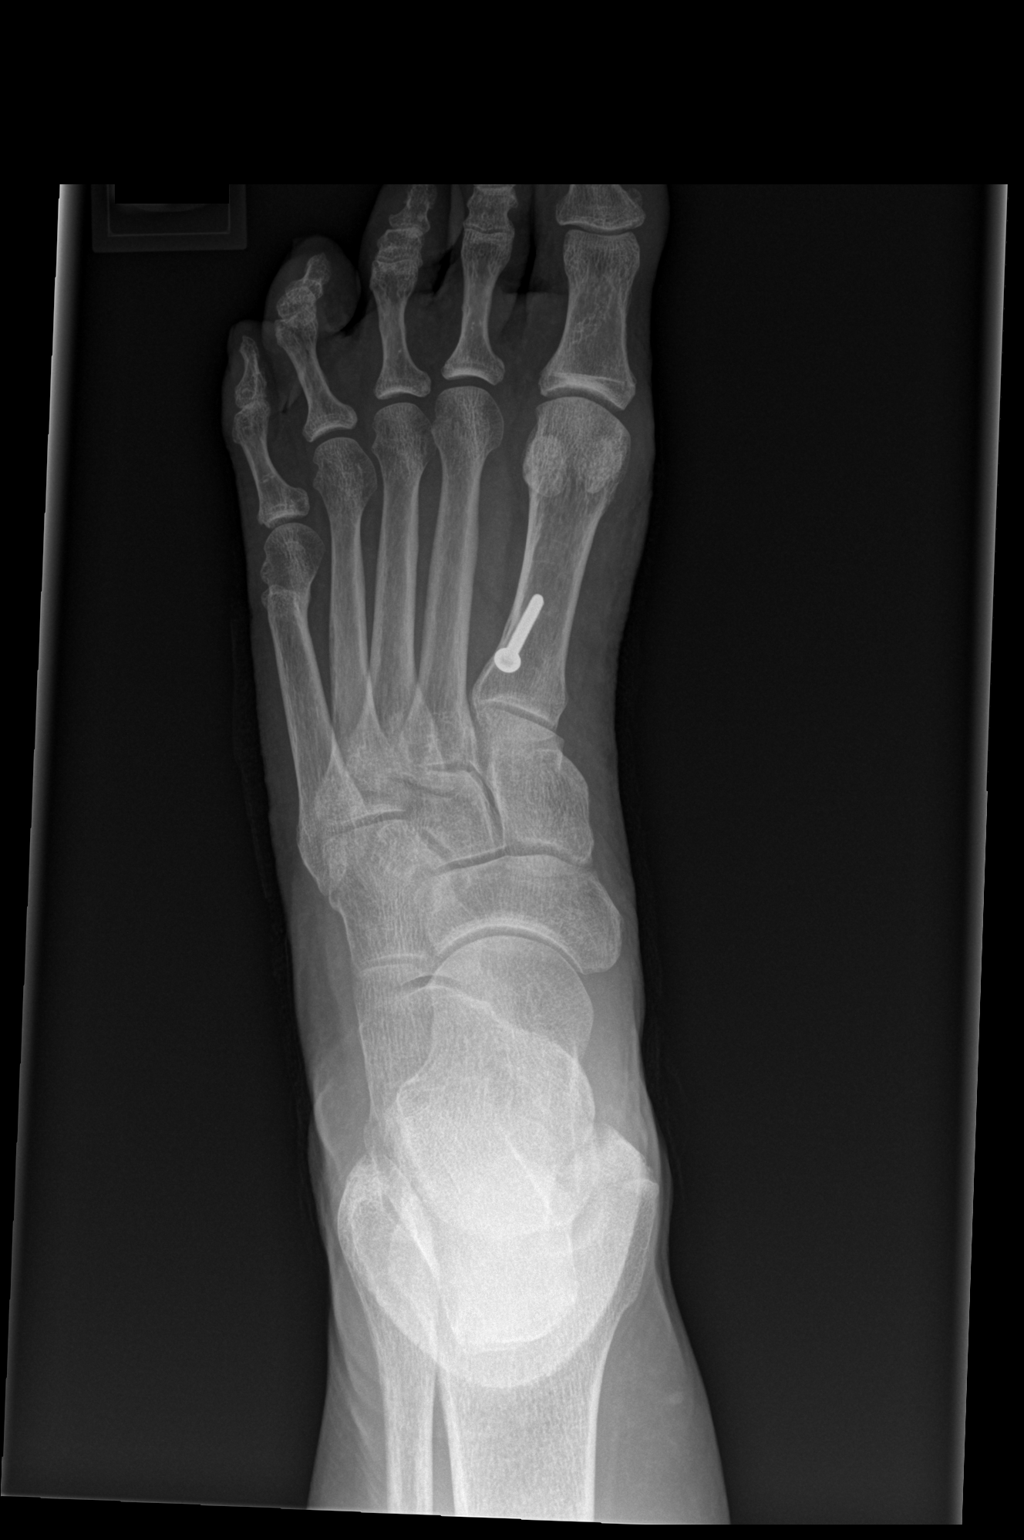
[im 2/3]
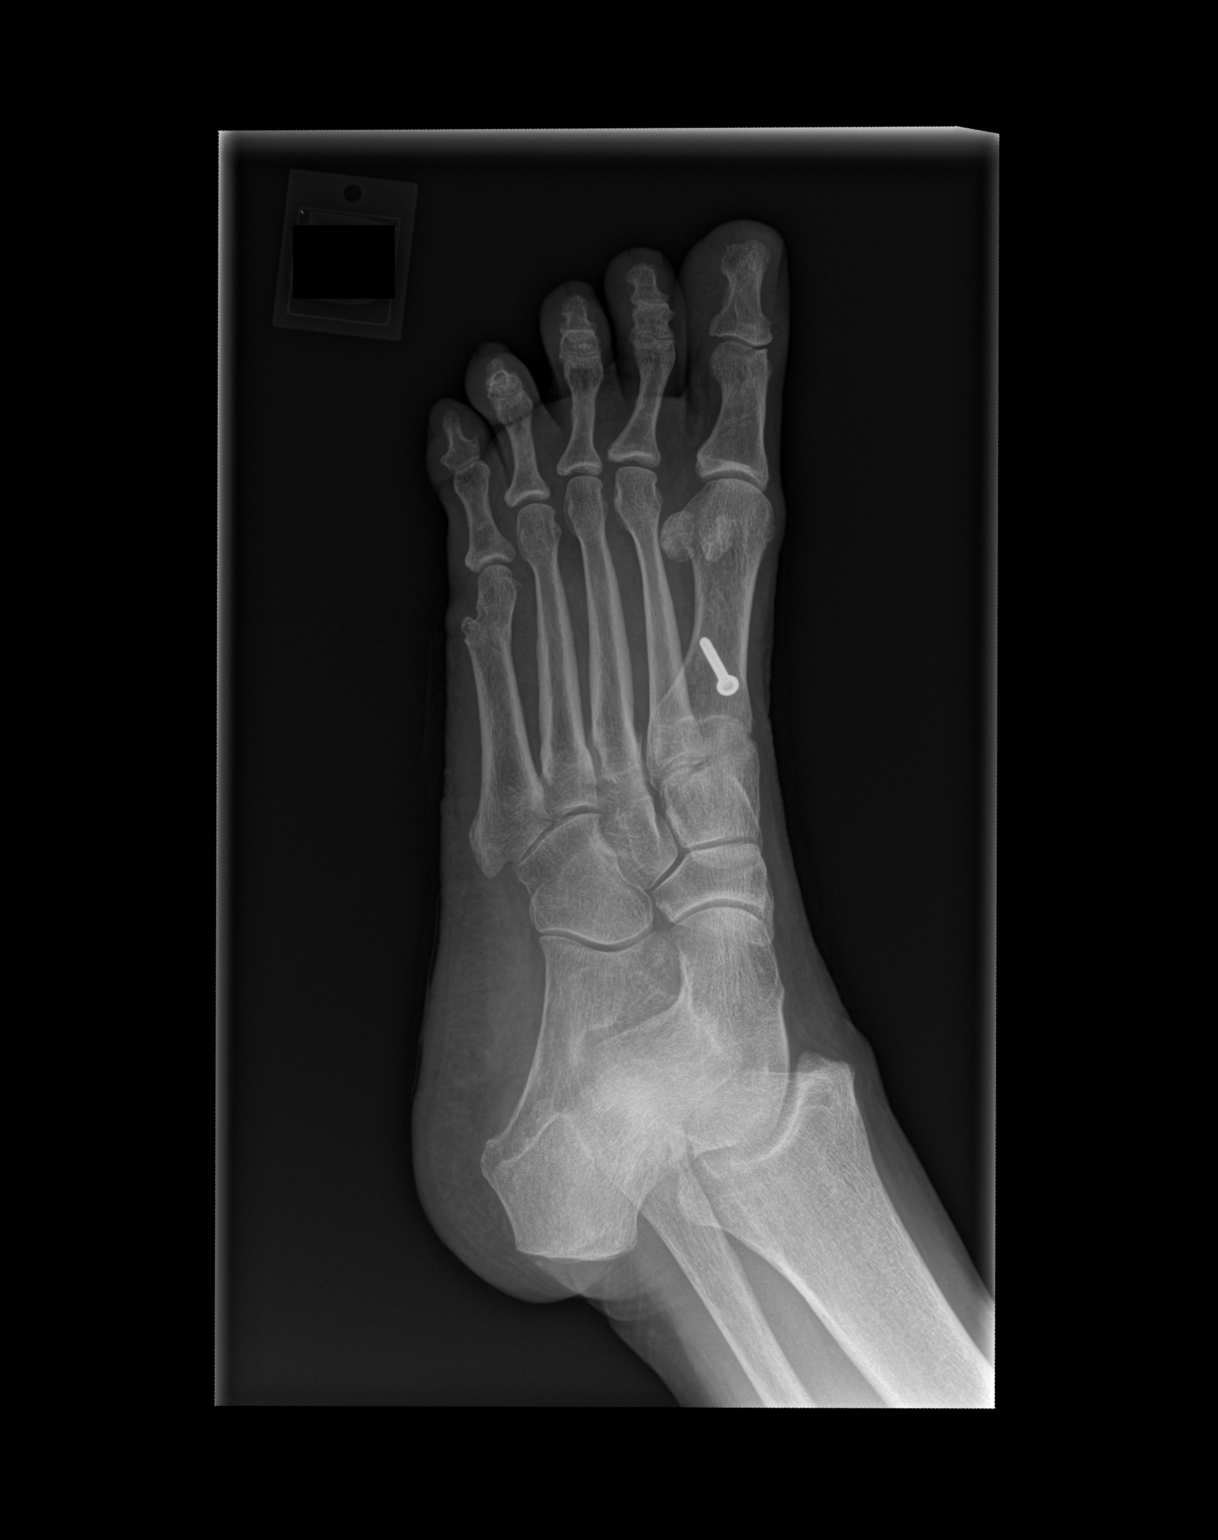
[im 3/3]
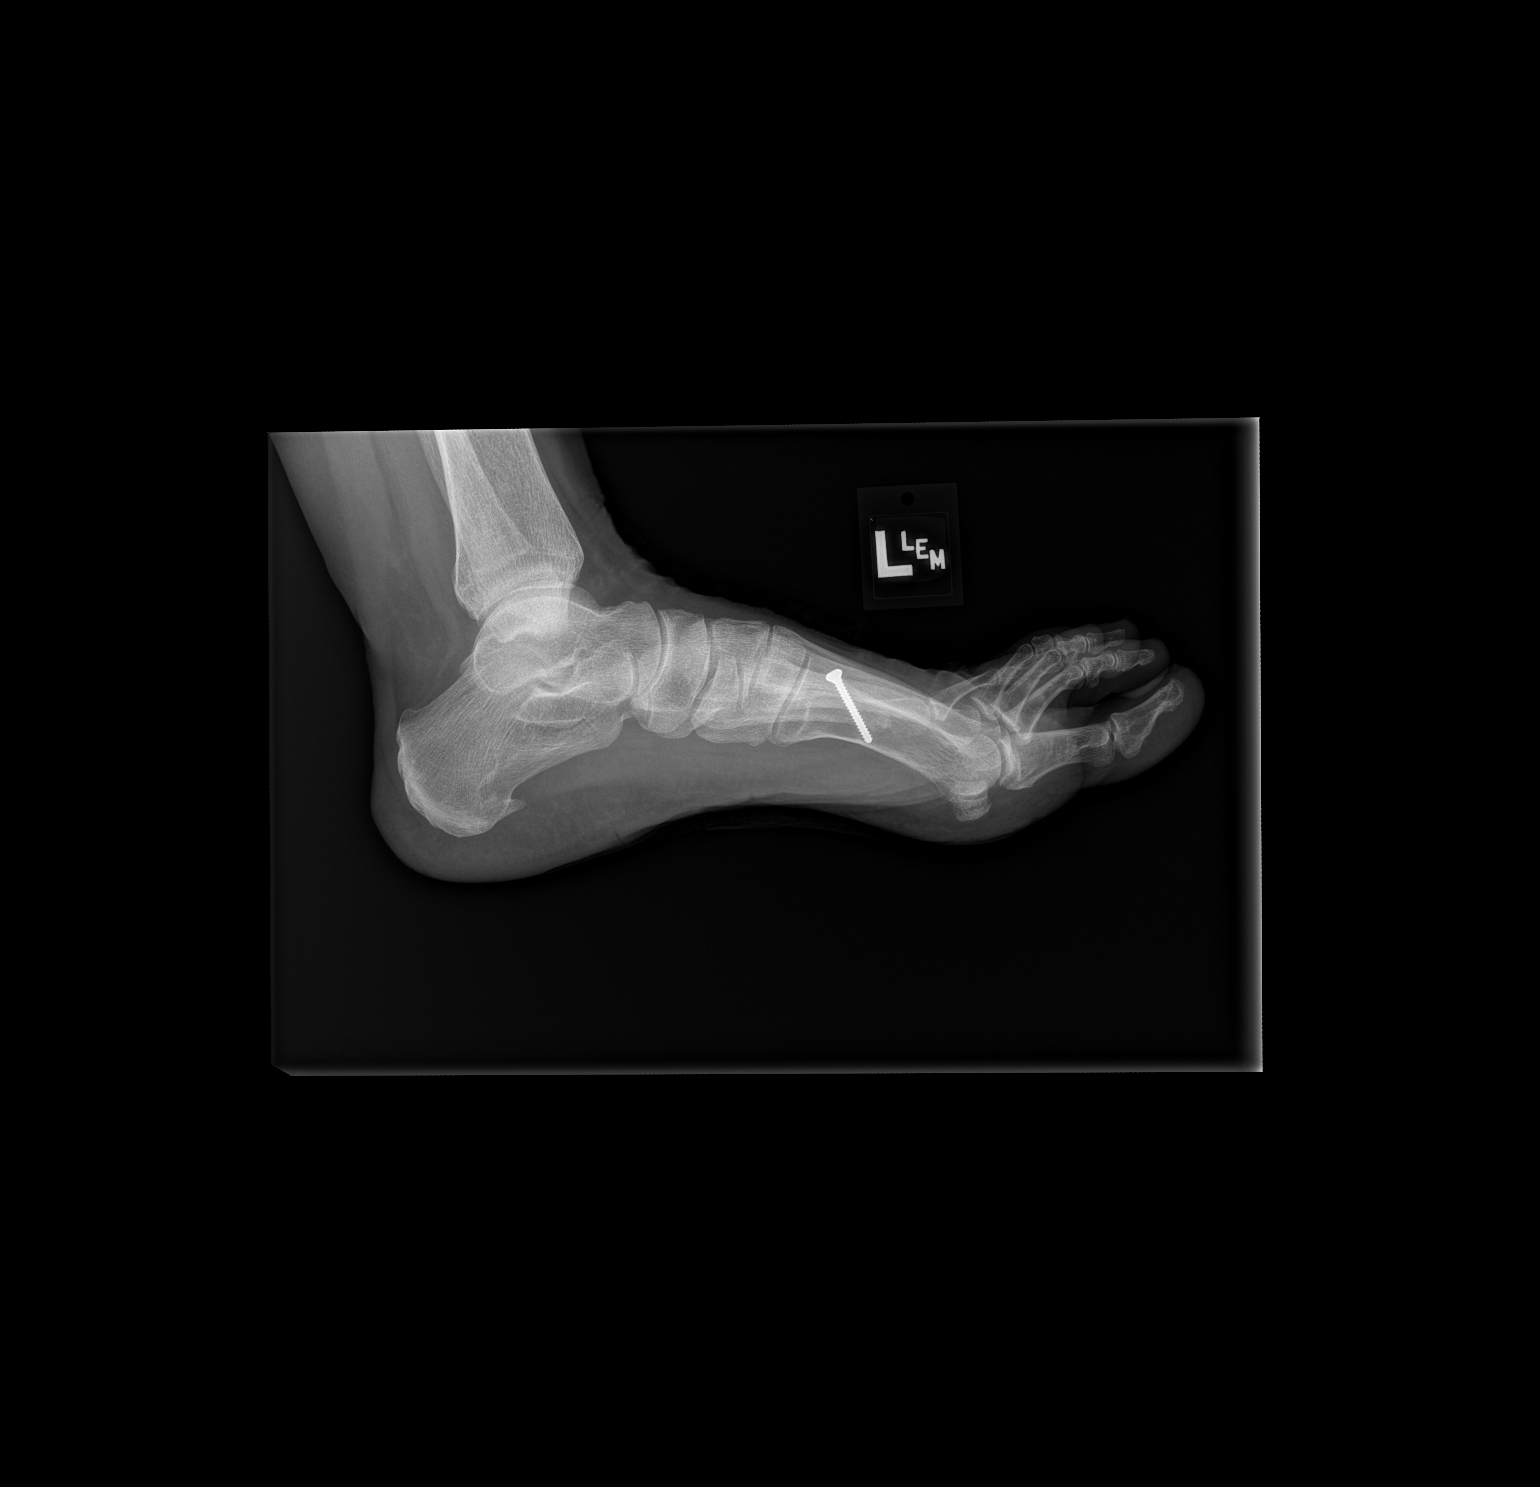

[3 of 3 positions shown; findings below may reference images not displayed]

FINDINGS: Surgical screw is noted in the left first tarsal. Screw is intact.
Deformity noted of the distal aspect of the left fifth metatarsal.
This is most likely an old healed fracture. Diffuse osteopenia and
degenerative change. No acute abnormality identified. Soft tissues
are unremarkable. No radiopaque foreign body.
IMPRESSION: 1. Postsurgical changes left first metatarsal. Hardware intact.
Deformity noted of the left fifth metatarsal consistent with old
healed fracture.

2. No acute abnormality.

## 2020-09-09 IMAGING — DX DG SHOULDER 2+V*L*
3 series · 3 of 3 positions shown · non-contrast
Comparison: PET-CT 01/31/2018.

CLINICAL DATA: Left arm pain.  No injury.

EXAM:
LEFT SHOULDER - 2+ VIEW

[shoulder axial]
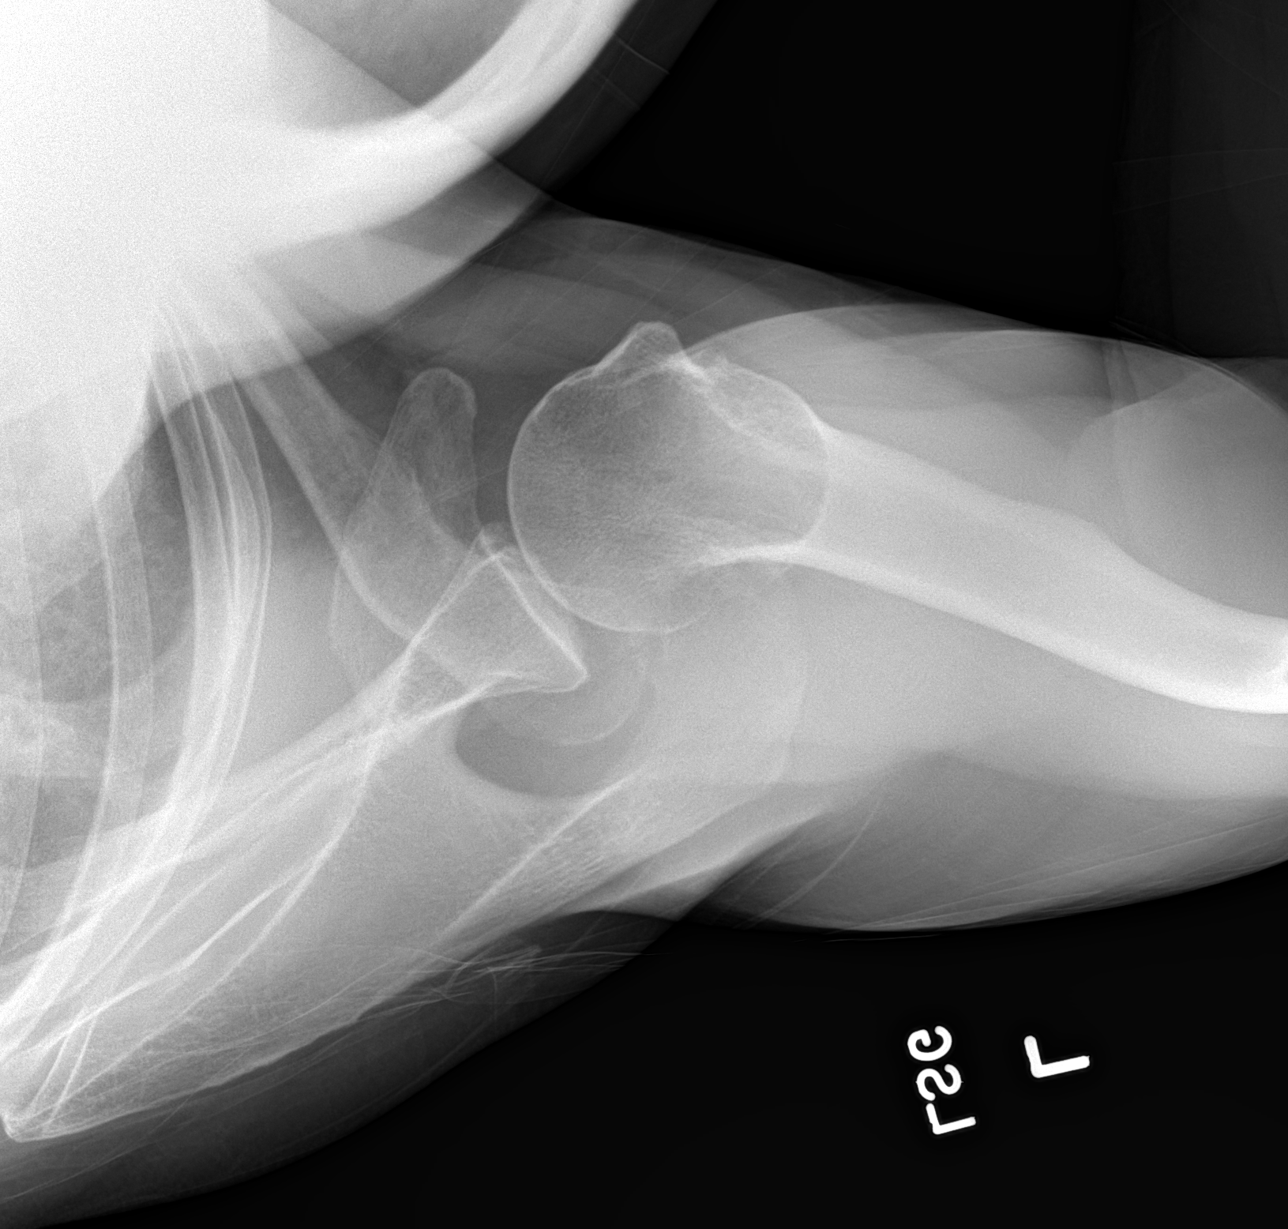

[shoulder ap]
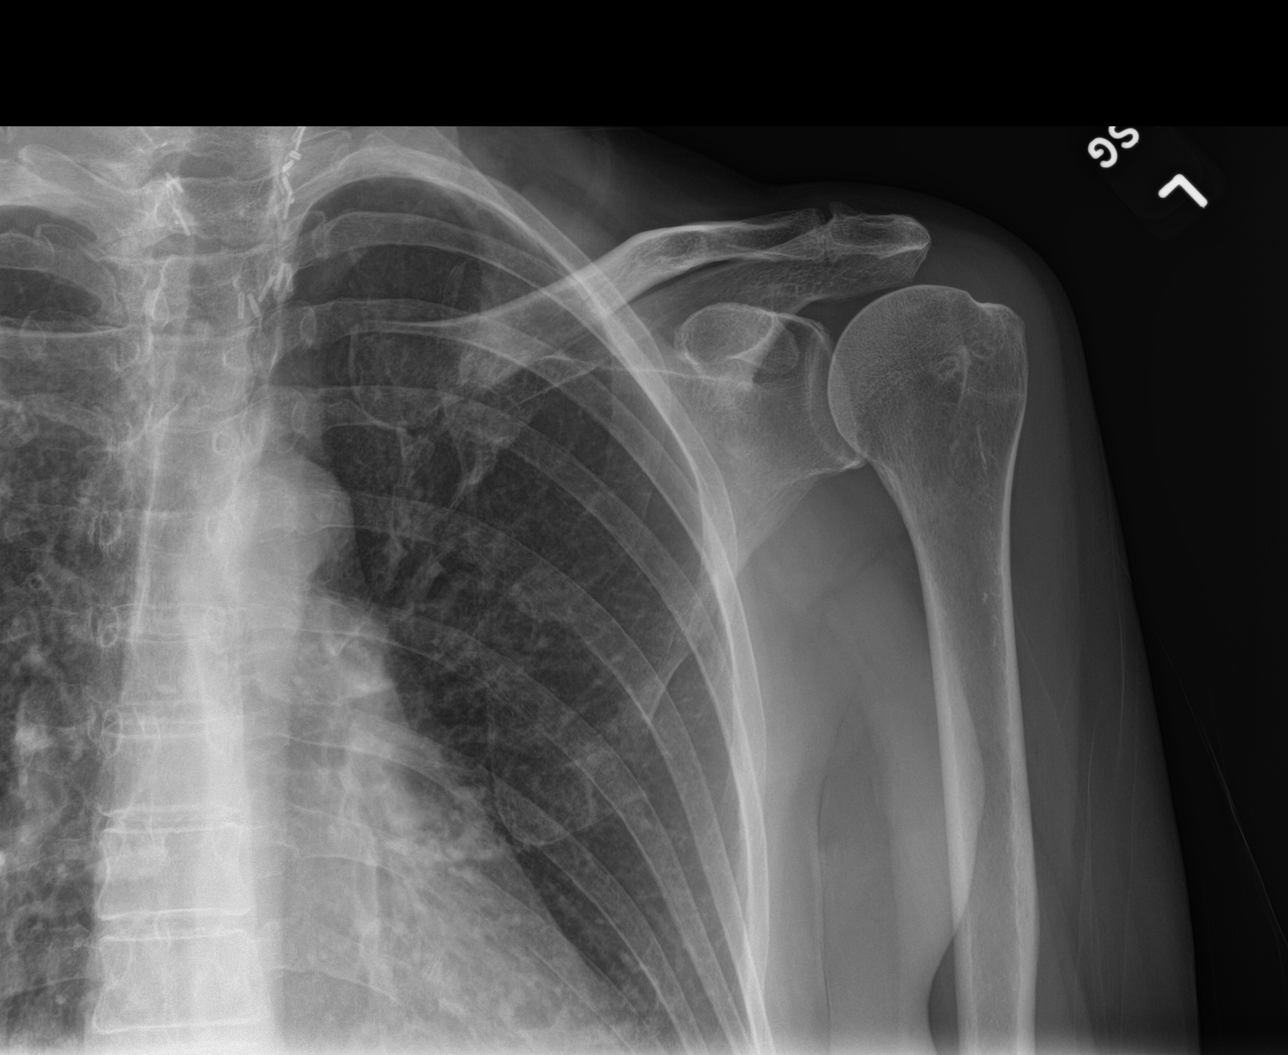

[shoulder y-view]
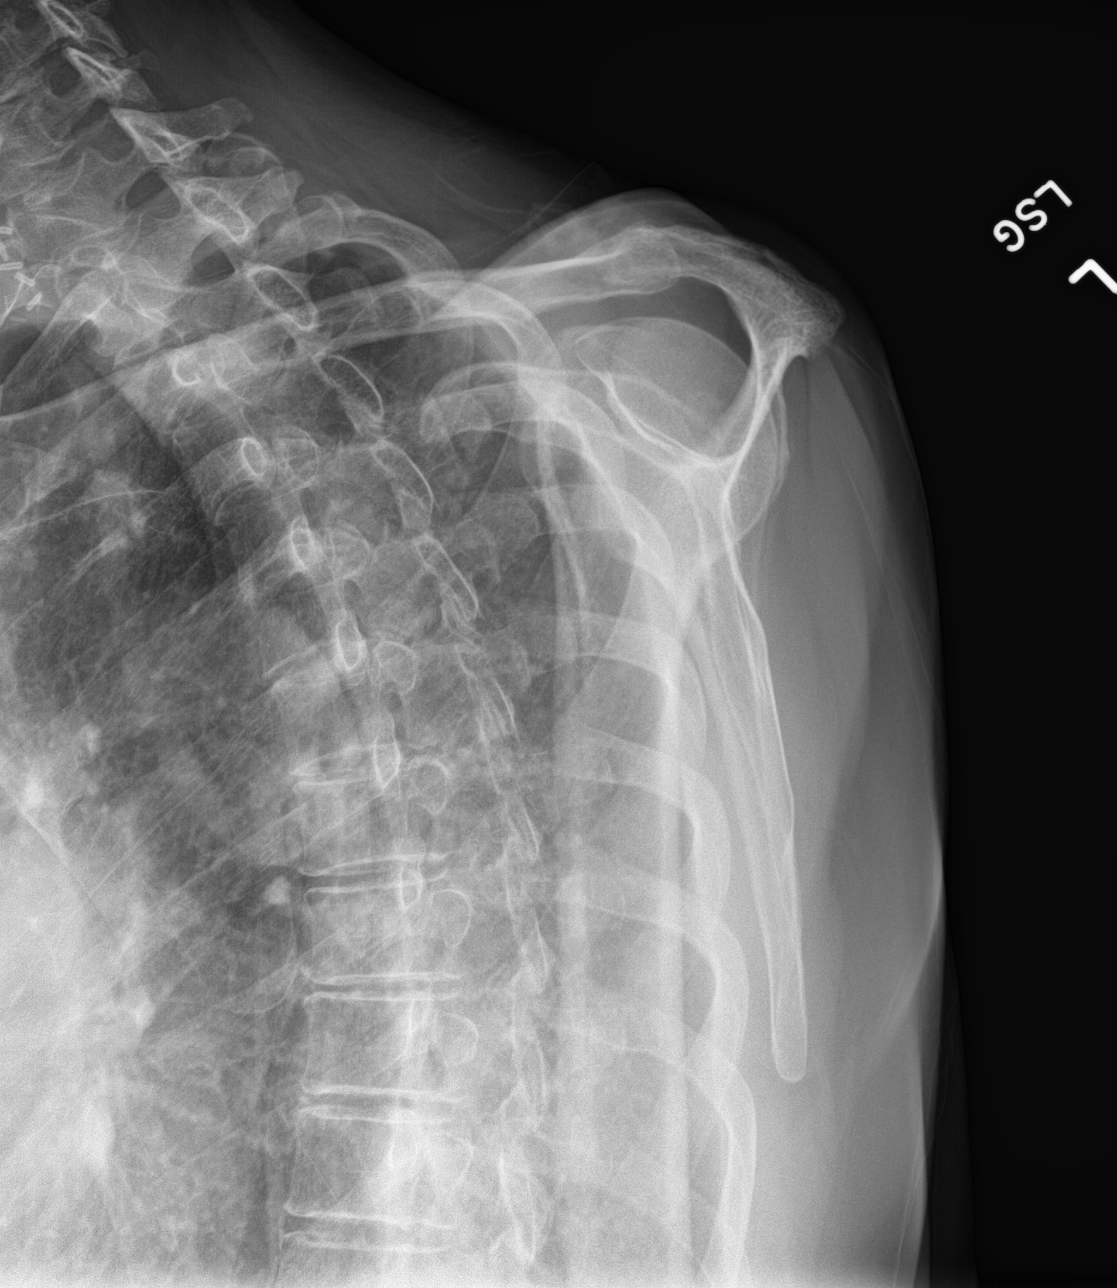

[3 of 3 positions shown; findings below may reference images not displayed]

FINDINGS: Acromioclavicular and glenohumeral degenerative change. No acute
bony or joint abnormality identified. No evidence of fracture or
dislocation. Previously identified PET positive nodular density in
the left upper lung is again noted. Surgical clips noted over the
neck.
IMPRESSION: 1. Acromioclavicular and glenohumeral degenerative change. No acute
bony abnormality identified. No evidence of fracture or dislocation.
2. Previously identified PET positive pulmonary nodule within the
left upper lung is again noted.
